# Patient Record
Sex: Female | Born: 1935
Health system: Southern US, Community
[De-identification: ages and names within clinical notes are randomized; demographics above are authoritative.]

## PROBLEM LIST (undated history)

## (undated) DIAGNOSIS — K219 Gastro-esophageal reflux disease without esophagitis: Secondary | ICD-10-CM

## (undated) DIAGNOSIS — R112 Nausea with vomiting, unspecified: Secondary | ICD-10-CM

## (undated) DIAGNOSIS — I4891 Unspecified atrial fibrillation: Secondary | ICD-10-CM

## (undated) DIAGNOSIS — N289 Disorder of kidney and ureter, unspecified: Secondary | ICD-10-CM

## (undated) DIAGNOSIS — C50919 Malignant neoplasm of unspecified site of unspecified female breast: Secondary | ICD-10-CM

## (undated) DIAGNOSIS — K625 Hemorrhage of anus and rectum: Secondary | ICD-10-CM

## (undated) DIAGNOSIS — I499 Cardiac arrhythmia, unspecified: Secondary | ICD-10-CM

## (undated) DIAGNOSIS — E785 Hyperlipidemia, unspecified: Secondary | ICD-10-CM

## (undated) DIAGNOSIS — Z7901 Long term (current) use of anticoagulants: Secondary | ICD-10-CM

## (undated) DIAGNOSIS — I509 Heart failure, unspecified: Secondary | ICD-10-CM

## (undated) DIAGNOSIS — G629 Polyneuropathy, unspecified: Secondary | ICD-10-CM

## (undated) DIAGNOSIS — A809 Acute poliomyelitis, unspecified: Secondary | ICD-10-CM

## (undated) DIAGNOSIS — F419 Anxiety disorder, unspecified: Secondary | ICD-10-CM

## (undated) DIAGNOSIS — I10 Essential (primary) hypertension: Secondary | ICD-10-CM

## (undated) DIAGNOSIS — IMO0001 Reserved for inherently not codable concepts without codable children: Secondary | ICD-10-CM

## (undated) DIAGNOSIS — Z9889 Other specified postprocedural states: Secondary | ICD-10-CM

## (undated) DIAGNOSIS — I2510 Atherosclerotic heart disease of native coronary artery without angina pectoris: Secondary | ICD-10-CM

## (undated) HISTORY — DX: Polyneuropathy, unspecified: G62.9

## (undated) HISTORY — DX: Unspecified atrial fibrillation: I48.91

## (undated) HISTORY — DX: Hemorrhage of anus and rectum: K62.5

## (undated) HISTORY — DX: Essential (primary) hypertension: I10

## (undated) HISTORY — PX: DILATION AND CURETTAGE OF UTERUS: SHX78

## (undated) HISTORY — PX: EYE SURGERY: SHX253

## (undated) HISTORY — PX: PORT-A-CATH REMOVAL: SHX5289

## (undated) HISTORY — DX: Gastro-esophageal reflux disease without esophagitis: K21.9

## (undated) HISTORY — DX: Atherosclerotic heart disease of native coronary artery without angina pectoris: I25.10

## (undated) HISTORY — DX: Anxiety disorder, unspecified: F41.9

## (undated) HISTORY — DX: Acute poliomyelitis, unspecified: A80.9

## (undated) HISTORY — DX: Malignant neoplasm of unspecified site of unspecified female breast: C50.919

## (undated) HISTORY — PX: BREAST SURGERY: SHX581

## (undated) HISTORY — DX: Hyperlipidemia, unspecified: E78.5

## (undated) HISTORY — DX: Long term (current) use of anticoagulants: Z79.01

---
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
## 1997-07-10 HISTORY — PX: CHOLECYSTECTOMY: SHX55

## 1997-10-27 ENCOUNTER — Other Ambulatory Visit: Admission: RE | Admit: 1997-10-27 | Discharge: 1997-10-27 | Payer: Self-pay | Admitting: Obstetrics and Gynecology

## 1997-12-24 ENCOUNTER — Observation Stay (HOSPITAL_COMMUNITY): Admission: RE | Admit: 1997-12-24 | Discharge: 1997-12-25 | Payer: Self-pay | Admitting: Surgery

## 1998-03-17 ENCOUNTER — Other Ambulatory Visit: Admission: RE | Admit: 1998-03-17 | Discharge: 1998-03-17 | Payer: Self-pay | Admitting: Urology

## 1998-03-30 ENCOUNTER — Ambulatory Visit (HOSPITAL_COMMUNITY): Admission: RE | Admit: 1998-03-30 | Discharge: 1998-03-30 | Payer: Self-pay | Admitting: Urology

## 1998-11-04 ENCOUNTER — Other Ambulatory Visit: Admission: RE | Admit: 1998-11-04 | Discharge: 1998-11-04 | Payer: Self-pay | Admitting: Obstetrics and Gynecology

## 1999-04-19 ENCOUNTER — Encounter: Admission: RE | Admit: 1999-04-19 | Discharge: 1999-04-19 | Payer: Self-pay | Admitting: Family Medicine

## 1999-07-06 ENCOUNTER — Encounter: Payer: Self-pay | Admitting: Internal Medicine

## 1999-07-06 ENCOUNTER — Ambulatory Visit (HOSPITAL_COMMUNITY): Admission: RE | Admit: 1999-07-06 | Discharge: 1999-07-06 | Payer: Self-pay | Admitting: Internal Medicine

## 1999-07-11 DIAGNOSIS — C50919 Malignant neoplasm of unspecified site of unspecified female breast: Secondary | ICD-10-CM

## 1999-07-11 HISTORY — PX: MASTECTOMY: SHX3

## 1999-07-11 HISTORY — DX: Malignant neoplasm of unspecified site of unspecified female breast: C50.919

## 1999-11-02 ENCOUNTER — Other Ambulatory Visit: Admission: RE | Admit: 1999-11-02 | Discharge: 1999-11-02 | Payer: Self-pay | Admitting: Obstetrics and Gynecology

## 2000-04-05 ENCOUNTER — Other Ambulatory Visit: Admission: RE | Admit: 2000-04-05 | Discharge: 2000-04-05 | Payer: Self-pay | Admitting: Surgery

## 2000-04-09 ENCOUNTER — Encounter: Payer: Self-pay | Admitting: Surgery

## 2000-04-09 ENCOUNTER — Encounter: Admission: RE | Admit: 2000-04-09 | Discharge: 2000-04-09 | Payer: Self-pay | Admitting: Surgery

## 2000-04-11 ENCOUNTER — Encounter: Admission: RE | Admit: 2000-04-11 | Discharge: 2000-04-11 | Payer: Self-pay | Admitting: Surgery

## 2000-04-11 ENCOUNTER — Encounter: Payer: Self-pay | Admitting: Surgery

## 2000-04-20 ENCOUNTER — Encounter: Payer: Self-pay | Admitting: Surgery

## 2000-04-20 ENCOUNTER — Ambulatory Visit (HOSPITAL_COMMUNITY): Admission: RE | Admit: 2000-04-20 | Discharge: 2000-04-20 | Payer: Self-pay | Admitting: Surgery

## 2000-04-26 ENCOUNTER — Ambulatory Visit (HOSPITAL_COMMUNITY): Admission: RE | Admit: 2000-04-26 | Discharge: 2000-04-26 | Payer: Self-pay | Admitting: Gastroenterology

## 2000-04-26 ENCOUNTER — Encounter (INDEPENDENT_AMBULATORY_CARE_PROVIDER_SITE_OTHER): Payer: Self-pay | Admitting: Specialist

## 2000-05-01 ENCOUNTER — Encounter: Admission: RE | Admit: 2000-05-01 | Discharge: 2000-05-01 | Payer: Self-pay | Admitting: Surgery

## 2000-05-01 ENCOUNTER — Encounter: Payer: Self-pay | Admitting: Surgery

## 2000-05-03 ENCOUNTER — Encounter: Admission: RE | Admit: 2000-05-03 | Discharge: 2000-05-03 | Payer: Self-pay | Admitting: Surgery

## 2000-05-03 ENCOUNTER — Encounter (INDEPENDENT_AMBULATORY_CARE_PROVIDER_SITE_OTHER): Payer: Self-pay | Admitting: *Deleted

## 2000-05-03 ENCOUNTER — Other Ambulatory Visit: Admission: RE | Admit: 2000-05-03 | Discharge: 2000-05-03 | Payer: Self-pay | Admitting: Surgery

## 2000-05-03 ENCOUNTER — Encounter: Payer: Self-pay | Admitting: Surgery

## 2000-05-18 ENCOUNTER — Other Ambulatory Visit: Admission: RE | Admit: 2000-05-18 | Discharge: 2000-05-18 | Payer: Self-pay | Admitting: Surgery

## 2000-05-18 ENCOUNTER — Encounter (INDEPENDENT_AMBULATORY_CARE_PROVIDER_SITE_OTHER): Payer: Self-pay | Admitting: Specialist

## 2000-05-18 ENCOUNTER — Encounter: Admission: RE | Admit: 2000-05-18 | Discharge: 2000-05-18 | Payer: Self-pay | Admitting: Surgery

## 2000-05-18 ENCOUNTER — Encounter: Payer: Self-pay | Admitting: Surgery

## 2000-05-23 ENCOUNTER — Encounter: Payer: Self-pay | Admitting: Surgery

## 2000-05-25 ENCOUNTER — Inpatient Hospital Stay (HOSPITAL_COMMUNITY): Admission: RE | Admit: 2000-05-25 | Discharge: 2000-05-27 | Payer: Self-pay | Admitting: Surgery

## 2000-05-25 ENCOUNTER — Encounter (INDEPENDENT_AMBULATORY_CARE_PROVIDER_SITE_OTHER): Payer: Self-pay | Admitting: Specialist

## 2000-06-12 ENCOUNTER — Ambulatory Visit (HOSPITAL_COMMUNITY): Admission: RE | Admit: 2000-06-12 | Discharge: 2000-06-12 | Payer: Self-pay | Admitting: *Deleted

## 2000-06-12 ENCOUNTER — Encounter: Payer: Self-pay | Admitting: *Deleted

## 2000-06-13 ENCOUNTER — Encounter: Admission: RE | Admit: 2000-06-13 | Discharge: 2000-06-13 | Payer: Self-pay | Admitting: *Deleted

## 2000-06-13 ENCOUNTER — Encounter: Payer: Self-pay | Admitting: *Deleted

## 2000-07-04 ENCOUNTER — Encounter: Payer: Self-pay | Admitting: Surgery

## 2000-07-04 ENCOUNTER — Ambulatory Visit (HOSPITAL_COMMUNITY): Admission: RE | Admit: 2000-07-04 | Discharge: 2000-07-04 | Payer: Self-pay | Admitting: Surgery

## 2000-08-28 ENCOUNTER — Encounter: Payer: Self-pay | Admitting: *Deleted

## 2000-08-28 ENCOUNTER — Encounter: Admission: RE | Admit: 2000-08-28 | Discharge: 2000-08-28 | Payer: Self-pay | Admitting: *Deleted

## 2000-09-04 ENCOUNTER — Encounter (HOSPITAL_COMMUNITY): Admission: RE | Admit: 2000-09-04 | Discharge: 2000-12-03 | Payer: Self-pay | Admitting: *Deleted

## 2000-10-25 ENCOUNTER — Encounter: Admission: RE | Admit: 2000-10-25 | Discharge: 2001-01-23 | Payer: Self-pay | Admitting: Radiation Oncology

## 2001-02-01 ENCOUNTER — Ambulatory Visit (HOSPITAL_BASED_OUTPATIENT_CLINIC_OR_DEPARTMENT_OTHER): Admission: RE | Admit: 2001-02-01 | Discharge: 2001-02-01 | Payer: Self-pay | Admitting: Surgery

## 2001-04-17 ENCOUNTER — Encounter: Payer: Self-pay | Admitting: *Deleted

## 2001-04-17 ENCOUNTER — Encounter: Admission: RE | Admit: 2001-04-17 | Discharge: 2001-04-17 | Payer: Self-pay | Admitting: *Deleted

## 2001-09-13 ENCOUNTER — Encounter: Payer: Self-pay | Admitting: *Deleted

## 2001-09-13 ENCOUNTER — Ambulatory Visit (HOSPITAL_COMMUNITY): Admission: RE | Admit: 2001-09-13 | Discharge: 2001-09-13 | Payer: Self-pay | Admitting: *Deleted

## 2001-12-04 ENCOUNTER — Other Ambulatory Visit: Admission: RE | Admit: 2001-12-04 | Discharge: 2001-12-04 | Payer: Self-pay | Admitting: Obstetrics and Gynecology

## 2002-02-27 ENCOUNTER — Encounter: Payer: Self-pay | Admitting: Radiation Oncology

## 2002-02-27 ENCOUNTER — Ambulatory Visit (HOSPITAL_COMMUNITY): Admission: RE | Admit: 2002-02-27 | Discharge: 2002-02-27 | Payer: Self-pay | Admitting: Radiation Oncology

## 2002-03-11 ENCOUNTER — Ambulatory Visit (HOSPITAL_COMMUNITY): Admission: RE | Admit: 2002-03-11 | Discharge: 2002-03-11 | Payer: Self-pay | Admitting: *Deleted

## 2002-03-11 ENCOUNTER — Encounter: Payer: Self-pay | Admitting: *Deleted

## 2002-03-21 ENCOUNTER — Encounter: Payer: Self-pay | Admitting: *Deleted

## 2002-03-21 ENCOUNTER — Ambulatory Visit (HOSPITAL_COMMUNITY): Admission: RE | Admit: 2002-03-21 | Discharge: 2002-03-21 | Payer: Self-pay | Admitting: *Deleted

## 2002-04-14 ENCOUNTER — Encounter: Admission: RE | Admit: 2002-04-14 | Discharge: 2002-04-14 | Payer: Self-pay | Admitting: *Deleted

## 2002-04-14 ENCOUNTER — Encounter: Payer: Self-pay | Admitting: *Deleted

## 2003-04-22 ENCOUNTER — Encounter: Payer: Self-pay | Admitting: Oncology

## 2003-04-22 ENCOUNTER — Encounter: Admission: RE | Admit: 2003-04-22 | Discharge: 2003-04-22 | Payer: Self-pay | Admitting: Oncology

## 2003-05-05 ENCOUNTER — Ambulatory Visit (HOSPITAL_COMMUNITY): Admission: RE | Admit: 2003-05-05 | Discharge: 2003-05-05 | Payer: Self-pay | Admitting: Oncology

## 2003-09-29 ENCOUNTER — Other Ambulatory Visit: Admission: RE | Admit: 2003-09-29 | Discharge: 2003-09-29 | Payer: Self-pay | Admitting: Obstetrics and Gynecology

## 2003-10-21 ENCOUNTER — Ambulatory Visit (HOSPITAL_COMMUNITY): Admission: RE | Admit: 2003-10-21 | Discharge: 2003-10-21 | Payer: Self-pay | Admitting: Gastroenterology

## 2003-10-21 ENCOUNTER — Encounter (INDEPENDENT_AMBULATORY_CARE_PROVIDER_SITE_OTHER): Payer: Self-pay | Admitting: *Deleted

## 2004-04-22 ENCOUNTER — Encounter: Admission: RE | Admit: 2004-04-22 | Discharge: 2004-04-22 | Payer: Self-pay | Admitting: Oncology

## 2004-07-07 ENCOUNTER — Ambulatory Visit: Payer: Self-pay | Admitting: Internal Medicine

## 2004-08-30 ENCOUNTER — Ambulatory Visit: Payer: Self-pay | Admitting: Internal Medicine

## 2004-09-07 ENCOUNTER — Ambulatory Visit: Payer: Self-pay | Admitting: Internal Medicine

## 2004-09-13 ENCOUNTER — Ambulatory Visit: Payer: Self-pay | Admitting: Internal Medicine

## 2004-10-24 ENCOUNTER — Ambulatory Visit: Payer: Self-pay | Admitting: Internal Medicine

## 2004-10-28 ENCOUNTER — Ambulatory Visit: Payer: Self-pay | Admitting: Oncology

## 2004-11-02 ENCOUNTER — Encounter: Admission: RE | Admit: 2004-11-02 | Discharge: 2004-11-02 | Payer: Self-pay | Admitting: Surgery

## 2004-11-04 ENCOUNTER — Ambulatory Visit: Payer: Self-pay | Admitting: Internal Medicine

## 2004-11-10 ENCOUNTER — Encounter: Admission: RE | Admit: 2004-11-10 | Discharge: 2004-11-10 | Payer: Self-pay | Admitting: Surgery

## 2004-12-19 ENCOUNTER — Ambulatory Visit: Payer: Self-pay | Admitting: Oncology

## 2004-12-26 ENCOUNTER — Other Ambulatory Visit: Admission: RE | Admit: 2004-12-26 | Discharge: 2004-12-26 | Payer: Self-pay | Admitting: Dermatology

## 2005-06-14 ENCOUNTER — Ambulatory Visit: Payer: Self-pay | Admitting: Oncology

## 2005-06-14 ENCOUNTER — Ambulatory Visit: Payer: Self-pay | Admitting: Internal Medicine

## 2005-07-04 ENCOUNTER — Ambulatory Visit: Payer: Self-pay | Admitting: Internal Medicine

## 2005-07-20 ENCOUNTER — Ambulatory Visit (HOSPITAL_BASED_OUTPATIENT_CLINIC_OR_DEPARTMENT_OTHER): Admission: RE | Admit: 2005-07-20 | Discharge: 2005-07-20 | Payer: Self-pay | Admitting: Surgery

## 2005-07-20 ENCOUNTER — Encounter (INDEPENDENT_AMBULATORY_CARE_PROVIDER_SITE_OTHER): Payer: Self-pay | Admitting: *Deleted

## 2005-08-15 ENCOUNTER — Ambulatory Visit: Payer: Self-pay | Admitting: Oncology

## 2005-08-29 ENCOUNTER — Ambulatory Visit: Payer: Self-pay | Admitting: Cardiology

## 2005-08-29 ENCOUNTER — Ambulatory Visit: Payer: Self-pay | Admitting: Internal Medicine

## 2005-08-29 ENCOUNTER — Inpatient Hospital Stay (HOSPITAL_COMMUNITY): Admission: EM | Admit: 2005-08-29 | Discharge: 2005-09-01 | Payer: Self-pay | Admitting: *Deleted

## 2005-09-13 ENCOUNTER — Ambulatory Visit: Payer: Self-pay | Admitting: Internal Medicine

## 2005-09-14 ENCOUNTER — Ambulatory Visit: Payer: Self-pay | Admitting: Internal Medicine

## 2005-10-20 ENCOUNTER — Other Ambulatory Visit: Admission: RE | Admit: 2005-10-20 | Discharge: 2005-10-20 | Payer: Self-pay | Admitting: Obstetrics and Gynecology

## 2005-11-06 ENCOUNTER — Encounter: Admission: RE | Admit: 2005-11-06 | Discharge: 2005-11-06 | Payer: Self-pay | Admitting: Oncology

## 2005-11-07 ENCOUNTER — Encounter (INDEPENDENT_AMBULATORY_CARE_PROVIDER_SITE_OTHER): Payer: Self-pay | Admitting: *Deleted

## 2005-11-07 LAB — CONVERTED CEMR LAB

## 2005-11-09 ENCOUNTER — Encounter: Admission: RE | Admit: 2005-11-09 | Discharge: 2005-11-09 | Payer: Self-pay | Admitting: Oncology

## 2006-02-06 ENCOUNTER — Ambulatory Visit: Payer: Self-pay | Admitting: Oncology

## 2006-02-07 LAB — COMPREHENSIVE METABOLIC PANEL
AST: 21 U/L (ref 0–37)
Alkaline Phosphatase: 99 U/L (ref 39–117)
BUN: 20 mg/dL (ref 6–23)
Calcium: 9.6 mg/dL (ref 8.4–10.5)
Chloride: 103 mEq/L (ref 96–112)
Creatinine, Ser: 0.85 mg/dL (ref 0.40–1.20)

## 2006-02-07 LAB — CBC WITH DIFFERENTIAL/PLATELET
Basophils Absolute: 0 10*3/uL (ref 0.0–0.1)
EOS%: 1.9 % (ref 0.0–7.0)
HCT: 40.1 % (ref 34.8–46.6)
HGB: 13.6 g/dL (ref 11.6–15.9)
MCH: 28.7 pg (ref 26.0–34.0)
MCV: 84.9 fL (ref 81.0–101.0)
MONO%: 8.7 % (ref 0.0–13.0)
NEUT%: 53.5 % (ref 39.6–76.8)

## 2006-03-21 ENCOUNTER — Ambulatory Visit: Payer: Self-pay | Admitting: Internal Medicine

## 2006-03-24 ENCOUNTER — Ambulatory Visit (HOSPITAL_COMMUNITY): Admission: RE | Admit: 2006-03-24 | Discharge: 2006-03-24 | Payer: Self-pay | Admitting: Internal Medicine

## 2006-09-24 ENCOUNTER — Ambulatory Visit: Payer: Self-pay | Admitting: Internal Medicine

## 2006-09-24 LAB — CONVERTED CEMR LAB
ALT: 16 units/L (ref 0–40)
AST: 25 units/L (ref 0–37)
BUN: 12 mg/dL (ref 6–23)
Basophils Absolute: 0.1 10*3/uL (ref 0.0–0.1)
Basophils Relative: 1.1 % — ABNORMAL HIGH (ref 0.0–1.0)
CO2: 30 meq/L (ref 19–32)
Calcium: 9.4 mg/dL (ref 8.4–10.5)
Chloride: 100 meq/L (ref 96–112)
Cholesterol: 248 mg/dL (ref 0–200)
Creatinine, Ser: 0.7 mg/dL (ref 0.4–1.2)
Direct LDL: 144.6 mg/dL
Eosinophils Absolute: 0.1 10*3/uL (ref 0.0–0.6)
Eosinophils Relative: 1.5 % (ref 0.0–5.0)
GFR calc Af Amer: 106 mL/min
GFR calc non Af Amer: 87 mL/min
Glucose, Bld: 95 mg/dL (ref 70–99)
HCT: 42.5 % (ref 36.0–46.0)
HDL: 66.7 mg/dL (ref >39.0)
Hemoglobin: 14.4 g/dL (ref 12.0–15.0)
Lymphocytes Relative: 34 % (ref 12.0–46.0)
MCHC: 33.9 g/dL (ref 30.0–36.0)
MCV: 83.5 fL (ref 78.0–100.0)
Monocytes Absolute: 0.6 10*3/uL (ref 0.2–0.7)
Monocytes Relative: 8.1 % (ref 3.0–11.0)
Neutro Abs: 4.1 10*3/uL (ref 1.4–7.7)
Neutrophils Relative %: 55.3 % (ref 43.0–77.0)
Platelets: 310 10*3/uL (ref 150–400)
Potassium: 3.8 meq/L (ref 3.5–5.1)
RBC: 5.1 M/uL (ref 3.87–5.11)
RDW: 13.3 % (ref 11.5–14.6)
Sodium: 138 meq/L (ref 135–145)
TSH: 2.36 microintl units/mL (ref 0.35–5.50)
Total CHOL/HDL Ratio: 3.7
Triglycerides: 242 mg/dL (ref 0–149)
VLDL: 48 mg/dL — ABNORMAL HIGH (ref 0–40)
WBC: 7.4 10*3/uL (ref 4.5–10.5)

## 2006-09-27 ENCOUNTER — Ambulatory Visit: Payer: Self-pay | Admitting: Oncology

## 2006-11-12 ENCOUNTER — Encounter: Admission: RE | Admit: 2006-11-12 | Discharge: 2006-11-12 | Payer: Self-pay | Admitting: Oncology

## 2007-07-24 ENCOUNTER — Encounter: Payer: Self-pay | Admitting: *Deleted

## 2007-07-24 DIAGNOSIS — Z9089 Acquired absence of other organs: Secondary | ICD-10-CM | POA: Insufficient documentation

## 2007-07-24 DIAGNOSIS — G609 Hereditary and idiopathic neuropathy, unspecified: Secondary | ICD-10-CM | POA: Insufficient documentation

## 2007-07-24 DIAGNOSIS — Z9889 Other specified postprocedural states: Secondary | ICD-10-CM | POA: Insufficient documentation

## 2007-07-24 DIAGNOSIS — Z8719 Personal history of other diseases of the digestive system: Secondary | ICD-10-CM | POA: Insufficient documentation

## 2007-07-24 DIAGNOSIS — F411 Generalized anxiety disorder: Secondary | ICD-10-CM | POA: Insufficient documentation

## 2007-08-19 ENCOUNTER — Ambulatory Visit: Payer: Self-pay | Admitting: Internal Medicine

## 2007-09-27 ENCOUNTER — Ambulatory Visit: Payer: Self-pay | Admitting: Oncology

## 2007-10-01 ENCOUNTER — Encounter: Payer: Self-pay | Admitting: Internal Medicine

## 2007-10-01 LAB — CBC WITH DIFFERENTIAL/PLATELET
BASO%: 0.6 % (ref 0.0–2.0)
EOS%: 2.4 % (ref 0.0–7.0)
HCT: 41 % (ref 34.8–46.6)
LYMPH%: 33.1 % (ref 14.0–48.0)
MCH: 29.7 pg (ref 26.0–34.0)
MCHC: 34.9 g/dL (ref 32.0–36.0)
NEUT%: 56.6 % (ref 39.6–76.8)
lymph#: 2.5 10*3/uL (ref 0.9–3.3)

## 2007-10-01 LAB — COMPREHENSIVE METABOLIC PANEL
ALT: 12 U/L (ref 0–35)
AST: 16 U/L (ref 0–37)
Creatinine, Ser: 0.8 mg/dL (ref 0.40–1.20)
Total Bilirubin: 0.5 mg/dL (ref 0.3–1.2)

## 2007-10-07 ENCOUNTER — Encounter: Payer: Self-pay | Admitting: Internal Medicine

## 2007-11-11 ENCOUNTER — Ambulatory Visit: Payer: Self-pay | Admitting: Internal Medicine

## 2007-11-11 DIAGNOSIS — R002 Palpitations: Secondary | ICD-10-CM | POA: Insufficient documentation

## 2007-11-11 LAB — CONVERTED CEMR LAB
Cholesterol: 233 mg/dL (ref 0–200)
Direct LDL: 152.2 mg/dL
HDL: 54.4 mg/dL (ref >39.0)
Total CHOL/HDL Ratio: 4.3
Triglycerides: 166 mg/dL — ABNORMAL HIGH (ref 0–149)
VLDL: 33 mg/dL (ref 0–40)

## 2007-11-12 ENCOUNTER — Encounter: Payer: Self-pay | Admitting: Internal Medicine

## 2007-11-13 ENCOUNTER — Ambulatory Visit: Payer: Self-pay

## 2007-11-14 ENCOUNTER — Encounter: Admission: RE | Admit: 2007-11-14 | Discharge: 2007-11-14 | Payer: Self-pay | Admitting: Oncology

## 2007-11-20 ENCOUNTER — Encounter: Payer: Self-pay | Admitting: Internal Medicine

## 2007-11-20 ENCOUNTER — Encounter: Admission: RE | Admit: 2007-11-20 | Discharge: 2007-11-20 | Payer: Self-pay | Admitting: Oncology

## 2007-11-26 ENCOUNTER — Ambulatory Visit: Payer: Self-pay | Admitting: Internal Medicine

## 2007-12-04 ENCOUNTER — Encounter: Payer: Self-pay | Admitting: Internal Medicine

## 2007-12-04 ENCOUNTER — Telehealth: Payer: Self-pay | Admitting: Internal Medicine

## 2007-12-06 ENCOUNTER — Encounter: Payer: Self-pay | Admitting: Internal Medicine

## 2007-12-06 ENCOUNTER — Telehealth: Payer: Self-pay | Admitting: Internal Medicine

## 2007-12-09 ENCOUNTER — Ambulatory Visit: Payer: Self-pay | Admitting: Internal Medicine

## 2007-12-11 ENCOUNTER — Encounter (INDEPENDENT_AMBULATORY_CARE_PROVIDER_SITE_OTHER): Payer: Self-pay | Admitting: *Deleted

## 2007-12-12 ENCOUNTER — Encounter: Payer: Self-pay | Admitting: Internal Medicine

## 2007-12-13 ENCOUNTER — Telehealth: Payer: Self-pay | Admitting: Internal Medicine

## 2007-12-13 ENCOUNTER — Encounter: Payer: Self-pay | Admitting: Internal Medicine

## 2008-01-08 ENCOUNTER — Ambulatory Visit: Payer: Self-pay | Admitting: Internal Medicine

## 2008-01-25 ENCOUNTER — Inpatient Hospital Stay (HOSPITAL_COMMUNITY): Admission: EM | Admit: 2008-01-25 | Discharge: 2008-01-26 | Payer: Self-pay | Admitting: Emergency Medicine

## 2008-01-25 ENCOUNTER — Ambulatory Visit: Payer: Self-pay | Admitting: Cardiology

## 2008-02-14 ENCOUNTER — Ambulatory Visit: Payer: Self-pay | Admitting: Internal Medicine

## 2008-04-15 ENCOUNTER — Telehealth: Payer: Self-pay | Admitting: Internal Medicine

## 2008-05-18 ENCOUNTER — Ambulatory Visit: Payer: Self-pay | Admitting: Internal Medicine

## 2008-05-18 ENCOUNTER — Ambulatory Visit: Payer: Self-pay | Admitting: Cardiology

## 2008-07-27 ENCOUNTER — Encounter: Payer: Self-pay | Admitting: Internal Medicine

## 2008-08-11 ENCOUNTER — Telehealth: Payer: Self-pay | Admitting: Internal Medicine

## 2008-08-12 ENCOUNTER — Encounter: Payer: Self-pay | Admitting: Internal Medicine

## 2008-08-27 ENCOUNTER — Encounter: Payer: Self-pay | Admitting: Internal Medicine

## 2008-11-12 ENCOUNTER — Ambulatory Visit: Payer: Self-pay | Admitting: Internal Medicine

## 2008-11-12 ENCOUNTER — Ambulatory Visit: Payer: Self-pay | Admitting: Oncology

## 2008-11-12 DIAGNOSIS — B029 Zoster without complications: Secondary | ICD-10-CM | POA: Insufficient documentation

## 2008-11-12 DIAGNOSIS — R300 Dysuria: Secondary | ICD-10-CM | POA: Insufficient documentation

## 2008-11-12 LAB — CONVERTED CEMR LAB
ALT: 16 units/L (ref 0–35)
AST: 22 units/L (ref 0–37)
Albumin: 4 g/dL (ref 3.5–5.2)
Alkaline Phosphatase: 98 units/L (ref 39–117)
BUN: 21 mg/dL (ref 6–23)
Basophils Absolute: 0.1 10*3/uL (ref 0.0–0.1)
Basophils Relative: 2.2 % (ref 0.0–3.0)
Bilirubin Urine: NEGATIVE
Bilirubin, Direct: 0.2 mg/dL (ref 0.0–0.3)
CO2: 31 meq/L (ref 19–32)
Calcium: 9.6 mg/dL (ref 8.4–10.5)
Chloride: 102 meq/L (ref 96–112)
Cholesterol: 282 mg/dL — ABNORMAL HIGH (ref 0–200)
Creatinine, Ser: 0.9 mg/dL (ref 0.4–1.2)
Direct LDL: 201.5 mg/dL
Eosinophils Absolute: 0.1 10*3/uL (ref 0.0–0.7)
Eosinophils Relative: 1.4 % (ref 0.0–5.0)
Folate: >20 ng/mL
GFR calc non Af Amer: 64.9 mL/min (ref >60)
Glucose, Bld: 103 mg/dL — ABNORMAL HIGH (ref 70–99)
HCT: 41.7 % (ref 36.0–46.0)
HDL: 64.6 mg/dL (ref >39.00)
Hemoglobin, Urine: NEGATIVE
Hemoglobin: 14.6 g/dL (ref 12.0–15.0)
Ketones, ur: NEGATIVE mg/dL
Lymphocytes Relative: 30.9 % (ref 12.0–46.0)
Lymphs Abs: 2.1 10*3/uL (ref 0.7–4.0)
MCHC: 35 g/dL (ref 30.0–36.0)
MCV: 86 fL (ref 78.0–100.0)
Monocytes Absolute: 0.7 10*3/uL (ref 0.1–1.0)
Monocytes Relative: 9.8 % (ref 3.0–12.0)
Neutro Abs: 3.7 10*3/uL (ref 1.4–7.7)
Neutrophils Relative %: 55.7 % (ref 43.0–77.0)
Nitrite: NEGATIVE
Platelets: 282 10*3/uL (ref 150.0–400.0)
Potassium: 4.2 meq/L (ref 3.5–5.1)
RBC: 4.86 M/uL (ref 3.87–5.11)
RDW: 13.1 % (ref 11.5–14.6)
Sodium: 141 meq/L (ref 135–145)
Specific Gravity, Urine: >=1.03 (ref 1.000–1.030)
TSH: 2.1 microintl units/mL (ref 0.35–5.50)
Total Bilirubin: 1.2 mg/dL (ref 0.3–1.2)
Total CHOL/HDL Ratio: 4
Total Protein, Urine: NEGATIVE mg/dL
Total Protein: 7.5 g/dL (ref 6.0–8.3)
Triglycerides: 176 mg/dL — ABNORMAL HIGH (ref 0.0–149.0)
Urine Glucose: NEGATIVE mg/dL
Urobilinogen, UA: 0.2 (ref 0.0–1.0)
VLDL: 35.2 mg/dL (ref 0.0–40.0)
Vitamin B-12: 509 pg/mL (ref 211–911)
WBC: 6.7 10*3/uL (ref 4.5–10.5)
pH: 5 (ref 5.0–8.0)

## 2008-11-13 ENCOUNTER — Telehealth: Payer: Self-pay | Admitting: Internal Medicine

## 2008-11-16 ENCOUNTER — Encounter: Admission: RE | Admit: 2008-11-16 | Discharge: 2008-11-16 | Payer: Self-pay | Admitting: Oncology

## 2008-11-16 ENCOUNTER — Encounter: Payer: Self-pay | Admitting: Internal Medicine

## 2008-11-16 LAB — COMPREHENSIVE METABOLIC PANEL
ALT: 12 U/L (ref 0–35)
Albumin: 4 g/dL (ref 3.5–5.2)
CO2: 25 mEq/L (ref 19–32)
Chloride: 104 mEq/L (ref 96–112)
Potassium: 4.3 mEq/L (ref 3.5–5.3)
Sodium: 139 mEq/L (ref 135–145)
Total Bilirubin: 0.4 mg/dL (ref 0.3–1.2)
Total Protein: 6.7 g/dL (ref 6.0–8.3)

## 2008-11-16 LAB — CBC WITH DIFFERENTIAL/PLATELET
BASO%: 0.6 % (ref 0.0–2.0)
LYMPH%: 33.5 % (ref 14.0–49.7)
MCHC: 34.1 g/dL (ref 31.5–36.0)
MONO#: 0.5 10*3/uL (ref 0.1–0.9)
RBC: 4.65 10*6/uL (ref 3.70–5.45)
RDW: 14 % (ref 11.2–14.5)
WBC: 6.3 10*3/uL (ref 3.9–10.3)
lymph#: 2.1 10*3/uL (ref 0.9–3.3)

## 2008-11-16 LAB — LACTATE DEHYDROGENASE: LDH: 156 U/L (ref 94–250)

## 2008-12-01 ENCOUNTER — Encounter: Payer: Self-pay | Admitting: Internal Medicine

## 2009-01-13 ENCOUNTER — Encounter: Payer: Self-pay | Admitting: Internal Medicine

## 2009-01-13 ENCOUNTER — Ambulatory Visit: Payer: Self-pay | Admitting: Internal Medicine

## 2009-11-03 ENCOUNTER — Ambulatory Visit: Payer: Self-pay | Admitting: Internal Medicine

## 2009-11-22 ENCOUNTER — Ambulatory Visit: Payer: Self-pay | Admitting: Oncology

## 2009-11-24 ENCOUNTER — Encounter: Admission: RE | Admit: 2009-11-24 | Discharge: 2009-11-24 | Payer: Self-pay | Admitting: Oncology

## 2009-11-24 ENCOUNTER — Ambulatory Visit: Payer: Self-pay | Admitting: Internal Medicine

## 2009-11-24 LAB — CBC WITH DIFFERENTIAL/PLATELET
BASO%: 0.7 % (ref 0.0–2.0)
HCT: 43.5 % (ref 34.8–46.6)
MCHC: 32.8 g/dL (ref 31.5–36.0)
MONO#: 0.5 10*3/uL (ref 0.1–0.9)
NEUT%: 55.3 % (ref 38.4–76.8)
WBC: 5.7 10*3/uL (ref 3.9–10.3)
lymph#: 2 10*3/uL (ref 0.9–3.3)

## 2009-11-24 LAB — COMPREHENSIVE METABOLIC PANEL
ALT: 13 U/L (ref 0–35)
Albumin: 4.2 g/dL (ref 3.5–5.2)
CO2: 28 mEq/L (ref 19–32)
Calcium: 9.5 mg/dL (ref 8.4–10.5)
Chloride: 102 mEq/L (ref 96–112)
Creatinine, Ser: 0.8 mg/dL (ref 0.40–1.20)
Sodium: 140 mEq/L (ref 135–145)
Total Protein: 6.8 g/dL (ref 6.0–8.3)

## 2009-11-24 LAB — LACTATE DEHYDROGENASE: LDH: 176 U/L (ref 94–250)

## 2009-11-27 ENCOUNTER — Encounter: Payer: Self-pay | Admitting: Internal Medicine

## 2009-11-30 ENCOUNTER — Encounter: Payer: Self-pay | Admitting: Internal Medicine

## 2010-03-28 ENCOUNTER — Ambulatory Visit: Payer: Self-pay | Admitting: Internal Medicine

## 2010-03-28 ENCOUNTER — Encounter: Payer: Self-pay | Admitting: Internal Medicine

## 2010-03-31 ENCOUNTER — Encounter: Payer: Self-pay | Admitting: Cardiology

## 2010-03-31 ENCOUNTER — Encounter (INDEPENDENT_AMBULATORY_CARE_PROVIDER_SITE_OTHER): Payer: Self-pay | Admitting: *Deleted

## 2010-03-31 ENCOUNTER — Ambulatory Visit: Payer: Self-pay

## 2010-04-04 ENCOUNTER — Telehealth (INDEPENDENT_AMBULATORY_CARE_PROVIDER_SITE_OTHER): Payer: Self-pay

## 2010-04-05 ENCOUNTER — Encounter: Payer: Self-pay | Admitting: Adult Health

## 2010-04-08 ENCOUNTER — Encounter: Payer: Self-pay | Admitting: Cardiology

## 2010-04-08 ENCOUNTER — Ambulatory Visit: Payer: Self-pay | Admitting: Cardiology

## 2010-04-08 ENCOUNTER — Encounter (HOSPITAL_COMMUNITY): Admission: RE | Admit: 2010-04-08 | Discharge: 2010-04-09 | Payer: Self-pay | Admitting: Cardiology

## 2010-04-11 ENCOUNTER — Encounter: Payer: Self-pay | Admitting: Internal Medicine

## 2010-04-11 LAB — CONVERTED CEMR LAB
Basophils Absolute: 0.1 10*3/uL (ref 0.0–0.1)
Basophils Relative: 1 % (ref 0–1)
Eosinophils Absolute: 0.2 10*3/uL (ref 0.0–0.7)
Eosinophils Relative: 2 % (ref 0–5)
HCT: 42.7 % (ref 36.0–46.0)
Hemoglobin: 13.7 g/dL (ref 12.0–15.0)
Lymphocytes Relative: 24 % (ref 12–46)
Lymphs Abs: 1.8 10*3/uL (ref 0.7–4.0)
MCHC: 32.1 g/dL (ref 30.0–36.0)
MCV: 88.4 fL (ref 78.0–100.0)
Monocytes Absolute: 0.7 10*3/uL (ref 0.1–1.0)
Monocytes Relative: 9 % (ref 3–12)
Neutro Abs: 4.8 10*3/uL (ref 1.7–7.7)
Neutrophils Relative %: 64 % (ref 43–77)
Platelets: 346 10*3/uL (ref 150–400)
RBC: 4.83 M/uL (ref 3.87–5.11)
RDW: 13.8 % (ref 11.5–15.5)
WBC: 7.5 10*3/uL (ref 4.0–10.5)

## 2010-04-13 ENCOUNTER — Ambulatory Visit: Payer: Self-pay | Admitting: Cardiology

## 2010-04-14 ENCOUNTER — Telehealth: Payer: Self-pay | Admitting: Internal Medicine

## 2010-04-25 ENCOUNTER — Ambulatory Visit: Payer: Self-pay | Admitting: Internal Medicine

## 2010-05-09 ENCOUNTER — Telehealth (INDEPENDENT_AMBULATORY_CARE_PROVIDER_SITE_OTHER): Payer: Self-pay | Admitting: *Deleted

## 2010-05-31 ENCOUNTER — Ambulatory Visit: Payer: Self-pay | Admitting: Cardiology

## 2010-06-16 ENCOUNTER — Ambulatory Visit: Payer: Self-pay | Admitting: Internal Medicine

## 2010-06-17 ENCOUNTER — Encounter (INDEPENDENT_AMBULATORY_CARE_PROVIDER_SITE_OTHER): Payer: Self-pay | Admitting: *Deleted

## 2010-07-31 ENCOUNTER — Encounter: Payer: Self-pay | Admitting: Oncology

## 2010-08-09 NOTE — Assessment & Plan Note (Signed)
Summary: past due for f/u per pt request/tg   Visit Type:  Pacemaker check Primary Provider:  Norins  CC:  pacer check.  History of Present Illness: [name] returns today for followup of c/p and SVT.  She has a h/o c/p thought secondary to coronary spasm as well as symptomatic SVT.  She states that her spells of SVT and c/p occur about once a month.  She has continued taking verapamil with fairly good control.  No constipation on verapamil. She does note an episode of SVT 3 weeks ago. No syncope.  Preventive Screening-Counseling & Management  Alcohol-Tobacco     Smoking Status: never  Current Medications (verified): 1)  Altace 5 Mg  Caps (Ramipril) .... Take One Capsule Daily 2)  Arimidex 1 Mg  Tabs (Anastrozole) .... Take One Tablet Once Daily 3)  Glucosamine Complex   Tabs (Nutritional Supplements) .... Take One Tablet Once Daily 4)  Welchol 625 Mg  Tabs (Colesevelam Hcl) .... Take 1 Tablet By Mouth Once A Day 5)  Nexium 40 Mg  Cpdr (Esomeprazole Magnesium) .... Take One Tablet Daily 6)  Bl Ibuprofen 200 Mg  Tabs (Ibuprofen) .... Take One Tablet Daily 7)  Alprazolam 0.25 Mg  Tabs (Alprazolam) .... Take One Tablet As Needed 8)  Cvs Aspirin Ec 325 Mg  Tbec (Aspirin) .Marland Kitchen.. 1 By Mouth Once Daily 9)  Verapamil Hcl Cr 180 Mg Xr24h-Cap (Verapamil Hcl) .... Take 1 Tablet By Mouth Once A Day 10)  Fish Oil 1000 Mg Caps (Omega-3 Fatty Acids) .... Take 1 Tablet By Mouth Once A Day 11)  Red Yeast Rice Extract 600 Mg Caps (Red Yeast Rice Extract) .... Take 1-2 Tablet By Mouth Once A Day 12)  Multivitamins  Tabs (Multiple Vitamin) .... Take 1 Tablet By Mouth Once A Day 13)  Vitamin C 500 Mg Tabs (Ascorbic Acid) .... Take 1 Tablet By Mouth Once A Day As Needed 14)  Vitamin D 1000 Unit Tabs (Cholecalciferol) .... Take 1 Tablet By Mouth Once A Day 15)  Nitrostat 0.4 Mg Subl (Nitroglycerin) .Marland Kitchen.. 1 Tablet Under Tongue At Onset of Chest Pain; You May Repeat Every 5 Minutes For Up To 3  Doses.  Allergies (verified): 1)  ! * Pain Med Pt Unsure Of. 2)  ! Hydrocodone  Comments:  Nurse/Medical Assistant: The patient's medications and allergies were reviewed with the patient and were updated in the Medication and Allergy Lists. List reviewed.  Past History:  Past Medical History: Last updated: 11/12/2008  Hx of HERPES ZOSTER (ICD-053.9) PALPITATIONS, RECURRENT (ICD-785.1) CORONARY ARTERY DISEASE (ICD-414.00) HYPERLIPIDEMIA (ICD-272.4) * Hx of POLIO AT AGE 58 PERIPHERAL NEUROPATHY (ICD-356.9) CARPAL TUNNEL RELEASE, HX OF (ICD-V45.89) HIATAL HERNIA, HX OF (ICD-V12.79) ANXIETY (ICD-300.00) HYPERTENSION (ICD-401.9) GERD (ICD-530.81) CARCINOMA, BREAST, HX OF (ICD-V10.3) Coronary artery disease - cath Feb '07- no obstructive dz - 20% proximal LAD only; EF 65% diastolic dysfunction Peripheral neuropathy  Past Surgical History: Last updated: 11/11/2007 MASTECTOMY, RADICAL, WITH AXILLARY LYMPH NODES, HX OF LEFT (ICD-V45.71) CHOLECYSTECTOMY, HX OF (ICD-V45.79) '99    Review of Systems  The patient denies chest pain, syncope, dyspnea on exertion, and peripheral edema.    Vital Signs:  Patient profile:   77 year old female Height:      61 inches Weight:      177 pounds BMI:     33.56 Pulse rate:   73 / minute BP sitting:   111 / 74  (right arm) Cuff size:   large  Vitals Entered By: Roxy Cedar  Nutrition Counseling: Patient's BMI is greater than 25 and therefore counseled on weight management options. CC: pacer check   Physical Exam  General:  Well developed, well nourished, in no acute distress. Head:  normocephalic and atraumatic Eyes:  PERRLA/EOM intact; conjunctiva and lids normal. Mouth:  Teeth, gums and palate normal. Oral mucosa normal. Neck:  Neck supple, no JVD. No masses, thyromegaly or abnormal cervical nodes. Chest Wall:  s/p left mastectomy Lungs:  Clear bilaterally to auscultation with no wheezes or rhonchi. Heart:  RRR with  normal S1 and S2. Abdomen:  Bowel sounds positive; abdomen soft and non-tender without masses, organomegaly, or hernias noted. No hepatosplenomegaly. Msk:  Back normal, normal gait. Muscle strength and tone normal. Pulses:  pulses normal in all 4 extremities Extremities:  No clubbing or cyanosis. Neurologic:  Alert and oriented x 3.   Impression & Recommendations:  Problem # 1:  PALPITATIONS, RECURRENT (ICD-785.1) Her symptoms of SVT are well controlled.  She will continue her current meds. Her updated medication list for this problem includes:    Altace 5 Mg Caps (Ramipril) .Marland Kitchen... Take one capsule daily    Cvs Aspirin Ec 325 Mg Tbec (Aspirin) .Marland Kitchen... 1 by mouth once daily    Verapamil Hcl Cr 180 Mg Xr24h-cap (Verapamil hcl) .Marland Kitchen... Take 1 tablet by mouth once a day    Nitrostat 0.4 Mg Subl (Nitroglycerin) .Marland Kitchen... 1 tablet under tongue at onset of chest pain; you may repeat every 5 minutes for up to 3 doses.  Her updated medication list for this problem includes:    Altace 5 Mg Caps (Ramipril) .Marland Kitchen... Take one capsule daily    Cvs Aspirin Ec 325 Mg Tbec (Aspirin) .Marland Kitchen... 1 by mouth once daily    Verapamil Hcl Cr 180 Mg Xr24h-cap (Verapamil hcl) .Marland Kitchen... Take 1 tablet by mouth once a day    Nitrostat 0.4 Mg Subl (Nitroglycerin) .Marland Kitchen... 1 tablet under tongue at onset of chest pain; you may repeat every 5 minutes for up to 3 doses.  Problem # 2:  HYPERTENSION (ICD-401.9) I have asked that she maintain a low sodium diet. She will continue her meds as noted below. Her updated medication list for this problem includes:    Altace 5 Mg Caps (Ramipril) .Marland Kitchen... Take one capsule daily    Cvs Aspirin Ec 325 Mg Tbec (Aspirin) .Marland Kitchen... 1 by mouth once daily    Verapamil Hcl Cr 180 Mg Xr24h-cap (Verapamil hcl) .Marland Kitchen... Take 1 tablet by mouth once a day  Patient Instructions: 1)  Your physician recommends that you schedule a follow-up appointment in: 6 months 2)  Your physician recommends that you continue on your current  medications as directed. Please refer to the Current Medication list given to you today. 3)  Your physician recommended you take 1 tablet (or 1 spray) under tongue at onset of chest pain; you may repeat every 5 minutes for up to 3 doses. If 3 or more doses are required, call 911 and proceed to the ER immediately. Prescriptions: NITROSTAT 0.4 MG SUBL (NITROGLYCERIN) 1 tablet under tongue at onset of chest pain; you may repeat every 5 minutes for up to 3 doses.  #25 x 3   Entered by:   Larita Fife Via LPN   Authorized by:   Laren Boom, MD, [facility]   Signed by:   Larita Fife Via LPN on 16/04/9603   Method used:   [facility]        [facility] [phone][facility] (retail)  [address]       [place], [place]  [address]       Ph: [phone] or [phone]       Fax: [phone]   RxID:   [id number] VERAPAMIL HCL CR 180 MG XR24H-CAP (VERAPAMIL HCL) Take 1 tablet by mouth once a day  #30 x 6   Entered by:   Larita Fife Via LPN   Authorized by:   Laren Boom, MD, [facility]   Signed by:   Larita Fife Via LPN on 43/15/4008   Method used:   [facility] to        [facility] [phone]* (retail)       [address]       [place], [place]  [address]       Ph: [phone] or [phone]       Fax: [phone]   RxID:   [id number]   Appended Document: past due for f/u per pt request/tg [facility] verapamil [facility] be for 90 days.

## 2010-08-09 NOTE — Letter (Signed)
[facility]   Imported By: Sherian Rein 12/21/2009 09:54:36  _____________________________________________________________________  External Attachment:    Type:   Image     Comment:   External Document

## 2010-08-09 NOTE — Progress Notes (Signed)
Summary: PT HAVING PROBLUMES WITH NEW MEDICATION CHANGES  Phone Note Call from Patient Call back at Home Phone [phone] Call back at [phone]   Caller: PT Reason for Call: Talk to Nurse Summary of Call: LAST TIME PT WAS IN THEY CHANGED HER MEDICATIONS AND IT HAS CAUSED HER TO FEEL "FULL" IN THE NECK AND HEAD. LAST NIGHT SHE GOT NUMB IN FOOT AND UP IN HER RIGHT ARM. SHE HAS ALSO BEEN SLIGHTLY SOB AND LIGHTHEADED. Initial call taken by: Faythe Ghee,  May 09, 2010 8:57 AM  Follow-up for Phone Call         pt called to report symptoms of head fullness, left sided weakness and shortness of breath, same symptoms that ocurred while taking metoprolol   these symptoms had resolved for 2-3 days once she d/c metoprolol and started carvedolol two times a day bp 103/71, hr 75  she is also taking dgoxin 0.125mg  daily   Follow-up by: Teressa Lower RN,  May 09, 2010 11:56 AM  Additional Follow-up for Phone Call Additional follow up Details #1::        PT CALLING AGAIN TODAY FOR SAME REASON. SHE HAS NOT HEARD ANYTHING BACK AND WOULD LIKE TO KNOW SOMETHING TODAY. SHE STATES THAT SHE MAY JUST COME IN AND GET IT RESOLVED. Additional Follow-up by: Faythe Ghee,  May 11, 2010 8:35 AM    Additional Follow-up for Phone Call Additional follow up Details #2::    OK to be seen as needed.  I cannot diagnose over the phone. Follow-up by: Laren Boom, MD, [facility],  May 17, 2010 10:23 PM   Appended Document: PT HAVING PROBLUMES WITH NEW MEDICATION CHANGES Spoke with Drs. Berlin Hun, both MDs agree to pt's request to change cardiologist to Dr. Dietrich Pates.  Pt scheduled to see KL

## 2010-08-09 NOTE — Assessment & Plan Note (Signed)
Summary: f/u tests to be done 04/08/10/tg   Visit Type:  Follow-up Primary Provider:  Norins  CC:  heart racing and fu test.  History of Present Illness: [name] returns today for followup.  She is a pleasant 77 yo woman with a h/o SVT, who has been well controlled on beta blockers and calcium channel blockers.  Since we saw her last, she continues to have heart racing and her blood pressure has been elevated.  She denies peripheral edema.  She has had a 2D echo and a stress test both of which were negative.   Current Medications (verified): 1)  Glucosamine Complex   Tabs (Nutritional Supplements) .... Take One Tablet Once Daily 2)  Welchol 625 Mg  Tabs (Colesevelam Hcl) .... Take 3 Tablets By Mouth Two Times A Day 3)  Nexium 40 Mg  Cpdr (Esomeprazole Magnesium) .... Take One Tablet Daily 4)  Alprazolam 0.25 Mg  Tabs (Alprazolam) .... Take 1/2t Tablet As Needed 5)  Cvs Aspirin Ec 325 Mg  Tbec (Aspirin) .Marland Kitchen.. 1 By Mouth Once Daily 6)  Verapamil Hcl Cr 180 Mg Cr-Tabs (Verapamil Hcl) .... Take 1 Tablet By Mouth Once Daily 7)  Fish Oil 1000 Mg Caps (Omega-3 Fatty Acids) .... Take 1 Tablet By Mouth Once A Day 8)  Red Yeast Rice Extract 600 Mg Caps (Red Yeast Rice Extract) .... Take 1-2 Tablet By Mouth Once A Day 9)  Multivitamins  Tabs (Multiple Vitamin) .... Take 1 Tablet By Mouth Once A Day 10)  Vitamin D 1000 Unit Tabs (Cholecalciferol) .... Take 1 Tablet By Mouth Once A Day 11)  Nitrostat 0.4 Mg Subl (Nitroglycerin) .Marland Kitchen.. 1 Tablet Under Tongue At Onset of Chest Pain; You May Repeat Every 5 Minutes For Up To 3 Doses. 12)  Pradaxa 150 Mg Caps (Dabigatran Etexilate Mesylate) .Marland Kitchen.. 1 By Mouth Two Times A Day For Stroke Protection in A. Fib 13)  Carvedilol 6.25 Mg Tabs (Carvedilol) .... Take One Tablet By Mouth Twice A Day 14)  Digoxin 0.125 Mg Tabs (Digoxin) .... Take 1 Tablet By Mouth Once A Day  Allergies (verified): 1)  ! * Pain Med Pt Unsure Of. 2)  !  Hydrocodone  Comments:  Nurse/Medical Assistant: patient reviewed previous med list and stated she was nott taking arimidex ibuprofen,  Past History:  Past Medical History: Last updated: 11/24/2009  Hx of HERPES ZOSTER (ICD-053.9) PALPITATIONS, RECURRENT (ICD-785.1) CORONARY ARTERY DISEASE (ICD-414.00) HYPERLIPIDEMIA (ICD-272.4) * Hx of POLIO AT AGE 37 PERIPHERAL NEUROPATHY (ICD-356.9) CARPAL TUNNEL RELEASE, HX OF (ICD-V45.89) HIATAL HERNIA, HX OF (ICD-V12.79) ANXIETY (ICD-300.00) HYPERTENSION (ICD-401.9) GERD (ICD-530.81) CARCINOMA, BREAST, HX OF (ICD-V10.3) Coronary artery disease - cath Feb '07- no obstructive dz - 20% proximal LAD only; EF 65% diastolic dysfunction Peripheral neuropathy   Physican Roster:                oncology ---Dr. Cyndie Chime                gyn -           Dr. Duane Lope  Past Surgical History: Last updated: 11/11/2007 MASTECTOMY, RADICAL, WITH AXILLARY LYMPH NODES, HX OF LEFT (ICD-V45.71) CHOLECYSTECTOMY, HX OF (ICD-V45.79) '99    Review of Systems  The patient denies chest pain, syncope, dyspnea on exertion, and peripheral edema.    Vital Signs:  Patient profile:   77 year old female Weight:      165 pounds BMI:     31.29 Pulse rate:   94 / minute BP  sitting:   125 / 90  (right arm)  Vitals Entered By: Dreama Saa, CNA (April 25, 2010 2:34 PM)  Physical Exam  General:  Well developed, well nourished, in no acute distress. Head:  normocephalic and atraumatic.   Eyes:  pupils equal, pupils round, corneas and lenses clear, no optic disk abnormalities, and no retinal abnormalitiies.   Mouth:  Oral mucosa and oropharynx without lesions or exudates.  Teeth in good repair. Neck:  supple.   Chest Wall:  no deformities.   Lungs:  Clear bilaterally to auscultation with no wheezes, rales, or rhonci. Heart:  Irregular without MRG Abdomen:  Bowel sounds positive; abdomen soft and non-tender without masses, organomegaly, or hernias  noted. No hepatosplenomegaly. Msk:  normal ROM, no joint tenderness, no joint swelling, and no joint warmth.   Pulses:  pulses normal in all 4 extremities Extremities:  trace left pedal edema and trace right pedal edema.   Neurologic:  Alert and oriented x 3.   Impression & Recommendations:  Problem # 1:  ATRIAL FIBRILLATION (ICD-427.31) Her symptoms suggest her rate is not well controlled and I have asked that she stop her metoprolol and start carvedilol and digoxin for better rate control. The following medications were removed from the medication list:    Metoprolol Tartrate 25 Mg Tabs (Metoprolol tartrate) .Marland Kitchen... 1 by mouth two times a day f0r heart rate Her updated medication list for this problem includes:    Cvs Aspirin Ec 325 Mg Tbec (Aspirin) .Marland Kitchen... 1 by mouth once daily    Carvedilol 6.25 Mg Tabs (Carvedilol) .Marland Kitchen... Take one tablet by mouth twice a day  Problem # 2:  HYPERTENSION (ICD-401.9)  Her blood pressure has not been well controlled.  I will change her meds. The following medications were removed from the medication list:    Metoprolol Tartrate 25 Mg Tabs (Metoprolol tartrate) .Marland Kitchen... 1 by mouth two times a day f0r heart rate Her updated medication list for this problem includes:    Cvs Aspirin Ec 325 Mg Tbec (Aspirin) .Marland Kitchen... 1 by mouth once daily    Verapamil Hcl Cr 180 Mg Cr-tabs (Verapamil hcl) .Marland Kitchen... Take 1 tablet by mouth once daily    Carvedilol 6.25 Mg Tabs (Carvedilol) .Marland Kitchen... Take one tablet by mouth twice a day  The following medications were removed from the medication list:    Metoprolol Tartrate 25 Mg Tabs (Metoprolol tartrate) .Marland Kitchen... 1 by mouth two times a day f0r heart rate Her updated medication list for this problem includes:    Cvs Aspirin Ec 325 Mg Tbec (Aspirin) .Marland Kitchen... 1 by mouth once daily    Verapamil Hcl Cr 180 Mg Cr-tabs (Verapamil hcl) .Marland Kitchen... Take 1 tablet by mouth once daily    Carvedilol 6.25 Mg Tabs (Carvedilol) .Marland Kitchen... Take one tablet by mouth twice a  day  Patient Instructions: 1)  Your physician recommends that you schedule a follow-up appointment in: 2 months 2)  Your physician has recommended you make the following change in your medication: stop metoprolol, start carvedilol 6.25mg  two times a day, start digoxin 0.125mg  daily Prescriptions: DIGOXIN 0.125 MG TABS (DIGOXIN) Take 1 tablet by mouth once a day  #90 x 3   Entered by:   Teressa Lower RN   Authorized by:   Laren Boom, MD, [facility]   Signed by:   Teressa Lower RN on 04/25/2010   Method used:   [facility]ron[facility] to        [facility] [phone]* (retail)  [address]       [place], [place]  [address]       Ph: [phone] or [phone]       Fax: [phone]   RxID:   [id number] CARVEDILOL 6.25 MG TABS (CARVEDILOL) Take one tablet by mouth twice a day  #180 x 1   Entered by:   Teressa Lower RN   Authorized by:   Laren Boom, MD, [facility]   Signed by:   Teressa Lower RN on 04/25/2010   Method used:   [facility]ron[facility] to        [facility] [phone]* (retail)       [address]       [place], [place]  [address]       Ph: [phone] or [phone]       Fax: [phone]   RxID:   [id number]

## 2010-08-09 NOTE — Letter (Signed)
Summary: [facility], [facility] at [employer]  [address] [place] [address]   Phone: [phone]  Fax: [phone]     June 17, 2010 MRN: [id number]   [name] [address] [place], [place]  [address]      YOUR LAB WORK IS DUE   July 29, 2010  Please go to [facility], located across the street from [facility] on the second floor.  Hours are Monday - Friday 7am until 7:30pm         Saturday 8am until 12noon      _X_ YOUR LABWORK IS NOT FASTING --YOU MAY EAT PRIOR TO LABWORK

## 2010-08-09 NOTE — Progress Notes (Signed)
**Note De-Identified Jesaiah Fabiano Obfuscation** Summary: Pt having problems since medication change  Phone Note Call from Patient   Caller: Patient Reason for Call: Talk to Nurse Summary of Call: S: Pt. states that since her meds were changed she has been weak, SOB, and has had some chest pressure / please return call/tg  Initial call taken by: Raechel Ache [facility],  April 04, 2010 9:16 AM  Follow-up for Phone Call        B: On 9-19 OV with Dr. Debby Bud, Dr. Debby Bud spoke with Dr. Excell Seltzer (DOD on 9-19) and then advised pt. to increase Verapamil to 240mg  once daily, start taking Metoprolol 25mg  by mouth two times a day, start taking Pradaxa 150mg  by mouth two times a day and to stop taking Asa.  A: Pt. c/o being light headed, sob, weak and fatigued. She states she did not take her meds this morning except for Pradaxa due to these symptoms. She is scheduled to have Echo, Stress test and obtain Holter monitor on 9-30 and will f/u with Dr. Ladona Ridgel on 10-17.  R: We will call her with Joni Reining, NP recommendations.       Follow-up by: Larita Fife Larosa Rhines LPN,  April 04, 2010 3:45 PM  Additional Follow-up for Phone Call Additional follow up Details #1::        Please tell her to stop the higher dose of the verapamil and to go back down to 180mg  daily. If possible, have her take her BP at home and call us for systolic below 110. Also, please get a CBC to to evaluate for anemia.    Additional Follow-up for Phone Call Additional follow up Details #2::    Per Joni Reining, NP pt. is advised to decrease Verapamil to 180mg  by mouth once daily, have CBC drawn and to check BP daily and to call this office for SBP< 110. Pt. states she understands instructions given.  New/Updated Medications: VERAPAMIL HCL CR 180 MG CR-TABS (VERAPAMIL HCL) take 1 tablet by mouth once daily

## 2010-08-09 NOTE — Assessment & Plan Note (Signed)
Summary: YEARLY F/U /MEDICARE/#/CD   Vital Signs:  Patient profile:   77 year old female Height:      61 inches Weight:      172 pounds BMI:     32.62 O2 Sat:      97 % on Room air Temp:     97.4 degrees F oral Pulse rate:   63 / minute BP sitting:   118 / 80  (left arm) Cuff size:   regular  Vitals Entered By: Bill Salinas CMA (Nov 24, 2009 10:08 AM)  O2 Flow:  Room air CC: pt here for yearly follow up, she is due for tetanus shot, she has never had shingles vaccine and is having her mammogram today. Her gyno. is Dr Tenny Craw and she is due for an eye exam which she states she will get sch/ ab   Primary Care Provider:  Friend Dorfman  CC:  pt here for yearly follow up, she is due for tetanus shot, and she has never had shingles vaccine and is having her mammogram today. Her gyno. is Dr Tenny Craw and she is due for an eye exam which she states she will get sch/ ab.  History of Present Illness: Patient presents for routine medical follow-up. She reports that she had a cough in February '11: she was treated with a z-pak and benzonatate perle (?).  She did have a chest x-ray which revealed Cardiomegaly. Reveiwed previous CXR reports and images to '07 - no real change in cardiac sillhoute over 5 years time. She has had area of bruising at the right tonsilar fossa.   Has a mammogram today.   She had pelvic exam last year and did have an endometrial biopsy for scant bleeding that was normal.  She follows with DR. Cyndie Chime re: breast cancer. She is asking me to draw tumor markers. Reviewed "UpToDate" on this topic: tumor markers are not recommended as part of a post-breast cancer surveillence protocol. She is provided this chpt for her information.   Current Medications (verified): 1)  Altace 5 Mg  Caps (Ramipril) .... Take One Capsule Daily 2)  Arimidex 1 Mg  Tabs (Anastrozole) .... Take One Tablet Once Daily 3)  Glucosamine Complex   Tabs (Nutritional Supplements) .... Take One Tablet Once Daily 4)   Welchol 625 Mg  Tabs (Colesevelam Hcl) .... Take 1 Tablet By Mouth Once A Day 5)  Nexium 40 Mg  Cpdr (Esomeprazole Magnesium) .... Take One Tablet Daily 6)  Bl Ibuprofen 200 Mg  Tabs (Ibuprofen) .... Take One Tablet Daily 7)  Alprazolam 0.25 Mg  Tabs (Alprazolam) .... Take One Tablet As Needed 8)  Cvs Aspirin Ec 325 Mg  Tbec (Aspirin) .Marland Kitchen.. 1 By Mouth Once Daily 9)  Verapamil Hcl Cr 180 Mg Xr24h-Cap (Verapamil Hcl) .... Take 1 Tablet By Mouth Once A Day 10)  Fish Oil 1000 Mg Caps (Omega-3 Fatty Acids) .... Take 1 Tablet By Mouth Once A Day 11)  Red Yeast Rice Extract 600 Mg Caps (Red Yeast Rice Extract) .... Take 1-2 Tablet By Mouth Once A Day 12)  Multivitamins  Tabs (Multiple Vitamin) .... Take 1 Tablet By Mouth Once A Day 13)  Vitamin C 500 Mg Tabs (Ascorbic Acid) .... Take 1 Tablet By Mouth Once A Day As Needed 14)  Vitamin D 1000 Unit Tabs (Cholecalciferol) .... Take 1 Tablet By Mouth Once A Day 15)  Nitrostat 0.4 Mg Subl (Nitroglycerin) .Marland Kitchen.. 1 Tablet Under Tongue At Onset of Chest Pain; You May Repeat Every  5 Minutes For Up To 3 Doses.  Allergies (verified): 1)  ! * Pain Med Pt Unsure Of. 2)  ! Hydrocodone  Past History:  Family History: Last updated: 08/19/2007 mother with heart disease father died with multiple myeloma 1 sister with lung cancer 1 sister with dialysis 1 brother with renal cell cancer 1 brother with lymphoma sister with DM  Social History: Last updated: 11/11/2007 Never Smoked Alcohol use-no Married '53 2 sons - '55, '58 retired homemaker  Risk Factors: Alcohol Use: 0 (11/12/2008) Caffeine Use: 0 (11/12/2008)  Risk Factors: Smoking Status: never (11/03/2009)  Past Medical History:  Hx of HERPES ZOSTER (ICD-053.9) PALPITATIONS, RECURRENT (ICD-785.1) CORONARY ARTERY DISEASE (ICD-414.00) HYPERLIPIDEMIA (ICD-272.4) * Hx of POLIO AT AGE 65 PERIPHERAL NEUROPATHY (ICD-356.9) CARPAL TUNNEL RELEASE, HX OF (ICD-V45.89) HIATAL HERNIA, HX OF  (ICD-V12.79) ANXIETY (ICD-300.00) HYPERTENSION (ICD-401.9) GERD (ICD-530.81) CARCINOMA, BREAST, HX OF (ICD-V10.3) Coronary artery disease - cath Feb '07- no obstructive dz - 20% proximal LAD only; EF 65% diastolic dysfunction Peripheral neuropathy   Physican Roster:                oncology ---Dr. Cyndie Chime                gyn -           Dr. Duane Lope  Past Surgical History: Reviewed history from 11/11/2007 and no changes required. MASTECTOMY, RADICAL, WITH AXILLARY LYMPH NODES, HX OF LEFT (ICD-V45.71) CHOLECYSTECTOMY, HX OF (ICD-V45.79) '99    Family History: Reviewed history from 08/19/2007 and no changes required. mother with heart disease father died with multiple myeloma 1 sister with lung cancer 1 sister with dialysis 1 brother with renal cell cancer 1 brother with lymphoma sister with DM  Social History: Reviewed history from 11/11/2007 and no changes required. 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  The patient denies anorexia, fever, weight gain, hoarseness, chest pain, syncope, dyspnea on exertion, prolonged cough, hemoptysis, melena, severe indigestion/heartburn, incontinence, muscle weakness, transient blindness, depression, abnormal bleeding, angioedema, and breast masses.         palpatations - had an episode of tachycardia about 6 weeks ago. Lasted about 30 minutes.  Physical Exam  General:  Overwight white female in no distress Head:  Normocephalic and atraumatic without obvious abnormalities. No apparent alopecia or balding. Eyes:  vision grossly intact, pupils equal, pupils round, corneas and lenses clear, no injection, and no retinal abnormalitiies.   Ears:  R ear normal and L ear normal.   Nose:  no external deformity and no external erythema.   Mouth:  Oral mucosa and oropharynx without lesions or exudates.  Teeth in good repair. Neck:  supple,  full ROM, no thyromegaly, and no carotid bruits.   Chest Wall:  no deformities.   Breasts:  deferred to Oncology Lungs:  Normal respiratory effort, chest expands symmetrically. Lungs are clear to auscultation, no crackles or wheezes. Heart:  Normal rate and regular rhythm. S1 and S2 normal without gallop, murmur, click, rub or other extra sounds. Abdomen:  soft, non-tender, normal bowel sounds, no guarding, and no hepatomegaly.   Genitalia:  deferred to gyn Msk:  normal ROM, no joint tenderness, no joint swelling, and no joint warmth.   Pulses:  2+ radial and DP pulses Extremities:  No clubbing, cyanosis, edema, or deformity noted with normal full range of motion of all joints.  Neurologic:  alert & oriented X3, cranial nerves II-XII intact, strength normal in all extremities, gait normal, and DTRs symmetrical and normal.   Skin:  turgor normal, color normal, no rashes, and no suspicious lesions.   Cervical Nodes:  no anterior cervical adenopathy and no posterior cervical adenopathy.   Psych:  Oriented X3, memory intact for recent and remote, normally interactive, good eye contact, and not anxious appearing.     Impression & Recommendations:  Problem # 1:  PALPITATIONS, RECURRENT (ICD-785.1) Reviewed Dr. Lubertha Basque recent note. She has PSVT. Provided a full explanation and cartoon.   Plan - she is instructed in valsalva manuever           continue verapamil.  Problem # 2:  CORONARY ARTERY DISEASE (ICD-414.00) Stable with no symptoms.  Plan - continue risk modification - requested lipid panel be drawn at cancer center  Her updated medication list for this problem includes:    Altace 5 Mg Caps (Ramipril) .Marland Kitchen... Take one capsule daily    Cvs Aspirin Ec 325 Mg Tbec (Aspirin) .Marland Kitchen... 1 by mouth once daily    Verapamil Hcl Cr 180 Mg Xr24h-cap (Verapamil hcl) .Marland Kitchen... Take 1 tablet by mouth once a day    Nitrostat 0.4 Mg Subl (Nitroglycerin) .Marland Kitchen... 1 tablet under tongue at onset of chest pain; you  may repeat every 5 minutes for up to 3 doses.  Problem # 3:  HYPERLIPIDEMIA (ICD-272.4) Patient is taking red yeast rice and has decreased her welchol.  Plan - lipid panel at cancer cneter. Recommendations to follow.  Her updated medication list for this problem includes:    Welchol 625 Mg Tabs (Colesevelam hcl) .Marland Kitchen... Take 3 tablets by mouth two times a day  Problem # 4:  HYPERTENSION (ICD-401.9)  Her updated medication list for this problem includes:    Altace 5 Mg Caps (Ramipril) .Marland Kitchen... Take one capsule daily    Verapamil Hcl Cr 180 Mg Xr24h-cap (Verapamil hcl) .Marland Kitchen... Take 1 tablet by mouth once a day  BP today: 118/80 Prior BP: 111/74 (11/03/2009)  Labs Reviewed: K+: 4.2 (11/12/2008)  Good control. Continue present meds  Problem # 5:  CARCINOMA, BREAST, HX OF (ICD-V10.3) Patient followed by Dr. Cyndie Chime. See discussion HPI  Problem # 6:  Preventive Health Care (ICD-V70.0) Normal exam. Labs pending. Getting mammogram. Last colonsocopy in '05. Had pneumovax in '05. Candidate for tetnus. She has had shingle, thus not candidate for vaccine. She has a lot of stress but is not depressed.  In summary = a delightful woman who is medically stable. She will return as needed or 1 year.   Complete Medication List: 1)  Altace 5 Mg Caps (Ramipril) .... Take one capsule daily 2)  Arimidex 1 Mg Tabs (Anastrozole) .... Take one tablet once daily 3)  Glucosamine Complex Tabs (Nutritional supplements) .... Take one tablet once daily 4)  Welchol 625 Mg Tabs (Colesevelam hcl) .... Take 3 tablets by mouth two times a day 5)  Nexium 40 Mg Cpdr (Esomeprazole magnesium) .... Take one tablet daily 6)  Bl Ibuprofen 200 Mg Tabs (Ibuprofen) .... Take one tablet daily 7)  Alprazolam 0.25 Mg Tabs (Alprazolam) .... Take one tablet as needed 8)  Cvs Aspirin Ec 325 Mg Tbec (Aspirin) .Marland Kitchen.. 1 by mouth once daily 9)  Verapamil Hcl Cr 180 Mg Xr24h-cap (Verapamil hcl) .... Take 1 tablet by mouth once a day 10)   Fish Oil 1000 Mg Caps (Omega-3 fatty acids) .... Take 1 tablet by mouth once a day  11)  Red Yeast Rice Extract 600 Mg Caps (Red yeast rice extract) .... Take 1-2 tablet by mouth once a day 12)  Multivitamins Tabs (Multiple vitamin) .... Take 1 tablet by mouth once a day 13)  Vitamin C 500 Mg Tabs (Ascorbic acid) .... Take 1 tablet by mouth once a day as needed 14)  Vitamin D 1000 Unit Tabs (Cholecalciferol) .... Take 1 tablet by mouth once a day 15)  Nitrostat 0.4 Mg Subl (Nitroglycerin) .Marland Kitchen.. 1 tablet under tongue at onset of chest pain; you may repeat every 5 minutes for up to 3 doses.  Other Orders: Subsequent annual wellness visit with prevention plan ([id number]) Prescriptions: ALPRAZOLAM 0.25 MG  TABS (ALPRAZOLAM) Take one tablet as needed  #90 x 3   Entered and Authorized by:   Jacques Navy MD   Signed by:   Jacques Navy MD on 11/24/2009   Method used:   Print then Give to Patient   RxID:   [id number] VERAPAMIL HCL CR 180 MG XR24H-CAP (VERAPAMIL HCL) Take 1 tablet by mouth once a day  #90 x 3   Entered and Authorized by:   Jacques Navy MD   Signed by:   Jacques Navy MD on 11/24/2009   Method used:   Electronically to        [facility] [phone]* (retail)       [address]       [place], [place]  [address]       Ph: [phone] or [phone]       Fax: [phone]   RxID:   [id number] NEXIUM 40 MG  CPDR (ESOMEPRAZOLE MAGNESIUM) Take one tablet daily  #90 x 3   Entered and Authorized by:   Jacques Navy MD   Signed by:   Jacques Navy MD on 11/24/2009   Method used:   Electronically to        [facility]. [phone]* (retail)       [address]       [place], [place]  [address]       Ph: [phone] or [phone]       Fax: [phone]   RxID:   [id number] WELCHOL 625 MG  TABS (COLESEVELAM HCL) Take 3 tablets by mouth two times a day  #540 x 3   Entered and Authorized by:   Jacques Navy MD   Signed by:    Jacques Navy MD on 11/24/2009   Method used:   Electronically to        [facility] [phone]* (retail)       [address]       [place], [place]  [address]       Ph: [phone] or [phone]       Fax: [phone]   RxID:   [id number] ALTACE 5 MG  CAPS (RAMIPRIL) Take one capsule daily  #90 x 3   Entered and Authorized by:   Jacques Navy MD   Signed by:   Jacques Navy MD on 11/24/2009   Method used:   Electronically to        [facility] [phone]* (retail)       [address]       [place], [place]  [address]  Ph: [phone] or [phone]       Fax: [phone]   RxID:   [id number]

## 2010-08-09 NOTE — Progress Notes (Signed)
  Phone Note Outgoing Call   Reason for Call: Discuss lab or test results Summary of Call: please call patient: blood counts are normal.  THANKs Initial call taken by: Jacques Navy MD,  April 14, 2010 5:41 AM  Follow-up for Phone Call        lmoam for pt to call back Follow-up by: Ami Bullins CMA,  April 14, 2010 4:19 PM  Additional Follow-up for Phone Call Additional follow up Details #1::        informed pt  Additional Follow-up by: Ami Bullins CMA,  April 14, 2010 4:50 PM

## 2010-08-09 NOTE — Letter (Signed)
Summary: [place] [facility] (Nuc Med Stress)  [facility]  [address] [place] [address]   Phone: [phone]  Fax: [phone]    Nuclear Medicine 1-Day Stress Test Information Sheet  Re:     [name]   DOB:     1932/03/27 MRN:     [id number] Weight:  Appointment Date: Register at: Appointment Time: Referring MD:  ___Exercise Stress  __Adenosine   __Dobutamine  _x_Lexiscan  __Persantine   __Thallium  Urgency: ____1 (next day)   ____2 (one week)    ____3 (PRN)  Patient will receive Follow Up call with results: Patient needs follow-up appointment:  Instructions regarding medication:  How to prepare for your stress test: 1. DO NOT eat or dring 6 hours prior to your arrival time. This includes no caffeine (coffee, tea, sodas, chocolate) if you were instructed to take your medications, drink water with it. 2. DO NOT use any tobacco products for at leaset 8 hours prior to arrival. 3. DO NOT wear dresses or any clothing that may have metal clasps or buttons. 4. Wear short sleeve shirts, loose clothing, and comfortalbe walking shoes. 5. DO NOT use lotions, oils or powder on your chest before the test. 6. The test will take approximately 3-4 hours from the time you arrive until completion. 7. To register the day of the test, go to the Short Stay entrance at [facility]. 8. If you must cancel your test, call [phone] as soon as you are aware. DO NOT TAKE YOUR METOPROLOL THE MORNING OF YOUR TEST After you arrive for test:   When you arrive at [facility], you will go to Short Stay to be registered. They will then send you to Radiology to check in. The Nuclear Medicine Tech will get you and start an IV in your arm or hand. A small amount of a radioactive tracer will then be injected into your IV. This tracer will then have to circulate for 30-45 minutes. During this time you will wait in the waiting room and you will be able to drink something  without caffeine. A series of pictures will be taken of your heart follwoing this waiting period. After the 1st set of pictures you will go to the stress lab to get ready for your stress test. During the stress test, another small amount of a radioactive tracer will be injected through your IV. When the stress test is complete, there is a short rest period while your heart rate and blood pressure will be monitored. When this monitoring period is complete you will have another set of pictrues taken. (The same as the 1st set of pictures). These pictures are taken between 15 minutes and 1 hour after the stress test. The time depends on the type of stress test you had. Your doctor will inform you of your test results within 7 days after test.    The possibilities of certain changes are possible during the test. They include abnormal blood pressure and disorders of the heart. Side effects of persantine or adenosine can include flushing, chest pain, shortness of breath, stomach tightness, headache and light-headedness. These side effects usually do not last long and are self-resolving. Every effort will be made to keep you comfortable and to minimize complications by obtaining a medical history and by close observation during the test. Emergency equipment, medications, and trained personnel are available to deal with any unusual situation which may arise.  Please notify office at least 48  hours in advance if you are unable to keep this appt.

## 2010-08-09 NOTE — Assessment & Plan Note (Signed)
Summary: HEART RATE:111---BP  111/103---STC   Vital Signs:  Patient profile:   77 year old female Height:      61 inches Weight:      165 pounds BMI:     31.29 O2 Sat:      94 % on Room air Temp:     98.6 degrees F oral Pulse rate:   116 / minute BP sitting:   120 / 90  (left arm) Cuff size:   regular  Vitals Entered By: Bill Salinas CMA (March 28, 2010 3:36 PM)  O2 Flow:  Room air CC: pulse rate up in the past two weeks with elevated BP/ ab   Primary Care Provider:  Norins  CC:  pulse rate up in the past two weeks with elevated BP/ ab.  History of Present Illness: Patient reports that for several weeks she has been tachycardic and her  DBP has been elevated. She has been light-headed. she has had some pressure feeling in her chest and pain in her back between the shoulder blades. She took nitro-stat x 2 and had relief. She called Dr. Ladona Ridgel but he wasn't in [place] so she waited. She now presents for continued sympotmatic rapid heart rate.   Current Medications (verified): 1)  Altace 5 Mg  Caps (Ramipril) .... Take One Capsule Daily 2)  Arimidex 1 Mg  Tabs (Anastrozole) .... Take One Tablet Once Daily 3)  Glucosamine Complex   Tabs (Nutritional Supplements) .... Take One Tablet Once Daily 4)  Welchol 625 Mg  Tabs (Colesevelam Hcl) .... Take 3 Tablets By Mouth Two Times A Day 5)  Nexium 40 Mg  Cpdr (Esomeprazole Magnesium) .... Take One Tablet Daily 6)  Bl Ibuprofen 200 Mg  Tabs (Ibuprofen) .... Take One Tablet Daily 7)  Alprazolam 0.25 Mg  Tabs (Alprazolam) .... Take One Tablet As Needed 8)  Cvs Aspirin Ec 325 Mg  Tbec (Aspirin) .Marland Kitchen.. 1 By Mouth Once Daily 9)  Verapamil Hcl Cr 180 Mg Xr24h-Cap (Verapamil Hcl) .... Take 1 Tablet By Mouth Once A Day 10)  Fish Oil 1000 Mg Caps (Omega-3 Fatty Acids) .... Take 1 Tablet By Mouth Once A Day 11)  Red Yeast Rice Extract 600 Mg Caps (Red Yeast Rice Extract) .... Take 1-2 Tablet By Mouth Once A Day 12)  Multivitamins  Tabs  (Multiple Vitamin) .... Take 1 Tablet By Mouth Once A Day 13)  Vitamin C 500 Mg Tabs (Ascorbic Acid) .... Take 1 Tablet By Mouth Once A Day As Needed 14)  Vitamin D 1000 Unit Tabs (Cholecalciferol) .... Take 1 Tablet By Mouth Once A Day 15)  Nitrostat 0.4 Mg Subl (Nitroglycerin) .Marland Kitchen.. 1 Tablet Under Tongue At Onset of Chest Pain; You May Repeat Every 5 Minutes For Up To 3 Doses.  Allergies (verified): 1)  ! * Pain Med Pt Unsure Of. 2)  ! Hydrocodone  Past History:  Past Medical History: Last updated: 11/24/2009  Hx of HERPES ZOSTER (ICD-053.9) PALPITATIONS, RECURRENT (ICD-785.1) CORONARY ARTERY DISEASE (ICD-414.00) HYPERLIPIDEMIA (ICD-272.4) * Hx of POLIO AT AGE 20 PERIPHERAL NEUROPATHY (ICD-356.9) CARPAL TUNNEL RELEASE, HX OF (ICD-V45.89) HIATAL HERNIA, HX OF (ICD-V12.79) ANXIETY (ICD-300.00) HYPERTENSION (ICD-401.9) GERD (ICD-530.81) CARCINOMA, BREAST, HX OF (ICD-V10.3) Coronary artery disease - cath Feb '07- no obstructive dz - 20% proximal LAD only; EF 65% diastolic dysfunction Peripheral neuropathy   Physican Roster:                oncology ---Dr. Cyndie Chime  gyn -           Dr. Duane Lope  Past Surgical History: Last updated: 11/11/2007 MASTECTOMY, RADICAL, WITH AXILLARY LYMPH NODES, HX OF LEFT (ICD-V45.71) CHOLECYSTECTOMY, HX OF (ICD-V45.79) '99    Family History: Last updated: 08/19/2007 mother with heart disease father died with multiple myeloma 1 sister with lung cancer 1 sister with dialysis 1 brother with renal cell cancer 1 brother with lymphoma sister with DM  Social History: Last updated: 11/11/2007 Never Smoked Alcohol use-no Married '53 2 sons - '55, '58 retired homemaker  Risk Factors: Alcohol Use: 0 (11/12/2008) Caffeine Use: 0 (11/12/2008)  Risk Factors: Smoking Status: never (11/03/2009)  Review of Systems       The patient complains of chest pain.  The patient denies anorexia, fever, weight loss, weight gain,  decreased hearing, syncope, dyspnea on exertion, peripheral edema, prolonged cough, abdominal pain, severe indigestion/heartburn, muscle weakness, difficulty walking, unusual weight change, abnormal bleeding, and enlarged lymph nodes.    Physical Exam  General:  overweight, white female in no acute distress Head:  normocephalic and atraumatic.   Eyes:  pupils equal, pupils round, corneas and lenses clear, no optic disk abnormalities, and no retinal abnormalitiies.   Neck:  supple.   Lungs:  normal respiratory effort, normal breath sounds, no crackles, and no wheezes.   Heart:  Irregular tachycardia without murmur. Abdomen:  soft, non-tender, and normal bowel sounds.   Neurologic:  alert & oriented X3, cranial nerves II-XII intact, and gait normal.     Impression & Recommendations:  Problem # 1:  ATRIAL FIBRILLATION (ICD-427.31)  Patient with new on-set atrial fibrillation by EKG without ischemia or damage. She has had mild symptoms including tachycardia induced chest pain . She has not had syncope, N/V, diaphoresis. She is being treated for PSVT with verapamil 180. She has a h/o CAD  Plan - discussed with Dr. Excell Seltzer, DOD for cardiology           increase verapamil to 240mg  once daily           add metoprolol 25mg  two times a day           stop asa and add pradaxa 150mg  two times a day for stroke prevention           appointment Thursday,m 9/21 with Ms. Lawrence, NP at [facility] office of [employer]           Pt carefully instructed to call for problems: after hours to call cardiology; during work hours to call PCP   Her updated medication list for this problem includes:    Cvs Aspirin Ec 325 Mg Tbec (Aspirin) .Marland Kitchen... 1 by mouth once daily    Verapamil Hcl Cr 240 Mg Cr-tabs (Verapamil hcl) .Marland Kitchen... 1 by mouth once daily    Metoprolol Tartrate 25 Mg Tabs (Metoprolol tartrate) .Marland Kitchen... 1 by mouth two times a day f0r heart rate  Orders: EKG w/ Interpretation (93000)  Complete  Medication List: 1)  Altace 5 Mg Caps (Ramipril) .... Take one capsule daily 2)  Arimidex 1 Mg Tabs (Anastrozole) .... Take one tablet once daily 3)  Glucosamine Complex Tabs (Nutritional supplements) .... Take one tablet once daily 4)  Welchol 625 Mg Tabs (Colesevelam hcl) .... Take 3 tablets by mouth two times a day 5)  Nexium 40 Mg Cpdr (Esomeprazole magnesium) .... Take one tablet daily 6)  Bl Ibuprofen 200 Mg Tabs (Ibuprofen) .... Take one tablet daily 7)  Alprazolam 0.25 Mg Tabs (Alprazolam) .Marland KitchenMarland KitchenMarland Kitchen  Take one tablet as needed 8)  Cvs Aspirin Ec 325 Mg Tbec (Aspirin) .Marland Kitchen.. 1 by mouth once daily 9)  Verapamil Hcl Cr 240 Mg Cr-tabs (Verapamil hcl) .Marland Kitchen.. 1 by mouth once daily 10)  Fish Oil 1000 Mg Caps (Omega-3 fatty acids) .... Take 1 tablet by mouth once a day 11)  Red Yeast Rice Extract 600 Mg Caps (Red yeast rice extract) .... Take 1-2 tablet by mouth once a day 12)  Multivitamins Tabs (Multiple vitamin) .... Take 1 tablet by mouth once a day 13)  Vitamin C 500 Mg Tabs (Ascorbic acid) .... Take 1 tablet by mouth once a day as needed 14)  Vitamin D 1000 Unit Tabs (Cholecalciferol) .... Take 1 tablet by mouth once a day 15)  Nitrostat 0.4 Mg Subl (Nitroglycerin) .Marland Kitchen.. 1 tablet under tongue at onset of chest pain; you may repeat every 5 minutes for up to 3 doses. 16)  Metoprolol Tartrate 25 Mg Tabs (Metoprolol tartrate) .Marland Kitchen.. 1 by mouth two times a day f0r heart rate 17)  Pradaxa 150 Mg Caps (Dabigatran etexilate mesylate) .Marland Kitchen.. 1 by mouth two times a day for stroke protection in a. fib  Patient Instructions: 1)  rapid heart rate - new on-set atrial fibrillation. No sign of heart damage. discussed with Dr. Excell Seltzer, cardiologist. Plan - increase verapamil to 240mg  once a day, add metoprolol 25mg  two times a day, start Pradaxa 150mg  two times a day as blood thinner. Continue all other medications except STOP aspirin.  2)  You have an appointment with Edwin Cap, NP at the [facility] heart office  Thursday at 1:40 PM.  3)  Call for any problems: low BP, increased chest pain or anything else.  Prescriptions: PRADAXA 150 MG CAPS (DABIGATRAN ETEXILATE MESYLATE) 1 by mouth two times a day for stroke protection in a. fib  #60 x 1   Entered and Authorized by:   Jacques Navy MD   Signed by:   Jacques Navy MD on 03/28/2010   Method used:   [facility] to        [facility] [phone]* (retail)       [address]       [place], [place]  [address]       Ph: [phone] or [phone]       Fax: [phone]   RxID:   [id number] METOPROLOL TARTRATE 25 MG TABS (METOPROLOL TARTRATE) 1 by mouth two times a day f0r heart rate  #60 x 1   Entered and Authorized by:   Jacques Navy MD   Signed by:   Jacques Navy MD on 03/28/2010   Method used:   [facility]ronically to        [facility] [phone]* (retail)       [address]       [place], [place]  [address]       Ph: [phone] or [phone]       Fax: [phone]   RxID:   [id number] VERAPAMIL HCL CR 240 MG CR-TABS (VERAPAMIL HCL) 1 by mouth once daily  #30 x 1   Entered and Authorized by:   Jacques Navy MD   Signed by:   Jacques Navy MD on 03/28/2010   Method used:   [facility]        [facility]  [address] [phone]* (retail)       [phone]  [address]       [place], [place]  [address]       Ph: [phone] or [phone]       Fax: [phone]   RxID:   [id number]

## 2010-08-09 NOTE — Assessment & Plan Note (Signed)
Summary: 2 mth f/u per checkout on 04/25/10/tg   Visit Type:  Follow-up Primary Provider:  Dr.Norins   History of Present Illness: [name] is a pleasant 77 y/o CF pt of Dr. Ladona Ridgel and Dr. Dietrich Pates that we are following for continued assessment and treatment of SVT, CAD, and hypertension.  She has been seen last by Dr. Ladona Ridgel and had continued complaints of HR elevations.  On the last visit Dr. Ladona Ridgel added digoxin, changed metoprolol to coreg 6.25mg  BID, and decreased verapamil to 180mg  daily.  She states that she has had no futher complaints of racing heart rate, but has had some fullness in her head when she bends over or stoops.  She has complaints of numbness and tingling on the right side of her body which she attributes to neuralgia.  She is trying to be as independent as possible and is reluctant to ask for help from son's when she does not feel strong enough to do yard work or lift heavy objects.  Current Medications (verified): 1)  Glucosamine Complex   Tabs (Nutritional Supplements) .... Take One Tablet Once Daily 2)  Welchol 625 Mg  Tabs (Colesevelam Hcl) .... Take 3 Tablets By Mouth Two Times A Day 3)  Nexium 40 Mg  Cpdr (Esomeprazole Magnesium) .... Take One Tablet Daily 4)  Alprazolam 0.25 Mg  Tabs (Alprazolam) .... Take 1/2t Tablet As Needed 5)  Cvs Aspirin Ec 325 Mg  Tbec (Aspirin) .Marland Kitchen.. 1 By Mouth Once Daily 6)  Verapamil Hcl Cr 180 Mg Cr-Tabs (Verapamil Hcl) .... Take 1 Tablet By Mouth Once Daily 7)  Fish Oil 1000 Mg Caps (Omega-3 Fatty Acids) .... Take 1 Tablet By Mouth Once A Day 8)  Vitamin D 1000 Unit Tabs (Cholecalciferol) .... Take 1 Tablet By Mouth Once A Day 9)  Nitrostat 0.4 Mg Subl (Nitroglycerin) .Marland Kitchen.. 1 Tablet Under Tongue At Onset of Chest Pain; You May Repeat Every 5 Minutes For Up To 3 Doses. 10)  Pradaxa 150 Mg Caps (Dabigatran Etexilate Mesylate) .Marland Kitchen.. 1 By Mouth Two Times A Day For Stroke Protection in A. Fib 11)  Carvedilol 6.25 Mg Tabs (Carvedilol) ....  Take One Tablet By Mouth Twice A Day 12)  Digoxin 0.125 Mg Tabs (Digoxin) .... Take 1 Tablet By Mouth Once A Day  Allergies: 1)  ! * Pain Med Pt Unsure Of. 2)  ! Hydrocodone  Comments:  Nurse/Medical Assistant: patient has stopped multi vit and red yeast rice per patient all the other meds are correct on the previous ov med list [facility] [place]  Review of Systems       Fullness in her head when stooping or bending over.  All other systems have been reviewed and are negative unless stated above.   Vital Signs:  Patient profile:   77 year old female Weight:      163 pounds O2 Sat:      95 % on Room air Pulse rate:   70 / minute BP sitting:   112 / 80  (right arm)  Vitals Entered By: Dreama Saa, CNA (May 31, 2010 1:35 PM)  O2 Flow:  Room air  Physical Exam  General:  Well developed, well nourished, in no acute distress. Lungs:  Clear bilaterally to auscultation and percussion. Heart:  Non-displaced PMI, chest non-tender;ir regular rate and rhythm,  without murmurs, rubs or gallops. Carotid upstroke normal, no bruit. Normal abdominal aortic size, no bruits. Femorals normal pulses, no bruits. Pedals normal pulses. No edema, no  varicosities. Abdomen:  Bowel sounds positive; abdomen soft and non-tender without masses, organomegaly, or hernias noted. No hepatosplenomegaly. Msk:  Back normal, normal gait. Muscle strength and tone normal. Pulses:  pulses normal in all 4 extremities Extremities:  No clubbing or cyanosis. Neurologic:  Alert and oriented x 3. Psych:  Normal affect.   Impression & Recommendations:  Problem # 1:  ATRIAL FIBRILLATION (ICD-427.31) HR is well controlled at present on current medications.  CHAD's score is 2 for age and hypertension.She is on ASA 325mg .   She remains off of coumadin. She is tolerating the new medications without complaint.  She is encouraged to ask for help with heavy exertion. Her updated medication list for  this problem includes:    Cvs Aspirin Ec 325 Mg Tbec (Aspirin) .Marland Kitchen... 1 by mouth once daily    Carvedilol 6.25 Mg Tabs (Carvedilol) .Marland Kitchen... Take one tablet by mouth twice a day    Digoxin 0.125 Mg Tabs (Digoxin) .Marland Kitchen... Take 1 tablet by mouth once a day  Problem # 2:  CORONARY ARTERY DISEASE (ICD-414.00) Denies chest pain at present.  She remains active and fiercely independent.  Recent stress myoview was negative for ischemia. Her updated medication list for this problem includes:    Cvs Aspirin Ec 325 Mg Tbec (Aspirin) .Marland Kitchen... 1 by mouth once daily    Verapamil Hcl Cr 180 Mg Cr-tabs (Verapamil hcl) .Marland Kitchen... Take 1 tablet by mouth once daily    Nitrostat 0.4 Mg Subl (Nitroglycerin) .Marland Kitchen... 1 tablet under tongue at onset of chest pain; you may repeat every 5 minutes for up to 3 doses.    Carvedilol 6.25 Mg Tabs (Carvedilol) .Marland Kitchen... Take one tablet by mouth twice a day  Patient Instructions: 1)  Your physician recommends that you schedule a follow-up appointment in: 6 months 2)  Your physician recommends that you continue on your current medications as directed. Please refer to the Current Medication list given to you today.

## 2010-08-09 NOTE — Assessment & Plan Note (Signed)
Summary: rov/new onset afib/jml   Visit Type:  Initial Consult Primary Provider:  Norins  CC:  new onset afib.  History of Present Illness: [name] is a 77 y/o CF with known history of palpatations and SVT who is followed by Dr. Lewayne Bunting.  She also has a history of hypertension, CAD non-obstructive per cath 2007, GERD who was seen by Dr.Norins on 03/28/2010 for complaints of tachycardia and low diastolic BP with chest pressure. Marland Kitchen  An EKG was obtained and demonstrated Afib with RVR with a rate of 116 bpm. This was a new diagnosis.  Dr. Arthur Holms spoke with Dr. Tonny Bollman who was DOD that day, as he was unable to speak with Dr. Ladona Ridgel.  Dr. Excell Seltzer advised Dr. Arthur Holms to increase verapamil to 240mg  once a day from  180mg  daily, to add metoprolol 25mg  two times a day, and start Pradaxa 150mg  two times a day. She is here for follow-up.   She is slightly confused about whether she is taking her medications regularly, as she is often distracted by her husbands health issues and caring for him.  She can become quite anxious at times and feels this is contributing to her heart rate.  She denies active chest pain or SOB, and can feel that her heart is out of rhythm.  Current Medications (verified): 1)  Arimidex 1 Mg  Tabs (Anastrozole) .... Take One Tablet Once Daily 2)  Glucosamine Complex   Tabs (Nutritional Supplements) .... Take One Tablet Once Daily 3)  Welchol 625 Mg  Tabs (Colesevelam Hcl) .... Take 3 Tablets By Mouth Two Times A Day 4)  Nexium 40 Mg  Cpdr (Esomeprazole Magnesium) .... Take One Tablet Daily 5)  Bl Ibuprofen 200 Mg  Tabs (Ibuprofen) .... Take One Tablet Daily 6)  Alprazolam 0.25 Mg  Tabs (Alprazolam) .... Take 1/2t Tablet As Needed 7)  Cvs Aspirin Ec 325 Mg  Tbec (Aspirin) .Marland Kitchen.. 1 By Mouth Once Daily 8)  Verapamil Hcl Cr 240 Mg Cr-Tabs (Verapamil Hcl) .Marland Kitchen.. 1 By Mouth Once Daily 9)  Fish Oil 1000 Mg Caps (Omega-3 Fatty Acids) .... Take 1 Tablet By Mouth Once A Day 10)  Red  Yeast Rice Extract 600 Mg Caps (Red Yeast Rice Extract) .... Take 1-2 Tablet By Mouth Once A Day 11)  Multivitamins  Tabs (Multiple Vitamin) .... Take 1 Tablet By Mouth Once A Day 12)  Vitamin C 500 Mg Tabs (Ascorbic Acid) .... Take 1 Tablet By Mouth Once A Day As Needed 13)  Vitamin D 1000 Unit Tabs (Cholecalciferol) .... Take 1 Tablet By Mouth Once A Day 14)  Nitrostat 0.4 Mg Subl (Nitroglycerin) .Marland Kitchen.. 1 Tablet Under Tongue At Onset of Chest Pain; You May Repeat Every 5 Minutes For Up To 3 Doses. 15)  Metoprolol Tartrate 25 Mg Tabs (Metoprolol Tartrate) .Marland Kitchen.. 1 By Mouth Two Times A Day F0r Heart Rate 16)  Pradaxa 150 Mg Caps (Dabigatran Etexilate Mesylate) .Marland Kitchen.. 1 By Mouth Two Times A Day For Stroke Protection in A. Fib  Allergies (verified): 1)  ! * Pain Med Pt Unsure Of. 2)  ! Hydrocodone  Past History:  Past medical, surgical, family and social histories (including risk factors) reviewed, and no changes noted (except as noted below).  Past Medical History: Reviewed history from 11/24/2009 and no changes required.  Hx of HERPES ZOSTER (ICD-053.9) PALPITATIONS, RECURRENT (ICD-785.1) CORONARY ARTERY DISEASE (ICD-414.00) HYPERLIPIDEMIA (ICD-272.4) * Hx of POLIO AT AGE 73 PERIPHERAL NEUROPATHY (ICD-356.9) CARPAL TUNNEL RELEASE, HX OF (ICD-V45.89) HIATAL  HERNIA, HX OF (ICD-V12.79) ANXIETY (ICD-300.00) HYPERTENSION (ICD-401.9) GERD (ICD-530.81) CARCINOMA, BREAST, HX OF (ICD-V10.3) Coronary artery disease - cath Feb '07- no obstructive dz - 20% proximal LAD only; EF 65% diastolic dysfunction Peripheral neuropathy   Physican Roster:                oncology ---Dr. Cyndie Chime                gyn -           Dr. Duane Lope  Past Surgical History: Reviewed history from 11/11/2007 and no changes required. MASTECTOMY, RADICAL, WITH AXILLARY LYMPH NODES, HX OF LEFT (ICD-V45.71) CHOLECYSTECTOMY, HX OF (ICD-V45.79) '99    Family History: Reviewed history from 08/19/2007 and no changes  required. mother with heart disease father died with multiple myeloma 1 sister with lung cancer 1 sister with dialysis 1 brother with renal cell cancer 1 brother with lymphoma sister with DM  Social History: Reviewed history from 11/11/2007 and no changes required. 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   Vital Signs:  Patient profile:   77 year old female Weight:      163 pounds BMI:     30.91 Pulse rate:   114 / minute BP sitting:   98 / 65  (right arm)  Vitals Entered By: Shelby Saa, CNA (March 31, 2010 1:56 PM)  Physical Exam  General:  Well developed, well nourished, in no acute distress. Lungs:  Clear bilaterally to auscultation and percussion. Heart:  Irregular without MRG Abdomen:  Bowel sounds positive; abdomen soft and non-tender without masses, organomegaly, or hernias noted. No hepatosplenomegaly. Pulses:  pulses normal in all 4 extremities Extremities:  trace left pedal edema and trace right pedal edema.   Neurologic:  Alert and oriented x 3. Psych:  anxious.     EKG  Procedure date:  03/31/2010  Findings:      Atrial fibrillation with a controlled ventricular response rate of: 103  Impression & Recommendations:  Problem # 1:  ATRIAL FIBRILLATION (ICD-427.31) New onset per Dr. Arthur Holms.  I do not have prior EKG to compare but review of Dr. Lubertha Basque note demonstrates that she has been having episodes of SVT only.  Her BP is low on this visit with the increase dose of Verapamil and addtion of metorprolol, but HR is still not completely controlled.  I am uncertain if this HR is related to anxiety or if this is her baseline on the medications. She is uncertain if she took her medications this morning.  I have advised her to stop taking the Altace and continue the other medications.  I do not  want to go up on the metoprolol at this time because of hypotension.  I am getting an echo, and review of records does not have a report, and she says that it has been many years since having one.  She will wear a 48 hr monitor to evaluate heart rate while not in this clinic.  She will follow-up with Dr. Ladona Ridgel in 1-2 weeks to discuss the results and for his recommendations an medications. Her updated medication list for this problem includes:    Cvs Aspirin Ec 325 Mg Tbec (Aspirin) .Marland Kitchen... 1 by mouth once daily    Metoprolol Tartrate 25 Mg Tabs (Metoprolol tartrate) .Marland Kitchen... 1 by mouth two  times a day f0r heart rate  Orders: 2-D Echocardiogram (2D Echo) Holter Monitor (Holter Monitor) Nuclear Stress Test (Nuc Stress Test)  Problem # 2:  CORONARY ARTERY DISEASE (ICD-414.00) Last catherization in 2007 demonstrated non-occlusive CAD,  She has a history of coronary spasm elicited chest pain which had been controlled with Verapamil.  I will repeat stress test to evaluate for evidence of ischemia. She will follow up with Dr.  Ladona Ridgel in 1-2 weeks. The following medications were removed from the medication list:    Altace 5 Mg Caps (Ramipril) .Marland Kitchen... Take one capsule daily Her updated medication list for this problem includes:    Cvs Aspirin Ec 325 Mg Tbec (Aspirin) .Marland Kitchen... 1 by mouth once daily    Verapamil Hcl Cr 240 Mg Cr-tabs (Verapamil hcl) .Marland Kitchen... 1 by mouth once daily    Nitrostat 0.4 Mg Subl (Nitroglycerin) .Marland Kitchen... 1 tablet under tongue at onset of chest pain; you may repeat every 5 minutes for up to 3 doses.    Metoprolol Tartrate 25 Mg Tabs (Metoprolol tartrate) .Marland Kitchen... 1 by mouth two times a day f0r heart rate  Patient Instructions: 1)  Your physician recommends that you schedule a follow-up appointment in: 2 weeks with Dr. Ladona Ridgel 2)  Your physician has recommended you make the following change in your medication:  stop altace 3)  Your physician has requested that you have an echocardiogram.   Echocardiography is a painless test that uses sound waves to create images of your heart. It provides your doctor with information about the size and shape of your heart and how well your heart's chambers and valves are working.  This procedure takes approximately one hour. There are no restrictions for this procedure. 4)  Your physician has recommended that you wear a holter monitor.  Holter monitors are medical devices that record the heart's electrical activity. Doctors most often use these monitors to diagnose arrhythmias. Arrhythmias are problems with the speed or rhythm of the heartbeat. The monitor is a small, portable device. You can wear one while you do your normal daily activities. This is usually used to diagnose what is causing palpitations/syncope (passing out). 5)  Your physician has requested that you have an adenosine myoview.  For further information please visit [email or web address]  Please follow instruction sheet, as given.  Appended Document: rov/new onset afib/jml Patient of Dr. Ladona Ridgel, seen independently by Ms. Lawrence today.

## 2010-08-09 NOTE — Letter (Signed)
Summary: [facility], [facility] at [employer]  [address], [place] [address]   Phone: [phone]  Fax: [phone]     April 05, 2010 MRN: [id number]   [name] [address] [place], [place]  [address]      YOUR LAB WORK IS DUE  when you recieve this letter _________________________________________  Please go to [facility], located across the street from [facility] on the second floor.  Hours are Monday - Friday 7am until 7:30pm         Saturday 8am until 12noon    __  DO NOT EAT OR DRINK AFTER MIDNIGHT EVENING PRIOR TO LABWORK  _X_ YOUR LABWORK IS NOT FASTING --YOU MAY EAT PRIOR TO LABWORK

## 2010-08-09 NOTE — Letter (Signed)
    [facility] [address] [place], [place]  [address] Phone: [phone]      Nov 27, 2009   [name] [address] [place], [place] [address]  RE:  LAB RESULTS  Dear  [name],  The following is an interpretation of your most recent lab tests.  Please take note of any instructions provided or changes to medications that have resulted from your lab work.   [facility]: normal blood chemistries and liver functions; Cholesterol 240, HDL (good cholesterol) 57; LDL (bad cholesterol) 144; A1C 5.9% (normal blood sugar test)  The cholesterol is higher than goal of LDL 130 or less. This should improve if you increase the Welchol as we discussed and continue on the red yeast rice.  No evidence of diabetes.   Sincerely Yours,    Jacques Navy MD

## 2010-08-11 NOTE — Assessment & Plan Note (Signed)
Summary: ?BRONCHITIS-LB   Vital Signs:  Patient profile:   77 year old female Height:      61 inches Weight:      165 pounds O2 Sat:      97 % on Room air Temp:     96.7 degrees F oral Pulse rate:   69 / minute BP sitting:   100 / 60  (right arm) Cuff size:   regular  Vitals Entered By: Bill Salinas CMA (June 16, 2010 2:50 PM)  O2 Flow:  Room air CC: pt c/o cough x 1 month/ ab   Primary Care [facility] A[facility]:  Dr.Norins  CC:  pt c/o cough x 1 month/ ab.  History of Present Illness: Patinet has a month history of cough. She was seen in Urgent care and was treated for URI with antibiotics and benzonatate. She has continued to have cough - tickle type cough. worse at night. No fever, no sputum, no SOB.   Current Medications (verified): 1)  Glucosamine Complex   Tabs (Nutritional Supplements) .... Take One Tablet Once Daily 2)  Welchol 625 Mg  Tabs (Colesevelam Hcl) .... Take 3 Tablets By Mouth Two Times A Day 3)  Nexium 40 Mg  Cpdr (Esomeprazole Magnesium) .... Take One Tablet Daily 4)  Alprazolam 0.25 Mg  Tabs (Alprazolam) .... Take 1/2t Tablet As Needed 5)  Cvs Aspirin Ec 325 Mg  Tbec (Aspirin) .Marland Kitchen.. 1 By Mouth Once Daily 6)  Verapamil Hcl Cr 180 Mg Cr-Tabs (Verapamil Hcl) .... Take 1 Tablet By Mouth Once Daily 7)  Fish Oil 1000 Mg Caps (Omega-3 Fatty Acids) .... Take 1 Tablet By Mouth Once A Day 8)  Vitamin D 1000 Unit Tabs (Cholecalciferol) .... Take 1 Tablet By Mouth Once A Day 9)  Nitrostat 0.4 Mg Subl (Nitroglycerin) .Marland Kitchen.. 1 Tablet Under Tongue At Onset of Chest Pain; You May Repeat Every 5 Minutes For Up To 3 Doses. 10)  Pradaxa 150 Mg Caps (Dabigatran Etexilate Mesylate) .Marland Kitchen.. 1 By Mouth Two Times A Day For Stroke Protection in A. Fib 11)  Carvedilol 6.25 Mg Tabs (Carvedilol) .... Take One Tablet By Mouth Twice A Day 12)  Digoxin 0.125 Mg Tabs (Digoxin) .... Take 1 Tablet By Mouth Once A Day  Allergies (verified): 1)  ! * Pain Med Pt Unsure Of. 2)  ! Hydrocodone PMH-FH-SH  reviewed-no changes except otherwise noted  Review of Systems       The patient complains of prolonged cough.  The patient denies anorexia, fever, hoarseness, chest pain, dyspnea on exertion, hemoptysis, abdominal pain, muscle weakness, and enlarged lymph nodes.    Physical Exam  General:  Well-developed,well-nourished,in no acute distress; alert,appropriate and cooperative throughout examination Head:  normocephalic and atraumatic.   Eyes:  C&S clear, PERRLA Mouth:  throat clear Lungs:  normal respiratory effort, normal breath sounds, no crackles, and no wheezes.   Heart:  normal rate and regular rhythm.     Impression & Recommendations:  Problem # 1:  COUGH (ICD-786.2) probable cyclical cough.   Plan - benxonatate three times a day; phenrgan codien; prednisone burst and taper.   Complete Medication List: 1)  Glucosamine Complex Tabs (Nutritional supplements) .... Take one tablet once daily 2)  Welchol 625 Mg Tabs (Colesevelam hcl) .... Take 3 tablets by mouth two times a day 3)  Nexium 40 Mg Cpdr (Esomeprazole magnesium) .... Take one tablet daily 4)  Alprazolam 0.25 Mg Tabs (Alprazolam) .... Take 1/2t tablet as needed 5)  Cvs Aspirin Ec 325 Mg  Tbec (Aspirin) .Marland Kitchen.. 1 by mouth once daily 6)  Verapamil Hcl Cr 180 Mg Cr-tabs (Verapamil hcl) .... Take 1 tablet by mouth once daily 7)  Fish Oil 1000 Mg Caps (Omega-3 fatty acids) .... Take 1 tablet by mouth once a day 8)  Vitamin D 1000 Unit Tabs (Cholecalciferol) .... Take 1 tablet by mouth once a day 9)  Nitrostat 0.4 Mg Subl (Nitroglycerin) .Marland Kitchen.. 1 tablet under tongue at onset of chest pain; you may repeat every 5 minutes for up to 3 doses. 10)  Pradaxa 150 Mg Caps (Dabigatran etexilate mesylate) .Marland Kitchen.. 1 by mouth two times a day for stroke protection in a. fib 11)  Carvedilol 6.25 Mg Tabs (Carvedilol) .... Take one tablet by mouth twice a day 12)  Digoxin 0.125 Mg Tabs (Digoxin) .... Take 1 tablet by mouth once a day 13)  Benzonatate  100 Mg Caps (Benzonatate) .Marland Kitchen.. 1 by mouth three times a day 14)  Prednisone 10 Mg Tabs (Prednisone) .... 3 tabs q d x 3, 2 tabs once daily x 3, 1 tab once daily x 6 15)  Promethazine-codeine 6.25-10 Mg/4ml Syrp (Promethazine-codeine) .Marland Kitchen.. 1 tsp q 6 as needed cough  Patient Instructions: 1)  cough - post-infectious cyclicial cough. Plan - benzonatate 100mg  three times a day x 10 days, promethazine with codeine cough syrup 1 tsp every 6 hours as needed;  prednisone burst and taper as instructed.  Prescriptions: PROMETHAZINE-CODEINE 6.25-10 MG/5ML SYRP (PROMETHAZINE-CODEINE) 1 tsp q 6 as needed cough  #8 oz x 1   Entered and Authorized by:   Jacques Navy MD   Signed by:   Jacques Navy MD on 06/16/2010   Method used:   Handwritten   RxID:   [id number] PREDNISONE 10 MG TABS (PREDNISONE) 3 tabs q d x 3, 2 tabs once daily x 3, 1 tab once daily x 6  #21 x 0   Entered and Authorized by:   Jacques Navy MD   Signed by:   Jacques Navy MD on 06/16/2010   Method used:   Electronically to        [facility] [phone]* (retail)       [address]       [place], [place]  [address]       Ph: [phone] or [phone]       Fax: [phone]   RxID:   [id number] BENZONATATE 100 MG CAPS (BENZONATATE) 1 by mouth three times a day  #30 x 1   Entered and Authorized by:   Jacques Navy MD   Signed by:   Jacques Navy MD on 06/16/2010   Method used:   Electronically to        [facility] [phone]* (retail)       [address]       [place], [place]  [address]       Ph: [phone] or [phone]       Fax: [phone]   RxID:   [id number]    Orders Added: 1)  Est. Patient Level III [[address]]

## 2010-09-06 ENCOUNTER — Other Ambulatory Visit: Payer: Self-pay | Admitting: Obstetrics and Gynecology

## 2010-10-26 ENCOUNTER — Other Ambulatory Visit: Payer: Self-pay | Admitting: *Deleted

## 2010-10-26 MED ORDER — CARVEDILOL 6.25 MG PO TABS: 6.2500 mg | ORAL_TABLET | Freq: Two times a day (BID) | ORAL | Status: DC

## 2010-11-22 NOTE — H&P (Signed)
NAMEKENSLY, BOWMER NO.:  [id number]   MEDICAL RECORD NO.:  [id number]          PATIENT TYPE:  EMS   LOCATION:  [facility]                         FACILITY:  [facility]   PHYSICIAN:  Doylene Canning. Ladona Ridgel, MD    DATE OF BIRTH:  02/02/1932   DATE OF ADMISSION:  01/25/2008  DATE OF DISCHARGE:                              HISTORY & PHYSICAL   ADMITTING CHIEF COMPLAINT:  Chest pain x2 weeks.   HISTORY OF PRESENTING ILLNESS:  The patient is a 77 year old white  female with history of nonobstructive coronary artery disease by last  cath in 2007 with a long-standing history of atypical chest discomfort  for years.  Today, the patient reports that she has had increasing  frequency of this over the past 2 weeks with a crescendo today of this  chest discomfort described as pressure like.  Usually, goes away with  her Xanax.  However, today it did not.  The patient reports she is going  on a trip, and was concerned that she wanted to get her heart checked  out before she went on this trip on Tuesday.  Denies any associated  symptoms of shortness of breath, radiation of pain, syncope, or  presyncope.  She reports that this tightness has been ongoing, constant  over the past 2 weeks, and is now 1/10 in severity.   PAST MEDICAL HISTORY:  1. Nonobstructive coronary artery disease by left heart      catheterization with chronic noncardiac chest pain.  2. Status post mastectomy.  3. Hypertension.   SOCIAL HISTORY:  Lives in [place].  No alcohol.  No drugs.  No tobacco abuse.   FAMILY HISTORY:  Reviewed.  Noncontributory medical condition.   ALLERGIES:  No known drug allergies.   CURRENT MEDICATIONS:  1. Altace 5 mg.  2. Nexium.  3. WelChol.  4. Multivitamin.   REVIEW OF SYSTEMS:  Negative for 10-point review of systems except for  those dictated in the above HPI.   PHYSICAL EXAMINATION:  VITAL SIGNS:  Blood pressure is 127/78, heart  rate of 75, respirations of 12.  GENERAL:  Well-developed and well-nourished white female in no acute  distress.  HEENT:  Moist mucous membranes.  No scleral or conjunctival pallor.  NECK:  Supple.  Full range of motion.  No jugular venous distention.  No  carotid bruits.  CARDIOVASCULAR:  Regular rate and rhythm.  No rubs, murmurs, or gallops.  CHEST:  Clear to auscultation bilaterally.  No wheezes, rales, or  rhonchi.  ABDOMEN:  Soft, nontender, and nondistended.  Normoactive bowel sounds.  EXTREMITIES:  No peripheral edema.  Pulses 2+ bilaterally.  NEUROLOGIC:  Alert and oriented x3.  Cranial nerves II through XII  grossly intact and otherwise nonfocal exam.   CHEST X-RAY:  No acute infiltrative process.  EKG, normal sinus rhythm  with occasional PVC and nonspecific ST-T wave abnormality.  Unchanged  from her prior ECG.   LABORATORY DATA:  Hemoglobin of 14.3, BUN and creatinine of 19 and 1.1.  Biomarkers are negative.  D-dimer is negative.   IMPRESSION:  1. Atypical  chest pain.  2. History of hypertension.  3. History of nonobstructive coronary artery disease by last cath in      2007.   PLAN:  Rule out for myocardial infarction with serial biomarkers.  Given  I think that she has more atypical chest pain, I think that less likely  coronary syndrome will not heparinize at this point.  Just continue  aspirin at full dose for now, Altace, WelChol, and rest of her  medications.  Admit her under observation.  Pending her biomarker trend,  I think she can probably safely discharge for an outpatient stress test  in the a.m.      Vernice Jefferson, MD   Electronically Signed     ______________________________  Doylene Canning. Ladona Ridgel, MD    JT/MEDQ  D:  01/25/2008  T:  01/26/2008  Job:  [id number]

## 2010-11-22 NOTE — Assessment & Plan Note (Signed)
[facility]                         ELECTROPHYSIOLOGY OFFICE NOTE   NAME:[name], [name]                       MRN:          [id number]  DATE:01/08/2008                            DOB:          1932-03-20    Mr. [name] returns today for followup.  She is referred today for  evaluation by Dr. Debby Bud.  She is a very pleasant 77 year old woman with  a history of palpitations who has documented SVT on cardiac monitoring.  The patient states that her episodes start suddenly when they terminate,  they typically terminate relatively gradually.  She has 1 perhaps 2  episodes month of this.  The longest one is lasted approximately 60-80  minutes.  She feels her heart racing up to 150 beats per minute.  She  has never had frank syncope with this.  She denies chest pain or  shortness of breath.   PAST MEDICAL HISTORY:  Notable for breast cancer status post mastectomy,  has a history of gallbladder disease and status post cholecystectomy.   FAMILY HISTORY:  Notable for both parents being deceased.  Her mother in  her 34s of heart failure and her father of myeloma in his 49s.   MEDICATIONS:  Include, Altace 5 mg daily, Nexium 40 mg daily, WelChol,  glucosamine, and multiple vitamins.   SOCIAL HISTORY:  The patient is married.  She does not work outside the  home.  She denies tobacco use.  She denies alcohol abuse.   REVIEW OF SYSTEMS:  Notable for some nonexertional chest discomfort and  sensation of palpitations.  She has a history of anxiety, which is  questionably related to her SVT.  She has a history of reflux symptoms.   PHYSICAL EXAMINATION:  GENERAL:  She is a pleasant well-appearing 77-  year-old woman in no acute distress.  VITAL SIGNS:  Blood pressure today was 112/70, the pulse 78 and regular,  the respirations were 18, the weight was 180 pounds.  HEENT:  Normocephalic and atraumatic.  Pupils equal and round.  The  oropharynx was moist.  The  sclerae anicteric.  NECK:  Revealed no jugular venous distention.  There are no thyromegaly.  Trachea is midline.  The carotids are 2+ and symmetric.Marland Kitchen  LUNGS:  Clear bilaterally auscultation.  No wheezes, rales or rhonchi  are present.  There is no increased work of breathing.  CARDIOVASCULAR:  Regular rate and rhythm.  Normal S1 and S2.  No  murmurs, rubs or gallops present.  PMI was not enlarged nor was it  laterally displaced.  ABDOMEN:  Soft, nontender, nondistended.  There is no organomegaly.  Bowel sounds are present.  There is no rebound or guarding.  EXTREMITIES:  No cyanosis, clubbing or edema.  The pulses are 2+ and  symmetric.  NEUROLOGIC:  Alert and oriented x3.  Cranial nerves intact.  Strength is  5/5 and symmetric.   The EKG demonstrates sinus rhythm, normal axis and intervals.  Review of  the patient's cardiac monitor demonstrates the sinus rhythm and sinus  tachycardia with occasional PVCs.  There is also a narrow short RP  tachycardia  at rates of up to 160 beats per minute, which I suspect is  SVT.  It is very regular, it could be flutter, but I do not think it is  atrial fibrillation.   IMPRESSION:  1. Palpitations.  2. Probable supraventricular tachycardia though it cannot rule out      atrial flutter.  3. Hypertension.   DISCUSSION:  I have discussed the treatment options with [name] in  detail.  I have recommended initiation of beta-blocker therapy to help  control her symptoms.  Obviously, she is intolerant to this, then  another drug could be tried or consideration for catheter ablation be  made.  I will see her back in several months.     Doylene Canning. Ladona Ridgel, MD  Electronically Signed    GWT/MedQ  DD: 01/08/2008  DT: 01/09/2008  Job #: [id number]   cc:   Rosalyn Gess. Norins, MD

## 2010-11-22 NOTE — Assessment & Plan Note (Signed)
[facility]                         ELECTROPHYSIOLOGY OFFICE NOTE   NAME:WARNERAnabel, [name]                       MRN:          [id number]  DATE:02/14/2008                            DOB:          1932-02-15    [name] returns today for followup.  She is a very pleasant woman  with a history of SVT, hypertension, and chest pain.  She has  catheterization demonstrating no coronary artery disease.  She probably  has GI reflux.  She returns today for followup.  I saw her back in early  July and at that time, we started her on beta-blockers, but she has  noted that she continues to have intermittent episodes of chest  discomfort, not related to exertion, not clearly related to food intake,  not related to her SVT and in fact, her SVT has markedly improved.  She  was observed now in the emergency room with negative enzymes and a  negative stress test.  She returns today for followup.   PHYSICAL EXAMINATION:  GENERAL:  She is a pleasant well-appearing woman,  in no acute distress.  VITAL SIGNS:  Blood pressure was 114/66, the pulse was 68 and regular,  the respirations were 18, the weight was 182 pounds.  NECK:  No jugular venous distention.  LUNGS:  Clear bilaterally to auscultation.  No wheezes, rales, or  rhonchi are present.  CARDIOVASCULAR:  Regular rate and rhythm.  Normal S1 and S2.  EXTREMITIES:  No edema.   IMPRESSION:  1. Supraventricular tachycardia, well controlled with medical therapy.  2. Recurrent chest pain, etiology unclear, perhaps secondary to      coronary vasospasm.   DISCUSSION:  I have discussed the treatment options with the patient.  I  have recommended I will switch her from beta-blocker to verapamil.  This  might help control her arrhythmias as well as her coronary spasm.  I  will plan to see the patient back in several months.  She is getting  nitroglycerin to take as needed for chest discomfort and she is on acid  suppression with Nexium.  Hopefully, all of the above will help control  her symptoms.  If she develops recurrent SVTs and catheter ablation, her  SVT would be considered as well, though it sounds  like she has not had  recurrent symptoms since being on beta-blockers.  We will have her stop  her beta-blockers in lieu of the calcium channel blocker.     Doylene Canning. Ladona Ridgel, MD  Electronically Signed    GWT/MedQ  DD: 02/14/2008  DT: 02/15/2008  Job #: [id number]

## 2010-11-22 NOTE — Discharge Summary (Signed)
NAMEMALONI, MUSLEH NO.:  [id number]   MEDICAL RECORD NO.:  [id number]          PATIENT TYPE:  INP   LOCATION:  2001                         FACILITY:  [facility]   PHYSICIAN:  Gerrit Friends. Dietrich Pates, MD, FACCDATE OF BIRTH:  1932-04-18   DATE OF ADMISSION:  01/25/2008  DATE OF DISCHARGE:  01/26/2008                               DISCHARGE SUMMARY   DISCHARGING DIAGNOSES:  1. Chest pain atypical, negative cardiac markers, EKG unremarkable  2..  Shore runs of supraventricular tachycardia noted on monitor.  1. Hypertension, stable.  2. History of nonobstructive coronary artery disease by cath.   The patient presented with complaints of chest discomfort ongoing for 2  weeks, admitted for observation.  Cardiac markers EKG as stated above.  Nexium increased to b.i.d.  Dr. Dietrich Pates rounded on the patient on day  of discharge noted show runs of SVT on monitor.  The patient being  discharged to home to follow up with Dr. Ladona Ridgel in 2-4 weeks for re-  evaluation.   Medications at time of discharge include Nexium which has been increased  to 40 mg b.i.d. nitroglycerin as needed, aspirin 325, ramipril 5 mg  daily, WelChol as previously prescribed.  The patient can resume her  previous vitamins and supplements as taken prior to this admission.   DURATION OF DISCHARGE ENCOUNTER:  Less than 30 minutes.      Dorian Pod, ACNP      Gerrit Friends. Dietrich Pates, MD, [facility]  Electronically Signed    MB/MEDQ  D:  01/26/2008  T:  01/27/2008  Job:  [id number]

## 2010-11-22 NOTE — Assessment & Plan Note (Signed)
[facility]                         ELECTROPHYSIOLOGY OFFICE NOTE   NAME:[name], [name]                       MRN:          [id number]  DATE:05/18/2008                            DOB:          1932/05/30    HISTORY OF PRESENT ILLNESS:  [name] returns today for followup.  She  is a very pleasant woman with a history of tachypalpitations and  documented SVT and hypertension.  She does not have a coronary artery  disease.  It was thought that her chest pain was related to GI reflux.  The patient was initially switched from beta blockers to calcium channel  blockers, as it was thought that her SVT might be controlled better and  her chest pain better controlled (we thought it was spasm) on verapamil.  She returns today for followup.  She has been off of metoprolol now  since August.  She has maintained sinus rhythm on verapamil ER 180 a day  and is improved.  She does still occasionally have dyspnea with  exertion, but otherwise had no specific complaints today.   MEDICATIONS:  Additional medications include  1. Altace 5 a day.  2. Arimidex 1 mg daily.  3. Glucosamine.  4. Nexium 40 a day.  5. Red yeast rice.  6. Multiple vitamins including vitamin B12, 1 g once a month.   PHYSICAL EXAMINATION:  GENERAL:  She is a pleasant woman, in no acute  distress.  VITAL SIGNS:  Blood pressure today was 130/82, pulse 80 and regular,  respirations were 18, the weight was 184 pounds.  NECK:  Revealed no jugular venous distention.  LUNGS:  Clear bilaterally to auscultation.  No wheezes, rales, or  rhonchi are present.  There is no increased work of breathing.  CARDIOVASCULAR:  Revealed a regular rate and rhythm.  Normal S1 and S2.  ABDOMEN:  Soft and nontender.  There is no organomegaly.  The bowel  sounds are present.  There is no rebound or guarding.  EXTREMITIES:  Demonstrate no cyanosis, clubbing, or edema.  Pulses are 2+ and  symmetric.   IMPRESSION:  1. Supraventricular tachycardia now well controlled with calcium      channel blockers.  2. Probable coronary spasm now controlled on verapamil.   DISCUSSION:  Overall, [name] is stable.  I will see her back in  several months.  She is instructed to call us if she has recurrent  symptoms.     Doylene Canning. Ladona Ridgel, MD  Electronically Signed   GWT/MedQ  DD: 05/18/2008  DT: 05/19/2008  Job #: [id number]

## 2010-11-22 NOTE — Assessment & Plan Note (Signed)
[facility]                                 ON-CALL NOTE   NAME:[name], [name]                       MRN:          [id number]  DATE:01/25/2008                            DOB:          12/05/1931    TELEPHONE CONVERSATION   PRIMARY CARDIOLOGIST:  Doylene Canning. Ladona Ridgel, MD   I received a page through the answering service from [name] at [phone] stating tightness in chest.  I promptly called [name] back.  She states she was at home and had recently seen Dr. Ladona Ridgel for  palpitations, could not remember what he said was wrong with her heart,  but has started her on Toprol.  Today, she has had intermittent  tightness substernally, not associated with any activity or position.  She denies being lightheaded, dizzy, diaphoretic, and nauseated.  She  states she is getting ready to go on vacation.  She wanted to speak with  someone before she left.  She was feeling like maybe the Toprol was  giving her the chest tightness.  She denies any wheezing or shortness of  breath with it.  I instructed [name] to come to the emergency room  to get evaluated, as I could not evaluate her by phone.  She states she  really do not want to do that.  I explained to her that there was no way  of knowing exactly what was going on if she was having tightness in her  chest that did not feel like GERD or was not associated with any  activity or position.  She needed to be evaluated for further diagnostic  purposes.  She agreed to come and get evaluated.      Dorian Pod, ACNP  Electronically Signed      Jonelle Sidle, MD  Electronically Signed   MB/MedQ  DD: 01/25/2008  DT: 01/25/2008  Job #: [id number]

## 2010-11-25 ENCOUNTER — Ambulatory Visit (INDEPENDENT_AMBULATORY_CARE_PROVIDER_SITE_OTHER): Payer: Medicare Other | Admitting: Cardiology

## 2010-11-25 ENCOUNTER — Encounter: Payer: Self-pay | Admitting: Cardiology

## 2010-11-25 DIAGNOSIS — R609 Edema, unspecified: Secondary | ICD-10-CM

## 2010-11-25 DIAGNOSIS — E785 Hyperlipidemia, unspecified: Secondary | ICD-10-CM

## 2010-11-25 DIAGNOSIS — I10 Essential (primary) hypertension: Secondary | ICD-10-CM

## 2010-11-25 NOTE — Op Note (Signed)
[place]. [facility]  Patient:    [name], [name]                       MRN: [id number] Proc. Date: 05/25/00 Adm. Date:  [id number] Disc. Date: [id number] Attending:  Rich Brave                           Operative Report  PREOPERATIVE DIAGNOSES: 1. Left breast carcinoma. 2. Acquired absence, left breast. 3. Obesity.  POSTOPERATIVE DIAGNOSES: 1. Left breast carcinoma. 2. Acquired absence, left breast. 3. Obesity.  OPERATION PERFORMED:  Vascular delay procedure for left transverse rectus abdominis myocutaneous flap.  SURGEON:  Alfredia Ferguson, M.D.  ANESTHESIA:  General endotracheal anesthesia.  INDICATION FOR SURGERY:  This is a 77 year old woman who is undergoing a left modified radical mastectomy today.  She was counseled regarding options for breast reconstruction.  She wishes to have autologous reconstruction.  Because the patient is approximately 40-50 pounds overweight, she was advised that she either needed to have a bipedicle TRAM flap or a vascular delay of a left-sided transverse rectus flap.  The patient opted to undergo a vascular delay.  The patient will undergo that procedure today and once she completes her chemotherapy, she will undergo ultimate rotation of the TRAM flap.  The patient understands the risks of infection, bleeding, unsightly scarring, seroma, and partial loss of the flap even though it has not been rotated.  In spite of these risks, the patient wishes to proceed with the operation.  DESCRIPTION OF PROCEDURE:  After the completion of the left-sided modified radical mastectomy, I was summoned to the operating room.  Skin marks had previously been placed, outlining the dimensions of the skin paddle.  The superior skin incision was made and deepened until it reached the anterior abdominal wall fascia.  The inferior incision of the skin paddle was made beginning at the midline and carrying it laterally toward the  left, meeting the lateral corner of my superior incision.  On the right side, I incised about 10 cm of the inferior skin incision.  I left the medial portion of the right side of the skin paddle non-incised.  The right corner of the skin paddle was undermined for a distance of approximately 3-4 cm.  The left corner was undermined until reaching the lateral border of the rectus muscle.  I u undermined over the right rectus anterior fascia beginning superiorly and lifted the skin paddle off of the anterior rectus fascia for a distance of approximately 6 cm, dividing at least one medial perforator on the right side. Inferiorly I left the bottom 7-8 cm still attached to the anterior rectus fascia.  The wound was now copiously irrigated with saline irrigation. Hemostasis was assured using electrocautery.  An incision was made inferiorly at the lateral rectus border beneath the skin paddle, through the anterior rectus fascia.  The lateral rectus muscle was visualized and retracted medially.  The fatty tissue was dissected until reaching the deep inferior epigastric vessels.  These vessels were ligated between hemoclips.  Both the artery and two veins were ligated and divided.  The anterior rectus fascia and the posterior rectus fascia at this level were closed using interrupted 0 Prolene figure-of-eight sutures.  The wound was again irrigated and inspected for hemostasis and once hemostasis had been assured, the wound was closed.  Using multiple interrupted 2-0 Vicryl sutures for the dermis, the skin  was united inferiorly and superiorly.  Skin edges were then united using a running 2-0 Vicryl subcuticular both inferiorly and superiorly.  The flap actually looked quite good at the conclusion of the procedure.  The abdomen was cleansed of Betadine and dried.  Steri-Strips were applied to the skin edges.  Light dressings were applied both on the mastectomy site and the TRAM flap site.  The patient  was then awakened, extubated, and transported to the recovery room in satisfactory condition. DD:  05/25/00 TD:  05/26/00 Job: [id number]

## 2010-11-25 NOTE — Progress Notes (Signed)
HPI This is a very pleasant 77 her old white female patient who has a history of SVT, CAD, and hypertension. She's been taking Coreg, verapamil, and digoxin for her SVT. She complains of feeling a little bit dizzy and fullness in her head after she takes these medicines. By the time it wears off it's time to take her second dose. She feels like she is being overmedicated and would like to stop one of them. She has no further complaints of palpitations. She has also developed some lower ankle edema over the past few days. She does eat a lot of salt. She is on her feet most of the day taking care of her sick husband.  She also is worried about the expense of WelChol and Pradaxa because her husband is dying of lung cancer and she will lose her prescription insurance.  Other than the above complaints she has done well without other cardiac complaints. She denies chest pain, palpitations, dyspnea, dyspnea on exertion.  Allergies  Allergen Reactions  . Hydrocodone     Current Outpatient Prescriptions on File Prior to Visit  Medication Sig Dispense Refill  . ALPRAZolam (XANAX) 0.25 MG tablet Take 0.25 mg by mouth at bedtime as needed.        . carvedilol (COREG) 6.25 MG tablet Take 1 tablet (6.25 mg total) by mouth 2 (two) times daily.  180 tablet  1  . Cholecalciferol (VITAMIN D) 1000 UNITS capsule Take 1,000 Units by mouth daily.        . colesevelam (WELCHOL) 625 MG tablet Take 1,875 mg by mouth 2 (two) times daily with a meal.        . dabigatran (PRADAXA) 150 MG CAPS Take 150 mg by mouth every 12 (twelve) hours.        . digoxin (LANOXIN) 0.125 MG tablet Take 125 mcg by mouth daily.        Marland Kitchen esomeprazole (NEXIUM) 40 MG capsule Take 40 mg by mouth daily before breakfast.        . Glucosamine-Chondroit-Vit C-Mn (GLUCOSAMINE 1500 COMPLEX) CAPS Take 1 capsule by mouth daily.        . nitroGLYCERIN (NITROSTAT) 0.4 MG SL tablet Place 0.4 mg under the tongue every 5 (five) minutes as needed.        .  verapamil (COVERA HS) 180 MG (CO) 24 hr tablet Take 180 mg by mouth at bedtime.        Marland Kitchen DISCONTD: aspirin 325 MG tablet Take 325 mg by mouth daily.        Marland Kitchen DISCONTD: benzonatate (TESSALON) 100 MG capsule Take 100 mg by mouth 3 (three) times daily as needed.        Marland Kitchen DISCONTD: Omega-3 Fatty Acids (FISH OIL) 1000 MG CAPS Take 1 capsule by mouth daily.        Marland Kitchen DISCONTD: predniSONE (DELTASONE) 10 MG tablet Take 10 mg by mouth daily.        Marland Kitchen DISCONTD: promethazine-codeine (PHENERGAN WITH CODEINE) 6.25-10 MG/5ML syrup Take 5 mLs by mouth every 4 (four) hours as needed.          Past Medical History  Diagnosis Date  . Herpes zoster   . Palpitations     RECURRENT (IC  . Coronary artery disease   . Hyperlipidemia   . Polio     * Hx of POLIO AT AGE 54  . Peripheral neuropathy   . S/P carpal tunnel release   . Hiatal hernia   . Anxiety   .  Hypertension   . GERD (gastroesophageal reflux disease)   . Carcinoma of breast   . Coronary artery disease      cath Feb '07- no obstructive dz - 20% proximal LAD only; EF 65%    Past Surgical History  Procedure Date  . Mastectomy      RADICAL, WITH AXILLARY LYMPH NODES, HX OF LEFT (ICD-V45.71)  . Cholecystectomy      HX OF    No family history on file.  History   Social History  . Marital Status: Married    Spouse Name: N/A    Number of Children: 2  . Years of Education: N/A   Occupational History  . homemaker    Social History Main Topics  . Smoking status: Never Smoker   . Smokeless tobacco: Never Used  . Alcohol Use: No  . Drug Use: No  . Sexually Active: Not on file   Other Topics Concern  . Not on file   Social History Narrative  . No narrative on file    ROS: See HPI Eyes: Negative Ears:Negative for hearing loss, tinnitus Cardiovascular: Negative for chest pain, palpitations,irregular heartbeat, dyspnea, dyspnea on exertion, near-syncope, orthopnea, paroxysmal nocturnal dyspnia and syncope,edema, claudication,  cyanosis,.  Respiratory:   Negative for cough, hemoptysis, shortness of breath, sleep disturbances due to breathing, sputum production and wheezing.   Endocrine: Negative for cold intolerance and heat intolerance.  Hematologic/Lymphatic: Negative for adenopathy and bleeding problem. Does not bruise/bleed easily.  Musculoskeletal: Negative.   Gastrointestinal: Negative for nausea, vomiting, reflux, abdominal pain, diarrhea, constipation.   Genitourinary: Negative for bladder incontinence, dysuria, flank pain, frequency, hematuria, hesitancy, nocturia and urgency.  Neurological: Negative.  Allergic/Immunologic: Negative for environmental allergies.   PHYSICAL EXAM Well-nournished, in no acute distress. Neck: No JVD, HJR, Bruit, or thyroid enlargement Lungs: No tachypnea, clear without wheezing, rales, or rhonchi Cardiovascular: irregular irregular, PMI not displaced, 2/6 systolic murmur at the left sternal border, no gallops, bruit, thrill, or heave. Abdomen: BS normal. Soft without organomegaly, masses, lesions or tenderness. Extremities:3 the bilateral lower extremities with a trace of edema bilaterally,without cyanosis, clubbing. Good distal pulses bilateral SKin: Warm, no lesions or rashes  Musculoskeletal: No deformities Neuro: no focal signs  BP 114/68  Pulse 77  Ht 5\' 1"  (1.549 m)  Wt 170 lb (77.111 kg)  BMI 32.12 kg/m2  SpO2 93%  ASSESSMENT AND PLAN:

## 2010-11-25 NOTE — Assessment & Plan Note (Signed)
Blood pressure is stable today. She is not orthostatic.

## 2010-11-25 NOTE — Consult Note (Signed)
NAMEJOVEE, DETTINGER NO.:  [id number]   MEDICAL RECORD NO.:  [id number]          PATIENT TYPE:  INP   LOCATION:  3703                         FACILITY:  [facility]   PHYSICIAN:  [place] Bing, M.D. [facility] OF BIRTH:  [birth year]   DATE OF CONSULTATION:  08/29/2005  DATE OF DISCHARGE:                                   CONSULTATION   REFERRING PHYSICIAN:  Dr. Debby Bud.   HISTORY OF PRESENT ILLNESS:  A 77 year old woman, with a history of  hyperlipidemia and a positive family history for cardiac disease, presents  with chest discomfort. Ms. Altic was previously evaluated by our cardiology  division in approximately the year 2000. She reportedly underwent a stress  test and echocardiogram with no significant pathologic findings. She did  well until the past few weeks when she noted intermittent substernal chest  pressure that was relatively mild. There was no relationship to exertion.  There was no chest wall tenderness. There were no associated symptoms. She  occasionally had associated jaw discomfort bilaterally. She tried to  increase her dose of Nexium without benefit. She has taken benzodiazepines  and aspirin for recent episodes with relief. She developed increased  discomfort at one this morning, which has not abated. She was seen in Dr.  Debby Bud' office and referred to [facility] for cardiology consultation and  admission. She continues to experience some discomfort, but does not  consider this severe enough to require specific treatment.   Past medical history is otherwise notable for cholecystectomy and excision  of her breast cancer with total dissection, radiation therapy and  chemotherapy in 2001. She had polio at age 66, but has no sequelae. There is  a history of GERD and hypertension.   The patient reports no allergies to medication. Her recent medical regime  has included ramipril 5 milligrams daily, Nexium 40 milligrams daily,  metoprolol 25  milligrams b.i.d., ramipril 5 milligrams daily, Arimidex 1  milligram daily, Welchol 3 tablets b.i.d.   SOCIAL HISTORY:  Married and lives locally; no tobacco products or alcohol.   FAMILY HISTORY:  Mother died in her 91s with congestive heart failure. No  prominent family history for coronary disease.   REVIEW OF SYSTEMS:  Notable for intermittent heartburn which is different  than her current symptoms. She is relatively active. All other systems  reviewed and are negative.   EXAM:  GENERAL: Pleasant, well-appearing woman.  VITAL SIGNS: Temperature is 97.1, blood pressure 130/70, heart rate 76 and  regular, respirations 20, O2 saturation 97% on room air.  HEENT: Anicteric sclerae; normal lids and conjunctivae.  SKIN: No significant lesions.  HEMATOPOIETIC: No adenopathy.  ENDOCRINE: No thyromegaly.  NECK: No jugular venous distension; normal carotid upstrokes without bruits.  LUNGS: Clear.  CARDIAC: Increased intensity of the first heart sounds; normal second heart  sounds; fourth heart sound and modest systolic murmur present.  ABDOMEN: Soft and nontender; no masses; no organomegaly; aortic pulsation  not palpable; no bruits.  EXTREMITIES: Distal pulses intact; no edema.  NEUROMUSCULAR: Symmetric strength and tone; normal cranial nerves.  MUSCULOSKELETAL: No joint deformities.  PSYCHIATRIC:  Alert and oriented; normal affect.   EKG: Normal sinus rhythm; rightward axis; minor nonspecific T-wave  abnormality; delayed R-wave progression.   Other laboratory pending.   IMPRESSION:  Ms. Mooty presents with somewhat atypical chest discomfort and  there is no relationship to exertion. Her cardiovascular risk is not  excessive; nonetheless, the history of associated jaw discomfort is of  concern. Heparin has been started. Nitroglycerin will be administered. Her  other usual cardiac medications will be continued. Serial cardiac markers  and EKGs have been requested. Since she has  responded to Xanax, this  medication will be continued at low-dose. If symptoms resolve, we will plan  to proceed with outpatient stress testing. If not, coronary angiography will  be warranted for a definitive diagnosis.   We greatly appreciate Dr. Debby Bud' request for cardiology consultation and  will be happy to follow this nice woman with the medical service.      [place] Bing, M.D. [facility]  Electronically Signed     RR/MEDQ  D:  08/29/2005  T:  08/29/2005  Job:  [id number]

## 2010-11-25 NOTE — Discharge Summary (Signed)
NAMEMARLENNE, RIDGE NO.:  [id number]   MEDICAL RECORD NO.:  [id number]          PATIENT TYPE:  INP   LOCATION:  3703                         FACILITY:  [facility]   PHYSICIAN:  Arvilla Meres, M.D. LHCDATE OF BIRTH:  31-Mar-1932   DATE OF ADMISSION:  08/29/2005  DATE OF DISCHARGE:  09/01/2005                                 DISCHARGE SUMMARY   PROCEDURES PERFORMED:  Cardiac catheterization performed on August 31, 2005, with left heart catheterization and left ventriculography.   PRINCIPAL DIAGNOSIS:  1.  Non-obstructive coronary artery disease.  2.  Normal left ventricular function with mildly increased left ventricular      end diastolic pressure consistent with diastolic dysfunction.   SECONDARY DIAGNOSIS:  1.  Peripheral neuropathy, patient being treated with chemotherapy.  2.  Carpal tunnel syndrome.  3.  Status post lumpectomy with nodal dissection.  4.  History of gastroesophageal reflux disease.   HISTORY OF PRESENT ILLNESS:  The patient is a 77 year old Caucasian female  who was admitted on August 29, 2005, with hyperlipidemia and a positive  family history and chest discomfort.  We were originally consulted for  evaluation of chest pain on Ms. Sheliah Hatch on August 29, 2005, by internal  medicine team, but subsequently took over care of the patient on August 31, 2005.   HOSPITAL COURSE:  The patient was admitted August 29, 2005, with some  atypical pain, however, it responded to nitroglycerin, plans for cardiac  catheterization to evaluate for chest pain with cardiac enzymes negative x  3, however, with risk factors.  The patient also has a history of GERD.  Internal medicine saw the patient during hospitalization and recommended the  patient have an outpatient GI workup related to questionable spasms.  August 31, 2005, Dr. Gala Romney performed a cardiac catheterization which  showed minimal nonobstructive coronary artery disease with normal  LV  function and mildly elevated LVEDP consistent with diastolic dysfunction,  recommended medical treatment.  The patient was somewhat hypotensive  February 22, was given IV bolus and responded well.  On September 01, 2005,  patient anxious to go home without any complaints, no chest pain, no  shortness of breath.  I discussed with Dr. Gala Romney, the patient will  follow up with [facility] on September 13, 2005, at 1:15 for post cath  visit.  She is also to follow up with Dr. Debby Bud and also to have a GI  workup as an outpatient.   DISCHARGE MEDICATIONS:  Lopressor 25 mg b.i.d., aspirin 325 mg daily,  Arimidex 1 mg daily, Welchol 1875 mg b.i.d., Protonix 80 mg daily, Xanax  0.25 mg t.i.d.     ______________________________  April Humphrey, NP      Arvilla Meres, M.D. [facility]  Electronically Signed    AH/MEDQ  D:  09/01/2005  T:  09/02/2005  Job:  [id number]   cc:   Rosalyn Gess. Norins, M.D. [facility]  [address]  [place]  [place] [address]

## 2010-11-25 NOTE — Assessment & Plan Note (Signed)
Patient has atrial fibrillation but is having symptoms on her rate controlling medications. She becomes dizzy and has a fullness in her head after taking them. We will stop her Coreg and Lanoxin and bring her back next week for rhythm strip. She is to call us if she has further palpitations.

## 2010-11-25 NOTE — Cardiovascular Report (Signed)
NAMEELAINE, ROANHORSE NO.:  [id number]   MEDICAL RECORD NO.:  [id number]          PATIENT TYPE:  INP   LOCATION:  3703                         FACILITY:  [facility]   PHYSICIAN:  Arvilla Meres, M.D. LHCDATE OF BIRTH:  [birth year]   DATE OF PROCEDURE:  08/31/2005  DATE OF DISCHARGE:                              CARDIAC CATHETERIZATION   PRIMARY CARE PHYSICIAN:  Dr. Illene Regulus.   CARDIOLOGIST:  Dr. Dietrich Pates.   PATIENT IDENTIFICATION:  Ms. Mukherjee is a 77 year old woman who was admitted  with chest pain concerning for unstable angina. She ruled out for myocardial  infarction with serial cardiac markers. However, she continued to have chest  pain with mild exertion and thus was referred for diagnostic angiography.   PROCEDURES PERFORMED:  1.  Selective coronary angiography.  2.  Left heart cath.  3.  Left ventriculogram.   DESCRIPTION OF PROCEDURE:  The risks and benefits of the procedure were  explained. Consent was signed and placed on the chart. A 6-French arterial  sheath was placed in the right femoral artery using a modified Seldinger  technique. Standard catheters including JR-4, JL-4 and angled pigtail were  used for the procedure. All catheter exchanges were made over wire. There  were no apparent complications. Central aortic pressure was 134/76, mean of  101. LV pressure was 122/8 with an LVEDP of 20. There was no aortic  stenosis.   Left main was normal.   LAD was a long vessel wrapping the apex. It gave off two small diagonals.  There was a 20% stenosis in the proximal LAD.   Left circumflex was a moderate-sized vessel made up primarily of a tortuous  and branching OM-1.   Right coronary artery was essentially a nondominant artery which gave off a  large acute marginal and a long RV branch. There was no angiographic CAD.   Left ventriculogram done in the RAO position showed an EF of 65% with no  wall motion abnormalities or mitral  regurgitation.   ASSESSMENT:  1.  Minimal nonobstructive coronary disease.  2.  Normal left ventricular function with mildly increased left ventricular      end-diastolic pressure consistent with diastolic dysfunction.   PLAN/DISCUSSION:  Will be for continued medical therapy and risk factor  management. She will be likely be able to go home in the morning if her  groin remains stable.      Arvilla Meres, M.D. [facility]  Electronically Signed     DB/MEDQ  D:  08/31/2005  T:  09/01/2005  Job:  [id number]

## 2010-11-25 NOTE — Patient Instructions (Signed)
Your physician has recommended you make the following change in your medication: stop taking Digoxin and Coreg  Your physician has requested that you start a 2 gram sodium diet, please see handout given to you at today's visit  Your physician recommends that you elevate legs as often as possible  Your physician recommends that you schedule a follow-up appointment in: next week for a Rhythm strip and in 4 months

## 2010-11-25 NOTE — Op Note (Signed)
NAME:  [name], [name]                          ACCOUNT NO.:  [id number]   MEDICAL RECORD NO.:  [id number]                   PATIENT TYPE:  AMB   LOCATION:  ENDO                                 [facility]ACILITY:  [facility]   PHYSICIAN:  Bernette Redbird, M.D.                DATE OF BIRTH:  1932/04/14   DATE OF PROCEDURE:  10/21/2003  DATE OF DISCHARGE:                                 OPERATIVE REPORT   PROCEDURE:  Colonoscopy.   INDICATIONS FOR PROCEDURE:  Heme positive stool in a 77 year old female.   FINDINGS:  Normal exam to the terminal ileum.   DESCRIPTION OF PROCEDURE:  The nature, purpose and risk of the procedure  have been previously discussed with the patient and she provided written  consent. Sedation for this procedure and the upper endoscopy which preceded  it totaled fentanyl 87.5 mcg and Versed 7 mg IV without arrhythmias or  desaturation.  The Olympus adjustable tension colonoscope was advanced to  the terminal ileum which had a normal appearance and pullback was then  performed.  The quality of the prep was excellent and it is felt that all  areas were well seen.   There was some mild muscular thickening and probable minimal diverticular  change in the sigmoid region.  No polyps, cancer, colitis or vascular  malformations were observed.  Retroflexion in the rectum showed moderate  internal hemorrhoids.   No biopsies were obtained.  The patient tolerated the procedure well and  there were no apparent complications.   IMPRESSION:  Heme positive stool, without evident source on today's  examination (792.1).   PLAN:  Consider sigmoidoscopic evaluation in five years for ongoing colon  cancer screening.                                               Bernette Redbird, M.D.    RB/MEDQ  D:  10/21/2003  T:  10/21/2003  Job:  [id number]   cc:   Miguel Aschoff, M.D.  [address], [address]  [place]  [place] [address]  Fax: [phone]   Rosalyn Gess. Norins, M.D. [facility]

## 2010-11-25 NOTE — Assessment & Plan Note (Signed)
Patient is on WelChol. There will be a concern of affordability once her insurance is stopped. We will reassess at that time.

## 2010-11-25 NOTE — Assessment & Plan Note (Signed)
[facility]                           PRIMARY CARE OFFICE NOTE   NAME:[name], [name]                       MRN:          [id number]  DATE:09/24/2006                            DOB:          05-30-1932    [name] is a very pleasant 77 year old woman who presents for  followup evaluation and exam.  She was last seen in the office March 21, 2006 for numbness in her right side and pressure in her occiput and  diagnosed with paraesthesia, it was nonfocal.  She did undergo an MRI  scan of the brain at that time which showed no acute infarct or evidence  of intracranial metastatic disease.  She had mild atrophy and minimal  small vessel disease.   Patient also was hospitalized June 30, 2006 for significant chest  discomfort.  She did undergo cardiac catheterization at that time which  showed no obstructive coronary disease.  Her last cardiology followup  was September 13, 2005 at which time she was stable and doing well.   Patient's interval history is unremarkable from a medical perspective.  She has been very stressed from a social perspective, with her husband  having had abdominal surgery with prolonged ICU stay afterwards, and  ongoing medical problems.  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  She admits to being fatigued and a bit worn out as a caregiver.  She does have occasional signs of anxiety and occasional heaviness in  her chest, which, I believe, is associated with social stresses.   Patient does report that she did have, in May of 2007, some vaginal  spotting.  She was seen by her gynecologist, Dr. Tenny Craw, and underwent  endometrial biopsy, which was evidently unremarkable.   The patient is followed on a regular basis with Dr. Cyndie Chime in  regards to her breast cancer, and I believe her last visit was August of  2007, and she is scheduled to see him in the near future.   PAST MEDICAL HISTORY:  Well documented in the  chart and is significant  for cholecystectomy in 1999, lumpectomy and nodal dissection in 2001.  She has a history of GERD, hypertension, as well as her breast cancer.   FAMILY HISTORY:  Well documented in previous chart notes.   SOCIAL HISTORY:  Her husband is very ill, as noted.  She is his primary  caretaker.  They do live alone and independently.  She has 2 sons who  look in on her on a regular basis and are very supportive, as is her  community.   CURRENT MEDICATIONS:  1. Altace 5 mg daily.  2. Arimidex 1 mg daily.  3. Glucosamine daily.  4. Multivitamin daily.  5. Welchol 625 three tablets b.i.d.  6. Nexium 40 mg daily.  7. Red yeast rice.  8. Omega-3 fatty acids.  9. Ibuprofen 200 mg daily p.r.n.  10.Alprazolam 0.25 mg 1/2 of 1 tablet q.6 p.r.n.   CHART REVIEW:  Last colonoscopy was October 21, 2003.  She would be due  for a followup in 2010 by Dr.  Buccini.  Patient reports that her last  pelvic and Pap smear and endometrial biopsy, as noted, was in May of  2007.  Patient has had MRI of the breast in April of 2006.  I believe  she has had interval studies since that time, and this is directed by  Dr. Cyndie Chime.   PHYSICIAN ROSTER:  1. Cardiology, Dr. Eden Emms.  2. Gynecology, Dr. Tenny Craw.  3. GI, Dr. Matthias Hughs.  4. General surgery, Dr. Jamey Ripa.  5. Oncology, Dr. Katrinka Blazing.  6. Sports medicine, Dr. Earlene Plater.   REVIEW OF SYSTEMS:  Patient has had some fatigue, but generally no  constitutional symptoms.  Last ophthal exam was February of 2008.  No  ENT, cardiovascular, respiratory, GI complaints.  Patient does report  nocturia x2-3.  No musculoskeletal or dermatologic complaints.   PHYSICAL EXAMINATION:  Temperature was 97.1, blood pressure 147/91,  pulse was 77, weight 177.  GENERAL APPEARANCE:  This is a well-nourished, well-developed, mildly  overweight Caucasian woman, looks her stated age, in no acute distress.  HEENT EXAM:  Normocephalic, atraumatic.  EACs and TMs were  unremarkable.  Oropharynx with native dentition in good repair.  No buccal or palate  lesions were noted.  Posterior pharynx was clear.  Conjunctivae and  sclerae were clear.  Pupils are equal, round, and reactive to light and  accommodation.  Funduscopic exam deferred to ophthalmology.  NECK:  Supple without thyromegaly.  NODES:  No adenopathy was noted in the cervical or supraclavicular  regions.  CHEST:  No CVA tenderness.  LUNGS:  Clear to auscultation and percussion.  CARDIOVASCULAR:  2+ radial pulses, no JVD or carotid bruits.  She had a  quiet precordium with a regular rate and rhythm without murmurs, rubs,  or gallops.  BREAST EXAM:  Deferred to Dr. Jamey Ripa and Dr. Cyndie Chime.  ABDOMEN:  Soft, no guarding, no rebound.  No organosplenomegaly was  noted.  PELVIC EXAM AND RECTAL EXAMS:  Deferred to Dr. Tenny Craw.  EXTREMITIES:  Without cyanosis, clubbing, edema or deformity.  NEUROLOGIC EXAM:  Nonfocal.   Patient is sent for routine laboratories today, including AST, ALT,  potassium, creatinine, serum glucose, lipid panel, CBC with diff, TSH.   ASSESSMENT AND PLAN:  1. Gastroesophageal reflux disease.  Patient is stable and well      controlled on Nexium.  2. Lipid.  Patient is on both Welchol and red yeast rice.  A lipid      panel is pending, and she will be advised of the results and any      recommendations for changing her regimen.  3. Hypertension.  Patient's blood pressure is borderline elevated in      the office today on low dose Altace.  Her previous blood pressure,      March 21, 2006 was 104/69.  I have asked the patient to have      her blood pressure checked and monitored if she continues to have      systolics greater than 130, diastolics greater than 90, I would      increase her Altace to 10 mg daily.  4. Musculoskeletal.  Patient is doing well with glucosamine with no      limitations in her activities. 5. Oncology.  Patient is following with Dr. Cyndie Chime  and seems to      be stable and doing well on Arimidex.   SUMMARY:  The patient is a pleasant woman who is carrying quite a burden  in regards to her caretaking responsibilities, but  is doing relatively  well.  She will be notified by phone of her lab results with any  recommendations for change in her regimen.     Rosalyn Gess Norins, MD  Electronically Signed    MEN/MedQ  DD: 09/25/2006  DT: 09/25/2006  Job #: [id number]   cc:   Mrs. Wandy Bossler. Cyndie Chime, M.D.  Miguel Aschoff, M.D.

## 2010-11-25 NOTE — Op Note (Signed)
[place]  Patient:    [name], [name]                       MRN: [id number] Proc. Date: 05/25/00 Adm. Date:  [id number] Disc. Date: [id number] Attending:  Rich Brave CC:         Aliene Altes, M.D.  Alfredia Ferguson, M.D.   Operative Report  CCS [id number].  PREOPERATIVE DIAGNOSIS:  Breast cancer, metastatic to left axilla, primary unlocated.  POSTOPERATIVE DIAGNOSIS:  Breast cancer, metastatic to left axilla, primary unlocated.  PROCEDURE:  Left modified mastectomy.  SURGEON:  Currie Paris, M.D.  ASSISTANT:  Sandria Bales. Ezzard Standing, M.D.  ANESTHESIA:  General endotracheal.  CLINICAL HISTORY:  This patient is a 77 year old lady who has presented with a left axillary mass, which FNA biopsy showed to be metastatic carcinoma. Complete workup has failed to identify a primary, although a PET scan suggested there might be something in the breast.  Core biopsy was then done, which showed the tumor to be ER-positive and morphologically this was thought most likely to represent metastatic breast cancer.  She was therefore counseled and agreed to proceed to left modified mastectomy.  DESCRIPTION OF PROCEDURE:  Patient brought to the operating room and after satisfactory general endotracheal anesthesia obtained, the chest and abdomen was prepped as a single sterile field.  She is willing to undergo a partial TRAM flap today as well for reconstructive purposes.  An elliptical incision was made around the breast and a skin flap raised medially to the sternum, superiorly to the clavicle, laterally into the axilla, and inferiorly to the inframammary fold and laterally then to the latissimus.  This was done with the cautery, and the flaps were kept reasonably thin to stay out of the breast to be sure we had a complete mastectomy.  The breast was then removed from the underlying pectoralis, taking the fascia, and starting medially and working  laterally.  I had the edge of the pectoralis major exposed.  I opened into that, exposing the axillary fat.  I traced up along the edge of the pectoralis until I identified the nerve and vessels coming into the pectoralis and traced those over to the axillary vein.  The axillary contents were removed by sweeping from superior to inferior and medial to lateral, going as far as the vessels to the latissimus.  The long thoracic and thoracodorsal nerve were both identified and preserved.  There were some fairly laterally-placed nodes that were clearly involved with tumor, and these were excised along with the main specimen.  I then stripped the tissues down inferiorly, taking off the anterior edge of the latissimus until we completed the mastectomy.  The wound was irrigated, checked for hemostasis, and everything appeared to be dry.  I used the Bovie and clips primarily for hemostasis.  I checked the nerve function, and both the long thoracic, thoracodorsal, and nerve to the pectoralis all functioned with gentle stimulation.  I put about 5 cc of 0.25% Marcaine around each to help with postoperative analgesia.  The second intercostal nerve was divided, and I also put some local around that.  One more irrigation was done, and one more check finally for hemostasis.  Two 19 Blake drains were placed, one into the axilla and one along the flaps, and skin closed with staples.  The drains were secured with 3-0 nylon.  At this point, Dr. Benna Dunks came in to complete the case and  do the TRAM flap. DD:  05/25/00 TD:  05/26/00 Job: [id number]

## 2010-11-25 NOTE — Op Note (Signed)
[name], [name]                ACCOUNT NO.:  [id number]   MEDICAL RECORD NO.:  [id number]          PATIENT TYPE:  AMB   LOCATION:  DSC                          FACILITY:  [facility]   PHYSICIAN:  Currie Paris, M.D.DATE OF BIRTH:  February 23, 1932   DATE OF PROCEDURE:  07/20/2005  DATE OF DISCHARGE:                                 OPERATIVE REPORT   PREOP DIAGNOSIS:  Subcutaneous mass, left anterior chest wall mastectomy  site.   POSTOPERATIVE DIAGNOSIS:  Subcutaneous mass, left anterior chest wall  mastectomy site. Excision, subcutaneous mass.   SURGEON:  Dr. Jamey Ripa   ANESTHESIA:  Local.   CLINICAL HISTORY:  This patient is about six years out from mastectomy and  lymph node dissection for left breast cancer. Recently two small nodules  were found, the larger of which was about 5 to 7 mm subcutaneous and at the  level of the inframammary fold from her prior mastectomy. We elected to  excise this as a biopsy.   DESCRIPTION OF PROCEDURE:  The patient was seen in the minor procedure room  and mass identified by the patient and myself and marked. The area was then  anesthetized with 1% Xylocaine. It was prepped and draped. A short incision  was made.  A  whitish nodule was excised.  It measured 7 mm.  The incision  was dry and closed in layers with 4-0 Monocryl subcu and subcuticular with  some Dermabond on the skin.   The patient and procedure well. There no complications.      Currie Paris, M.D.  Electronically Signed     CJS/MEDQ  D:  07/20/2005  T:  07/20/2005  Job:  [id number]   cc:   Genene Churn. Cyndie Chime, M.D.  Fax: [phone]

## 2010-11-25 NOTE — Op Note (Signed)
[place]. [facility]  Patient:    [name], [name]                       MRN: [id number] Proc. Date: 07/04/00 Adm. Date:  [id number] Attending:  Charlton Haws                           Operative Report  CCS# [id number]  PREOPERATIVE DIAGNOSIS: 1. Carcinoma left breast. 2. Inadequate venous access.  POSTOPERATIVE DIAGNOSIS: 1. Carcinoma left breast. 2. Inadequate venous access.  OPERATION PERFORMED:  Placement of Port-A-Cath via right subclavian approach.  SURGEON:  Currie Paris, M.D.  ANESTHESIA:  MAC.  INDICATIONS FOR PROCEDURE:  The patient is a 77 year old preparing to begin chemotherapy for carcinoma of the left breast.  Longterm IV access was required for chemo.  DESCRIPTION OF PROCEDURE:  The patient was brought to the operating room and given some IV sedation.  She was placed in Trendelenburg position.  The upper chest and lower neck areas bilaterally were prepped and draped as a sterile field.  1% Xylocaine was used to infiltrate the right infraclavicular fossa and the subclavian vein entered on the initial attempt.  The guide wire was threaded easily and fluoroscopy confirmed it to be in the superior vena cava right atrial area.  Additional local was infiltrated on the right anterior chest wall and a transverse incision made and using cautery, a subcutaneous pocket fashioned for the reservoir.  The tubing for the Port-A-Cath was pulled from the reservoir site into the guide wire site.  The guide wire tract was dilated once with the #10 dilator and then the 10 dilator and peel-away sheath were placed over the guide wire and the dilator and guide wire were removed.  The catheter which had been flushed with dilated heparin was introduced into the superior vena cava and positioned fluoroscopically so that it was near the junction of the superior vena cava right atrium.  It aspirated and irrigated easily.  It was attached to the  reservoir which had been previously flushed.  The reservoir was sutured down with 2-0 Prolene sutures and it aspirated and irrigated easily.  I made sure there were no kinks or sharp curves in the tubing both by visual inspection and then using fluoroscopy.  Everything appeared to be okay.  The wound was checked for hemostasis and appeared to be dry.  The catheter was aspirated and irrigated a final time prior to closing and it worked fine.  The incision was closed with 4-0 Vicryl followed by 4-0 Monocryl subcuticular.  A right angle Huber point needle with IV tubing for postoperative chemo was attached and the catheter flushed a final time with dilute heparin followed by 5 cc of concentrated aqueous heparin and locked.  The dressings were applied and the patient taken to the recovery room.  All counts were correct.  The patient tolerated the procedure well.  There were no operative complications. DD:  07/04/00 TD:  07/04/00 Job: [id number]

## 2010-11-25 NOTE — Op Note (Signed)
NAME:  [name], [name]                          ACCOUNT NO.:  [id number]   MEDICAL RECORD NO.:  [id number]                   PATIENT TYPE:  AMB   LOCATION:  ENDO                                 [facility]ACILITY:  [facility]   PHYSICIAN:  Bernette Redbird, M.D.                DATE OF BIRTH:  12-29-1931   DATE OF PROCEDURE:  10/21/2003  DATE OF DISCHARGE:                                 OPERATIVE REPORT   PROCEDURE:  Upper endoscopy.   INDICATIONS FOR PROCEDURE:  Heme positive stool in a 77 year old female.   FINDINGS:  A 7 cm hiatal hernia.  Small gastric polyp.   DESCRIPTION OF PROCEDURE:  The nature, purpose and risk of the procedure  were familiar to the patient who provided written consent.  Sedation was  fentanyl 62.5 mcg and Versed 5 mg IV without arrhythmias or desaturation.  The Olympus video endoscope was passed under direct vision.  The vocal cords  were not well seen.  The esophageal mucosa was normal despite a history of  reflux.  Specifically, there was no evidence of reflux esophagitis,  Barrett's esophagus, varices, infection or neoplasia. There was a widely  patent esophageal ring at the squamocolumnar junction, located at about 33  cm from the mouth.  Below this was a 7 cm rather capacious hiatal hernia  with a diaphragmatic hiatus at 40 cm.   In the stomach was a 4-5 mm gastric polyp along the greater curve biopsied a  couple of times.  No large polyps were observed in the stomach nor did I see  any masses, gastritis, erosions, ulcers.  The pylorus duodenal bulb and the  second duodenum looked normal.  Retroflexed viewing showed a very patulous  diaphragmatic hiatus measuring about 7 cm across.   The scope was removed from the patient who tolerated the procedure well and  without apparent complications.   IMPRESSION:  1. Gastric polyp (211.1).  2. Large hiatal hernia with a patulous diaphragmatic hiatus.  3. No source of heme positive stool identified.   PLAN:  Proceed with  colonoscopic evaluation.                                               Bernette Redbird, M.D.    RB/MEDQ  D:  10/21/2003  T:  10/21/2003  Job:  [id number]   cc:   Miguel Aschoff, M.D.  [address], [address]  [place]  [place] [address]  Fax: [phone]   Rosalyn Gess. Norins, M.D. [facility]

## 2010-11-25 NOTE — Procedures (Signed)
[facility]  Patient:    [name], [name]                         MRN: [id number] Proc. Date: 04/26/00 Attending:  Florencia Reasons, M.D. CC:         Rosalyn Gess. Norins, M.D. [facility]             Currie Paris, M.D.             Dr. Aliene Altes                           Procedure Report  PROCEDURE:  Upper endoscopy with biopsies.  SURGEON:  Florencia Reasons, M.D.  INDICATIONS:  A 77 year old female who recently has been found to have adenocarcinoma in an axillary lymph node.  FINDINGS:  Gastric polyp, biopsied.  Large hiatal hernia.  DESCRIPTION OF PROCEDURE:  The nature, purpose, and risks of the procedure were familiar to the patient from prior examination.  She provided written consent.  Sedation was fentanyl 75 mcg and Versed 8 mg IV without arrhythmias or desaturation.  The Olympus video endoscope was passed under direct vision. Careful examination of the vocal cords and larynx did not show any obvious evidence of tumor.  The esophagus was easily entered and was normal in its entirety without evidence of reflux esophagitis, Barretts esophagus, varices, infection, or neoplasia.  No ring or stricture was present, but there was a 4 cm hiatal hernia.  There was a ring-like narrowing of the esophagus right at the squamocolumnar junction without a discrete ring being present.  The stomach was entered.  It contained no blood or coffee-ground material. The main finding on this exam was a 1 cm erythematous polyp with some overlying exudate looking like an inflammatory polyp, semi-pedunculated, on the greater curve in the mid body of the stomach.  Multiple biopsies were obtained from it, even though it did not look like a frank malignancy.  The remainder of the stomach was normal in appearance without evidence of gastritis, erosions, ulcers, or any other polyps or masses other than the one just mentioned.  The pylorus, duodenal bulb, and second duodenum  similarly looked normal.  The patient tolerated the procedure well and there were no apparent complications.  IMPRESSION:  Gastric polyp of uncertain clinical significance, pathology pending.  Medium large hiatal hernia.  PLAN:  Await pathology on polyp.  Proceed to colonoscopic evaluation. DD:  04/26/00 TD:  04/26/00 Job: [id number]

## 2010-11-25 NOTE — Procedures (Signed)
[facility]  Patient:    [name], [name]                         MRN: [id number] Proc. Date: 04/26/00 Attending:  Florencia Reasons, M.D. CC:         Rosalyn Gess. Norins, M.D. [facility]             Currie Paris, M.D.             Dr. Aliene Altes                           Procedure Report  PROCEDURE:  Colonoscopy.  SURGEON:  Florencia Reasons, M.D.  INDICATIONS:  A 77 year old female with adenocarcinoma turning up in an axillary lymph node.  The primary is unknown since breasts evaluation has been negative to date.  FINDINGS:  Normal exam to the terminal ileum.  DESCRIPTION OF PROCEDURE:  The nature, purpose, and risks ofthe procedure were familiar to the patient from prior examination, and she provided written consent.  Sedation for this procedure and the upper endoscopy which preceded it totalled fentanyl 100 mcg and Versed 11 mg IV without arrhythmias or desaturation.  The Olympus adult video colonoscopewas advanced without difficulty to the terminal ileum which was entered for a moderate distance, after which pull back was initiated.  The quality of the prep was excellent, and it was felt that all areas were well-seen.  This was a normal examination.  No polyps, cancer, colitis, vascular malformations, or diverticular disease were observed, and retroflexionof the rectum was normal.  There were some internal hemorrhoids.  No biopsies were obtained.  The patient tolerated the procedure well and there were no apparent complications.  IMPRESSION:  Normal colonoscopy in a patient with a metastatic adenocarcinoma of unknown primary.  PLAN:  We are still awaiting the results of the pathology on the gastric polyp which was biopsied today, note that the patient had a negative endoscopy by me 2-1/2 years ago, so the observed polyp is a new finding. DD:  04/26/00 TD:  04/26/00 Job: [id number]

## 2010-11-27 ENCOUNTER — Encounter: Payer: Self-pay | Admitting: *Deleted

## 2010-11-30 ENCOUNTER — Ambulatory Visit (INDEPENDENT_AMBULATORY_CARE_PROVIDER_SITE_OTHER): Payer: Medicare Other

## 2010-11-30 DIAGNOSIS — I498 Other specified cardiac arrhythmias: Secondary | ICD-10-CM

## 2010-11-30 DIAGNOSIS — I471 Supraventricular tachycardia: Secondary | ICD-10-CM

## 2010-11-30 NOTE — Progress Notes (Signed)
S: Nurse visit for rhythm strip B: office visit on 11/25/2010, stopped coreg and digoxin due to pt's c/o dizziness and fullness in her head A: much less feelings of this now, rhythm strip performed and in your box for review, no other c/o R:

## 2010-12-06 NOTE — Progress Notes (Signed)
12/06/2010  Noted.  Wynter Grave, M.D. 

## 2010-12-19 ENCOUNTER — Other Ambulatory Visit: Payer: Self-pay | Admitting: Obstetrics and Gynecology

## 2010-12-19 DIAGNOSIS — Z1231 Encounter for screening mammogram for malignant neoplasm of breast: Secondary | ICD-10-CM

## 2010-12-19 DIAGNOSIS — Z9012 Acquired absence of left breast and nipple: Secondary | ICD-10-CM

## 2010-12-28 ENCOUNTER — Ambulatory Visit
Admission: RE | Admit: 2010-12-28 | Discharge: 2010-12-28 | Disposition: A | Discharge status: Home / Self Care | Payer: Medicare Other | Source: Ambulatory Visit | Attending: Obstetrics and Gynecology | Admitting: Obstetrics and Gynecology

## 2010-12-28 DIAGNOSIS — Z1231 Encounter for screening mammogram for malignant neoplasm of breast: Secondary | ICD-10-CM

## 2010-12-28 DIAGNOSIS — Z9012 Acquired absence of left breast and nipple: Secondary | ICD-10-CM

## 2011-01-17 ENCOUNTER — Ambulatory Visit (INDEPENDENT_AMBULATORY_CARE_PROVIDER_SITE_OTHER): Payer: Medicare Other | Admitting: Internal Medicine

## 2011-01-17 ENCOUNTER — Telehealth: Payer: Self-pay | Admitting: Cardiology

## 2011-01-17 DIAGNOSIS — K625 Hemorrhage of anus and rectum: Secondary | ICD-10-CM

## 2011-01-17 DIAGNOSIS — Z7901 Long term (current) use of anticoagulants: Secondary | ICD-10-CM

## 2011-01-17 NOTE — Telephone Encounter (Signed)
Pt is a mutual pt of Dr. Dietrich Pates & Dr. Marcy Salvo. Dr Marcy Salvo office is wanting to know if is okay to stop Pradaxa to have a Colonoscopy to r/o Rectal bleeding.Ann-RN would like a return back call from nurse.

## 2011-01-18 NOTE — Consult Note (Unsigned)
NAME:  [name], [name]                     ACCOUNT NO.:  MEDICAL RECORD NO.:  [id number]  LOCATION:                                 FACILITY:  PHYSICIAN:  Lionel December, M.D.    DATE OF BIRTH:  10/06/1931  DATE OF CONSULTATION:  01/17/2011 DATE OF DISCHARGE:                                CONSULTATION   REASON FOR CONSULTATION:  Rectal bleeding.  HISTORY OF PRESENT ILLNESS:  [name] is a 77 year old female presenting today with complaints that she has had some rectal bleeding. This has been occurring for over a year.  She notices the blood when she wipes.  She also complains of irritation to her right buttock.  She said the irritation is worse when she is bending over out in the yard.  She also tells me she has stool leakage at times after she has a BM.  She will wipe after her BM, and then she will have stool leakage into her panties.  She has been using Desitin ointment on her irritation to her rectum.  She denies any abdominal pain.  Her bowel movements are brown in color.  She has not seen any blood in her stools.  There had been no melena.  She usually has a bowel movement once a day and sometimes 2-3. She has had no unintentional weight loss.  She does have occasional acid reflux.  [place] does have a history of left breast carcinoma and underwent a modified radical mastectomy in 2001.  She did have several positive lymph nodes.  Her last colonoscopy appears was in 2005 by Dr. Matthias Hughs.  She underwent a colonoscopy for heme-positive stools.  The colonoscopy revealed mild muscular thickening and probable minimal diverticular changes in the sigmoid colon.  No polyps, cancer, colitis, or vascular malformations were observed.  Retroflexion in the rectum showed moderate internal hemorrhoids.  In 2005, she also underwent an EGD for heme-positive stools, which revealed a gastric polyp, large hiatal hernia with a patulous diaphragmatic hiatus.  No source of heme- positive stools were  identified.  HOME MEDICATIONS: 1. Xanax 0.25 mg at bedtime as needed. 2. Vitamin D 1000 units daily. 3. WelChol 1875 mg 2 times daily with a meal. 4. Pradaxa 150 mg 1 q.12 hours. 5. Nexium 40 mg daily. 6. Glucosamine 1500 complex 1 a day. 7. Nitroglycerin 0.4 mg sublingually as needed. 8. Verapamil 180 mg at bedtime.  ALLERGIES:  There are no known allergies.  SURGERIES:  She had a left modified radical mastectomy in 2001 for carcinoma.  She has had a cholecystectomy and she has had several breast lumpectomies for benign masses.  MEDICAL HISTORY:  Includes hypertension and atrial fibrillation.  FAMILY HISTORY:  Her mother deceased from CAD.  Father deceased from multiple myeloma.  Two sisters are both deceased, one from kidney failure, one had lung cancer and heart attack.  SOCIAL HISTORY:  She is widowed recently.  She is retired.  She does not smoke, drink, or do drugs, and she has 2 children in good health.  OBJECTIVE:  VITAL SIGNS:  Her height is 5 feet 1-1/2 inches, weight 174, blood pressure 94/66, pulse is 96  and irregular. HEENT:  She has natural teeth.  Her oral mucosa is moist.  Her conjunctivae are pink.  Her sclerae are anicteric. NECK:  Her thyroid is normal.  There is no cervical lymphadenopathy. LUNGS:  Clear. HEART:  Regular rate and rhythm. ABDOMEN:  Slightly obese.  Bowel sounds are positive.  No masses.  No tenderness noted.  There is no edema to his extremities.  Her stool was brown and guaiac negative.  She does have a reddened area to her rectum. There is no bleeding from that area.  ASSESSMENT:  Corene is a 77 year old female presenting today with complaints of rectal bleeding.  She was seen negative today in the office, however, she has noticed blood on the toilet tissue in the past. Rectal carcinoma, polyp, AVM need to be ruled out.  Hemorrhoids were also in the differential.  It is worse when she has had a history of breast carcinoma.  I did  discuss this case with Dr. Karilyn Cota.  We will schedule a colonoscopy in the near future.  The risk and benefits were reviewed with her and she is agreeable.  I also prescribed ProctoCream, she may apply twice a day to that irritated area to her rectum.  She may call with a progress report in 2 weeks.    ______________________________ Dorene Ar, NP   ______________________________ Lionel December, M.D.    TS/MEDQ  D:  01/17/2011  T:  01/18/2011  Job:  [id number]

## 2011-01-18 NOTE — Telephone Encounter (Signed)
Last office visit on 11/25/10- stopped coreg and digoxin Dizziness abated and pt needs to stop pradaxa for colonoscopy   Being done to r/o rectal bleeding?

## 2011-01-23 NOTE — Telephone Encounter (Signed)
Pradaxa can be stopped 48 hours prior to colonoscopy and resumed immediately after procedure if no polypectomy performed or 48 hours after a polypectomy.

## 2011-01-25 ENCOUNTER — Telehealth: Payer: Self-pay | Admitting: Cardiology

## 2011-01-25 NOTE — Telephone Encounter (Signed)
S: pt is to have a colonoscopy B: last office visit on 11/25/10, stopped digoxin and coreg A:  Needs to stop pradaxa for  Procedure, scheduled for 02/22/11 R:

## 2011-01-27 NOTE — Telephone Encounter (Signed)
Please discuss anticoagulation issues with Weston Brass.  If [facility] has a protocol, Misty Stanley should be able to administer it.

## 2011-01-29 NOTE — Telephone Encounter (Signed)
Left email for Kennon Rounds

## 2011-02-06 ENCOUNTER — Other Ambulatory Visit: Payer: Self-pay | Admitting: Internal Medicine

## 2011-02-08 ENCOUNTER — Encounter (INDEPENDENT_AMBULATORY_CARE_PROVIDER_SITE_OTHER): Payer: Self-pay

## 2011-02-13 ENCOUNTER — Encounter: Payer: Self-pay | Admitting: *Deleted

## 2011-02-13 ENCOUNTER — Encounter (INDEPENDENT_AMBULATORY_CARE_PROVIDER_SITE_OTHER): Payer: Self-pay | Admitting: *Deleted

## 2011-02-14 NOTE — Telephone Encounter (Signed)
This encounter was created in error - please disregard.

## 2011-02-21 MED ORDER — SODIUM CHLORIDE 0.45 % IV SOLN
Freq: Once | INTRAVENOUS | Status: AC
  Administered 2011-02-22: 1000 mL via INTRAVENOUS

## 2011-02-22 ENCOUNTER — Ambulatory Visit (HOSPITAL_COMMUNITY)
Admission: RE | Admit: 2011-02-22 | Discharge: 2011-02-22 | Disposition: A | Discharge status: Home / Self Care | Payer: Medicare Other | Source: Ambulatory Visit | Attending: Internal Medicine | Admitting: Internal Medicine

## 2011-02-22 ENCOUNTER — Encounter (HOSPITAL_COMMUNITY): Payer: Self-pay | Admitting: *Deleted

## 2011-02-22 ENCOUNTER — Encounter (HOSPITAL_COMMUNITY)
Admission: RE | Disposition: A | Discharge status: Home / Self Care | Payer: Self-pay | Source: Ambulatory Visit | Attending: Internal Medicine

## 2011-02-22 ENCOUNTER — Encounter (INDEPENDENT_AMBULATORY_CARE_PROVIDER_SITE_OTHER): Payer: Medicare Other | Admitting: Internal Medicine

## 2011-02-22 DIAGNOSIS — I10 Essential (primary) hypertension: Secondary | ICD-10-CM | POA: Insufficient documentation

## 2011-02-22 DIAGNOSIS — R1319 Other dysphagia: Secondary | ICD-10-CM | POA: Insufficient documentation

## 2011-02-22 DIAGNOSIS — K5730 Diverticulosis of large intestine without perforation or abscess without bleeding: Secondary | ICD-10-CM | POA: Insufficient documentation

## 2011-02-22 DIAGNOSIS — K208 Other esophagitis without bleeding: Secondary | ICD-10-CM

## 2011-02-22 DIAGNOSIS — K219 Gastro-esophageal reflux disease without esophagitis: Secondary | ICD-10-CM

## 2011-02-22 DIAGNOSIS — K921 Melena: Secondary | ICD-10-CM | POA: Insufficient documentation

## 2011-02-22 DIAGNOSIS — R1310 Dysphagia, unspecified: Secondary | ICD-10-CM

## 2011-02-22 DIAGNOSIS — Z79899 Other long term (current) drug therapy: Secondary | ICD-10-CM | POA: Insufficient documentation

## 2011-02-22 DIAGNOSIS — K644 Residual hemorrhoidal skin tags: Secondary | ICD-10-CM

## 2011-02-22 DIAGNOSIS — E785 Hyperlipidemia, unspecified: Secondary | ICD-10-CM | POA: Insufficient documentation

## 2011-02-22 HISTORY — PX: ESOPHAGOGASTRODUODENOSCOPY: SHX5428

## 2011-02-22 HISTORY — PX: COLONOSCOPY: SHX5424

## 2011-02-22 SURGERY — COLONOSCOPY
Anesthesia: Moderate Sedation

## 2011-02-22 MED ORDER — BUTAMBEN-TETRACAINE-BENZOCAINE 2-2-14 % EX AERO
INHALATION_SPRAY | CUTANEOUS | Status: DC | PRN
  Administered 2011-02-22: 1 via TOPICAL

## 2011-02-22 MED ORDER — MIDAZOLAM HCL 5 MG/5ML IJ SOLN
INTRAMUSCULAR | Status: DC | PRN
  Administered 2011-02-22 (×2): 2 mg via INTRAVENOUS

## 2011-02-22 MED ORDER — MEPERIDINE HCL 25 MG/ML IJ SOLN
INTRAMUSCULAR | Status: DC | PRN
  Administered 2011-02-22: 25 mg via INTRAVENOUS

## 2011-02-22 MED ORDER — MEPERIDINE HCL 50 MG/ML IJ SOLN
INTRAMUSCULAR | Status: AC
  Filled 2011-02-22: qty 1

## 2011-02-22 MED ORDER — MIDAZOLAM HCL 5 MG/5ML IJ SOLN
INTRAMUSCULAR | Status: AC
  Filled 2011-02-22: qty 5

## 2011-02-22 NOTE — H&P (Signed)
[name] is an 77 y.o. female.   Chief Complaint: For EGD,ED and colonoscopy HPI: Patient is 77 year old female presents with intermittent solid food dysphagia mannerly experienced a chicken. Denies frequent heartburn nausea vomiting abdominal pain or melena. Also give history of intermittent hematochezia resume to be secondary to hemorrhoids. He is undergoing colonoscopy to be sure that she does not other lesions. Loss colonoscopy was 7 years ago. Family history is negative for colorectal carcinoma.  Past Medical History  Diagnosis Date  . Herpes zoster   . Palpitations     RECURRENT (IC  . Coronary artery disease   . Hyperlipidemia   . Polio     * Hx of POLIO AT AGE 76  . Peripheral neuropathy   . S/P carpal tunnel release   . Hiatal hernia   . Anxiety   . Hypertension   . GERD (gastroesophageal reflux disease)   . Carcinoma of breast   . Coronary artery disease      cath Feb '07- no obstructive dz - 20% proximal LAD only; EF 65%  . Rectal bleed     Past Surgical History  Procedure Date  . Mastectomy      RADICAL, WITH AXILLARY LYMPH NODES, HX OF LEFT (ICD-V45.71)  . Cholecystectomy      HX OF    History reviewed. No pertinent family history. Social History:  reports that she has never smoked. She has never used smokeless tobacco. She reports that she does not drink alcohol or use illicit drugs.  Allergies:  Allergies  Allergen Reactions  . Hydrocodone     Medications Prior to Admission  Medication Dose Route Frequency Provider Last Rate Last Dose  . 0.45 % sodium chloride infusion   Intravenous Once Malissa Hippo, MD 20 mL/hr at 02/22/11 1419 1,000 mL at 02/22/11 1419  . meperidine (DEMEROL) 50 MG/ML injection           . midazolam (VERSED) 5 MG/5ML injection            Medications Prior to Admission  Medication Sig Dispense Refill  . ALPRAZolam (XANAX) 0.25 MG tablet Take 0.25 mg by mouth at bedtime as needed.       . Cholecalciferol (VITAMIN D) 1000  UNITS capsule Take 1,000 Units by mouth daily.        . dabigatran (PRADAXA) 150 MG CAPS Take 150 mg by mouth every 12 (twelve) hours.        . nitroGLYCERIN (NITROSTAT) 0.4 MG SL tablet Place 0.4 mg under the tongue every 5 (five) minutes as needed.        . verapamil (COVERA HS) 180 MG (CO) 24 hr tablet Take 180 mg by mouth at bedtime.        Marland Kitchen esomeprazole (NEXIUM) 40 MG capsule Take 40 mg by mouth daily before breakfast.        . Glucosamine-Chondroit-Vit C-Mn (GLUCOSAMINE 1500 COMPLEX) CAPS Take 1 capsule by mouth daily.          No results found for this or any previous visit (from the past 48 hour(s)). No results found.  Review of Systems  Constitutional: Negative for weight loss.  Gastrointestinal: Positive for blood in stool. Negative for heartburn, nausea, vomiting, abdominal pain, diarrhea, constipation and melena.    Blood pressure 135/87, pulse 121, temperature 97.6 F (36.4 C), temperature source Oral, resp. rate 18, height 5\' 1"  (1.549 m), weight 165 lb (74.844 kg), SpO2 93.00%. Physical Exam  Constitutional: She is oriented to person,  place, and time. She appears well-developed and well-nourished.  HENT:  Mouth/Throat: Oropharynx is clear and moist.  Eyes: Conjunctivae are normal. No scleral icterus.  Neck: No thyromegaly present.  Cardiovascular: Normal heart sounds.   No murmur heard.      irregular rhythm  Respiratory: Breath sounds normal.  GI: Soft. She exhibits no distension and no mass. There is no tenderness.  Musculoskeletal: She exhibits no edema.  Lymphadenopathy:    She has no cervical adenopathy.  Neurological: She is oriented to person, place, and time.  Skin: Skin is warm and dry.     Assessment/Plan Dysphagia. Hematochezia. EGD poss. ED  And Colonoscopy.  REHMAN,NAJEEB U 02/22/2011, 3:13 PM

## 2011-02-22 NOTE — Op Note (Addendum)
PROCEDURE REPORT EGD and COLONOSCOPY  PATIENT:  [name]  MR#:  [id number] Birthdate:  07-03-1932, 77 y.o., female Endoscopist:  Dr. Malissa Hippo, MD Referred By:  Dr. Jacques Navy MD Procedure Date: 02/22/2011  Procedure:   EGD & Colonoscopy  Indications: Intermittent solid food dysphagia in a patient with chronic GERD and well controlled heartburn with therapy. Recurrent hematochezia.            Informed Consent: Both the procedures reviewed with the patient along with the risks and informed consent was obtained. Medications:  Demerol 25 mg IV Versed 4 mg IV Cetacaine spray topically for oropharyngeal anesthesia  EGD  Description of procedure:  The endoscope was introduced through the mouth and advanced to the second portion of the duodenum without difficulty or limitations. The mucosal surfaces were surveyed very carefully during advancement of the scope and upon withdrawal.  Findings:  Esophagus: Mucosa of the esophagus was normal somewhat tortuous body. Focal erythema at GEJ but no ring or stricture noted. This segment was dilated with a balloon to 18 mm but no mucosal disruption induced. Moderate ascites hiatal hernia with focal erythema at the level of hiatus. GEJ:  31 cm Hiatus:  38 cm Stomach:  Normal examination of body, antrum, pyloric channel as well as fundus and cardia Duodenum:  Normal bulbar and postbulbar mucosa.  Therapeutic/Diagnostic Maneuvers Performed:  GE junction dilated with a balloon from 15-18 mm.  COLONOSCOPY Description of procedure:  After a digital rectal exam was performed, that colonoscope was advanced from the anus through the rectum and colon to the area of the cecum, ileocecal valve and appendiceal orifice. The cecum was deeply intubated. These structures were well-seen and photographed for the record. From the level of the cecum and ileocecal valve, the scope was slowly and cautiously withdrawn. The mucosal surfaces were carefully  surveyed utilizing scope tip to flexion to facilitate fold flattening as needed. The scope was pulled down into the rectum where a thorough exam including retroflexion was performed.  Findings:   Prep excellent. Normal terminal ileum Few small diverticula at sigmoid colon. Moderate size external hemorrhoids  Therapeutic/Diagnostic Maneuvers Performed:  None  Complications:  None  Cecal Withdrawal Time:  9 minutes  Impression:  1.Mild changes of esophagitis at GE junction without obvious    stricture or ring. This segment dilated with a balloon to 18    mm but no mucosal disruption noted. Suspect she may    have esophageal motility disorder.    Normal examination of the stomach, first and the second    part of the duodenum. 2.Normal terminal ileum.    Sigmoid diverticulosis.     External hemorrhoids felt to be source of her     hematochezia.   Recommendations:  Continue anti-reflex measures and Nexium as before. Resume Pradaxa  and other medications as before. High fiber diet. Mycolog-II cream to anal canal twice daily for 2 weeks and thereafter on an as-needed basis. Call office visit progress report next week regarding dysphagia.   Kallie Depolo U  02/22/2011 3:52 PM  CC: Dr.   Rica Mote Call office with over a support next week regarding dysphagia There is typing error in the last line under recommendations.

## 2011-03-01 ENCOUNTER — Encounter (HOSPITAL_COMMUNITY): Payer: Self-pay | Admitting: Internal Medicine

## 2011-03-01 ENCOUNTER — Ambulatory Visit (INDEPENDENT_AMBULATORY_CARE_PROVIDER_SITE_OTHER): Payer: Medicare Other | Admitting: Internal Medicine

## 2011-03-01 VITALS — BP 118/70 | HR 74 | Temp 98.4°F | Ht 61.0 in | Wt 174.0 lb

## 2011-03-01 DIAGNOSIS — K921 Melena: Secondary | ICD-10-CM

## 2011-03-01 DIAGNOSIS — K219 Gastro-esophageal reflux disease without esophagitis: Secondary | ICD-10-CM

## 2011-03-01 MED ORDER — OMEPRAZOLE 40 MG PO CPDR: 40.0000 mg | DELAYED_RELEASE_CAPSULE | Freq: Every day | ORAL | Status: DC

## 2011-03-01 NOTE — Progress Notes (Signed)
Presenting complaint; followup for GERD dysphagia and hematochezia. Subjective; patient had EGD EGD and colonoscopy last week. She was supposed to call us with a progress report but instead decided to come for a visit. Her dysphagia has resolved since her esophagus was dilated. Nexium is working her co-pay is $30 a month and she would like to try a cheaper alternative. She has tried pantoprazole but it did not work. She believes she is also taken Prevacid in the past and may have developed diarrhea. She is still having scant amount blood with a bowel movement. He also has a fecal seepage causing her to go back to the bathroom the second after initial BM. Current Outpatient Prescriptions on File Prior to Visit  Medication Sig Dispense Refill  . ALPRAZolam (XANAX) 0.25 MG tablet Take 0.25 mg by mouth. Takes 1/2 tablet as needed      . Cholecalciferol (VITAMIN D) 1000 UNITS capsule Take 1,000 Units by mouth as needed.       . dabigatran (PRADAXA) 150 MG CAPS Take 150 mg by mouth every 12 (twelve) hours.        . Glucosamine-Chondroit-Vit C-Mn (GLUCOSAMINE 1500 COMPLEX) CAPS Take 1 capsule by mouth daily.        . nitroGLYCERIN (NITROSTAT) 0.4 MG SL tablet Place 0.4 mg under the tongue every 5 (five) minutes as needed.        . verapamil (COVERA HS) 180 MG (CO) 24 hr tablet Take 180 mg by mouth every morning.       . WELCHOL 625 MG tablet TAKE 3 TABLETS BY MOUTH TWO TIMES A DAY  540 tablet  3   objective. BP 118/70  Pulse 74  Temp(Src) 98.4 F (36.9 C) (Oral)  Ht 5\' 1"  (1.549 m)  Wt 174 lb (78.926 kg)  BMI 32.88 kg/m2  Assessment; 1. Chronic GERD. She has moderate size hiatal hernia noted on recent EGD. She needs to be on chronic PPI therapy; her symptoms relapse in less than 24 hours off therapy. Will try on omeprazole before considering other generic PPIs. 2. Dysphagia. Has completely resolved post dilation. I am concerned that she may have a motility disorder. As long as she is asymptomatic  would not pursue any further. 3. Hematochezia secondary to hemorrhoids and local irritation possibly due to fecal seepage. Adding fiber supplement might help. Patient reassured she does not have proctitis or neoplasm. Recommendations; Discontinue Nexium. Start omeprazole 40 mg by mouth every morning 6 day supply given along with prescription for one month with 11 refills. If it doesn't work she will give [place] a call. Fiber supplement at 4 g daily at bedtime. With continued topical therapy with Mycolog-II cream as recommended. She will call for hematochezia persists otherwise return for office visit in one year

## 2011-03-01 NOTE — Patient Instructions (Signed)
Stop Nexium. Start Omeprazole 40 mg by mouth 30 minutes daily before breakfast. Fiber supplement 4 grams by mouth daily at bedtime.

## 2011-03-07 ENCOUNTER — Ambulatory Visit (INDEPENDENT_AMBULATORY_CARE_PROVIDER_SITE_OTHER): Payer: Medicare Other | Admitting: Internal Medicine

## 2011-03-20 ENCOUNTER — Encounter: Payer: Self-pay | Admitting: Cardiology

## 2011-03-22 ENCOUNTER — Ambulatory Visit (HOSPITAL_COMMUNITY)
Admission: RE | Admit: 2011-03-22 | Discharge: 2011-03-22 | Disposition: A | Discharge status: Home / Self Care | Payer: Medicare Other | Source: Ambulatory Visit | Attending: Cardiology | Admitting: Cardiology

## 2011-03-22 ENCOUNTER — Encounter: Payer: Self-pay | Admitting: Cardiology

## 2011-03-22 ENCOUNTER — Ambulatory Visit (INDEPENDENT_AMBULATORY_CARE_PROVIDER_SITE_OTHER): Payer: Medicare Other | Admitting: Cardiology

## 2011-03-22 ENCOUNTER — Ambulatory Visit (HOSPITAL_COMMUNITY): Payer: Medicare Other

## 2011-03-22 DIAGNOSIS — I10 Essential (primary) hypertension: Secondary | ICD-10-CM | POA: Insufficient documentation

## 2011-03-22 DIAGNOSIS — K219 Gastro-esophageal reflux disease without esophagitis: Secondary | ICD-10-CM | POA: Insufficient documentation

## 2011-03-22 DIAGNOSIS — R05 Cough: Secondary | ICD-10-CM | POA: Insufficient documentation

## 2011-03-22 DIAGNOSIS — E785 Hyperlipidemia, unspecified: Secondary | ICD-10-CM

## 2011-03-22 DIAGNOSIS — I4891 Unspecified atrial fibrillation: Secondary | ICD-10-CM | POA: Insufficient documentation

## 2011-03-22 DIAGNOSIS — Z01818 Encounter for other preprocedural examination: Secondary | ICD-10-CM | POA: Insufficient documentation

## 2011-03-22 DIAGNOSIS — R0602 Shortness of breath: Secondary | ICD-10-CM | POA: Insufficient documentation

## 2011-03-22 DIAGNOSIS — A809 Acute poliomyelitis, unspecified: Secondary | ICD-10-CM

## 2011-03-22 DIAGNOSIS — R059 Cough, unspecified: Secondary | ICD-10-CM | POA: Insufficient documentation

## 2011-03-22 DIAGNOSIS — Z853 Personal history of malignant neoplasm of breast: Secondary | ICD-10-CM | POA: Insufficient documentation

## 2011-03-22 DIAGNOSIS — R079 Chest pain, unspecified: Secondary | ICD-10-CM | POA: Insufficient documentation

## 2011-03-22 DIAGNOSIS — I471 Supraventricular tachycardia: Secondary | ICD-10-CM

## 2011-03-22 DIAGNOSIS — C50919 Malignant neoplasm of unspecified site of unspecified female breast: Secondary | ICD-10-CM

## 2011-03-22 DIAGNOSIS — I2510 Atherosclerotic heart disease of native coronary artery without angina pectoris: Secondary | ICD-10-CM

## 2011-03-22 DIAGNOSIS — F411 Generalized anxiety disorder: Secondary | ICD-10-CM

## 2011-03-22 MED ORDER — FLECAINIDE ACETATE 100 MG PO TABS: 100.0000 mg | ORAL_TABLET | Freq: Two times a day (BID) | ORAL | Status: DC

## 2011-03-22 NOTE — Progress Notes (Signed)
HPI : [name] returns for continued assessment and treatment of atrial arrhythmias and cardiovascular risk factors with a history of minimal coronary disease at catheterization 5 years ago.  In recent months, she is noted increased dyspnea with exertion, decreased exercise tolerance and a fullness in her neck with a sense that blood is rushing towards her head.  Current Outpatient Prescriptions on File Prior to Visit  Medication Sig Dispense Refill  . ALPRAZolam (XANAX) 0.25 MG tablet Take 0.25 mg by mouth. Takes 1/2 tablet as needed      . Cholecalciferol (VITAMIN D) 1000 UNITS capsule Take 1,000 Units by mouth as needed.       . dabigatran (PRADAXA) 150 MG CAPS Take 150 mg by mouth every 12 (twelve) hours.        . Glucosamine-Chondroit-Vit C-Mn (GLUCOSAMINE 1500 COMPLEX) CAPS Take 1 capsule by mouth daily.        . Multiple Vitamins-Calcium (ONE-A-DAY WOMENS PO) Take by mouth daily.        . nitroGLYCERIN (NITROSTAT) 0.4 MG SL tablet Place 0.4 mg under the tongue every 5 (five) minutes as needed.        Marland Kitchen omeprazole (PRILOSEC) 40 MG capsule Take 1 capsule (40 mg total) by mouth daily.  30 capsule  11  . PROCTOSOL HC 2.5 % rectal cream Place rectally daily.       . verapamil (COVERA HS) 180 MG (CO) 24 hr tablet Take 180 mg by mouth every morning.       . WELCHOL 625 MG tablet TAKE 3 TABLETS BY MOUTH TWO TIMES A DAY  540 tablet  3     Allergies  Allergen Reactions  . Hydrocodone Other (See Comments)    GI distress.      Past medical history, social history, and family history reviewed and updated.  ROS: Denies orthopnea, PND, pedal edema, lightheadedness or syncope.  PHYSICAL EXAM: BP 131/85  Pulse 82  Resp 18  Ht 5\' 3"  (1.6 m)  Wt 177 lb (80.287 kg)  BMI 31.35 kg/m2  SpO2 100%  General-Well developed; no acute distress Body habitus-overweight Neck-No JVD; no carotid bruits Lungs-clear lung fields; resonant to percussion Cardiovascular-normal PMI; normal S1 and S2; modest  systolic murmur at the left sternal border Abdomen-normal bowel sounds; soft and non-tender without masses or organomegaly Musculoskeletal-No deformities, no cyanosis or clubbing Neurologic-Normal cranial nerves; symmetric strength and tone Skin-Warm, no significant lesions Extremities-distal pulses intact; no edema  EKG:  Atrial fibrillation with a controlled ventricular response; ventricular rate is 90 bpm; right superior axis; low voltage; possible prior anteroseptal MI.  Comparison with prior tracing of 01/13/09, atrial fibrillation has replaced sinus rhythm; axis has shifted into the right superior quadrant; poor R wave progression now more apparent.  ASSESSMENT AND PLAN:

## 2011-03-22 NOTE — Assessment & Plan Note (Addendum)
Kristeen has previously been intolerant of atorvastatin as a result of muscle discomfort in her legs.  She may be able to tolerate a different statin, which would be a desirable component of treatment of hyperlipidemia.  We will repeat a lipid profile and then make a determination regarding modification of her lipid lowering regime.

## 2011-03-22 NOTE — Assessment & Plan Note (Signed)
Presence of atrial fibrillation probably indicates that previous supraventricular tachycardia was atrial flutter with 2-1 AV block.  She appears to be symptomatic with atrial fibrillation, but some of the symptoms predated her previous visit at which time she apparently did not have AF.  Nonetheless, we will treat her arrhythmia before approaching other issues.  Basic laboratory studies including a TSH will be obtained.  She had minimal coronary disease and no LVH on previous cardiac testing.  Flecainide100 mg twice a day will be started in hopes of converting her to sinus rhythm.  If not, DC cardioversion can be undertaken.  I will reassess this nice woman in one week.

## 2011-03-22 NOTE — Assessment & Plan Note (Signed)
Blood pressure control is good.  Verapamil is also contributing to control of ventricular rate in atrial fibrillation.  Current medications will be continued.

## 2011-03-22 NOTE — Progress Notes (Deleted)
HPI: This is a 77 year old white female patient who is here today for cardiac clearance before undergoing lumbar disc surgery at [facility] in November. She has a long history of coronary artery disease status post PCI of the LAD in 1999. In 2009 she had an MI and was treated with a bare-metal stent to the RCA. No other obstructive disease was noted. LV function was normal.  The patient denies any problems with chest pain, palpitations, dyspnea, dyspnea on exertion, dizziness, or presyncope. She is suffering from an upper respiratory infection today but from a cardiac standpoint has been stable.  Allergies  Allergen Reactions  . Hydrocodone Other (See Comments)    GI distress.    Current Outpatient Prescriptions on File Prior to Visit  Medication Sig Dispense Refill  . ALPRAZolam (XANAX) 0.25 MG tablet Take 0.25 mg by mouth. Takes 1/2 tablet as needed      . Cholecalciferol (VITAMIN D) 1000 UNITS capsule Take 1,000 Units by mouth as needed.       . dabigatran (PRADAXA) 150 MG CAPS Take 150 mg by mouth every 12 (twelve) hours.        . Glucosamine-Chondroit-Vit C-Mn (GLUCOSAMINE 1500 COMPLEX) CAPS Take 1 capsule by mouth daily.        . Multiple Vitamins-Calcium (ONE-A-DAY WOMENS PO) Take by mouth daily.        . nitroGLYCERIN (NITROSTAT) 0.4 MG SL tablet Place 0.4 mg under the tongue every 5 (five) minutes as needed.        Marland Kitchen omeprazole (PRILOSEC) 40 MG capsule Take 1 capsule (40 mg total) by mouth daily.  30 capsule  11  . PROCTOSOL HC 2.5 % rectal cream Place rectally daily.       . verapamil (COVERA HS) 180 MG (CO) 24 hr tablet Take 180 mg by mouth every morning.       . WELCHOL 625 MG tablet TAKE 3 TABLETS BY MOUTH TWO TIMES A DAY  540 tablet  3    Past Medical History  Diagnosis Date  . Herpes zoster   . PSVT (paroxysmal supraventricular tachycardia)   . Arteriosclerotic cardiovascular disease (ASCVD)      cath Feb '07- no obstructive dz - 20% proximal LAD only; EF 65%;  possible coronary artery spasm  . Hyperlipidemia     Lipid profile in 11/2008:282, 176, 64, 201  . Polio 1946    at age 67  . Peripheral neuropathy   . Anxiety   . Hypertension     diastolic dysfunction; normal CMet and TSH in 11/2009; normal CBC in 04/2010  . GERD (gastroesophageal reflux disease)     hiatal hernia  . Carcinoma of breast 2001    Left mastectomy with positive nodes  . Rectal bleed   . Chest pain     Long-standing and atypical    Past Surgical History  Procedure Date  . Mastectomy     Left;modified radical with lymph node dissection  . Cholecystectomy 1999  . Colonoscopy 02/22/2011    Procedure: COLONOSCOPY;  Surgeon: Malissa Hippo, MD;  Location: [facility];  Service: Endoscopy;  Laterality: N/A;  . Esophagogastroduodenoscopy 02/22/2011    Procedure: ESOPHAGOGASTRODUODENOSCOPY (EGD);  Surgeon: Malissa Hippo, MD;  Location: [facility];  Service: Endoscopy;  Laterality: N/A;    No family history on file.  History   Social History  . Marital Status: Widowed    Spouse Name: N/A    Number of Children: 2  . Years of Education:  N/A   Occupational History  . homemaker    Social History Main Topics  . Smoking status: Never Smoker   . Smokeless tobacco: Never Used  . Alcohol Use: No  . Drug Use: No  . Sexually Active: Not on file   Other Topics Concern  . Not on file   Social History Narrative  . No narrative on file    ROS: See HPI Eyes: Negative Ears:Negative for hearing loss, tinnitus Cardiovascular: Negative for chest pain, palpitations,irregular heartbeat, dyspnea, dyspnea on exertion, near-syncope, orthopnea, paroxysmal nocturnal dyspnia and syncope,edema, claudication, cyanosis,.  Respiratory:  Cough and congestion, Negative for hemoptysis, shortness of breath, sleep disturbances due to breathing, and wheezing.   Endocrine: Negative for cold intolerance and heat intolerance.  Hematologic/Lymphatic: Negative for adenopathy and bleeding  problem. Does not bruise/bleed easily.  Musculoskeletal: significant back pain   Gastrointestinal: Negative for nausea, vomiting, reflux, abdominal pain, diarrhea, constipation.   Neurological: Negative.  Allergic/Immunologic: Negative for environmental allergies.   PHYSICAL EXAM: Well-nournished, in no acute distress. Neck: No JVD, HJR, Bruit, or thyroid enlargement Lungs: decreased breath sounds but clear,No tachypnea, clear without wheezing, rales, or rhonchi Cardiovascular: RRR, PMI not displaced, heart sounds normal, no murmurs, gallops, bruit, thrill, or heave. Abdomen: BS normal. Soft without organomegaly, masses, lesions or tenderness. Extremities: without cyanosis, clubbing or edema. Good distal pulses bilateral SKin: Warm, no lesions or rashes  Musculoskeletal: No deformities Neuro: no focal signs  BP 131/85  Pulse 82  Resp 18  Ht 5\' 3"  (1.6 m)  Wt 177 lb (80.287 kg)  BMI 31.35 kg/m2  SpO2 100%  ION:GEXBMW sinus rhythm

## 2011-03-22 NOTE — Assessment & Plan Note (Addendum)
Patient has no symptoms indicating progression of coronary disease or symptomatic myocardial ischemia.  With only a minimal coronary lesion in the past, it should be safe to treat atrial arrhythmias with flecainide.

## 2011-03-22 NOTE — Patient Instructions (Signed)
   Follow up with Dr. Dietrich Pates in 1 week.  Start Flecainide 100 mg two times a day. Your physician recommends that you return for lab work fasting. Do not eat or drink after midnight.  A chest x-ray takes a picture of the organs and structures inside the chest, including the heart, lungs, and blood vessels. This test can show several things, including, whether the heart is enlarges; whether fluid is building up in the lungs; and whether pacemaker / defibrillator leads are still in place.

## 2011-03-22 NOTE — Assessment & Plan Note (Deleted)
Patient is here for pre-op cardiac clearance before undergoing disc surgery at [facility] in November. She has a long history of coronary artery disease and has not been evaluated a few years. We will order a Lexiscan prior to clearing her for surgery. 

## 2011-03-23 ENCOUNTER — Encounter: Payer: Self-pay | Admitting: Cardiology

## 2011-03-24 LAB — COMPREHENSIVE METABOLIC PANEL
BUN: 11 mg/dL (ref 6–23)
CO2: 24 mEq/L (ref 19–32)
Calcium: 9.3 mg/dL (ref 8.4–10.5)
Creat: 0.75 mg/dL (ref 0.50–1.10)
Glucose, Bld: 104 mg/dL — ABNORMAL HIGH (ref 70–99)
Total Bilirubin: 0.8 mg/dL (ref 0.3–1.2)

## 2011-03-24 LAB — LIPID PANEL
Cholesterol: 153 mg/dL (ref 0–200)
Triglycerides: 102 mg/dL (ref <150)
VLDL: 20 mg/dL (ref 0–40)

## 2011-03-24 LAB — CBC
Hemoglobin: 12.9 g/dL (ref 12.0–15.0)
MCH: 26.4 pg (ref 26.0–34.0)
MCHC: 30.6 g/dL (ref 30.0–36.0)
MCV: 86.3 fL (ref 78.0–100.0)

## 2011-03-24 LAB — BRAIN NATRIURETIC PEPTIDE: Brain Natriuretic Peptide: 312.4 pg/mL — ABNORMAL HIGH (ref 0.0–100.0)

## 2011-03-24 LAB — MAGNESIUM: Magnesium: 1.8 mg/dL (ref 1.5–2.5)

## 2011-03-24 LAB — TSH: TSH: 4.225 u[IU]/mL (ref 0.350–4.500)

## 2011-03-25 ENCOUNTER — Other Ambulatory Visit: Payer: Self-pay | Admitting: Internal Medicine

## 2011-03-27 ENCOUNTER — Telehealth: Payer: Self-pay | Admitting: *Deleted

## 2011-03-27 ENCOUNTER — Encounter: Payer: Self-pay | Admitting: Cardiology

## 2011-03-31 ENCOUNTER — Ambulatory Visit (INDEPENDENT_AMBULATORY_CARE_PROVIDER_SITE_OTHER): Payer: Medicare Other | Admitting: Cardiology

## 2011-03-31 ENCOUNTER — Encounter: Payer: Self-pay | Admitting: Cardiology

## 2011-03-31 DIAGNOSIS — I4891 Unspecified atrial fibrillation: Secondary | ICD-10-CM

## 2011-03-31 DIAGNOSIS — R079 Chest pain, unspecified: Secondary | ICD-10-CM

## 2011-03-31 DIAGNOSIS — B029 Zoster without complications: Secondary | ICD-10-CM

## 2011-03-31 DIAGNOSIS — Z7901 Long term (current) use of anticoagulants: Secondary | ICD-10-CM

## 2011-03-31 NOTE — Assessment & Plan Note (Signed)
Patient denies recent chest discomfort.

## 2011-03-31 NOTE — Assessment & Plan Note (Addendum)
Despite good control of heart rate at rest, [name] remains symptomatic.  While atrial fibrillation is not clearly the cause of her exertional dyspnea, she has no other known cardiopulmonary disease that would cause these symptoms.  I have recommended cardioversion, but she is reluctant. I explained that the appropriate course to determine whether atrial fibrillation is responsible for her symptoms involves cardioversion followed by reassessment of symptoms.  I quoted a 0.5% chance of CVA and a 0.1% chance of a more serious complication.  She understands and agrees to proceed.  A return office visit will be scheduled 3 weeks following electrical cardioversion.

## 2011-03-31 NOTE — Progress Notes (Signed)
HPI : [name] returns to the office as scheduled for continued assessment and treatment of atrial fibrillation.  Since her last visit, she is unchanged.  She continues to experience dyspnea with mild exertion and a fullness on both sides of her neck when she exerts herself, even modestly.  There has been no orthopnea, PND, lightheadedness or syncope.  She has noted only modest peripheral edema.  Current Outpatient Prescriptions on File Prior to Visit  Medication Sig Dispense Refill  . ALPRAZolam (XANAX) 0.25 MG tablet Take 0.25 mg by mouth. Takes 1/2 tablet as needed      . Cholecalciferol (VITAMIN D) 1000 UNITS capsule Take 1,000 Units by mouth as needed.       . dabigatran (PRADAXA) 150 MG CAPS Take 150 mg by mouth every 12 (twelve) hours.        . flecainide (TAMBOCOR) 100 MG tablet Take 1 tablet (100 mg total) by mouth 2 (two) times daily.  60 tablet  3  . Glucosamine-Chondroit-Vit C-Mn (GLUCOSAMINE 1500 COMPLEX) CAPS Take 1 capsule by mouth daily.        . Multiple Vitamins-Calcium (ONE-A-DAY WOMENS PO) Take by mouth daily.        . nitroGLYCERIN (NITROSTAT) 0.4 MG SL tablet Place 0.4 mg under the tongue every 5 (five) minutes as needed.        Marland Kitchen omeprazole (PRILOSEC) 40 MG capsule Take 1 capsule (40 mg total) by mouth daily.  30 capsule  11  . PROCTOSOL HC 2.5 % rectal cream Place rectally daily.       . verapamil (VERELAN PM) 180 MG 24 hr capsule TAKE ONE CAPSULE BY MOUTH ONCE A DAY  90 capsule  2  . WELCHOL 625 MG tablet TAKE 3 TABLETS BY MOUTH TWO TIMES A DAY  540 tablet  3     Allergies  Allergen Reactions  . Hydrocodone Other (See Comments)    GI distress.      Past medical history, social history, and family history reviewed and updated.  ROS: See history of present illness  PHYSICAL EXAM: BP 122/66  Pulse 86  Resp 18  Ht 5\' 1"  (1.549 m)  Wt 80.74 kg (178 lb)  BMI 33.63 kg/m2  SpO2 95%  General-Well developed; no acute distress Body habitus-overweight Neck-No JVD;  no carotid bruits Lungs-clear lung fields; resonant to percussion Cardiovascular-normal PMI; normal S1 and S2; grade 1/6 systolic ejection murmur at the cardiac base Abdomen-normal bowel sounds; soft and non-tender without masses or organomegaly Musculoskeletal-No deformities, no cyanosis or clubbing Neurologic-Normal cranial nerves; symmetric strength and tone Skin-Warm, no significant lesions Extremities-distal pulses intact; no edema  Rhythm Strip:  Atrial fibrillation with a controlled ventricular response; heart rate is 82 bpm. Lab:  BNP level-317, normal CBC, normal complete metabolic profile, magnesium of 1.8, Chest x-ray: prior left modified radical mastectomy, cardiomegaly, vascular redistribution and bronchitic changes.  ASSESSMENT AND PLAN:

## 2011-03-31 NOTE — Patient Instructions (Signed)
Your physician recommends that you return for lab work in: today  Your physician recommends that you have a cardioversion  Your physician recommends that you schedule a follow-up appointment in: 3 weeks

## 2011-04-06 LAB — BASIC METABOLIC PANEL
BUN: 14 mg/dL (ref 6–23)
Calcium: 9.1 mg/dL (ref 8.4–10.5)
Creat: 0.8 mg/dL (ref 0.50–1.10)
Glucose, Bld: 82 mg/dL (ref 70–99)

## 2011-04-06 LAB — CBC WITH DIFFERENTIAL/PLATELET
Basophils Absolute: 0.1 10*3/uL (ref 0.0–0.1)
Eosinophils Relative: 2 % (ref 0–5)
Lymphocytes Relative: 25 % (ref 12–46)
MCV: 85.6 fL (ref 78.0–100.0)
Neutro Abs: 4.1 10*3/uL (ref 1.7–7.7)
Platelets: 281 10*3/uL (ref 150–400)
RDW: 15.5 % (ref 11.5–15.5)
WBC: 6.7 10*3/uL (ref 4.0–10.5)

## 2011-04-06 LAB — APTT: aPTT: 63 seconds — ABNORMAL HIGH (ref 24–37)

## 2011-04-07 LAB — DIFFERENTIAL
Basophils Absolute: 0
Basophils Absolute: 0.1
Basophils Relative: 1
Eosinophils Absolute: 0.1
Eosinophils Relative: 1
Eosinophils Relative: 2
Lymphocytes Relative: 28
Lymphocytes Relative: 29
Lymphs Abs: 1.7
Neutrophils Relative %: 59

## 2011-04-07 LAB — HEPATIC FUNCTION PANEL
ALT: 16
Albumin: 3.3 — ABNORMAL LOW
Alkaline Phosphatase: 79
Total Protein: 6

## 2011-04-07 LAB — BASIC METABOLIC PANEL
Chloride: 102
GFR calc Af Amer: >60
GFR calc non Af Amer: >60
Potassium: 3.9
Sodium: 138

## 2011-04-07 LAB — CBC
HCT: 39.7
HCT: 41
Platelets: 268
Platelets: 289
RDW: 14
RDW: 14.6
WBC: 6

## 2011-04-07 LAB — CARDIAC PANEL(CRET KIN+CKTOT+MB+TROPI)
CK, MB: 1
CK, MB: 1
Relative Index: INVALID
Total CK: 79
Troponin I: 0.01

## 2011-04-07 LAB — POCT CARDIAC MARKERS
Operator id: 133351
Troponin i, poc: <0.05

## 2011-04-07 LAB — POCT I-STAT, CHEM 8
Calcium, Ion: 1.16
Chloride: 103
Glucose, Bld: 101 — ABNORMAL HIGH
HCT: 42
Hemoglobin: 14.3
TCO2: 29

## 2011-04-07 LAB — D-DIMER, QUANTITATIVE: D-Dimer, Quant: 0.3

## 2011-04-07 LAB — MAGNESIUM: Magnesium: 2.2

## 2011-04-10 ENCOUNTER — Encounter: Payer: Self-pay | Admitting: Cardiology

## 2011-04-10 ENCOUNTER — Telehealth: Payer: Self-pay | Admitting: Cardiology

## 2011-04-10 NOTE — Telephone Encounter (Signed)
PT CALLING TO SEE ABOUT TAKING FLECAINIDE AND PRADAXA BEFORE PROCEDURE SCHEDULED FOR 04/12/11. SHE ALSO HAS A COUGH AND WANTS TO KNOW IF THAT WILL MAKE A DIFFERENCE.

## 2011-04-12 ENCOUNTER — Encounter: Payer: Medicare Other | Admitting: *Deleted

## 2011-04-12 ENCOUNTER — Ambulatory Visit: Payer: Medicare Other | Admitting: Cardiology

## 2011-04-12 ENCOUNTER — Institutional Professional Consult (permissible substitution): Payer: Medicare Other | Admitting: Cardiology

## 2011-04-12 ENCOUNTER — Ambulatory Visit (HOSPITAL_COMMUNITY)
Admission: RE | Admit: 2011-04-12 | Discharge: 2011-04-12 | Disposition: A | Discharge status: Home / Self Care | Payer: Medicare Other | Source: Ambulatory Visit | Attending: Cardiology | Admitting: Cardiology

## 2011-04-12 DIAGNOSIS — I4891 Unspecified atrial fibrillation: Secondary | ICD-10-CM

## 2011-04-12 DIAGNOSIS — Z7901 Long term (current) use of anticoagulants: Secondary | ICD-10-CM | POA: Insufficient documentation

## 2011-04-12 HISTORY — DX: Long term (current) use of anticoagulants: Z79.01

## 2011-04-12 MED ORDER — MIDAZOLAM HCL 2 MG/2ML IJ SOLN
4.0000 mg | Freq: Once | INTRAMUSCULAR | Status: AC
  Administered 2011-04-12: 4 mg via INTRAVENOUS
  Filled 2011-04-12 (×2): qty 6

## 2011-04-12 MED ORDER — SODIUM CHLORIDE 0.9 % IJ SOLN
INTRAMUSCULAR | Status: AC
  Filled 2011-04-12: qty 10

## 2011-04-17 ENCOUNTER — Encounter (HOSPITAL_COMMUNITY): Payer: Self-pay

## 2011-04-17 ENCOUNTER — Emergency Department (HOSPITAL_COMMUNITY)
Admission: EM | Admit: 2011-04-17 | Discharge: 2011-04-17 | Disposition: A | Discharge status: Home / Self Care | Payer: Medicare Other | Attending: Emergency Medicine | Admitting: Emergency Medicine

## 2011-04-17 DIAGNOSIS — I4891 Unspecified atrial fibrillation: Secondary | ICD-10-CM | POA: Insufficient documentation

## 2011-04-17 DIAGNOSIS — R259 Unspecified abnormal involuntary movements: Secondary | ICD-10-CM | POA: Insufficient documentation

## 2011-04-17 DIAGNOSIS — R0602 Shortness of breath: Secondary | ICD-10-CM | POA: Insufficient documentation

## 2011-04-17 DIAGNOSIS — Z79899 Other long term (current) drug therapy: Secondary | ICD-10-CM | POA: Insufficient documentation

## 2011-04-17 NOTE — ED Notes (Signed)
Woke at 12:30 tonight, took xanax, and checked bp was high, went back to bed and woke around 2am with same, feels like blood is rushing to head

## 2011-04-17 NOTE — ED Provider Notes (Signed)
History     CSN: [id number] Arrival date & time: 04/17/2011  3:01 AM  Chief Complaint  Patient presents with  . Shortness of Breath  . Shaking    (Consider location/radiation/quality/duration/timing/severity/associated sxs/prior treatment) HPI Comments: Seen 32. Patient with h/o atrial fibrillation, hypertension, ASCVD, with sensation of fullness in her neck and ears, elevated blood pressure. She has had similar episodes in the past usually associated with minimal exertion. Seen by her cardiologist, Dr. Dietrich Pates on 04/01/11. At that time he obtained labs which were normal. She was in atrial fibrillation in his office and he performed a cardioversion.She is on persistent anticoagulation with pradaxa and ASA.  Review of previous records show cardiac cath in 2007 with EF 65%, non obstructive CAD. She denies fever, chills, vision changes, difficulty speaking or swallowing, nausea, vomiting, chest pain.  Patient is a 77 y.o. female presenting with shortness of breath. The history is provided by the patient (Patient states she woke at 12:30 AM and felt her heart beating in her ears and felt a fullness in her ears. Took her blood presssure which was  elevated at 150/126. Took a xanax. Awakened again at 2:30 with similar  sensation and elevated blood pressure).  Shortness of Breath  The current episode started today. The problem has been rapidly improving. The problem is moderate. The symptoms are relieved by nothing. The symptoms are aggravated by nothing. Associated symptoms include shortness of breath. Associated symptoms comments: Fullness and pressure in her neck and ears. Context: awakened from sleep. Urine output has been normal. The last void occurred less than 6 hours ago. There were no sick contacts.    Past Medical History  Diagnosis Date  . Herpes zoster   . Arteriosclerotic cardiovascular disease (ASCVD)      cath Feb '07- no obstructive dz - 20% proximal LAD only; EF 65%; possible  coronary artery spasm  . Hyperlipidemia     Lipid profile in 11/2008:282, 176, 64, 201  . Polio 1946    at age 80  . Peripheral neuropathy   . Anxiety   . Hypertension     diastolic dysfunction; normal CMet and TSH in 11/2009; normal CBC in 04/2010  . GERD (gastroesophageal reflux disease)     hiatal hernia  . Carcinoma of breast 2001    Left mastectomy with positive nodes  . Rectal bleed   . Chest pain     Long-standing and atypical  . Atrial fibrillation     Paroxysmal on event recorder in 2009; regular supraventricular tachycardia at a rate of 150 also identified on that study representing either atrial flutter or PSVT; onset of persistent AF in 03/2011    Past Surgical History  Procedure Date  . Mastectomy     Left;modified radical with lymph node dissection  . Cholecystectomy 1999  . Colonoscopy 02/22/2011    Procedure: COLONOSCOPY;  Surgeon: Malissa Hippo, MD;  Location: [facility];  Service: Endoscopy;  Laterality: N/A;  . Esophagogastroduodenoscopy 02/22/2011    Procedure: ESOPHAGOGASTRODUODENOSCOPY (EGD);  Surgeon: Malissa Hippo, MD;  Location: [facility];  Service: Endoscopy;  Laterality: N/A;  . Heart conversion     No family history on file.  History  Substance Use Topics  . Smoking status: Never Smoker   . Smokeless tobacco: Never Used  . Alcohol Use: No    OB History    Grav Para Term Preterm Abortions TAB SAB Ect Mult Living  Review of Systems  Respiratory: Positive for shortness of breath.   All other systems reviewed and are negative.    Allergies  Hydrocodone  Home Medications   Current Outpatient Rx  Name Route Sig Dispense Refill  . ALPRAZOLAM 0.25 MG PO TABS Oral Take 0.25 mg by mouth. Takes 1/2 tablet as needed    . VITAMIN D 1000 UNITS PO CAPS Oral Take 1,000 Units by mouth as needed.     Marland Kitchen DABIGATRAN ETEXILATE MESYLATE 150 MG PO CAPS Oral Take 150 mg by mouth every 12 (twelve) hours.      Marland Kitchen FLECAINIDE ACETATE  100 MG PO TABS Oral Take 1 tablet (100 mg total) by mouth 2 (two) times daily. 60 tablet 3  . GLUCOSAMINE 1500 COMPLEX PO CAPS Oral Take 1 capsule by mouth daily.      Marygrace Drought WOMENS PO Oral Take by mouth daily.      Marland Kitchen NITROGLYCERIN 0.4 MG SL SUBL Sublingual Place 0.4 mg under the tongue every 5 (five) minutes as needed.      Marland Kitchen OMEPRAZOLE 40 MG PO CPDR Oral Take 1 capsule (40 mg total) by mouth daily. 30 capsule 11  . PROCTOSOL HC 2.5 % RE CREA Rectal Place rectally daily.     Marland Kitchen VERAPAMIL HCL CR 180 MG PO CP24  TAKE ONE CAPSULE BY MOUTH ONCE A DAY 90 capsule 2  . WELCHOL 625 MG PO TABS  TAKE 3 TABLETS BY MOUTH TWO TIMES A DAY 540 tablet 3    BP 154/105  Pulse 93  Temp(Src) 97.9 F (36.6 C) (Oral)  Resp 18  Ht 5' 1.5" (1.562 m)  Wt 175 lb (79.379 kg)  BMI 32.53 kg/m2  SpO2 97%  Physical Exam  Constitutional: She is oriented to person, place, and time. She appears well-developed and well-nourished. No distress.  HENT:  Head: Normocephalic and atraumatic.  Eyes: EOM are normal.  Neck: Normal range of motion. Neck supple.  Cardiovascular: Normal heart sounds and intact distal pulses.        Irregular rhythm  Pulmonary/Chest: Effort normal and breath sounds normal.       S/p right mastectomy  Abdominal: Soft. Bowel sounds are normal.  Musculoskeletal: Normal range of motion. She exhibits no edema.  Neurological: She is alert and oriented to person, place, and time. She has normal reflexes.  Skin: Skin is warm and dry.    ED Course  Procedures (including critical care time)  Labs Reviewed - No data to display No results found.   No diagnosis found.  Date: 04/17/2011  0250  Rate: 94  Rhythm: atrial fibrillation  QRS Axis: left  Intervals: atrial fibrillation  ST/T Wave abnormalities: nonspecific ST changes  Conduction Disutrbances:RBBB  Narrative Interpretation:   Old EKG Reviewed: changes noted compared with 01/26/2008, now with atrial fibrillation  Patient with  sensation of fullness and heart beat in her ears and elevated blood pressure.. H/o atrial fibrillation with recent cardioversion. Patient was ambulated in the hallway. Repeat EKG unremarkable. Sensation was not duplicated. Blood pressure was initially elevated and normalized without intervention. Review of previous records and recent labs with no indication for further work up at this time.Pt stable in ED with no significant deterioration in condition.   Date: 04/17/2011 0401  Rate:82 Rhythm: normal sinus rhythm  QRS Axis: right  Intervals: normal  ST/T Wave abnormalities: normal  Conduction Disutrbances:incomplete right bundle branch block  Narrative Interpretation:   Old EKG Reviewed: change from previous EKG done in the  ER,now NSR  MDM Reviewed: previous chart, nursing note and vitals Reviewed previous: labs and ECG Interpretation: ECG Total time providing critical care: 40.            Nicoletta Dress. Colon Branch, MD 04/17/11 [phone]

## 2011-04-18 ENCOUNTER — Institutional Professional Consult (permissible substitution): Payer: Medicare Other | Admitting: Cardiology

## 2011-04-28 ENCOUNTER — Encounter: Payer: Self-pay | Admitting: Cardiology

## 2011-05-08 ENCOUNTER — Encounter: Payer: Self-pay | Admitting: *Deleted

## 2011-05-08 ENCOUNTER — Ambulatory Visit (INDEPENDENT_AMBULATORY_CARE_PROVIDER_SITE_OTHER): Payer: Medicare Other | Admitting: Cardiology

## 2011-05-08 ENCOUNTER — Other Ambulatory Visit: Payer: Self-pay | Admitting: Cardiology

## 2011-05-08 ENCOUNTER — Encounter: Payer: Self-pay | Admitting: Cardiology

## 2011-05-08 VITALS — BP 120/78 | HR 76 | Ht 61.0 in | Wt 176.0 lb

## 2011-05-08 DIAGNOSIS — E7800 Pure hypercholesterolemia, unspecified: Secondary | ICD-10-CM

## 2011-05-08 DIAGNOSIS — I4891 Unspecified atrial fibrillation: Secondary | ICD-10-CM

## 2011-05-08 DIAGNOSIS — Z7901 Long term (current) use of anticoagulants: Secondary | ICD-10-CM

## 2011-05-08 DIAGNOSIS — D649 Anemia, unspecified: Secondary | ICD-10-CM

## 2011-05-08 DIAGNOSIS — I2510 Atherosclerotic heart disease of native coronary artery without angina pectoris: Secondary | ICD-10-CM

## 2011-05-08 DIAGNOSIS — E785 Hyperlipidemia, unspecified: Secondary | ICD-10-CM

## 2011-05-08 DIAGNOSIS — K219 Gastro-esophageal reflux disease without esophagitis: Secondary | ICD-10-CM

## 2011-05-08 DIAGNOSIS — G609 Hereditary and idiopathic neuropathy, unspecified: Secondary | ICD-10-CM

## 2011-05-08 MED ORDER — AMLODIPINE BESYLATE 5 MG PO TABS: 5.0000 mg | ORAL_TABLET | Freq: Every day | ORAL | Status: DC

## 2011-05-08 MED ORDER — WARFARIN SODIUM 5 MG PO TABS: 5.0000 mg | ORAL_TABLET | Freq: Every day | ORAL | Status: DC

## 2011-05-08 NOTE — Patient Instructions (Addendum)
Your physician recommends that you return for lab work in:   1 - Today (flecanide level) 2 - Fasting Lipids in 1 month  Your physician has recommended you make the following change in your medication:   1 - STOP Pradaxa on November 1st 2 - Start Coumadin 5mg  daily, starting today and check blood level in 1 week 3 - STOP Welchol  Stool cards x 3 and return to office as soon as possible.

## 2011-05-08 NOTE — Assessment & Plan Note (Signed)
Patient has not had much in the way of chest discomfort in recent months, but has been taking nitroglycerin for palpitations.  I assured her that this is not necessary and that she does not need to renew her prescription in light of the absence of significant cardiovascular disease.

## 2011-05-08 NOTE — Assessment & Plan Note (Addendum)
CBC will be followed and stool obtained for Hemoccult testing to exclude occult GI blood loss.  Patient requests substitution of warfarin for dabigatran in order to reduce her drug costs.  I explained the additional expense with warfarin monitoring, but she wishes to proceed.

## 2011-05-08 NOTE — Assessment & Plan Note (Addendum)
Recent history does not indicate another episode of atrial fibrillation or supraventricular tachycardia.  Although recurrent AF is likely to occur in the future, the time course is uncertain.  We will maintain anticoagulation for the present.    It is unclear whether cardioversion resulted in significant symptomatic improvement.  In any case, the patient feels fine today while in sinus bradycardia.  Current medications will be continued except as noted above.  A flecainide level will be obtained.

## 2011-05-08 NOTE — Progress Notes (Signed)
HPI : [name] returns to the office following a recent DC cardioversion.  She reports that she was seen in the emergency department one week after cardioversion for an unusual sensation in the left side of her head.  She thought that she might be suffering a CVA, but was told by the emergency department physician that no abnormalities were found.  I cannot locate a record of that interaction.  Since the day after being sent home from the emergency department, she has felt wonderful.  She experiences no lightheadedness, no chest discomfort and no dyspnea.  She has had no palpitations and no syncope.    Current Outpatient Prescriptions on File Prior to Visit  Medication Sig Dispense Refill  . ALPRAZolam (XANAX) 0.25 MG tablet Take 0.25 mg by mouth. Takes 1/2 tablet as needed      . Cholecalciferol (VITAMIN D) 1000 UNITS capsule Take 1,000 Units by mouth as needed.       . dabigatran (PRADAXA) 150 MG CAPS Take 150 mg by mouth every 12 (twelve) hours.        . flecainide (TAMBOCOR) 100 MG tablet Take 1 tablet (100 mg total) by mouth 2 (two) times daily.  60 tablet  3  . Glucosamine-Chondroit-Vit C-Mn (GLUCOSAMINE 1500 COMPLEX) CAPS Take 1 capsule by mouth daily.        . Multiple Vitamins-Calcium (ONE-A-DAY WOMENS PO) Take by mouth daily.        . nitroGLYCERIN (NITROSTAT) 0.4 MG SL tablet Place 0.4 mg under the tongue every 5 (five) minutes as needed.        Marland Kitchen omeprazole (PRILOSEC) 40 MG capsule Take 1 capsule (40 mg total) by mouth daily.  30 capsule  11  . PROCTOSOL HC 2.5 % rectal cream Place rectally daily.       . verapamil (VERELAN PM) 180 MG 24 hr capsule TAKE ONE CAPSULE BY MOUTH ONCE A DAY  90 capsule  2  . WELCHOL 625 MG tablet TAKE 3 TABLETS BY MOUTH TWO TIMES A DAY  540 tablet  3     Allergies  Allergen Reactions  . Hydrocodone Other (See Comments)    GI distress.      Past medical history, social history, and family history reviewed and updated.  ROS: See history of present  illness.  PHYSICAL EXAM: BP 120/78  Pulse 76  Ht 5\' 1"  (1.549 m)  Wt 176 lb (79.833 kg)  BMI 33.25 kg/m2  SpO2 95%  General-Well developed; no acute distress Body habitus-moderately overweight Neck-No JVD; no carotid bruits Lungs-clear lung fields; resonant to percussion Cardiovascular-normal PMI; normal S1 prominent splitting of S2; fourth heart sound present Abdomen-normal bowel sounds; soft and non-tender without masses or organomegaly Musculoskeletal-No deformities, no cyanosis or clubbing Neurologic-Normal cranial nerves; symmetric strength and tone Skin-Warm, no significant lesions Extremities-distal pulses intact; trace edema  Rhythm Strip: Normal sinus rhythm at a rate of 75 bpm; borderline first degree AV block  ASSESSMENT AND PLAN:

## 2011-05-08 NOTE — Assessment & Plan Note (Signed)
Total and LDL cholesterol were quite high when assessed in 2010.  Recent measurement with patient treated only with WelChol is dramatically improved, more than I would expect.  She requests discontinuation of WelChol due to expense.  We will do so, check a repeat lipid profile off drugs and then determine whether pharmacologic therapy will be necessary and feasible.

## 2011-05-08 NOTE — Assessment & Plan Note (Signed)
Patient had minimal coronary disease at catheterization in 2007.  She merits reasonable, but not aggressive control of cardiovascular risk factors.

## 2011-05-09 ENCOUNTER — Encounter: Payer: Self-pay | Admitting: Cardiology

## 2011-05-09 ENCOUNTER — Telehealth: Payer: Self-pay | Admitting: Cardiology

## 2011-05-09 ENCOUNTER — Other Ambulatory Visit: Payer: Self-pay | Admitting: *Deleted

## 2011-05-09 LAB — CBC
HCT: 45.7 % (ref 36.0–46.0)
Hemoglobin: 14.2 g/dL (ref 12.0–15.0)
MCH: 26.1 pg (ref 26.0–34.0)
MCHC: 31.1 g/dL (ref 30.0–36.0)
MCV: 84 fL (ref 78.0–100.0)
Platelets: 299 K/uL (ref 150–400)
RBC: 5.44 MIL/uL — ABNORMAL HIGH (ref 3.87–5.11)
RDW: 15.5 % (ref 11.5–15.5)
WBC: 6.8 K/uL (ref 4.0–10.5)

## 2011-05-09 LAB — IRON AND TIBC
%SAT: 46 % (ref 20–55)
Iron: 190 ug/dL — ABNORMAL HIGH (ref 42–145)
TIBC: 409 ug/dL (ref 250–470)

## 2011-05-09 LAB — LIPID PANEL: Cholesterol: 230 mg/dL — ABNORMAL HIGH (ref 0–200)

## 2011-05-09 NOTE — Telephone Encounter (Signed)
Patient has questions regarding new medications from visit yesterday. / tg

## 2011-05-09 NOTE — Telephone Encounter (Signed)
Spoke to patient regarding medication questions.  Verbalizes understanding.

## 2011-05-10 ENCOUNTER — Encounter: Payer: Self-pay | Admitting: *Deleted

## 2011-05-10 ENCOUNTER — Other Ambulatory Visit: Payer: Self-pay | Admitting: *Deleted

## 2011-05-10 DIAGNOSIS — E7800 Pure hypercholesterolemia, unspecified: Secondary | ICD-10-CM

## 2011-05-15 ENCOUNTER — Ambulatory Visit (INDEPENDENT_AMBULATORY_CARE_PROVIDER_SITE_OTHER): Payer: Medicare Other | Admitting: *Deleted

## 2011-05-15 DIAGNOSIS — Z7901 Long term (current) use of anticoagulants: Secondary | ICD-10-CM

## 2011-05-15 DIAGNOSIS — I4891 Unspecified atrial fibrillation: Secondary | ICD-10-CM

## 2011-05-15 LAB — POCT INR: INR: 2.6

## 2011-05-15 NOTE — Progress Notes (Signed)
Not due until November 29th

## 2011-05-18 ENCOUNTER — Encounter (INDEPENDENT_AMBULATORY_CARE_PROVIDER_SITE_OTHER): Payer: Medicare Other

## 2011-05-18 ENCOUNTER — Other Ambulatory Visit: Payer: Self-pay | Admitting: *Deleted

## 2011-05-18 DIAGNOSIS — Z7901 Long term (current) use of anticoagulants: Secondary | ICD-10-CM

## 2011-05-18 LAB — FLECAINIDE LEVEL

## 2011-05-18 MED ORDER — FLECAINIDE ACETATE 100 MG PO TABS: 100.0000 mg | ORAL_TABLET | Freq: Two times a day (BID) | ORAL | Status: DC

## 2011-05-22 ENCOUNTER — Ambulatory Visit (INDEPENDENT_AMBULATORY_CARE_PROVIDER_SITE_OTHER): Payer: Medicare Other | Admitting: *Deleted

## 2011-05-22 ENCOUNTER — Other Ambulatory Visit: Payer: Self-pay | Admitting: *Deleted

## 2011-05-22 DIAGNOSIS — I4891 Unspecified atrial fibrillation: Secondary | ICD-10-CM

## 2011-05-22 DIAGNOSIS — Z7901 Long term (current) use of anticoagulants: Secondary | ICD-10-CM

## 2011-05-22 LAB — POCT INR: INR: 4

## 2011-05-22 MED ORDER — WARFARIN SODIUM 5 MG PO TABS: 5.0000 mg | ORAL_TABLET | Freq: Every day | ORAL | Status: DC

## 2011-05-22 MED ORDER — FLECAINIDE ACETATE 100 MG PO TABS: 100.0000 mg | ORAL_TABLET | Freq: Two times a day (BID) | ORAL | Status: DC

## 2011-05-23 ENCOUNTER — Other Ambulatory Visit: Payer: Self-pay

## 2011-05-23 DIAGNOSIS — Z7901 Long term (current) use of anticoagulants: Secondary | ICD-10-CM

## 2011-05-24 ENCOUNTER — Other Ambulatory Visit: Payer: Self-pay

## 2011-05-24 DIAGNOSIS — Z7901 Long term (current) use of anticoagulants: Secondary | ICD-10-CM

## 2011-05-25 ENCOUNTER — Telehealth: Payer: Self-pay | Admitting: *Deleted

## 2011-05-25 NOTE — Telephone Encounter (Signed)
Results called to patient.

## 2011-05-31 ENCOUNTER — Ambulatory Visit (INDEPENDENT_AMBULATORY_CARE_PROVIDER_SITE_OTHER): Payer: Medicare Other | Admitting: *Deleted

## 2011-05-31 DIAGNOSIS — Z7901 Long term (current) use of anticoagulants: Secondary | ICD-10-CM

## 2011-05-31 DIAGNOSIS — I4891 Unspecified atrial fibrillation: Secondary | ICD-10-CM

## 2011-05-31 LAB — POCT INR: INR: 2.3

## 2011-06-07 ENCOUNTER — Encounter: Payer: Medicare Other | Admitting: *Deleted

## 2011-06-08 ENCOUNTER — Other Ambulatory Visit: Payer: Self-pay | Admitting: Cardiology

## 2011-06-09 LAB — LIPID PANEL
Cholesterol: 253 mg/dL — ABNORMAL HIGH (ref 0–200)
Total CHOL/HDL Ratio: 3.5 Ratio

## 2011-06-12 ENCOUNTER — Other Ambulatory Visit: Payer: Self-pay | Admitting: *Deleted

## 2011-06-12 ENCOUNTER — Telehealth: Payer: Self-pay | Admitting: *Deleted

## 2011-06-12 ENCOUNTER — Ambulatory Visit (INDEPENDENT_AMBULATORY_CARE_PROVIDER_SITE_OTHER): Payer: Medicare Other | Admitting: *Deleted

## 2011-06-12 ENCOUNTER — Encounter: Payer: Self-pay | Admitting: *Deleted

## 2011-06-12 DIAGNOSIS — Z7901 Long term (current) use of anticoagulants: Secondary | ICD-10-CM

## 2011-06-12 DIAGNOSIS — I4891 Unspecified atrial fibrillation: Secondary | ICD-10-CM

## 2011-06-12 DIAGNOSIS — E782 Mixed hyperlipidemia: Secondary | ICD-10-CM

## 2011-06-12 LAB — POCT INR: INR: 2.5

## 2011-06-12 NOTE — Telephone Encounter (Signed)
Patient is not willing to take a statin at this time.  States she wants to try red yeast rice and see what this does.  Check lipids in 1 month and revisit other options if this has not resolved her abnormal results.

## 2011-06-14 ENCOUNTER — Encounter: Payer: Self-pay | Admitting: *Deleted

## 2011-06-28 ENCOUNTER — Ambulatory Visit (INDEPENDENT_AMBULATORY_CARE_PROVIDER_SITE_OTHER): Payer: Medicare Other | Admitting: *Deleted

## 2011-06-28 ENCOUNTER — Encounter: Payer: Medicare Other | Admitting: *Deleted

## 2011-06-28 DIAGNOSIS — Z7901 Long term (current) use of anticoagulants: Secondary | ICD-10-CM

## 2011-06-28 DIAGNOSIS — I4891 Unspecified atrial fibrillation: Secondary | ICD-10-CM

## 2011-06-28 LAB — POCT INR: INR: 2.7

## 2011-07-17 ENCOUNTER — Ambulatory Visit (INDEPENDENT_AMBULATORY_CARE_PROVIDER_SITE_OTHER): Payer: BC Managed Care – PPO | Admitting: *Deleted

## 2011-07-17 ENCOUNTER — Other Ambulatory Visit: Payer: Self-pay | Admitting: Cardiology

## 2011-07-17 DIAGNOSIS — Z7901 Long term (current) use of anticoagulants: Secondary | ICD-10-CM

## 2011-07-17 DIAGNOSIS — I4891 Unspecified atrial fibrillation: Secondary | ICD-10-CM

## 2011-07-18 ENCOUNTER — Encounter: Payer: Self-pay | Admitting: *Deleted

## 2011-07-18 LAB — LIPID PANEL
Cholesterol: 251 mg/dL — ABNORMAL HIGH (ref 0–200)
HDL: 66 mg/dL (ref >39)
LDL Cholesterol: 149 mg/dL — ABNORMAL HIGH (ref 0–99)
Triglycerides: 181 mg/dL — ABNORMAL HIGH (ref <150)

## 2011-07-19 ENCOUNTER — Encounter: Payer: Medicare Other | Admitting: *Deleted

## 2011-08-14 ENCOUNTER — Encounter: Payer: BC Managed Care – PPO | Admitting: *Deleted

## 2011-08-17 ENCOUNTER — Ambulatory Visit (INDEPENDENT_AMBULATORY_CARE_PROVIDER_SITE_OTHER): Payer: Medicare Other | Admitting: *Deleted

## 2011-08-17 DIAGNOSIS — I4891 Unspecified atrial fibrillation: Secondary | ICD-10-CM

## 2011-08-17 DIAGNOSIS — Z7901 Long term (current) use of anticoagulants: Secondary | ICD-10-CM

## 2011-09-07 ENCOUNTER — Encounter: Payer: Self-pay | Admitting: *Deleted

## 2011-09-07 ENCOUNTER — Ambulatory Visit (INDEPENDENT_AMBULATORY_CARE_PROVIDER_SITE_OTHER): Payer: Medicare Other | Admitting: Cardiology

## 2011-09-07 ENCOUNTER — Encounter: Payer: Self-pay | Admitting: Cardiology

## 2011-09-07 ENCOUNTER — Ambulatory Visit (INDEPENDENT_AMBULATORY_CARE_PROVIDER_SITE_OTHER): Payer: Medicare Other | Admitting: *Deleted

## 2011-09-07 ENCOUNTER — Ambulatory Visit: Payer: Medicare Other | Admitting: Cardiology

## 2011-09-07 VITALS — BP 125/73 | HR 75 | Ht 61.5 in | Wt 181.0 lb

## 2011-09-07 DIAGNOSIS — Z7901 Long term (current) use of anticoagulants: Secondary | ICD-10-CM

## 2011-09-07 DIAGNOSIS — R079 Chest pain, unspecified: Secondary | ICD-10-CM

## 2011-09-07 DIAGNOSIS — F411 Generalized anxiety disorder: Secondary | ICD-10-CM

## 2011-09-07 DIAGNOSIS — I4891 Unspecified atrial fibrillation: Secondary | ICD-10-CM

## 2011-09-07 DIAGNOSIS — E785 Hyperlipidemia, unspecified: Secondary | ICD-10-CM

## 2011-09-07 DIAGNOSIS — I10 Essential (primary) hypertension: Secondary | ICD-10-CM

## 2011-09-07 DIAGNOSIS — I2510 Atherosclerotic heart disease of native coronary artery without angina pectoris: Secondary | ICD-10-CM

## 2011-09-07 MED ORDER — ALPRAZOLAM 0.25 MG PO TABS: 0.2500 mg | ORAL_TABLET | Freq: Three times a day (TID) | ORAL | Status: DC | PRN

## 2011-09-07 MED ORDER — PRAVASTATIN SODIUM 20 MG PO TABS: 20.0000 mg | ORAL_TABLET | Freq: Every day | ORAL | Status: DC

## 2011-09-07 NOTE — Patient Instructions (Addendum)
Your physician recommends that you schedule a follow-up appointment in: 12 months - earlier if you have palpitations or a fast heart rate  Stool cards x 3 and return to the office as soon as possible   Your physician has recommended you make the following change in your medication:  1 - START -  Pravachol 20 mg 1/2 tablet daily if tolerated well, increase to 1 tablet a day  Your physician recommends that you return for lab work in: 2 months

## 2011-09-07 NOTE — Assessment & Plan Note (Addendum)
Patient had minimal coronary disease when assessed 6 years ago.  Based upon symptoms there is no reason to suspect  that she has had progression.

## 2011-09-07 NOTE — Assessment & Plan Note (Signed)
Patient switched from dabigatran to warfarin due to cost.  She is doing well on the latter agent.  We will continue to monitor stool Hemoccults and CBCs to exclude occult GI blood loss.  Due to a family history of premature colon cancer, she undergoes colonoscopy every 5 years, most recently 6 months ago at which time no biopsies or polypectomies were required.

## 2011-09-07 NOTE — Assessment & Plan Note (Signed)
Blood pressure control is excellent, both when checked in office and by the patient during her usual daily activities.  Current medication will be continued.

## 2011-09-07 NOTE — Assessment & Plan Note (Signed)
Xanax renewed at patient's request.  100 tablets of 0.25 mg provided.  She typically utilizes a minimal 0.125 mg dose, , but notes that this significantly improves her sense of shakiness and dyspnea.

## 2011-09-07 NOTE — Progress Notes (Signed)
Patient ID: [name], female   DOB: [birth year], 78 y.o.   MRN: [id number] HPI: Scheduled return visit for this very nice woman with paroxysmal atrial fibrillation.  Since beginning treatment with flecainide, she has experienced no palpitations or other symptoms to suggest recurrent arrhythmia.  She occasionally experiences brief episodes of dyspnea at rest associated with anxiety and relieved with Xanax, which she takes very infrequently.  Prior to Admission medications   Medication Sig Start Date End Date Taking? Authorizing Provider  ALPRAZolam (XANAX) 0.25 MG tablet Take 1 tablet (0.25 mg total) by mouth 3 (three) times daily as needed for sleep. Takes 1/2 tablet as needed 09/07/11  Yes Gerrit Friends. Brinden Kincheloe, MD  dexamethasone (DECADRON) 0.1 % ophthalmic suspension Place 1 drop into both eyes 2 (two) times daily.   Yes Historical Provider, MD  fish oil-omega-3 fatty acids 1000 MG capsule Take 1,000 mg by mouth daily.   Yes Historical Provider, MD  flecainide (TAMBOCOR) 100 MG tablet Take 1 tablet (100 mg total) by mouth 2 (two) times daily. 05/22/11 05/21/12 Yes Gerrit Friends. Takera Rayl, MD  Glucosamine-Chondroit-Vit C-Mn (GLUCOSAMINE 1500 COMPLEX) CAPS Take 1 capsule by mouth daily.     Yes Historical Provider, MD  Multiple Vitamins-Calcium (ONE-A-DAY WOMENS PO) Take by mouth daily.     Yes Historical Provider, MD  nitroGLYCERIN (NITROSTAT) 0.4 MG SL tablet Place 0.4 mg under the tongue every 5 (five) minutes as needed.     Yes Historical Provider, MD  omeprazole (PRILOSEC) 40 MG capsule Take 1 capsule (40 mg total) by mouth daily. 03/01/11 02/29/12 Yes Malissa Hippo, MD  verapamil (VERELAN PM) 180 MG 24 hr capsule TAKE ONE CAPSULE BY MOUTH ONCE A DAY 03/25/11  Yes Gerrit Friends. Louvina Cleary, MD  warfarin (COUMADIN) 5 MG tablet Take 1 tablet (5 mg total) by mouth daily. 05/22/11 05/21/12 Yes Gerrit Friends. Chibuike Fleek, MD  pravastatin (PRAVACHOL) 20 MG tablet Take 1 tablet (20 mg total) by mouth daily. 09/07/11 09/06/12   Gerrit Friends. Dietrich Pates, MD    Allergies  Allergen Reactions  . Atorvastatin Other (See Comments)    Myalgias  . Hydrocodone Other (See Comments)    GI distress.    Past medical history, social history, and family history reviewed and updated.  ROS: Denies orthopnea, PND, chest discomfort or pedal edema.   PHYSICAL EXAM: BP 125/73  Pulse 75  Ht 5' 1.5" (1.562 m)  Wt 82.101 kg (181 lb)  BMI 33.65 kg/m2  SpO2 94%  General-Well developed; no acute distress Body habitus-Overweight Neck-No JVD; no carotid bruits Lungs-clear lung fields; resonant to percussion: Mild kyphosis Cardiovascular-normal PMI; normal S1 and S2; regular rhythm; S4 present; prominent splitting of S2 Abdomen-normal bowel sounds; soft and non-tender without masses or organomegaly Musculoskeletal-No deformities, no cyanosis or clubbing Neurologic-Normal cranial nerves; symmetric strength and tone Skin-Warm, no significant lesions Extremities-distal pulses intact; no edema  Rhythm Strip: Normal sinus rhythm; borderline first-degree AV block; IVCD old  ASSESSMENT AND PLAN:  [place] Bing, MD 09/07/2011 3:51 PM

## 2011-09-07 NOTE — Assessment & Plan Note (Signed)
No recent chest discomfort noted.

## 2011-09-07 NOTE — Assessment & Plan Note (Addendum)
Patient developed GI distress with red yeast rice and has discontinued that supplement.  Pharmacologic therapy of moderate hyperlipidemia is appropriate if we consider her mild coronary disease to indicate the presence of atherosclerosis.  I am inclined to do so.  She suffered myalgias with moderate dose atorvastatin.  We will start very low-dose pravastatin to determine if she can tolerate any statin.

## 2011-09-28 ENCOUNTER — Encounter (INDEPENDENT_AMBULATORY_CARE_PROVIDER_SITE_OTHER): Payer: Medicare Other

## 2011-09-28 DIAGNOSIS — Z7901 Long term (current) use of anticoagulants: Secondary | ICD-10-CM

## 2011-09-29 ENCOUNTER — Other Ambulatory Visit: Payer: Self-pay

## 2011-09-29 ENCOUNTER — Encounter: Payer: Self-pay | Admitting: *Deleted

## 2011-09-29 DIAGNOSIS — Z7901 Long term (current) use of anticoagulants: Secondary | ICD-10-CM

## 2011-10-05 ENCOUNTER — Ambulatory Visit (INDEPENDENT_AMBULATORY_CARE_PROVIDER_SITE_OTHER): Payer: Medicare Other | Admitting: *Deleted

## 2011-10-05 DIAGNOSIS — Z7901 Long term (current) use of anticoagulants: Secondary | ICD-10-CM

## 2011-10-05 DIAGNOSIS — I4891 Unspecified atrial fibrillation: Secondary | ICD-10-CM

## 2011-10-11 ENCOUNTER — Ambulatory Visit: Payer: Medicare Other | Admitting: Gastroenterology

## 2011-10-12 ENCOUNTER — Ambulatory Visit (INDEPENDENT_AMBULATORY_CARE_PROVIDER_SITE_OTHER): Payer: Medicare Other | Admitting: Internal Medicine

## 2011-10-19 ENCOUNTER — Encounter (INDEPENDENT_AMBULATORY_CARE_PROVIDER_SITE_OTHER): Payer: Self-pay | Admitting: Internal Medicine

## 2011-10-19 ENCOUNTER — Ambulatory Visit (INDEPENDENT_AMBULATORY_CARE_PROVIDER_SITE_OTHER): Payer: Medicare Other | Admitting: Internal Medicine

## 2011-10-19 VITALS — BP 110/70 | HR 64 | Temp 98.0°F | Ht 61.5 in | Wt 176.2 lb

## 2011-10-19 DIAGNOSIS — R141 Gas pain: Secondary | ICD-10-CM

## 2011-10-19 DIAGNOSIS — R143 Flatulence: Secondary | ICD-10-CM

## 2011-10-19 NOTE — Progress Notes (Signed)
Subjective:     Patient ID: [name], female   DOB: Jul 30, 1931, 78 y.o.   MRN: [id number]  HPI [name] is a 78 yr old female presenting today with c/o that everything she eats is giving her gas. She says it is very embarrassing and feels she  does not have any control.  Symptoms for several months.  Mostly anything she eats, particularly broccoli, beans, potatoes will give her gas.  Appetite is good.  Weight loss (just a few pounds) intentional. No abdominal pain. She usually has a BM about once or twice a day. No melena or bright red rectal bleeding.  Sometimes if she has stands for a long period time she will have perineal irritation.  Hemocult negative.  She tells me that she tried Gas X and it did not work.  EGD/ED and Colonoscopy 02/22/2012: Impression:  1.Mild changes of esophagitis at GE junction without obvious stricture or ring. This segment dilated with a balloon to 18 mm but no mucosal disruption noted. Suspect she may have esophageal motility disorder.  Normal examination of the stomach, first and the second part of the duodenum.  2.Normal terminal ileum.  Sigmoid diverticulosis.  External hemorrhoids felt to be source of her hematochezia.   Review of Systems see hpi Current Outpatient Prescriptions  Medication Sig Dispense Refill  . ALPRAZolam (XANAX) 0.25 MG tablet Take 1 tablet (0.25 mg total) by mouth 3 (three) times daily as needed for sleep. Takes 1/2 tablet as needed  100 tablet  0  . fish oil-omega-3 fatty acids 1000 MG capsule Take 1,000 mg by mouth daily.      . flecainide (TAMBOCOR) 100 MG tablet Take 1 tablet (100 mg total) by mouth 2 (two) times daily.  180 tablet  3  . Glucosamine-Chondroit-Vit C-Mn (GLUCOSAMINE 1500 COMPLEX) CAPS Take 1 capsule by mouth daily.        . Multiple Vitamins-Calcium (ONE-A-DAY WOMENS PO) Take by mouth daily.        Marland Kitchen omeprazole (PRILOSEC) 40 MG capsule Take 1 capsule (40 mg total) by mouth daily.  30 capsule  11  . pravastatin  (PRAVACHOL) 20 MG tablet Take 1 tablet (20 mg total) by mouth daily.  30 tablet  12  . verapamil (VERELAN PM) 180 MG 24 hr capsule TAKE ONE CAPSULE BY MOUTH ONCE A DAY  90 capsule  2  . warfarin (COUMADIN) 5 MG tablet Take 1 tablet (5 mg total) by mouth daily.  90 tablet  3   Past Medical History  Diagnosis Date  . Herpes zoster   . Arteriosclerotic cardiovascular disease (ASCVD)      cath Feb '07- no obstructive dz - 20% proximal LAD only; EF 65%; possible coronary artery spasm  . Hyperlipidemia     Lipid profile in 11/2008:282, 176, 64, 201  . Polio 1946    at age 35  . Peripheral neuropathy   . Anxiety   . Hypertension     diastolic dysfunction; normal CMet and TSH in 11/2009; normal CBC in 04/2010  . GERD (gastroesophageal reflux disease)     hiatal hernia  . Carcinoma of breast 2001    Left mastectomy with positive nodes  . Rectal bleed   . Chest pain     Long-standing and atypical  . Atrial fibrillation     Paroxysmal on event recorder in 2009; regular supraventricular tachycardia at a rate of 150 also identified on that study representing either atrial flutter or PSVT; onset of persistent AF in  03/2011  . Chronic anticoagulation 04/12/2011   Past Surgical History  Procedure Date  . Mastectomy     Left;modified radical with lymph node dissection  . Cholecystectomy 1999  . Colonoscopy 02/22/2011    Procedure: COLONOSCOPY;  Surgeon: Malissa Hippo, MD;  [facility];  Service: Endoscopy;  Laterality: N/A;; performed for scant hematochezia  . Esophagogastroduodenoscopy 02/22/2011    Procedure: ESOPHAGOGASTRODUODENOSCOPY (EGD);  Surgeon: Malissa Hippo, MD;  Location: [facility];  Service: Endoscopy;  Laterality: N/A;   Family Status  Relation Status Death Age  . Mother Deceased     heart disease  . Father Deceased deceased    multiple myloma  . Sister Deceased     lung cancer  . Sister Deceased     diaylsis  . Brother Deceased     renal cell carcinoma  .  Brother Deceased     lymphoma  . Sister Alive     diabectic   History   Social History  . Marital Status: Widowed    Spouse Name: N/A    Number of Children: 2  . Years of Education: N/A   Occupational History  . homemaker    Social History Main Topics  . Smoking status: Never Smoker   . Smokeless tobacco: Never Used  . Alcohol Use: No  . Drug Use: No  . Sexually Active: Not Currently   Other Topics Concern  . Not on file   Social History Narrative  . No narrative on file   Allergies  Allergen Reactions  . Atorvastatin Other (See Comments)    Myalgias  . Hydrocodone Other (See Comments)    GI distress.       Objective:   Physical ExamThere were no vitals filed for this visit. Alert and oriented. Skin warm and dry. Oral mucosa is moist.   . Sclera anicteric, conjunctivae is pink. Thyroid not enlarged. No cervical lymphadenopathy. Lungs clear. Heart regular rate and rhythm.  Abdomen is soft. Bowel sounds are positive. No hepatomegaly. No abdominal masses felt. No tenderness.  No edema to lower extremities. Patient is alert and oriented.      Assessment:    Flatus. No alarm symptoms. Colonoscopy last year normal.    Plan:    Flatus hand out given to patient and reviewed. Align samples given to patient to try.  Advised to try Gaviscon. PR in 2 weeks.

## 2011-10-19 NOTE — Patient Instructions (Signed)
Flatus hand out given to patient.  Algin samples. Advised to try Gaviscon

## 2011-10-26 ENCOUNTER — Ambulatory Visit (INDEPENDENT_AMBULATORY_CARE_PROVIDER_SITE_OTHER): Payer: Medicare Other | Admitting: *Deleted

## 2011-10-26 DIAGNOSIS — Z7901 Long term (current) use of anticoagulants: Secondary | ICD-10-CM

## 2011-10-26 DIAGNOSIS — I4891 Unspecified atrial fibrillation: Secondary | ICD-10-CM

## 2011-11-02 ENCOUNTER — Other Ambulatory Visit: Payer: Self-pay | Admitting: *Deleted

## 2011-11-02 DIAGNOSIS — I4891 Unspecified atrial fibrillation: Secondary | ICD-10-CM

## 2011-11-06 ENCOUNTER — Encounter: Payer: Self-pay | Admitting: *Deleted

## 2011-11-09 ENCOUNTER — Telehealth (INDEPENDENT_AMBULATORY_CARE_PROVIDER_SITE_OTHER): Payer: Self-pay | Admitting: *Deleted

## 2011-11-09 NOTE — Telephone Encounter (Signed)
Samples of Align, 5 boxes given to patient

## 2011-11-09 NOTE — Telephone Encounter (Signed)
Patient wants you to call her today in regards to medicine you gave her -- [phone]

## 2011-11-16 ENCOUNTER — Telehealth: Payer: Self-pay | Admitting: *Deleted

## 2011-11-16 NOTE — Telephone Encounter (Signed)
Requested lab work has not been obtained, despite letter sent.  Attempted to contact by phone without success.

## 2011-11-20 ENCOUNTER — Other Ambulatory Visit: Payer: Self-pay | Admitting: *Deleted

## 2011-11-20 ENCOUNTER — Other Ambulatory Visit: Payer: Self-pay | Admitting: Cardiology

## 2011-11-20 DIAGNOSIS — Z7901 Long term (current) use of anticoagulants: Secondary | ICD-10-CM

## 2011-11-20 DIAGNOSIS — I4891 Unspecified atrial fibrillation: Secondary | ICD-10-CM

## 2011-11-20 LAB — PROTIME-INR: INR: 1.96 — ABNORMAL HIGH (ref <1.50)

## 2011-11-21 ENCOUNTER — Telehealth: Payer: Self-pay | Admitting: *Deleted

## 2011-11-21 ENCOUNTER — Encounter: Payer: Self-pay | Admitting: *Deleted

## 2011-11-21 LAB — PROTIME-INR: INR: 2 — AB (ref 0.9–1.1)

## 2011-11-21 LAB — LIPID PANEL: LDL Cholesterol: 105 mg/dL — ABNORMAL HIGH (ref 0–99)

## 2011-11-21 NOTE — Telephone Encounter (Signed)
Patient states that she had PT/INR drawn at the lab yesterday.  Wants to know if she can cancel appointment for Thursday. / tg

## 2011-11-21 NOTE — Telephone Encounter (Signed)
OK for to cancel INR appt for Thursday if [facility] did INR on 5/13.  Order was sent but results not in EPIC yet.  Told pt I would track down result and call her with instructions in the morning.

## 2011-11-22 ENCOUNTER — Ambulatory Visit: Payer: Self-pay | Admitting: *Deleted

## 2011-11-22 DIAGNOSIS — I4891 Unspecified atrial fibrillation: Secondary | ICD-10-CM

## 2011-11-22 DIAGNOSIS — Z7901 Long term (current) use of anticoagulants: Secondary | ICD-10-CM

## 2011-12-08 ENCOUNTER — Other Ambulatory Visit: Payer: Self-pay | Admitting: Cardiology

## 2011-12-26 ENCOUNTER — Other Ambulatory Visit: Payer: Self-pay | Admitting: Oncology

## 2011-12-26 DIAGNOSIS — Z1231 Encounter for screening mammogram for malignant neoplasm of breast: Secondary | ICD-10-CM

## 2011-12-27 ENCOUNTER — Ambulatory Visit (INDEPENDENT_AMBULATORY_CARE_PROVIDER_SITE_OTHER): Payer: Medicare Other | Admitting: *Deleted

## 2011-12-27 DIAGNOSIS — Z7901 Long term (current) use of anticoagulants: Secondary | ICD-10-CM

## 2011-12-27 DIAGNOSIS — I4891 Unspecified atrial fibrillation: Secondary | ICD-10-CM

## 2011-12-27 LAB — POCT INR: INR: 1.8

## 2011-12-30 ENCOUNTER — Other Ambulatory Visit: Payer: Self-pay | Admitting: Cardiology

## 2012-01-16 ENCOUNTER — Ambulatory Visit
Admission: RE | Admit: 2012-01-16 | Discharge: 2012-01-16 | Disposition: A | Discharge status: Home / Self Care | Payer: Medicare Other | Source: Ambulatory Visit | Attending: Oncology | Admitting: Oncology

## 2012-01-16 DIAGNOSIS — Z1231 Encounter for screening mammogram for malignant neoplasm of breast: Secondary | ICD-10-CM

## 2012-01-18 ENCOUNTER — Ambulatory Visit (INDEPENDENT_AMBULATORY_CARE_PROVIDER_SITE_OTHER): Payer: Medicare Other | Admitting: *Deleted

## 2012-01-18 DIAGNOSIS — Z7901 Long term (current) use of anticoagulants: Secondary | ICD-10-CM

## 2012-01-18 DIAGNOSIS — I4891 Unspecified atrial fibrillation: Secondary | ICD-10-CM

## 2012-01-18 LAB — POCT INR: INR: 2.5

## 2012-02-15 ENCOUNTER — Ambulatory Visit (INDEPENDENT_AMBULATORY_CARE_PROVIDER_SITE_OTHER): Payer: Medicare Other | Admitting: *Deleted

## 2012-02-15 DIAGNOSIS — Z7901 Long term (current) use of anticoagulants: Secondary | ICD-10-CM

## 2012-02-15 DIAGNOSIS — I4891 Unspecified atrial fibrillation: Secondary | ICD-10-CM

## 2012-03-15 ENCOUNTER — Ambulatory Visit (INDEPENDENT_AMBULATORY_CARE_PROVIDER_SITE_OTHER): Payer: Medicare Other | Admitting: Urology

## 2012-03-15 DIAGNOSIS — N3010 Interstitial cystitis (chronic) without hematuria: Secondary | ICD-10-CM

## 2012-03-15 DIAGNOSIS — R351 Nocturia: Secondary | ICD-10-CM

## 2012-03-28 ENCOUNTER — Ambulatory Visit (INDEPENDENT_AMBULATORY_CARE_PROVIDER_SITE_OTHER): Payer: Medicare Other | Admitting: *Deleted

## 2012-03-28 DIAGNOSIS — Z7901 Long term (current) use of anticoagulants: Secondary | ICD-10-CM

## 2012-03-28 DIAGNOSIS — I4891 Unspecified atrial fibrillation: Secondary | ICD-10-CM

## 2012-05-09 ENCOUNTER — Ambulatory Visit (INDEPENDENT_AMBULATORY_CARE_PROVIDER_SITE_OTHER): Payer: Medicare Other | Admitting: *Deleted

## 2012-05-09 DIAGNOSIS — Z7901 Long term (current) use of anticoagulants: Secondary | ICD-10-CM

## 2012-05-09 DIAGNOSIS — I4891 Unspecified atrial fibrillation: Secondary | ICD-10-CM

## 2012-05-09 LAB — POCT INR: INR: 3.1

## 2012-05-18 ENCOUNTER — Other Ambulatory Visit: Payer: Self-pay | Admitting: Cardiology

## 2012-06-20 ENCOUNTER — Ambulatory Visit (INDEPENDENT_AMBULATORY_CARE_PROVIDER_SITE_OTHER): Payer: Medicare Other | Admitting: *Deleted

## 2012-06-20 DIAGNOSIS — I4891 Unspecified atrial fibrillation: Secondary | ICD-10-CM

## 2012-06-20 DIAGNOSIS — Z7901 Long term (current) use of anticoagulants: Secondary | ICD-10-CM

## 2012-07-19 ENCOUNTER — Ambulatory Visit (INDEPENDENT_AMBULATORY_CARE_PROVIDER_SITE_OTHER): Payer: Medicare Other | Admitting: Urology

## 2012-07-19 DIAGNOSIS — R351 Nocturia: Secondary | ICD-10-CM

## 2012-07-19 DIAGNOSIS — R350 Frequency of micturition: Secondary | ICD-10-CM

## 2012-08-01 ENCOUNTER — Ambulatory Visit (INDEPENDENT_AMBULATORY_CARE_PROVIDER_SITE_OTHER): Payer: Medicare Other | Admitting: *Deleted

## 2012-08-01 DIAGNOSIS — Z7901 Long term (current) use of anticoagulants: Secondary | ICD-10-CM

## 2012-08-01 DIAGNOSIS — I4891 Unspecified atrial fibrillation: Secondary | ICD-10-CM

## 2012-09-12 ENCOUNTER — Ambulatory Visit (INDEPENDENT_AMBULATORY_CARE_PROVIDER_SITE_OTHER): Payer: Medicare Other | Admitting: *Deleted

## 2012-09-12 DIAGNOSIS — I4891 Unspecified atrial fibrillation: Secondary | ICD-10-CM

## 2012-09-12 DIAGNOSIS — Z7901 Long term (current) use of anticoagulants: Secondary | ICD-10-CM

## 2012-09-23 ENCOUNTER — Other Ambulatory Visit: Payer: Self-pay | Admitting: Cardiology

## 2012-09-27 ENCOUNTER — Ambulatory Visit (INDEPENDENT_AMBULATORY_CARE_PROVIDER_SITE_OTHER): Payer: Medicare Other | Admitting: Urology

## 2012-09-27 DIAGNOSIS — R351 Nocturia: Secondary | ICD-10-CM

## 2012-09-27 DIAGNOSIS — R3989 Other symptoms and signs involving the genitourinary system: Secondary | ICD-10-CM

## 2012-09-27 DIAGNOSIS — N3010 Interstitial cystitis (chronic) without hematuria: Secondary | ICD-10-CM

## 2012-10-01 ENCOUNTER — Ambulatory Visit (INDEPENDENT_AMBULATORY_CARE_PROVIDER_SITE_OTHER): Payer: Medicare Other | Admitting: Urology

## 2012-10-01 DIAGNOSIS — N3010 Interstitial cystitis (chronic) without hematuria: Secondary | ICD-10-CM

## 2012-10-15 ENCOUNTER — Ambulatory Visit (INDEPENDENT_AMBULATORY_CARE_PROVIDER_SITE_OTHER): Payer: Medicare Other | Admitting: Urology

## 2012-10-15 DIAGNOSIS — N3010 Interstitial cystitis (chronic) without hematuria: Secondary | ICD-10-CM

## 2012-10-22 ENCOUNTER — Ambulatory Visit (INDEPENDENT_AMBULATORY_CARE_PROVIDER_SITE_OTHER): Payer: Medicare Other | Admitting: Urology

## 2012-10-22 DIAGNOSIS — N3010 Interstitial cystitis (chronic) without hematuria: Secondary | ICD-10-CM

## 2012-10-23 ENCOUNTER — Encounter: Payer: Self-pay | Admitting: Adult Health

## 2012-10-23 NOTE — Progress Notes (Signed)
HPI: [name] is a 79 year old patient of Dr. Dietrich Pates we follow for ongoing management of paroxysmal atrial fibrillation. The patient was last seen in the office in March of 2013. The patient is on flecainide, and on last visit was asymptomatic, and remained in normal sinus rhythm. The patient was continued on Coumadin. She is followed in our [place] office.   Has seen Dr. Ouida Sills 2 months ago with BP elevation, of 176/ 105 which was persistent for 2 days. She was placed on Altace 2.5 mg daily. She has not been in the hospital or in the ER since being seen last. She is currently being treated for interstitial cystitis. She states her blood pressures been much better controlled with the addition of Altace, she has had some complaints of myalgias although this has been intermittent.  Allergies  Allergen Reactions  . Atorvastatin Other (See Comments)    Myalgias  . Hydrocodone Other (See Comments)    GI distress.    Current Outpatient Prescriptions  Medication Sig Dispense Refill  . ALPRAZolam (XANAX) 0.25 MG tablet Take 1 tablet (0.25 mg total) by mouth 3 (three) times daily as needed for sleep. Takes 1/2 tablet as needed  100 tablet  0  . fish oil-omega-3 fatty acids 1000 MG capsule Take 1,000 mg by mouth daily.      . flecainide (TAMBOCOR) 100 MG tablet TAKE 1 TABLET TWICE A DAY  180 tablet  3  . Glucosamine-Chondroit-Vit C-Mn (GLUCOSAMINE 1500 COMPLEX) CAPS Take 1 capsule by mouth daily.        . Multiple Vitamins-Calcium (ONE-A-DAY WOMENS PO) Take by mouth daily.        Marland Kitchen omeprazole (PRILOSEC) 40 MG capsule Take 1 capsule (40 mg total) by mouth daily.  30 capsule  11  . pravastatin (PRAVACHOL) 20 MG tablet TAKE 1 TABLET EVERY DAY  30 tablet  10  . ramipril (ALTACE) 2.5 MG tablet Take 2.5 mg by mouth daily.      . verapamil (VERELAN PM) 180 MG 24 hr capsule TAKE ONE CAPSULE BY MOUTH ONCE A DAY  90 capsule  2  . warfarin (COUMADIN) 5 MG tablet TAKE 1 TABLET (5 MG TOTAL) BY MOUTH DAILY.   45 tablet  3   No current facility-administered medications for this visit.    Past Medical History  Diagnosis Date  . Herpes zoster   . Arteriosclerotic cardiovascular disease (ASCVD)      cath Feb '07- no obstructive dz - 20% proximal LAD only; EF 65%; possible coronary artery spasm  . Hyperlipidemia     Lipid profile in 11/2008:282, 176, 64, 201  . Polio 1946    at age 36  . Peripheral neuropathy   . Anxiety   . Hypertension     diastolic dysfunction; normal CMet and TSH in 11/2009; normal CBC in 04/2010  . GERD (gastroesophageal reflux disease)     hiatal hernia  . Carcinoma of breast 2001    Left mastectomy with positive nodes  . Rectal bleed   . Chest pain     Long-standing and atypical  . Atrial fibrillation     Paroxysmal on event recorder in 2009; regular supraventricular tachycardia at a rate of 150 also identified on that study representing either atrial flutter or PSVT; onset of persistent AF in 03/2011  . Chronic anticoagulation 04/12/2011    Past Surgical History  Procedure Laterality Date  . Mastectomy      Left;modified radical with lymph node dissection  . Cholecystectomy  1999  . Colonoscopy  02/22/2011    Procedure: COLONOSCOPY;  Surgeon: Malissa Hippo, MD;  Location: [facility];  Service: Endoscopy;  Laterality: N/A;; performed for scant hematochezia  . Esophagogastroduodenoscopy  02/22/2011    Procedure: ESOPHAGOGASTRODUODENOSCOPY (EGD);  Surgeon: Malissa Hippo, MD;  Location: [facility];  Service: Endoscopy;  Laterality: N/A;    ZOX:WRUEAV of systems complete and found to be negative unless listed above  PHYSICAL EXAM BP 118/82  Pulse 64  Ht 5\' 1"  (1.549 m)  Wt 185 lb (83.915 kg)  BMI 34.97 kg/m2 General: Well developed, well nourished, in no acute distress Head: Eyes PERRLA, No xanthomas.   Normal cephalic and atramatic  Lungs: Clear bilaterally to auscultation and percussion. Heart: HRRR S1 S2,with split first heart sound,without MRG.   Pulses are 2+ & equal.            No carotid bruit. No JVD.  No abdominal bruits. No femoral bruits. Abdomen: Bowel sounds are positive, abdomen soft and non-tender without masses or                  Hernia's noted. Msk:  Back normal, normal gait. Normal strength and tone for age. Extremities: No clubbing, cyanosis or edema. Multiple varicosities  DP +1 Neuro: Alert and oriented X 3. Psych:  Good affect, responds appropriately  EKG: Normal sinus rhythm, first-degree AV block, PR interval 0.244. Heart rate 67 beats per minute.  ASSESSMENT AND PLAN

## 2012-10-24 ENCOUNTER — Ambulatory Visit (INDEPENDENT_AMBULATORY_CARE_PROVIDER_SITE_OTHER): Payer: Medicare Other | Admitting: Adult Health

## 2012-10-24 ENCOUNTER — Ambulatory Visit (INDEPENDENT_AMBULATORY_CARE_PROVIDER_SITE_OTHER): Payer: Medicare Other | Admitting: *Deleted

## 2012-10-24 ENCOUNTER — Encounter: Payer: Self-pay | Admitting: Adult Health

## 2012-10-24 VITALS — BP 118/82 | HR 64 | Ht 61.0 in | Wt 185.0 lb

## 2012-10-24 DIAGNOSIS — I10 Essential (primary) hypertension: Secondary | ICD-10-CM

## 2012-10-24 DIAGNOSIS — Z7901 Long term (current) use of anticoagulants: Secondary | ICD-10-CM

## 2012-10-24 DIAGNOSIS — I4891 Unspecified atrial fibrillation: Secondary | ICD-10-CM

## 2012-10-24 DIAGNOSIS — E785 Hyperlipidemia, unspecified: Secondary | ICD-10-CM

## 2012-10-24 MED ORDER — WARFARIN SODIUM 5 MG PO TABS: 5.0000 mg | ORAL_TABLET | ORAL | Status: DC

## 2012-10-24 NOTE — Patient Instructions (Addendum)
Your physician recommends that you schedule a follow-up appointment in: 12 months with KL

## 2012-10-24 NOTE — Assessment & Plan Note (Signed)
She remains in normal sinus rhythm with a first-degree AV block. She has not had any instances of her heart rate racing palpating or feeling abnormal. The patient continues on Coumadin. She is being seen in our Coumadin clinic today for INR check. Coumadin has been refills. She will continue on flecainide 100 mg twice a day. We will see her in one year unless she is symptomatic.

## 2012-10-24 NOTE — Assessment & Plan Note (Signed)
Blood pressure is well-controlled on this visit. She did state that over the last 3 months she has had elevation in her blood pressure which became very concerning to her. She was seen by Dr. Ouida Sills her primary care physician and begun on all takes 2.5 mg daily in addition to verapamil 180 mg daily. Blood pressure has been well-controlled since that time. She is due for followup labs in 3 days with Dr. Alonza Smoker office.

## 2012-10-24 NOTE — Progress Notes (Deleted)
Name: [name]    DOB: [birth year]  Age: 79 y.o.  MR#: [id number]       PCP:  Illene Regulus, MD      Insurance: Payor: [employer] [place] [employer]  Plan: BLUE MEDICARE  Product Type: *No Product type*    CC:    Chief Complaint  Patient presents with  . Atrial Fibrillation    VS Filed Vitals:   10/24/12 1121  BP: 118/82  Pulse: 64  Height: 5\' 1"  (1.549 m)  Weight: 185 lb (83.915 kg)    Weights Current Weight  10/24/12 185 lb (83.915 kg)  10/19/11 176 lb 3.2 oz (79.924 kg)  09/07/11 181 lb (82.101 kg)    Blood Pressure  BP Readings from Last 3 Encounters:  10/24/12 118/82  10/19/11 110/70  09/07/11 125/73     Admit date:  (Not on file) Last encounter with RMR:  Visit date not found   Allergy Atorvastatin and Hydrocodone  Current Outpatient Prescriptions  Medication Sig Dispense Refill  . ALPRAZolam (XANAX) 0.25 MG tablet Take 1 tablet (0.25 mg total) by mouth 3 (three) times daily as needed for sleep. Takes 1/2 tablet as needed  100 tablet  0  . fish oil-omega-3 fatty acids 1000 MG capsule Take 1,000 mg by mouth daily.      . flecainide (TAMBOCOR) 100 MG tablet TAKE 1 TABLET TWICE A DAY  180 tablet  3  . Glucosamine-Chondroit-Vit C-Mn (GLUCOSAMINE 1500 COMPLEX) CAPS Take 1 capsule by mouth daily.        . Multiple Vitamins-Calcium (ONE-A-DAY WOMENS PO) Take by mouth daily.        Marland Kitchen omeprazole (PRILOSEC) 40 MG capsule Take 1 capsule (40 mg total) by mouth daily.  30 capsule  11  . pravastatin (PRAVACHOL) 20 MG tablet TAKE 1 TABLET EVERY DAY  30 tablet  10  . ramipril (ALTACE) 2.5 MG tablet Take 2.5 mg by mouth daily.      . verapamil (VERELAN PM) 180 MG 24 hr capsule TAKE ONE CAPSULE BY MOUTH ONCE A DAY  90 capsule  2  . warfarin (COUMADIN) 5 MG tablet TAKE 1 TABLET (5 MG TOTAL) BY MOUTH DAILY.  45 tablet  3   No current facility-administered medications for this visit.    Discontinued Meds:    Medications Discontinued During This Encounter   Medication Reason  . warfarin (COUMADIN) 5 MG tablet Error    Patient Active Problem List  Diagnosis  . HERPES ZOSTER  . ANXIETY  . PERIPHERAL NEUROPATHY  . Arteriosclerotic cardiovascular disease (ASCVD)  . Hyperlipidemia  . Polio  . Hypertension  . GERD (gastroesophageal reflux disease)  . Carcinoma of breast  . Chest pain  . Atrial fibrillation  . Chronic anticoagulation    LABS    Component Value Date/Time   NA 138 04/05/2011 1115   NA 142 03/22/2011 1429   NA 140 11/24/2009 1128   K 4.4 04/05/2011 1115   K 4.3 03/22/2011 1429   K 4.1 11/24/2009 1128   CL 104 04/05/2011 1115   CL 103 03/22/2011 1429   CL 102 11/24/2009 1128   CO2 22 04/05/2011 1115   CO2 24 03/22/2011 1429   CO2 28 11/24/2009 1128   GLUCOSE 82 04/05/2011 1115   GLUCOSE 104* 03/22/2011 1429   GLUCOSE 98 11/24/2009 1128   BUN 14 04/05/2011 1115   BUN 11 03/22/2011 1429   BUN 17 11/24/2009 1128   CREATININE 0.80 04/05/2011 1115  CREATININE 0.75 03/22/2011 1429   CREATININE 0.80 11/24/2009 1128   CREATININE 0.79 11/16/2008 1343   CREATININE 0.9 11/12/2008 1508   CALCIUM 9.1 04/05/2011 1115   CALCIUM 9.3 03/22/2011 1429   CALCIUM 9.5 11/24/2009 1128   GFRNONAA 64.90 11/12/2008 1508   GFRNONAA >60 01/26/2008 0820   GFRNONAA 87 09/24/2006 1439   GFRAA  Value: >60        The eGFR has been calculated using the MDRD equation. This calculation has not been validated in all clinical 01/26/2008 0820   GFRAA 106 09/24/2006 1439   CMP     Component Value Date/Time   NA 138 04/05/2011 1115   K 4.4 04/05/2011 1115   CL 104 04/05/2011 1115   CO2 22 04/05/2011 1115   GLUCOSE 82 04/05/2011 1115   BUN 14 04/05/2011 1115   CREATININE 0.80 04/05/2011 1115   CREATININE 0.80 11/24/2009 1128   CALCIUM 9.1 04/05/2011 1115   PROT 6.6 03/22/2011 1429   ALBUMIN 3.9 03/22/2011 1429   AST 17 03/22/2011 1429   ALT 8 03/22/2011 1429   ALKPHOS 90 03/22/2011 1429   BILITOT 0.8 03/22/2011 1429   GFRNONAA 64.90 11/12/2008 1508   GFRAA  Value: >60         The eGFR has been calculated using the MDRD equation. This calculation has not been validated in all clinical 01/26/2008 0820       Component Value Date/Time   WBC 6.8 05/08/2011 1450   WBC 6.7 04/05/2011 1115   WBC 6.7 03/22/2011 1429   WBC 5.7 11/24/2009 1128   WBC 6.3 11/16/2008 1343   WBC 7.5 10/01/2007 1517   HGB 14.2 05/08/2011 1450   HGB 12.2 04/05/2011 1115   HGB 12.9 03/22/2011 1429   HGB 14.3 11/24/2009 1128   HGB 13.6 11/16/2008 1343   HGB 14.3 10/01/2007 1517   HCT 45.7 05/08/2011 1450   HCT 40.4 04/05/2011 1115   HCT 42.2 03/22/2011 1429   HCT 43.5 11/24/2009 1128   HCT 39.8 11/16/2008 1343   HCT 41.0 10/01/2007 1517   MCV 84.0 05/08/2011 1450   MCV 85.6 04/05/2011 1115   MCV 86.3 03/22/2011 1429   MCV 87.3 11/24/2009 1128   MCV 85.7 11/16/2008 1343   MCV 85.3 10/01/2007 1517    Lipid Panel     Component Value Date/Time   CHOL 189 11/20/2011 0820   TRIG 113 11/20/2011 0820   HDL 61 11/20/2011 0820   CHOLHDL 3.1 11/20/2011 0820   VLDL 23 11/20/2011 0820   LDLCALC 105* 11/20/2011 0820    ABG    Component Value Date/Time   TCO2 29 01/25/2008 1854     Lab Results  Component Value Date   TSH 4.225 03/22/2011   BNP (last 3 results) No results found for this basename: PROBNP,  in the last 8760 hours Cardiac Panel (last 3 results) No results found for this basename: CKTOTAL, CKMB, TROPONINI, RELINDX,  in the last 72 hours  Iron/TIBC/Ferritin    Component Value Date/Time   IRON 190* 05/08/2011 1450   TIBC 409 05/08/2011 1450     EKG Orders placed in visit on 10/24/12  . EKG 12-LEAD     Prior Assessment and Plan Problem List as of 10/24/2012     ICD-9-CM   HERPES ZOSTER   ANXIETY   Last Assessment & Plan   09/07/2011 Office Visit Written 09/07/2011  4:02 PM by Kathlen Brunswick, MD     Xanax renewed at patient's request.  100 tablets of 0.25 mg provided.  She typically utilizes a minimal 0.125 mg dose, , but notes that this significantly improves her sense of shakiness  and dyspnea.    PERIPHERAL NEUROPATHY   Arteriosclerotic cardiovascular disease (ASCVD)   Last Assessment & Plan   09/07/2011 Office Visit Edited 09/10/2011  9:22 AM by Kathlen Brunswick, MD     Patient had minimal coronary disease when assessed 6 years ago.  Based upon symptoms there is no reason to suspect  that she has had progression.    Hyperlipidemia   Last Assessment & Plan   09/07/2011 Office Visit Edited 09/10/2011  9:24 AM by Kathlen Brunswick, MD     Patient developed GI distress with red yeast rice and has discontinued that supplement.  Pharmacologic therapy of moderate hyperlipidemia is appropriate if we consider her mild coronary disease to indicate the presence of atherosclerosis.  I am inclined to do so.  She suffered myalgias with moderate dose atorvastatin.  We will start very low-dose pravastatin to determine if she can tolerate any statin.    Polio   Hypertension   Last Assessment & Plan   09/07/2011 Office Visit Written 09/07/2011  4:06 PM by Kathlen Brunswick, MD     Blood pressure control is excellent, both when checked in office and by the patient during her usual daily activities.  Current medication will be continued.    GERD (gastroesophageal reflux disease)   Carcinoma of breast   Chest pain   Last Assessment & Plan   09/07/2011 Office Visit Written 09/07/2011  4:03 PM by Kathlen Brunswick, MD     No recent chest discomfort noted.    Atrial fibrillation   Last Assessment & Plan   05/08/2011 Office Visit Edited 05/10/2011  4:03 PM by Kathlen Brunswick, MD     Recent history does not indicate another episode of atrial fibrillation or supraventricular tachycardia.  Although recurrent AF is likely to occur in the future, the time course is uncertain.  We will maintain anticoagulation for the present.    It is unclear whether cardioversion resulted in significant symptomatic improvement.  In any case, the patient feels fine today while in sinus bradycardia.  Current  medications will be continued except as noted above.  A flecainide level will be obtained.    Chronic anticoagulation   Last Assessment & Plan   09/07/2011 Office Visit Written 09/07/2011  4:04 PM by Kathlen Brunswick, MD     Patient switched from dabigatran to warfarin due to cost.  She is doing well on the latter agent.  We will continue to monitor stool Hemoccults and CBCs to exclude occult GI blood loss.  Due to a family history of premature colon cancer, she undergoes colonoscopy every 5 years, most recently 6 months ago at which time no biopsies or polypectomies were required.        Imaging: No results found.

## 2012-10-24 NOTE — Assessment & Plan Note (Signed)
She is complaining of mild muscle aches and pains. She believes is related to pravastatin. I have asked her to stop pravastatin for one week to see if her symptoms had subsided. If they did not think she is assured that her-medications is not contributing to these symptoms, and she is to restart the medication. If she does have relief of symptoms she is to call us so that we may adjust her medications.

## 2012-10-29 ENCOUNTER — Encounter: Payer: Self-pay | Admitting: Orthopedic Surgery

## 2012-10-29 ENCOUNTER — Ambulatory Visit (INDEPENDENT_AMBULATORY_CARE_PROVIDER_SITE_OTHER): Payer: Medicare Other | Admitting: Orthopedic Surgery

## 2012-10-29 ENCOUNTER — Ambulatory Visit (HOSPITAL_COMMUNITY)
Admission: RE | Admit: 2012-10-29 | Discharge: 2012-10-29 | Disposition: A | Discharge status: Home / Self Care | Payer: Medicare Other | Source: Ambulatory Visit | Attending: Internal Medicine | Admitting: Internal Medicine

## 2012-10-29 ENCOUNTER — Other Ambulatory Visit (HOSPITAL_COMMUNITY): Payer: Self-pay | Admitting: Internal Medicine

## 2012-10-29 VITALS — BP 124/70 | Ht 61.5 in | Wt 188.0 lb

## 2012-10-29 DIAGNOSIS — S6990XA Unspecified injury of unspecified wrist, hand and finger(s), initial encounter: Secondary | ICD-10-CM | POA: Insufficient documentation

## 2012-10-29 DIAGNOSIS — W19XXXA Unspecified fall, initial encounter: Secondary | ICD-10-CM | POA: Insufficient documentation

## 2012-10-29 DIAGNOSIS — S63509A Unspecified sprain of unspecified wrist, initial encounter: Secondary | ICD-10-CM

## 2012-10-29 DIAGNOSIS — S62113A Displaced fracture of triquetrum [cuneiform] bone, unspecified wrist, initial encounter for closed fracture: Secondary | ICD-10-CM

## 2012-10-29 DIAGNOSIS — M25539 Pain in unspecified wrist: Secondary | ICD-10-CM | POA: Insufficient documentation

## 2012-10-29 DIAGNOSIS — S59909A Unspecified injury of unspecified elbow, initial encounter: Secondary | ICD-10-CM | POA: Insufficient documentation

## 2012-10-29 DIAGNOSIS — M25531 Pain in right wrist: Secondary | ICD-10-CM

## 2012-10-29 DIAGNOSIS — S63501A Unspecified sprain of right wrist, initial encounter: Secondary | ICD-10-CM | POA: Insufficient documentation

## 2012-10-29 DIAGNOSIS — S62111A Displaced fracture of triquetrum [cuneiform] bone, right wrist, initial encounter for closed fracture: Secondary | ICD-10-CM

## 2012-10-29 DIAGNOSIS — R937 Abnormal findings on diagnostic imaging of other parts of musculoskeletal system: Secondary | ICD-10-CM | POA: Insufficient documentation

## 2012-10-29 MED ORDER — ACETAMINOPHEN-CODEINE #3 300-30 MG PO TABS: 1.0000 | ORAL_TABLET | ORAL | Status: DC | PRN

## 2012-10-29 NOTE — Progress Notes (Signed)
Patient ID: [name], female   DOB: August 28, 1931, 79 y.o.   MRN: [id number] Chief Complaint  Patient presents with  . Wrist Pain    Right wrist pain d/t fracture DOI 10/28/12 Referred by Dr. Ouida Daniel    History the patient fell on April 21 landed on her right wrist she went to her primary care physician he obtained an x-ray and the x-ray showed a triquetral fracture with soft tissue swelling around the right wrist joint she placed herself in a splint  She complains of dull 7/10 constant pain with swelling and decreased range of motion. She denies numbness or tingling excessive urination is noted and she has frequency and painful urination with heartburn heart palpitations and watering of the eyes otherwise her review of systems is negative  She has reaction to hydrocodone with extreme nausea  She has a history of cystitis history of heart palpitations  She's had cancer left breast with surgery. [facility]  Carylon Perches M.D.  Family history: Heart disease cancer asthma and diabetes  Social history widowed, retired, no smoking no drinking no street drugs no caffeine use educational level completed high school diploma  BP 124/70  Ht 5' 1.5" (1.562 m)  Wt 188 lb (85.276 kg)  BMI 34.95 kg/m2 The patient is normally developed normal nutrition medium to large body habitus C. BMI no deformities well-groomed  Peripheral vascular system pulses and temperature are normal without edema or tenderness  Lymphadenopathy negative  Ambulation normal  Skin shows subcutaneous hemorrhage probably from Coumadin therapy  No evidence of rashes lesions major caf au lait spots or ulcerations in her extremities  Motor coordination is normal while she is ambulatory. No pathologic reflexes. Sensation is normal. She is oriented x3 her mood and affect are normal  Her lower extremities are without contracture subluxation atrophy tremor or swelling  The left upper extremity shows no misalignment,  contracture, instability dislocation subluxation, loss of muscle tone.  The right upper extremity is tender over the wrist joint and the ulnar as well as the hand near the triquetral bone. There is swelling decreased range of motion at the joint remain stable muscle tone remains normal skin remains intact  X-rays show triquetral fracture with soft tissue swelling  Impression Triquetral chip fracture, right, closed, initial encounter - Plan: acetaminophen-codeine (TYLENOL #3) 300-30 MG per tablet  Triquetral fracture, right, closed, initial encounter  Sprain of right wrist, initial encounter   Recommend splinting for 6 weeks Codeine for pain Call if any problems X-ray in 6 weeks

## 2012-10-29 NOTE — Patient Instructions (Signed)
Brace except bathing   Pain medication as needed

## 2012-11-05 ENCOUNTER — Encounter: Payer: Self-pay | Admitting: Adult Health

## 2012-11-05 ENCOUNTER — Ambulatory Visit (INDEPENDENT_AMBULATORY_CARE_PROVIDER_SITE_OTHER): Payer: Medicare Other | Admitting: Urology

## 2012-11-05 DIAGNOSIS — N3010 Interstitial cystitis (chronic) without hematuria: Secondary | ICD-10-CM

## 2012-11-21 ENCOUNTER — Ambulatory Visit (INDEPENDENT_AMBULATORY_CARE_PROVIDER_SITE_OTHER): Payer: Medicare Other | Admitting: *Deleted

## 2012-11-21 DIAGNOSIS — I4891 Unspecified atrial fibrillation: Secondary | ICD-10-CM

## 2012-11-21 DIAGNOSIS — Z7901 Long term (current) use of anticoagulants: Secondary | ICD-10-CM

## 2012-12-06 ENCOUNTER — Ambulatory Visit (INDEPENDENT_AMBULATORY_CARE_PROVIDER_SITE_OTHER): Payer: Medicare Other | Admitting: Urology

## 2012-12-06 DIAGNOSIS — N3010 Interstitial cystitis (chronic) without hematuria: Secondary | ICD-10-CM

## 2012-12-06 DIAGNOSIS — R351 Nocturia: Secondary | ICD-10-CM

## 2012-12-10 ENCOUNTER — Ambulatory Visit (INDEPENDENT_AMBULATORY_CARE_PROVIDER_SITE_OTHER): Payer: Medicare Other | Admitting: Orthopedic Surgery

## 2012-12-10 ENCOUNTER — Encounter: Payer: Self-pay | Admitting: Orthopedic Surgery

## 2012-12-10 ENCOUNTER — Ambulatory Visit (INDEPENDENT_AMBULATORY_CARE_PROVIDER_SITE_OTHER): Payer: Medicare Other

## 2012-12-10 VITALS — BP 102/76 | Ht 61.5 in | Wt 188.0 lb

## 2012-12-10 DIAGNOSIS — S62109A Fracture of unspecified carpal bone, unspecified wrist, initial encounter for closed fracture: Secondary | ICD-10-CM | POA: Insufficient documentation

## 2012-12-10 DIAGNOSIS — IMO0001 Reserved for inherently not codable concepts without codable children: Secondary | ICD-10-CM

## 2012-12-10 DIAGNOSIS — S62101D Fracture of unspecified carpal bone, right wrist, subsequent encounter for fracture with routine healing: Secondary | ICD-10-CM

## 2012-12-10 NOTE — Progress Notes (Signed)
Patient ID: [name], female   DOB: 1931/07/31, 79 y.o.   MRN: [id number] Chief Complaint  Patient presents with  . Follow-up    Right wrist fracture DOI 10/28/12    TRIQUETRAL fracture f/u xrays   No complaints   xrays normal   Exam normal   Discharged

## 2012-12-10 NOTE — Patient Instructions (Signed)
activities as tolerated 

## 2012-12-15 NOTE — Progress Notes (Signed)
HPI: [name] is a 79 year old patient of Dr. Dietrich Pates we are following for ongoing management of paroxysmal atrial fibrillation. Patient was last seen in the office in April 2014. At that visit she remained in normal sinus rhythm and continued on Coumadin therapy. Also flecainide twice a day. Blood pressure was well-controlled. She does complain of some myalgias which she felt was related to the pravastatin. I have asked her to stop it for a week to see if this was causing her symptoms.   Today the patient is here without complaints of muscle aches and pains. She is noticing that her heart rate is occasionally irregular. EKG here in the office does show atrial fibrillation with rate of 69 beats per minute. With a right bundle branch block. She states that she sometimes feel a tightening in her neck when her heart rate is irregular but this usually goes away later in the day. She denies her heart racing or causing her shortness of breath or chest pain. She states she is currently under a lot of pressure with the sale of her house and trying to clean it out and closed it within the next 2 weeks. She is however excited about going on a cruise in July. She is medically compliant and denies any bleeding issues.        Allergies  Allergen Reactions  . Atorvastatin Other (See Comments)    Myalgias  . Hydrocodone Other (See Comments)    GI distress.    Current Outpatient Prescriptions  Medication Sig Dispense Refill  . ALPRAZolam (XANAX) 0.25 MG tablet Take 1 tablet (0.25 mg total) by mouth 3 (three) times daily as needed for sleep. Takes 1/2 tablet as needed  100 tablet  0  . flecainide (TAMBOCOR) 100 MG tablet TAKE 1 TABLET TWICE A DAY  180 tablet  3  . Glucosamine-Chondroit-Vit C-Mn (GLUCOSAMINE 1500 COMPLEX) CAPS Take 1 capsule by mouth daily.        . Multiple Vitamins-Calcium (ONE-A-DAY WOMENS PO) Take by mouth daily.        . pravastatin (PRAVACHOL) 20 MG tablet TAKE 1 TABLET EVERY DAY  30  tablet  10  . ramipril (ALTACE) 2.5 MG tablet Take 2.5 mg by mouth daily.      . verapamil (VERELAN PM) 180 MG 24 hr capsule TAKE ONE CAPSULE BY MOUTH ONCE A DAY  90 capsule  2  . warfarin (COUMADIN) 5 MG tablet Take 1 tablet (5 mg total) by mouth as directed.  45 tablet  3  . omeprazole (PRILOSEC) 40 MG capsule Take 1 capsule (40 mg total) by mouth daily.  30 capsule  11   No current facility-administered medications for this visit.    Past Medical History  Diagnosis Date  . Herpes zoster   . Arteriosclerotic cardiovascular disease (ASCVD)      cath Feb '07- no obstructive dz - 20% proximal LAD only; EF 65%; possible coronary artery spasm  . Hyperlipidemia     Lipid profile in 11/2008:282, 176, 64, 201  . Polio 1946    at age 63  . Peripheral neuropathy   . Anxiety   . Hypertension     diastolic dysfunction; normal CMet and TSH in 11/2009; normal CBC in 04/2010  . GERD (gastroesophageal reflux disease)     hiatal hernia  . Carcinoma of breast 2001    Left mastectomy with positive nodes  . Rectal bleed   . Chest pain     Long-standing and atypical  .  Atrial fibrillation     Paroxysmal on event recorder in 2009; regular supraventricular tachycardia at a rate of 150 also identified on that study representing either atrial flutter or PSVT; onset of persistent AF in 03/2011  . Chronic anticoagulation 04/12/2011    Past Surgical History  Procedure Laterality Date  . Mastectomy      Left;modified radical with lymph node dissection  . Cholecystectomy  1999  . Colonoscopy  02/22/2011    Procedure: COLONOSCOPY;  Surgeon: Malissa Hippo, MD;  Location: [facility];  Service: Endoscopy;  Laterality: N/A;; performed for scant hematochezia  . Esophagogastroduodenoscopy  02/22/2011    Procedure: ESOPHAGOGASTRODUODENOSCOPY (EGD);  Surgeon: Malissa Hippo, MD;  Location: [facility];  Service: Endoscopy;  Laterality: N/A;    WUJ:WJXBJY of systems complete and found to be negative unless  listed above   PHYSICAL EXAM BP 110/78  Pulse 69  Resp 18  Ht 5\' 1"  (1.549 m)  Wt 185 lb 4 oz (84.029 kg)  BMI 35.02 kg/m2  General: Well developed, well nourished, in no acute distress Head: Eyes PERRLA, No xanthomas.   Normal cephalic and atramatic  Lungs: Clear bilaterally to auscultation and percussion. Heart: HRIR S1 S2, without MRG.  Pulses are 2+ & equal.            No carotid bruit. No JVD.  No abdominal bruits. No femoral bruits. Abdomen: Bowel sounds are positive, abdomen soft and non-tender without masses or                  Hernia's noted. Msk:  Back normal, normal gait. Normal strength and tone for age. Extremities: No clubbing, cyanosis or edema.  DP +1 Neuro: Alert and oriented X 3. Psych:  Good affect, responds appropriately  EKG: Atrial fibrillation, rate of 69 bpm. RBBB  ASSESSMENT AND PLAN

## 2012-12-16 ENCOUNTER — Encounter: Payer: Self-pay | Admitting: Adult Health

## 2012-12-16 ENCOUNTER — Ambulatory Visit (INDEPENDENT_AMBULATORY_CARE_PROVIDER_SITE_OTHER): Payer: Medicare Other | Admitting: Adult Health

## 2012-12-16 VITALS — BP 110/78 | HR 69 | Resp 18 | Ht 61.0 in | Wt 185.2 lb

## 2012-12-16 DIAGNOSIS — Z7901 Long term (current) use of anticoagulants: Secondary | ICD-10-CM

## 2012-12-16 DIAGNOSIS — I10 Essential (primary) hypertension: Secondary | ICD-10-CM

## 2012-12-16 DIAGNOSIS — I2510 Atherosclerotic heart disease of native coronary artery without angina pectoris: Secondary | ICD-10-CM

## 2012-12-16 DIAGNOSIS — I7090 Unspecified atherosclerosis: Secondary | ICD-10-CM

## 2012-12-16 DIAGNOSIS — I4891 Unspecified atrial fibrillation: Secondary | ICD-10-CM

## 2012-12-16 NOTE — Assessment & Plan Note (Signed)
Excellent control of blood pressure her current medication regimen at 110/78. Would continue her on Altase and verapamil. No changes to her medication.

## 2012-12-16 NOTE — Patient Instructions (Addendum)
Your physician recommends that you schedule a follow-up appointment in: 3 months with Joni Reining, NP.

## 2012-12-16 NOTE — Assessment & Plan Note (Signed)
If the patient has persistent pressure in her chest or neck pain may need to consider repeating stress Myoview. She has had a catheterization in 2007 with nonobstructive disease, however was found to have a 20% proximal LAD. She had questionable coronary artery spasm. I have advised patient if she has recurrent pressure in her neck or pressure in her chest she needs to call us. He may need to consider repeating stress Myoview or even cardiac catheterization.

## 2012-12-16 NOTE — Assessment & Plan Note (Signed)
She is currently in atrial fibrillation rate control at 69 beats per minute. We will not make any changes on her medication regimen she will continue the flecainide 100 mg twice a day along with a rapid female 180 mg daily. QT interval 0.458 ms. She states that this feeling usually settles out by the afternoon, but she is also inconsistent taking her flecainide. Sometimes she takes it early in the morning sometimes she takes it late morning. I have advised her to take this as directed at the same time every day. See her again in 3 months unless she becomes symptomatic.

## 2012-12-16 NOTE — Assessment & Plan Note (Signed)
She continues our Coumadin clinic as medically compliant

## 2012-12-26 ENCOUNTER — Ambulatory Visit (INDEPENDENT_AMBULATORY_CARE_PROVIDER_SITE_OTHER): Payer: Medicare Other | Admitting: *Deleted

## 2012-12-26 DIAGNOSIS — I4891 Unspecified atrial fibrillation: Secondary | ICD-10-CM

## 2012-12-26 DIAGNOSIS — Z7901 Long term (current) use of anticoagulants: Secondary | ICD-10-CM

## 2012-12-26 LAB — POCT INR: INR: 2.9

## 2013-02-06 ENCOUNTER — Encounter: Payer: Self-pay | Admitting: *Deleted

## 2013-02-06 ENCOUNTER — Encounter: Payer: Self-pay | Admitting: Adult Health

## 2013-02-06 ENCOUNTER — Ambulatory Visit (INDEPENDENT_AMBULATORY_CARE_PROVIDER_SITE_OTHER): Payer: Medicare Other | Admitting: *Deleted

## 2013-02-06 ENCOUNTER — Ambulatory Visit (INDEPENDENT_AMBULATORY_CARE_PROVIDER_SITE_OTHER): Payer: Medicare Other | Admitting: Adult Health

## 2013-02-06 VITALS — BP 120/76 | HR 65 | Ht 61.5 in | Wt 193.4 lb

## 2013-02-06 DIAGNOSIS — I4891 Unspecified atrial fibrillation: Secondary | ICD-10-CM

## 2013-02-06 DIAGNOSIS — G609 Hereditary and idiopathic neuropathy, unspecified: Secondary | ICD-10-CM

## 2013-02-06 DIAGNOSIS — I2510 Atherosclerotic heart disease of native coronary artery without angina pectoris: Secondary | ICD-10-CM

## 2013-02-06 DIAGNOSIS — Z7901 Long term (current) use of anticoagulants: Secondary | ICD-10-CM

## 2013-02-06 DIAGNOSIS — I7090 Unspecified atherosclerosis: Secondary | ICD-10-CM

## 2013-02-06 DIAGNOSIS — I10 Essential (primary) hypertension: Secondary | ICD-10-CM

## 2013-02-06 DIAGNOSIS — F411 Generalized anxiety disorder: Secondary | ICD-10-CM

## 2013-02-06 LAB — CBC
MCV: 80.3 fL (ref 78.0–100.0)
Platelets: 321 10*3/uL (ref 150–400)
RDW: 15.6 % — ABNORMAL HIGH (ref 11.5–15.5)
WBC: 7 10*3/uL (ref 4.0–10.5)

## 2013-02-06 LAB — POCT INR: INR: 2.9

## 2013-02-06 NOTE — Assessment & Plan Note (Signed)
Heart rate is well controlled currently and she has no complaints of rapid HR or palpitations. Only increased fatigue. Echo is being ordered.She will continue on coumadin with dosing per Coumadin clinic

## 2013-02-06 NOTE — Assessment & Plan Note (Signed)
She is having worsening symptoms of DOE and throat tightness. She had a cardiac cath in 2007 with 20% LAD disease. She remains on flecainide. Uncertain if these symptoms are related to progressive CAD. She is reluctant to proceed with cardiac cath, but is willing to have Lexiscan stress myoview. She may need need to be taken off of flecainide if she is having progressive CAD. Echo will be ordered along with BMET, Pro-BNP and CBC. Will see her in one week.

## 2013-02-06 NOTE — Assessment & Plan Note (Signed)
Uncertain if this is playing a role in her symptoms. She is quiet anxious about her state of health. Will do further testing for re-evaluation of her current status.

## 2013-02-06 NOTE — Progress Notes (Signed)
Name: [name]    DOB: 06-Mar-1932  Age: 79 y.o.  MR#: [id number]       PCP:  Carylon Perches, MD      Insurance: Payor: [employer] / Plan: BLUE MEDICARE / Product Type: *No Product type* /   CC:    Chief Complaint  Patient presents with  . Atrial Fibrillation  . Coronary Artery Disease    VS Filed Vitals:   02/06/13 1122  BP: 120/76  Pulse: 65  Height: 5' 1.5" (1.562 m)  Weight: 193 lb 6.4 oz (87.726 kg)    Weights Current Weight  02/06/13 193 lb 6.4 oz (87.726 kg)  12/16/12 185 lb 4 oz (84.029 kg)  12/10/12 188 lb (85.276 kg)    Blood Pressure  BP Readings from Last 3 Encounters:  02/06/13 120/76  12/16/12 110/78  12/10/12 102/76     Admit date:  (Not on file) Last encounter with RMR:  12/16/2012   Allergy Atorvastatin and Hydrocodone  Current Outpatient Prescriptions  Medication Sig Dispense Refill  . ALPRAZolam (XANAX) 0.25 MG tablet Take 1 tablet (0.25 mg total) by mouth 3 (three) times daily as needed for sleep. Takes 1/2 tablet as needed  100 tablet  0  . flecainide (TAMBOCOR) 100 MG tablet       . pravastatin (PRAVACHOL) 20 MG tablet TAKE 1 TABLET EVERY DAY  30 tablet  10  . ramipril (ALTACE) 2.5 MG tablet Take 2.5 mg by mouth daily.      . verapamil (VERELAN PM) 180 MG 24 hr capsule Take 180 mg by mouth 2 (two) times daily.       Marland Kitchen warfarin (COUMADIN) 5 MG tablet Take 1 tablet (5 mg total) by mouth as directed.  45 tablet  3  . omeprazole (PRILOSEC) 40 MG capsule Take 1 capsule (40 mg total) by mouth daily.  30 capsule  11   No current facility-administered medications for this visit.    Discontinued Meds:    Medications Discontinued During This Encounter  Medication Reason  . Multiple Vitamins-Calcium (ONE-A-DAY WOMENS PO) Error  . Glucosamine-Chondroit-Vit C-Mn (GLUCOSAMINE 1500 COMPLEX) CAPS Error  . flecainide (TAMBOCOR) 100 MG tablet     Patient Active Problem List   Diagnosis Date Noted  . Wrist fracture, closed  12/10/2012  . Triquetral fracture 10/29/2012  . Sprain of right wrist 10/29/2012  . Chronic anticoagulation 04/12/2011  . Atrial fibrillation 03/22/2011  . Arteriosclerotic cardiovascular disease (ASCVD)   . Hyperlipidemia   . Polio   . Hypertension   . GERD (gastroesophageal reflux disease)   . Carcinoma of breast   . HERPES ZOSTER 11/12/2008  . ANXIETY 07/24/2007  . PERIPHERAL NEUROPATHY 07/24/2007    LABS    Component Value Date/Time   NA 138 04/05/2011 1115   NA 142 03/22/2011 1429   NA 140 11/24/2009 1128   K 4.4 04/05/2011 1115   K 4.3 03/22/2011 1429   K 4.1 11/24/2009 1128   CL 104 04/05/2011 1115   CL 103 03/22/2011 1429   CL 102 11/24/2009 1128   CO2 22 04/05/2011 1115   CO2 24 03/22/2011 1429   CO2 28 11/24/2009 1128   GLUCOSE 82 04/05/2011 1115   GLUCOSE 104* 03/22/2011 1429   GLUCOSE 98 11/24/2009 1128   BUN 14 04/05/2011 1115   BUN 11 03/22/2011 1429   BUN 17 11/24/2009 1128   CREATININE 0.80 04/05/2011 1115   CREATININE 0.75 03/22/2011 1429   CREATININE 0.80 11/24/2009  1128   CREATININE 0.79 11/16/2008 1343   CREATININE 0.9 11/12/2008 1508   CALCIUM 9.1 04/05/2011 1115   CALCIUM 9.3 03/22/2011 1429   CALCIUM 9.5 11/24/2009 1128   GFRNONAA 64.90 11/12/2008 1508   GFRNONAA >60 01/26/2008 0820   GFRNONAA 87 09/24/2006 1439   GFRAA  Value: >60        The eGFR has been calculated using the MDRD equation. This calculation has not been validated in all clinical 01/26/2008 0820   GFRAA 106 09/24/2006 1439   CMP     Component Value Date/Time   NA 138 04/05/2011 1115   K 4.4 04/05/2011 1115   CL 104 04/05/2011 1115   CO2 22 04/05/2011 1115   GLUCOSE 82 04/05/2011 1115   BUN 14 04/05/2011 1115   CREATININE 0.80 04/05/2011 1115   CREATININE 0.80 11/24/2009 1128   CALCIUM 9.1 04/05/2011 1115   PROT 6.6 03/22/2011 1429   ALBUMIN 3.9 03/22/2011 1429   AST 17 03/22/2011 1429   ALT 8 03/22/2011 1429   ALKPHOS 90 03/22/2011 1429   BILITOT 0.8 03/22/2011 1429   GFRNONAA 64.90 11/12/2008 1508   GFRAA   Value: >60        The eGFR has been calculated using the MDRD equation. This calculation has not been validated in all clinical 01/26/2008 0820       Component Value Date/Time   WBC 6.8 05/08/2011 1450   WBC 6.7 04/05/2011 1115   WBC 6.7 03/22/2011 1429   WBC 5.7 11/24/2009 1128   WBC 6.3 11/16/2008 1343   WBC 7.5 10/01/2007 1517   HGB 14.2 05/08/2011 1450   HGB 12.2 04/05/2011 1115   HGB 12.9 03/22/2011 1429   HGB 14.3 11/24/2009 1128   HGB 13.6 11/16/2008 1343   HGB 14.3 10/01/2007 1517   HCT 45.7 05/08/2011 1450   HCT 40.4 04/05/2011 1115   HCT 42.2 03/22/2011 1429   HCT 43.5 11/24/2009 1128   HCT 39.8 11/16/2008 1343   HCT 41.0 10/01/2007 1517   MCV 84.0 05/08/2011 1450   MCV 85.6 04/05/2011 1115   MCV 86.3 03/22/2011 1429   MCV 87.3 11/24/2009 1128   MCV 85.7 11/16/2008 1343   MCV 85.3 10/01/2007 1517    Lipid Panel     Component Value Date/Time   CHOL 189 11/20/2011 0820   TRIG 113 11/20/2011 0820   HDL 61 11/20/2011 0820   CHOLHDL 3.1 11/20/2011 0820   VLDL 23 11/20/2011 0820   LDLCALC 105* 11/20/2011 0820    ABG    Component Value Date/Time   TCO2 29 01/25/2008 1854     Lab Results  Component Value Date   TSH 4.225 03/22/2011   BNP (last 3 results) No results found for this basename: PROBNP,  in the last 8760 hours Cardiac Panel (last 3 results) No results found for this basename: CKTOTAL, CKMB, TROPONINI, RELINDX,  in the last 72 hours  Iron/TIBC/Ferritin    Component Value Date/Time   IRON 190* 05/08/2011 1450   TIBC 409 05/08/2011 1450     EKG Orders placed in visit on 12/16/12  . EKG 12-LEAD     Prior Assessment and Plan Problem List as of 02/06/2013   HERPES ZOSTER   ANXIETY   Last Assessment & Plan   09/07/2011 Office Visit Written 09/07/2011  4:02 PM by Kathlen Brunswick, MD     Xanax renewed at patient's request.  100 tablets of 0.25 mg provided.  She typically utilizes a minimal 0.125 mg  dose, , but notes that this significantly improves her sense of  shakiness and dyspnea.    PERIPHERAL NEUROPATHY   Arteriosclerotic cardiovascular disease (ASCVD)   Last Assessment & Plan   12/16/2012 Office Visit Written 12/16/2012 12:01 PM by Jodelle Gross, NP     If the patient has persistent pressure in her chest or neck pain may need to consider repeating stress Myoview. She has had a catheterization in 2007 with nonobstructive disease, however was found to have a 20% proximal LAD. She had questionable coronary artery spasm. I have advised patient if she has recurrent pressure in her neck or pressure in her chest she needs to call us. He may need to consider repeating stress Myoview or even cardiac catheterization.    Hyperlipidemia   Last Assessment & Plan   10/24/2012 Office Visit Written 10/24/2012 11:50 AM by Jodelle Gross, NP     She is complaining of mild muscle aches and pains. She believes is related to pravastatin. I have asked her to stop pravastatin for one week to see if her symptoms had subsided. If they did not think she is assured that her-medications is not contributing to these symptoms, and she is to restart the medication. If she does have relief of symptoms she is to call us so that we may adjust her medications.    Polio   Hypertension   Last Assessment & Plan   12/16/2012 Office Visit Written 12/16/2012 12:02 PM by Jodelle Gross, NP     Excellent control of blood pressure her current medication regimen at 110/78. Would continue her on Altase and verapamil. No changes to her medication.    GERD (gastroesophageal reflux disease)   Carcinoma of breast   Atrial fibrillation   Last Assessment & Plan   12/16/2012 Office Visit Written 12/16/2012 12:00 PM by Jodelle Gross, NP     She is currently in atrial fibrillation rate control at 69 beats per minute. We will not make any changes on her medication regimen she will continue the flecainide 100 mg twice a day along with a rapid female 180 mg daily. QT interval 0.458 ms. She states  that this feeling usually settles out by the afternoon, but she is also inconsistent taking her flecainide. Sometimes she takes it early in the morning sometimes she takes it late morning. I have advised her to take this as directed at the same time every day. See her again in 3 months unless she becomes symptomatic.    Chronic anticoagulation   Last Assessment & Plan   12/16/2012 Office Visit Written 12/16/2012 12:02 PM by Jodelle Gross, NP     She continues our Coumadin clinic as medically compliant    Triquetral fracture   Sprain of right wrist   Wrist fracture, closed       Imaging: No results found.

## 2013-02-06 NOTE — Progress Notes (Signed)
HPI: [name] is a 79 year old patient of Dr. Dietrich Pates we are following for ongoing assessment and management of paroxysmal atrial fibrillation the patient has been placed on flecainide twice a day. She is continued on Coumadin therapy. Last visit on 12/16/2012 the patient remained in atrial fibrillation. Medication changes were made concerning her flecainide dosing. Her QT interval was 0.458 ms. She was inconsistently taking her flecainide and she was advised to take it at the same time every day in the morning and in the evening.  She had some inconsistent chest discomfort with last cardiac catheterization in 2007 with nonobstructive disease. He is found to have 20% proximal LAD. She had a questionable coronary artery spasm.   She comes today feeling very tired with DOE. She states that walking around her house causes her to feel weak and when outside in her yard feels as if she couldn't make it back to the house. She has been resting in her bed most of the last few days. Saw Dr. Rayburn Ma who increased her verapamil to 180 mg BID. She feels fullness in her throat when she is exerting herself.   Allergies  Allergen Reactions  . Atorvastatin Other (See Comments)    Myalgias  . Hydrocodone Other (See Comments)    GI distress.    Current Outpatient Prescriptions  Medication Sig Dispense Refill  . ALPRAZolam (XANAX) 0.25 MG tablet Take 1 tablet (0.25 mg total) by mouth 3 (three) times daily as needed for sleep. Takes 1/2 tablet as needed  100 tablet  0  . flecainide (TAMBOCOR) 100 MG tablet       . pravastatin (PRAVACHOL) 20 MG tablet TAKE 1 TABLET EVERY DAY  30 tablet  10  . ramipril (ALTACE) 2.5 MG tablet Take 2.5 mg by mouth daily.      . verapamil (VERELAN PM) 180 MG 24 hr capsule Take 180 mg by mouth 2 (two) times daily.       Marland Kitchen warfarin (COUMADIN) 5 MG tablet Take 1 tablet (5 mg total) by mouth as directed.  45 tablet  3  . omeprazole (PRILOSEC) 40 MG capsule Take 1 capsule (40 mg total) by  mouth daily.  30 capsule  11   No current facility-administered medications for this visit.    Past Medical History  Diagnosis Date  . Herpes zoster   . Arteriosclerotic cardiovascular disease (ASCVD)      cath Feb '07- no obstructive dz - 20% proximal LAD only; EF 65%; possible coronary artery spasm  . Hyperlipidemia     Lipid profile in 11/2008:282, 176, 64, 201  . Polio 1946    at age 44  . Peripheral neuropathy   . Anxiety   . Hypertension     diastolic dysfunction; normal CMet and TSH in 11/2009; normal CBC in 04/2010  . GERD (gastroesophageal reflux disease)     hiatal hernia  . Carcinoma of breast 2001    Left mastectomy with positive nodes  . Rectal bleed   . Chest pain     Long-standing and atypical  . Atrial fibrillation     Paroxysmal on event recorder in 2009; regular supraventricular tachycardia at a rate of 150 also identified on that study representing either atrial flutter or PSVT; onset of persistent AF in 03/2011  . Chronic anticoagulation 04/12/2011    Past Surgical History  Procedure Laterality Date  . Mastectomy      Left;modified radical with lymph node dissection  . Cholecystectomy  1999  . Colonoscopy  02/22/2011    Procedure: COLONOSCOPY;  Surgeon: Malissa Hippo, MD;  Location: [facility];  Service: Endoscopy;  Laterality: N/A;; performed for scant hematochezia  . Esophagogastroduodenoscopy  02/22/2011    Procedure: ESOPHAGOGASTRODUODENOSCOPY (EGD);  Surgeon: Malissa Hippo, MD;  Location: [facility];  Service: Endoscopy;  Laterality: N/A;    AOZ:HYQMVH of systems complete and found to be negative unless listed above  PHYSICAL EXAM BP 120/76  Pulse 65  Ht 5' 1.5" (1.562 m)  Wt 193 lb 6.4 oz (87.726 kg)  BMI 35.96 kg/m2  General: Well developed, well nourished, in no acute distress Head: Eyes PERRLA, No xanthomas.   Normal cephalic and atramatic  Lungs: Clear bilaterally to auscultation and percussion. Heart: HRRR S1 S2, without MRG.   Pulses are 2+ & equal.            No carotid bruit. No JVD.  No abdominal bruits. Abdomen: Bowel sounds are positive, abdomen soft and non-tender without masses or                  Hernia's noted. Msk:  Back normal, normal gait. Normal strength and tone for age. Extremities: No clubbing, cyanosis, non -pitting edema.  DP +1 Neuro: Alert and oriented X 3. Psych:  Anxious affect, responds appropriately  QIO:NGEXBM fibrillation RBBB,QTC 463 ms, OT 446 ms.  ASSESSMENT AND PLAN

## 2013-02-06 NOTE — Patient Instructions (Signed)
Your physician recommends that you schedule a follow-up appointment in: 1 WEEK  Your physician recommends that you return for lab work today. BMET, PRO-BNP,CBC,TSH  Your physician has requested that you have a lexiscan myoview. For further information please visit [email or web address] Please follow instruction sheet, as given.  Your physician has requested that you have an echocardiogram. Echocardiography is a painless test that uses sound waves to create images of your heart. It provides your doctor with information about the size and shape of your heart and how well your heart's chambers and valves are working. This procedure takes approximately one hour. There are no restrictions for this procedure.

## 2013-02-07 ENCOUNTER — Ambulatory Visit (HOSPITAL_COMMUNITY)
Admission: RE | Admit: 2013-02-07 | Discharge: 2013-02-07 | Disposition: A | Discharge status: Home / Self Care | Payer: Medicare Other | Source: Ambulatory Visit | Attending: Adult Health | Admitting: Adult Health

## 2013-02-07 DIAGNOSIS — I2510 Atherosclerotic heart disease of native coronary artery without angina pectoris: Secondary | ICD-10-CM

## 2013-02-07 DIAGNOSIS — I10 Essential (primary) hypertension: Secondary | ICD-10-CM

## 2013-02-07 DIAGNOSIS — R0602 Shortness of breath: Secondary | ICD-10-CM | POA: Insufficient documentation

## 2013-02-07 DIAGNOSIS — F411 Generalized anxiety disorder: Secondary | ICD-10-CM

## 2013-02-07 DIAGNOSIS — I4891 Unspecified atrial fibrillation: Secondary | ICD-10-CM

## 2013-02-07 DIAGNOSIS — G609 Hereditary and idiopathic neuropathy, unspecified: Secondary | ICD-10-CM

## 2013-02-07 DIAGNOSIS — R0989 Other specified symptoms and signs involving the circulatory and respiratory systems: Secondary | ICD-10-CM | POA: Insufficient documentation

## 2013-02-07 LAB — BASIC METABOLIC PANEL
BUN: 16 mg/dL (ref 6–23)
CO2: 29 mEq/L (ref 19–32)
Chloride: 102 mEq/L (ref 96–112)
Creat: 0.93 mg/dL (ref 0.50–1.10)
Potassium: 4.7 mEq/L (ref 3.5–5.3)

## 2013-02-07 MED ORDER — FUROSEMIDE 40 MG PO TABS: 40.0000 mg | ORAL_TABLET | Freq: Every day | ORAL | Status: DC

## 2013-02-07 MED ORDER — POTASSIUM CHLORIDE CRYS ER 20 MEQ PO TBCR: 20.0000 meq | EXTENDED_RELEASE_TABLET | Freq: Every day | ORAL | Status: DC

## 2013-02-07 NOTE — Addendum Note (Signed)
Addended by: Thompson Grayer on: 02/07/2013 10:57 AM   Modules accepted: Orders

## 2013-02-10 ENCOUNTER — Encounter (HOSPITAL_COMMUNITY)
Admission: RE | Admit: 2013-02-10 | Discharge: 2013-02-10 | Disposition: A | Discharge status: Home / Self Care | Payer: Medicare Other | Source: Ambulatory Visit | Attending: Adult Health | Admitting: Adult Health

## 2013-02-10 ENCOUNTER — Encounter (HOSPITAL_COMMUNITY): Payer: Self-pay

## 2013-02-10 ENCOUNTER — Ambulatory Visit (HOSPITAL_COMMUNITY)
Admission: RE | Admit: 2013-02-10 | Discharge: 2013-02-10 | Disposition: A | Discharge status: Home / Self Care | Payer: Medicare Other | Source: Ambulatory Visit | Attending: Adult Health | Admitting: Adult Health

## 2013-02-10 DIAGNOSIS — I2510 Atherosclerotic heart disease of native coronary artery without angina pectoris: Secondary | ICD-10-CM

## 2013-02-10 DIAGNOSIS — R0609 Other forms of dyspnea: Secondary | ICD-10-CM | POA: Insufficient documentation

## 2013-02-10 DIAGNOSIS — G609 Hereditary and idiopathic neuropathy, unspecified: Secondary | ICD-10-CM

## 2013-02-10 DIAGNOSIS — I4891 Unspecified atrial fibrillation: Secondary | ICD-10-CM

## 2013-02-10 DIAGNOSIS — I10 Essential (primary) hypertension: Secondary | ICD-10-CM

## 2013-02-10 DIAGNOSIS — I059 Rheumatic mitral valve disease, unspecified: Secondary | ICD-10-CM

## 2013-02-10 DIAGNOSIS — R0989 Other specified symptoms and signs involving the circulatory and respiratory systems: Secondary | ICD-10-CM | POA: Insufficient documentation

## 2013-02-10 HISTORY — DX: Disorder of kidney and ureter, unspecified: N28.9

## 2013-02-10 MED ORDER — TECHNETIUM TC 99M SESTAMIBI - CARDIOLITE
10.0000 | Freq: Once | INTRAVENOUS | Status: AC | PRN
  Administered 2013-02-10: 10 via INTRAVENOUS

## 2013-02-10 MED ORDER — SODIUM CHLORIDE 0.9 % IJ SOLN
INTRAMUSCULAR | Status: AC
  Administered 2013-02-10: 10 mL via INTRAVENOUS
  Filled 2013-02-10: qty 10

## 2013-02-10 MED ORDER — TECHNETIUM TC 99M SESTAMIBI - CARDIOLITE
30.0000 | Freq: Once | INTRAVENOUS | Status: AC | PRN
  Administered 2013-02-10: 11:00:00 30 via INTRAVENOUS

## 2013-02-10 MED ORDER — REGADENOSON 0.4 MG/5ML IV SOLN
INTRAVENOUS | Status: AC
  Administered 2013-02-10: 0.4 mg via INTRAVENOUS
  Filled 2013-02-10: qty 5

## 2013-02-10 NOTE — Progress Notes (Signed)
Stress Lab Nurses Notes - [name]  [name] 02/10/2013 Reason for doing test: CAD, AFib & DOE Type of test: [name] Nurse performing test: Parke Poisson, RN Nuclear Medicine Tech: Lyndel Pleasure Echo Tech: Not Applicable MD performing test: Lovina Reach Family MD: Ouida Sills Test explained and consent signed: yes IV started: 22g jelco, Saline lock flushed, No redness or edema and Saline lock started in radiology Symptoms: Lightheaded & feeling dizziness Treatment/Intervention: None Reason test stopped: protocol completed After recovery IV was: Discontinued via X-ray tech and No redness or edema Patient to return to Nuc. Med at : 11:15 Patient discharged: Home Patient's Condition upon discharge was: stable Comments: During test BP 109/67 & HR 86.  Recovery BP 110/66 & HR 74.  Symptoms resolved in recovery. [name] T

## 2013-02-10 NOTE — Progress Notes (Signed)
*  PRELIMINARY RESULTS* Echocardiogram 2D Echocardiogram has been performed.  Conrad Whatcom 02/10/2013, 11:17 AM

## 2013-02-11 LAB — BASIC METABOLIC PANEL
BUN: 16 mg/dL (ref 6–23)
Creat: 0.86 mg/dL (ref 0.50–1.10)
Potassium: 4 mEq/L (ref 3.5–5.3)

## 2013-02-14 ENCOUNTER — Encounter: Payer: Self-pay | Admitting: Adult Health

## 2013-02-14 ENCOUNTER — Ambulatory Visit (INDEPENDENT_AMBULATORY_CARE_PROVIDER_SITE_OTHER): Payer: Medicare Other | Admitting: Adult Health

## 2013-02-14 VITALS — BP 105/69 | HR 71 | Ht 61.0 in | Wt 190.0 lb

## 2013-02-14 DIAGNOSIS — R42 Dizziness and giddiness: Secondary | ICD-10-CM

## 2013-02-14 DIAGNOSIS — I10 Essential (primary) hypertension: Secondary | ICD-10-CM

## 2013-02-14 DIAGNOSIS — F411 Generalized anxiety disorder: Secondary | ICD-10-CM

## 2013-02-14 DIAGNOSIS — I2510 Atherosclerotic heart disease of native coronary artery without angina pectoris: Secondary | ICD-10-CM

## 2013-02-14 DIAGNOSIS — I4891 Unspecified atrial fibrillation: Secondary | ICD-10-CM

## 2013-02-14 NOTE — Progress Notes (Deleted)
Name: [name]    DOB: 05/08/1932  Age: 79 y.o.  MR#: [id number]       PCP:  Carylon Perches, MD      Insurance: Payor: [employer] [place] [employer] / Plan: BLUE MEDICARE / Product Type: *No Product type* /   CC:    Chief Complaint  Patient presents with  . Atrial Fibrillation    VS Filed Vitals:   02/14/13 1052  BP: 105/69  Pulse: 71  Height: 5\' 1"  (1.549 m)  Weight: 190 lb (86.183 kg)    Weights Current Weight  02/14/13 190 lb (86.183 kg)  02/06/13 193 lb 6.4 oz (87.726 kg)  12/16/12 185 lb 4 oz (84.029 kg)    Blood Pressure  BP Readings from Last 3 Encounters:  02/14/13 105/69  02/06/13 120/76  12/16/12 110/78     Admit date:  (Not on file) Last encounter with RMR:  02/06/2013   Allergy Atorvastatin and Hydrocodone  Current Outpatient Prescriptions  Medication Sig Dispense Refill  . ALPRAZolam (XANAX) 0.25 MG tablet Take 1 tablet (0.25 mg total) by mouth 3 (three) times daily as needed for sleep. Takes 1/2 tablet as needed  100 tablet  0  . flecainide (TAMBOCOR) 100 MG tablet       . furosemide (LASIX) 40 MG tablet Take 1 tablet (40 mg total) by mouth daily.  30 tablet  3  . potassium chloride SA (K-DUR,KLOR-CON) 20 MEQ tablet Take 1 tablet (20 mEq total) by mouth daily.  30 tablet  3  . pravastatin (PRAVACHOL) 20 MG tablet TAKE 1 TABLET EVERY DAY  30 tablet  10  . ramipril (ALTACE) 2.5 MG tablet Take 2.5 mg by mouth daily.      . verapamil (VERELAN PM) 180 MG 24 hr capsule Take 180 mg by mouth 2 (two) times daily.       Marland Kitchen warfarin (COUMADIN) 5 MG tablet Take 1 tablet (5 mg total) by mouth as directed.  45 tablet  3  . omeprazole (PRILOSEC) 40 MG capsule Take 1 capsule (40 mg total) by mouth daily.  30 capsule  11   No current facility-administered medications for this visit.    Discontinued Meds:   There are no discontinued medications.  Patient Active Problem List   Diagnosis Date Noted  . Wrist fracture, closed 12/10/2012  . Triquetral fracture  10/29/2012  . Sprain of right wrist 10/29/2012  . Chronic anticoagulation 04/12/2011  . Atrial fibrillation 03/22/2011  . Arteriosclerotic cardiovascular disease (ASCVD)   . Hyperlipidemia   . Polio   . Hypertension   . GERD (gastroesophageal reflux disease)   . Carcinoma of breast   . HERPES ZOSTER 11/12/2008  . ANXIETY 07/24/2007  . PERIPHERAL NEUROPATHY 07/24/2007    LABS    Component Value Date/Time   NA 136 02/10/2013 0000   NA 137 02/06/2013 1245   NA 138 04/05/2011 1115   K 4.0 02/10/2013 0000   K 4.7 02/06/2013 1245   K 4.4 04/05/2011 1115   CL 100 02/10/2013 0000   CL 102 02/06/2013 1245   CL 104 04/05/2011 1115   CO2 27 02/10/2013 0000   CO2 29 02/06/2013 1245   CO2 22 04/05/2011 1115   GLUCOSE 118* 02/10/2013 0000   GLUCOSE 96 02/06/2013 1245   GLUCOSE 82 04/05/2011 1115   BUN 16 02/10/2013 0000   BUN 16 02/06/2013 1245   BUN 14 04/05/2011 1115   CREATININE 0.86 02/10/2013 0000   CREATININE 0.93 02/06/2013  1245   CREATININE 0.80 04/05/2011 1115   CREATININE 0.80 11/24/2009 1128   CREATININE 0.79 11/16/2008 1343   CREATININE 0.9 11/12/2008 1508   CALCIUM 8.9 02/10/2013 0000   CALCIUM 9.2 02/06/2013 1245   CALCIUM 9.1 04/05/2011 1115   GFRNONAA 64.90 11/12/2008 1508   GFRNONAA >60 01/26/2008 0820   GFRNONAA 87 09/24/2006 1439   GFRAA  Value: >60        The eGFR has been calculated using the MDRD equation. This calculation has not been validated in all clinical 01/26/2008 0820   GFRAA 106 09/24/2006 1439   CMP     Component Value Date/Time   NA 136 02/10/2013 0000   K 4.0 02/10/2013 0000   CL 100 02/10/2013 0000   CO2 27 02/10/2013 0000   GLUCOSE 118* 02/10/2013 0000   BUN 16 02/10/2013 0000   CREATININE 0.86 02/10/2013 0000   CREATININE 0.80 11/24/2009 1128   CALCIUM 8.9 02/10/2013 0000   PROT 6.6 03/22/2011 1429   ALBUMIN 3.9 03/22/2011 1429   AST 17 03/22/2011 1429   ALT 8 03/22/2011 1429   ALKPHOS 90 03/22/2011 1429   BILITOT 0.8 03/22/2011 1429   GFRNONAA 64.90 11/12/2008 1508   GFRAA  Value: >60         The eGFR has been calculated using the MDRD equation. This calculation has not been validated in all clinical 01/26/2008 0820       Component Value Date/Time   WBC 7.0 02/06/2013 1245   WBC 6.8 05/08/2011 1450   WBC 6.7 04/05/2011 1115   WBC 5.7 11/24/2009 1128   WBC 6.3 11/16/2008 1343   WBC 7.5 10/01/2007 1517   HGB 11.9* 02/06/2013 1245   HGB 14.2 05/08/2011 1450   HGB 12.2 04/05/2011 1115   HGB 14.3 11/24/2009 1128   HGB 13.6 11/16/2008 1343   HGB 14.3 10/01/2007 1517   HCT 37.1 02/06/2013 1245   HCT 45.7 05/08/2011 1450   HCT 40.4 04/05/2011 1115   HCT 43.5 11/24/2009 1128   HCT 39.8 11/16/2008 1343   HCT 41.0 10/01/2007 1517   MCV 80.3 02/06/2013 1245   MCV 84.0 05/08/2011 1450   MCV 85.6 04/05/2011 1115   MCV 87.3 11/24/2009 1128   MCV 85.7 11/16/2008 1343   MCV 85.3 10/01/2007 1517    Lipid Panel     Component Value Date/Time   CHOL 189 11/20/2011 0820   TRIG 113 11/20/2011 0820   HDL 61 11/20/2011 0820   CHOLHDL 3.1 11/20/2011 0820   VLDL 23 11/20/2011 0820   LDLCALC 105* 11/20/2011 0820    ABG    Component Value Date/Time   TCO2 29 01/25/2008 1854     Lab Results  Component Value Date   TSH 3.973 02/06/2013   BNP (last 3 results)  Recent Labs  02/06/13 1245  PROBNP 1953.00*   Cardiac Panel (last 3 results) No results found for this basename: CKTOTAL, CKMB, TROPONINI, RELINDX,  in the last 72 hours  Iron/TIBC/Ferritin    Component Value Date/Time   IRON 190* 05/08/2011 1450   TIBC 409 05/08/2011 1450     EKG Orders placed in visit on 02/06/13  . EKG 12-LEAD     Prior Assessment and Plan Problem List as of 02/14/2013     Cardiovascular and Mediastinum   Arteriosclerotic cardiovascular disease (ASCVD)   Last Assessment & Plan   02/06/2013 Office Visit Written 02/06/2013 12:02 PM by Jodelle Gross, NP     She is having worsening symptoms  of DOE and throat tightness. She had a cardiac cath in 2007 with 20% LAD disease. She remains on flecainide. Uncertain if  these symptoms are related to progressive CAD. She is reluctant to proceed with cardiac cath, but is willing to have Lexiscan stress myoview. She may need need to be taken off of flecainide if she is having progressive CAD. Echo will be ordered along with BMET, Pro-BNP and CBC. Will see her in one week.    Hypertension   Last Assessment & Plan   12/16/2012 Office Visit Written 12/16/2012 12:02 PM by Jodelle Gross, NP     Excellent control of blood pressure her current medication regimen at 110/78. Would continue her on Altase and verapamil. No changes to her medication.    Atrial fibrillation   Last Assessment & Plan   02/06/2013 Office Visit Written 02/06/2013 12:03 PM by Jodelle Gross, NP     Heart rate is well controlled currently and she has no complaints of rapid HR or palpitations. Only increased fatigue. Echo is being ordered.She will continue on coumadin with dosing per Coumadin clinic      Digestive   GERD (gastroesophageal reflux disease)     Nervous and Auditory   PERIPHERAL NEUROPATHY   Polio     Musculoskeletal and Integument   Triquetral fracture   Sprain of right wrist   Wrist fracture, closed     Other   HERPES ZOSTER   ANXIETY   Last Assessment & Plan   02/06/2013 Office Visit Written 02/06/2013 12:03 PM by Jodelle Gross, NP     Uncertain if this is playing a role in her symptoms. She is quiet anxious about her state of health. Will do further testing for re-evaluation of her current status.    Hyperlipidemia   Last Assessment & Plan   10/24/2012 Office Visit Written 10/24/2012 11:50 AM by Jodelle Gross, NP     She is complaining of mild muscle aches and pains. She believes is related to pravastatin. I have asked her to stop pravastatin for one week to see if her symptoms had subsided. If they did not think she is assured that her-medications is not contributing to these symptoms, and she is to restart the medication. If she does have relief of symptoms  she is to call us so that we may adjust her medications.    Carcinoma of breast   Chronic anticoagulation   Last Assessment & Plan   12/16/2012 Office Visit Written 12/16/2012 12:02 PM by Jodelle Gross, NP     She continues our Coumadin clinic as medically compliant        Imaging: Dg Chest 2 View  02/07/2013   *RADIOLOGY REPORT*  Clinical Data: Shortness of breath.  CHEST - 2 VIEW  Comparison: March 22, 2011.  Findings: Stable cardiomegaly is noted.  Large hiatal hernia is again noted and unchanged. No pneumothorax or significant pleural effusion is noted.  Surgical clips are noted in left axillary region.  Mild central pulmonary vascular congestion is noted which is stable compared to prior exam.  No acute pulmonary disease is noted.  IMPRESSION: Stable cardiomegaly and mild central pulmonary vascular congestion. Large hiatal hernia remains.  No acute abnormality seen.   Original Report Authenticated By: Lupita Raider.,  M.D.   Nm Myocar Single W/spect W/wall Motion And Ef  02/10/2013   Sent is a 79 year old with history of coronary artery disease and atrial fibrillation.  Complained of shortness of breath.  Test to evaluate, rule out ischemia  Stress data:  The patient underwent Lexiscan stress testing per protocol baseline EKG showed atrial fibrillation.  Ventricular rate 76 beats per minute.  Baseline blood pressure 103/79.  Septal infarct.  Incomplete right bundle branch block.  The patient developed no chest pain during the infusion.  EKG showed no changes from baseline.  Nuclear data:  The patient was studied in 1-day rest stress protocol.  She was injected with 10 mCi technetium 99 labeled sestamibi at rest, 30 mCi technetium 99 labeled sestamibi at stress.  Images were reconstructed the short vertical and horizontal axes.  In the initial stress images there is minimal thinning noted in the distal anteroseptal as well as apical walls.  Otherwise normal perfusion.  In the recovery images  there is no significant change in this.  On review of the raw data there is soft tissue (diaphragm, breast) surrounding the heart.  On gating LVEF was calculated at 57% with overall normal wall motion.  Impression:  Lexiscan Sestamibi:   electrically negative for ischemia.  Sestamibi scan with the probable soft tissue attenuation and normal perfusion, no evidence of significant ischemia or scar. LVEF calculated at 57%.   Original Report Authenticated By: Dietrich Pates

## 2013-02-14 NOTE — Assessment & Plan Note (Addendum)
Pressure is low normal today. She remains on Lasix 40 mg daily potassium supplement ALT is 2.5 mg daily and verapamil 180 mg twice a day. She is playing some lightheadedness and dizziness with some fullness in her neck. I a.m. having orthostatic blood pressures completed here in the office. She was not found to be orthostatic. She is requesting carotid artery study to evaluate for carotid artery stenosis with her symptoms. She states she had one in the remote past, review of records does not demonstrate any results of this. Will order this to evaluate her symptoms. She remains very anxious about her overall physical status. And is requiring a definitive testing to satisfy her complaints and give her reassurance. She will followup with either Dr. Beulah Gandy or Dr. Wyline Mood on next visit.

## 2013-02-14 NOTE — Patient Instructions (Addendum)
Your physician recommends that you schedule a follow-up appointment in: 1 month  Your physician has requested that you have a carotid duplex. This test is an ultrasound of the carotid arteries in your neck. It looks at blood flow through these arteries that supply the brain with blood. Allow one hour for this exam. There are no restrictions or special instructions.

## 2013-02-14 NOTE — Progress Notes (Signed)
HPI [name] is a 79 year old patient of Dr. Dietrich Pates we are following for ongoing assessment and management of paroxysmal atrial fibrillation. The patient is on flecainide. She is also on Coumadin therapy. Patient was last seen in the office on 02/06/2013, feeling very tired with dyspnea on exertion. A verapamil had been increased to 180 mg twice a day by PCP prior to that visit. Last visit no changes were made in her medication regimen, her atrial fibrillation rate was controlled she was advised to take her medications at the same time every day. Concerning symptoms of chest and throat tightness along with dyspnea on exertion with known history of minimal coronary artery disease per cardiac cath in 2007 revealing a 20% LAD disease; and on flecainide, Lexiscan Myoview was ordered.   Scan completed on 02/10/2013 was negative for ischemia. Echocardiogram was normal with LVEF of 55%. She has a history of significant anxiety, which may be precipitating some of her symptoms.     He comes today with new symptoms of fullness in her neck along with dizziness and lightheadedness. She would like to have her carotid arteries evaluated. Allergies  Allergen Reactions  . Atorvastatin Other (See Comments)    Myalgias  . Hydrocodone Other (See Comments)    GI distress.    Current Outpatient Prescriptions  Medication Sig Dispense Refill  . ALPRAZolam (XANAX) 0.25 MG tablet Take 1 tablet (0.25 mg total) by mouth 3 (three) times daily as needed for sleep. Takes 1/2 tablet as needed  100 tablet  0  . flecainide (TAMBOCOR) 100 MG tablet       . furosemide (LASIX) 40 MG tablet Take 1 tablet (40 mg total) by mouth daily.  30 tablet  3  . potassium chloride SA (K-DUR,KLOR-CON) 20 MEQ tablet Take 1 tablet (20 mEq total) by mouth daily.  30 tablet  3  . pravastatin (PRAVACHOL) 20 MG tablet TAKE 1 TABLET EVERY DAY  30 tablet  10  . ramipril (ALTACE) 2.5 MG tablet Take 2.5 mg by mouth daily.      . verapamil (VERELAN  PM) 180 MG 24 hr capsule Take 180 mg by mouth 2 (two) times daily.       Marland Kitchen warfarin (COUMADIN) 5 MG tablet Take 1 tablet (5 mg total) by mouth as directed.  45 tablet  3  . omeprazole (PRILOSEC) 40 MG capsule Take 1 capsule (40 mg total) by mouth daily.  30 capsule  11   No current facility-administered medications for this visit.    Past Medical History  Diagnosis Date  . Herpes zoster   . Arteriosclerotic cardiovascular disease (ASCVD)      cath Feb '07- no obstructive dz - 20% proximal LAD only; EF 65%; possible coronary artery spasm  . Hyperlipidemia     Lipid profile in 11/2008:282, 176, 64, 201  . Polio 1946    at age 47  . Peripheral neuropathy   . Anxiety   . Hypertension     diastolic dysfunction; normal CMet and TSH in 11/2009; normal CBC in 04/2010  . GERD (gastroesophageal reflux disease)     hiatal hernia  . Carcinoma of breast 2001    Left mastectomy with positive nodes  . Rectal bleed   . Chest pain     Long-standing and atypical  . Atrial fibrillation     Paroxysmal on event recorder in 2009; regular supraventricular tachycardia at a rate of 150 also identified on that study representing either atrial flutter or PSVT; onset  of persistent AF in 03/2011  . Chronic anticoagulation 04/12/2011  . Renal insufficiency     Past Surgical History  Procedure Laterality Date  . Mastectomy      Left;modified radical with lymph node dissection  . Cholecystectomy  1999  . Colonoscopy  02/22/2011    Procedure: COLONOSCOPY;  Surgeon: Malissa Hippo, MD;  Location: [facility];  Service: Endoscopy;  Laterality: N/A;; performed for scant hematochezia  . Esophagogastroduodenoscopy  02/22/2011    Procedure: ESOPHAGOGASTRODUODENOSCOPY (EGD);  Surgeon: Malissa Hippo, MD;  Location: [facility];  Service: Endoscopy;  Laterality: N/A;    UEA:VWUJWJ of systems complete and found to be negative unless listed above  PHYSICAL EXAM BP 105/69  Pulse 71  Ht 5\' 1"  (1.549 m)  Wt 190  lb (86.183 kg)  BMI 35.92 kg/m2  General: Well developed, well nourished, in no acute distress, slightly anxious. Head: Eyes PERRLA, No xanthomas.   Normal cephalic and atramatic  Lungs: Clear bilaterally to auscultation and percussion. Heart: HRRR S1 S2, without MRG.  Pulses are 2+ & equal.            No carotid bruit. No JVD.   Abdomen: Bowel sounds are positive, abdomen soft and non-tender without masses or                  Hernia's noted. Msk:  Back normal, normal gait. Normal strength and tone for age. Extremities: No clubbing, cyanosis or edema.  DP +1 Neuro: Alert and oriented X 3. Psych:  Good affect, responds appropriately    ASSESSMENT AND PLAN

## 2013-02-14 NOTE — Assessment & Plan Note (Signed)
Heart rate remains normal sinus rhythm and at controlled rate. To continue on Flecainide and Coumadin therapy as directed.

## 2013-02-14 NOTE — Assessment & Plan Note (Signed)
The patient continues with anxiety, and is very focused on all over symptoms. Reassurance is given.

## 2013-02-20 ENCOUNTER — Ambulatory Visit (HOSPITAL_COMMUNITY)
Admission: RE | Admit: 2013-02-20 | Discharge: 2013-02-20 | Disposition: A | Discharge status: Home / Self Care | Payer: Medicare Other | Source: Ambulatory Visit | Attending: Adult Health | Admitting: Adult Health

## 2013-02-20 DIAGNOSIS — I4891 Unspecified atrial fibrillation: Secondary | ICD-10-CM | POA: Insufficient documentation

## 2013-02-20 DIAGNOSIS — I10 Essential (primary) hypertension: Secondary | ICD-10-CM | POA: Insufficient documentation

## 2013-02-20 DIAGNOSIS — I2510 Atherosclerotic heart disease of native coronary artery without angina pectoris: Secondary | ICD-10-CM

## 2013-02-20 DIAGNOSIS — R42 Dizziness and giddiness: Secondary | ICD-10-CM | POA: Insufficient documentation

## 2013-02-21 ENCOUNTER — Other Ambulatory Visit (HOSPITAL_COMMUNITY): Payer: Medicare Other

## 2013-03-05 ENCOUNTER — Other Ambulatory Visit: Payer: Self-pay

## 2013-03-05 DIAGNOSIS — Z1231 Encounter for screening mammogram for malignant neoplasm of breast: Secondary | ICD-10-CM

## 2013-03-05 DIAGNOSIS — Z9012 Acquired absence of left breast and nipple: Secondary | ICD-10-CM

## 2013-03-19 ENCOUNTER — Ambulatory Visit (INDEPENDENT_AMBULATORY_CARE_PROVIDER_SITE_OTHER): Payer: Medicare Other | Admitting: Cardiology

## 2013-03-19 ENCOUNTER — Encounter: Payer: Self-pay | Admitting: Cardiology

## 2013-03-19 ENCOUNTER — Ambulatory Visit (INDEPENDENT_AMBULATORY_CARE_PROVIDER_SITE_OTHER): Payer: Medicare Other | Admitting: *Deleted

## 2013-03-19 VITALS — BP 126/80 | HR 66 | Ht 61.0 in | Wt 187.0 lb

## 2013-03-19 DIAGNOSIS — I4891 Unspecified atrial fibrillation: Secondary | ICD-10-CM

## 2013-03-19 DIAGNOSIS — R0600 Dyspnea, unspecified: Secondary | ICD-10-CM

## 2013-03-19 DIAGNOSIS — R0989 Other specified symptoms and signs involving the circulatory and respiratory systems: Secondary | ICD-10-CM

## 2013-03-19 DIAGNOSIS — Z7901 Long term (current) use of anticoagulants: Secondary | ICD-10-CM

## 2013-03-19 DIAGNOSIS — I10 Essential (primary) hypertension: Secondary | ICD-10-CM

## 2013-03-19 DIAGNOSIS — R0609 Other forms of dyspnea: Secondary | ICD-10-CM

## 2013-03-19 LAB — POCT INR: INR: 2.8

## 2013-03-19 NOTE — Progress Notes (Signed)
Clinical Summary Ms. Tep is a 79 y.o.female 1. Non-obstructive CAD -cath 2007: nonobstrutive, 20% LAD lesion.  -last visit described some SOB and neck fullness/discomfort w/ exertion, stress test was ordered.  Lexiscan 02/06/13: negative    2. Afib - on verapamil and flecanide, coumadin for anticoag.  - denies any significant palpitations. Describes a fullness in neck w/ walking w/ SOB, w/ some occasional lightheadness. No chest pain, usually occurs w/ exertion. Reports similar symptoms 2 years, was cardioverted w/ temporary resolution of symtpoms, then returned. Feels daily w/ exertion.  3. HTN - checks bp at home occasionally, usually low 100s/70s - compliant w/ meds  4. HL:  -listed statin allergy, reports myaligias in the past with atorvastatin. Denies any leg pain on pravastatin.   Past Medical History  Diagnosis Date  . Herpes zoster   . Arteriosclerotic cardiovascular disease (ASCVD)      cath Feb '07- no obstructive dz - 20% proximal LAD only; EF 65%; possible coronary artery spasm  . Hyperlipidemia     Lipid profile in 11/2008:282, 176, 64, 201  . Polio 1946    at age 24  . Peripheral neuropathy   . Anxiety   . Hypertension     diastolic dysfunction; normal CMet and TSH in 11/2009; normal CBC in 04/2010  . GERD (gastroesophageal reflux disease)     hiatal hernia  . Carcinoma of breast 2001    Left mastectomy with positive nodes  . Rectal bleed   . Chest pain     Long-standing and atypical  . Atrial fibrillation     Paroxysmal on event recorder in 2009; regular supraventricular tachycardia at a rate of 150 also identified on that study representing either atrial flutter or PSVT; onset of persistent AF in 03/2011  . Chronic anticoagulation 04/12/2011  . Renal insufficiency      Allergies  Allergen Reactions  . Atorvastatin Other (See Comments)    Myalgias  . Hydrocodone Other (See Comments)    GI distress.     Current Outpatient Prescriptions    Medication Sig Dispense Refill  . ALPRAZolam (XANAX) 0.25 MG tablet Take 1 tablet (0.25 mg total) by mouth 3 (three) times daily as needed for sleep. Takes 1/2 tablet as needed  100 tablet  0  . flecainide (TAMBOCOR) 100 MG tablet       . furosemide (LASIX) 40 MG tablet Take 1 tablet (40 mg total) by mouth daily.  30 tablet  3  . omeprazole (PRILOSEC) 40 MG capsule Take 1 capsule (40 mg total) by mouth daily.  30 capsule  11  . potassium chloride SA (K-DUR,KLOR-CON) 20 MEQ tablet Take 1 tablet (20 mEq total) by mouth daily.  30 tablet  3  . pravastatin (PRAVACHOL) 20 MG tablet TAKE 1 TABLET EVERY DAY  30 tablet  10  . ramipril (ALTACE) 2.5 MG tablet Take 2.5 mg by mouth daily.      . verapamil (VERELAN PM) 180 MG 24 hr capsule Take 180 mg by mouth 2 (two) times daily.       Marland Kitchen warfarin (COUMADIN) 5 MG tablet Take 1 tablet (5 mg total) by mouth as directed.  45 tablet  3   No current facility-administered medications for this visit.     Past Surgical History  Procedure Laterality Date  . Mastectomy      Left;modified radical with lymph node dissection  . Cholecystectomy  1999  . Colonoscopy  02/22/2011    Procedure: COLONOSCOPY;  Surgeon: Joline Maxcy  Karilyn Cota, MD;  Location: [facility];  Service: Endoscopy;  Laterality: N/A;; performed for scant hematochezia  . Esophagogastroduodenoscopy  02/22/2011    Procedure: ESOPHAGOGASTRODUODENOSCOPY (EGD);  Surgeon: Malissa Hippo, MD;  Location: [facility];  Service: Endoscopy;  Laterality: N/A;     Allergies  Allergen Reactions  . Atorvastatin Other (See Comments)    Myalgias  . Hydrocodone Other (See Comments)    GI distress.      Denies significant FH for heart disease.    Social History Ms. Taussig reports that she has never smoked. She has never used smokeless tobacco. Ms. Geck reports that she does not drink alcohol.   Review of Systems 12 point ROS negative other than reported in HPI.   Physical Examination p 66 bp  126/80 Gen: NAD HEENT: sclera clear, pupils equal round and reactive CV: irreg, 2/6 systolic murmur at apex, no carotid bruits, no JVD Pulm: CTAB Abd: soft, NT, ND Ext: 1+ bilateral LE edema, warm Neuro: A&Ox3, no focal deficits   Diagnostic Studies 02/10/13 Echo: LVEF 55-60%, mild LVH, mild to mod MR, mod LAE, mild RV dilatation, mod RAE, mild-mod TR,  02/2013 Carotid US: no significant stenosis.Mild intimal thickening w/o focal plaque 02/06/13 MPI: no evidence of ischemia  Pertinent Labs: 02/10/13 K 4.0, Cr 0.86  01/2013 BNP 1953 TSH 3.97 10/2012: neg urine protein TC 184 TG 137 HDL 55 LDL 102   Assessment and Plan  1. CAD: non-obstructive disease by cath 2007, recent MPI for non-specific exertional symptoms showed no perfusion defects. Continue risk factor modification.  2. Afib: her exertional symptoms may be related to her afib. At rest she is rate controlled, but since her symptoms occur only w/ exertion I wonder if her rates w/ exertion are significantly elevated. She reports she felt similar symptoms prior to her cardioversion in the past, and her her symptoms improved - 24 hr holter monitor to evaluate heart rhythm and rates during symptoms - continue current meds for now, including coumadin: CHADS2 score is 2.   3. HTN: at goal, continue current meds  4. HL: at goal, continue current statin. Reports previous myalgias on atrova, is tolerating the prava well.  F/u 3 weeks   Antoine Poche, M.D., F.A.C.C.

## 2013-03-19 NOTE — Patient Instructions (Addendum)
Your physician recommends that you schedule a follow-up appointment in: 3 weeks   Your physician has recommended that you wear a holter monitor. Holter monitors are medical devices that record the heart's electrical activity. Doctors most often use these monitors to diagnose arrhythmias. Arrhythmias are problems with the speed or rhythm of the heartbeat. The monitor is a small, portable device. You can wear one while you do your normal daily activities. This is usually used to diagnose what is causing palpitations/syncope (passing out) FOR 24 HOURS, WE WILL CALL YOU WITH THE RESULTS

## 2013-03-25 ENCOUNTER — Ambulatory Visit
Admission: RE | Admit: 2013-03-25 | Discharge: 2013-03-25 | Disposition: A | Discharge status: Home / Self Care | Payer: Medicare Other | Source: Ambulatory Visit

## 2013-03-25 DIAGNOSIS — Z1231 Encounter for screening mammogram for malignant neoplasm of breast: Secondary | ICD-10-CM

## 2013-03-25 DIAGNOSIS — Z9012 Acquired absence of left breast and nipple: Secondary | ICD-10-CM

## 2013-03-26 ENCOUNTER — Ambulatory Visit (HOSPITAL_COMMUNITY)
Admission: RE | Admit: 2013-03-26 | Discharge: 2013-03-26 | Disposition: A | Discharge status: Home / Self Care | Payer: Medicare Other | Source: Ambulatory Visit | Attending: Cardiology | Admitting: Cardiology

## 2013-03-26 DIAGNOSIS — R0602 Shortness of breath: Secondary | ICD-10-CM

## 2013-03-26 DIAGNOSIS — I4891 Unspecified atrial fibrillation: Secondary | ICD-10-CM | POA: Insufficient documentation

## 2013-03-27 ENCOUNTER — Other Ambulatory Visit: Payer: Self-pay | Admitting: Internal Medicine

## 2013-03-27 DIAGNOSIS — R928 Other abnormal and inconclusive findings on diagnostic imaging of breast: Secondary | ICD-10-CM

## 2013-03-27 NOTE — Progress Notes (Signed)
24 hour Holter Monitor in progress. 

## 2013-04-11 ENCOUNTER — Ambulatory Visit
Admission: RE | Admit: 2013-04-11 | Discharge: 2013-04-11 | Disposition: A | Discharge status: Home / Self Care | Payer: Medicare Other | Source: Ambulatory Visit | Attending: Internal Medicine | Admitting: Internal Medicine

## 2013-04-11 ENCOUNTER — Other Ambulatory Visit: Payer: Self-pay | Admitting: Internal Medicine

## 2013-04-11 DIAGNOSIS — R921 Mammographic calcification found on diagnostic imaging of breast: Secondary | ICD-10-CM

## 2013-04-11 DIAGNOSIS — R928 Other abnormal and inconclusive findings on diagnostic imaging of breast: Secondary | ICD-10-CM

## 2013-04-15 ENCOUNTER — Ambulatory Visit: Payer: Medicare Other | Admitting: Cardiology

## 2013-04-22 ENCOUNTER — Ambulatory Visit
Admission: RE | Admit: 2013-04-22 | Discharge: 2013-04-22 | Disposition: A | Discharge status: Home / Self Care | Payer: PRIVATE HEALTH INSURANCE | Source: Ambulatory Visit | Attending: Internal Medicine | Admitting: Internal Medicine

## 2013-04-22 DIAGNOSIS — R921 Mammographic calcification found on diagnostic imaging of breast: Secondary | ICD-10-CM

## 2013-04-23 ENCOUNTER — Other Ambulatory Visit: Payer: Self-pay | Admitting: Internal Medicine

## 2013-04-23 DIAGNOSIS — D0511 Intraductal carcinoma in situ of right breast: Secondary | ICD-10-CM

## 2013-04-24 ENCOUNTER — Encounter (INDEPENDENT_AMBULATORY_CARE_PROVIDER_SITE_OTHER): Payer: Medicare Other

## 2013-04-24 ENCOUNTER — Encounter: Payer: Self-pay | Admitting: Cardiology

## 2013-04-24 ENCOUNTER — Ambulatory Visit (INDEPENDENT_AMBULATORY_CARE_PROVIDER_SITE_OTHER): Payer: Medicare Other | Admitting: Cardiology

## 2013-04-24 VITALS — BP 112/74 | HR 58 | Ht 61.5 in | Wt 187.8 lb

## 2013-04-24 DIAGNOSIS — Z7901 Long term (current) use of anticoagulants: Secondary | ICD-10-CM

## 2013-04-24 DIAGNOSIS — I4891 Unspecified atrial fibrillation: Secondary | ICD-10-CM

## 2013-04-24 MED ORDER — FLECAINIDE ACETATE 150 MG PO TABS: 150.0000 mg | ORAL_TABLET | Freq: Two times a day (BID) | ORAL | Status: DC

## 2013-04-24 NOTE — Progress Notes (Signed)
Clinical Summary [name] is a 79 y.o.female seen today for follow up for the following problems   1. Non-obstructive CAD  -cath 2007: nonobstrutive, 20% LAD lesion.  -last visit described some SOB and neck fullness/discomfort w/ exertion, stress test was ordered.  Lexiscan 02/06/13: negative   2. Afib  - on verapamil and flecanide, coumadin for anticoag.  - denies any significant palpitations. Describes a fullness in neck w/ walking w/ SOB, w/ some occasional lightheadness. No chest pain, usually occurs w/ exertion. Reports similar symptoms 2 years, was cardioverted w/ temporary resolution of symtpoms, then returned. Feels daily w/ exertion. - 24 hr holter shows afib with good rate control, symptoms of palpitations associated with afib with normal rates 60-100.    3. HTN  - checks bp at home occasionally, usually low 100s/70s  - compliant w/ meds   4. HL:  -listed statin allergy, reports myaligias in the past with atorvastatin. Denies any leg pain on pravastatin.   Past Medical History  Diagnosis Date  . Herpes zoster   . Arteriosclerotic cardiovascular disease (ASCVD)      cath Feb '07- no obstructive dz - 20% proximal LAD only; EF 65%; possible coronary artery spasm  . Hyperlipidemia     Lipid profile in 11/2008:282, 176, 64, 201  . Polio 1946    at age 3  . Peripheral neuropathy   . Anxiety   . Hypertension     diastolic dysfunction; normal CMet and TSH in 11/2009; normal CBC in 04/2010  . GERD (gastroesophageal reflux disease)     hiatal hernia  . Carcinoma of breast 2001    Left mastectomy with positive nodes  . Rectal bleed   . Chest pain     Long-standing and atypical  . Atrial fibrillation     Paroxysmal on event recorder in 2009; regular supraventricular tachycardia at a rate of 150 also identified on that study representing either atrial flutter or PSVT; onset of persistent AF in 03/2011  . Chronic anticoagulation 04/12/2011  . Renal insufficiency       Allergies  Allergen Reactions  . Atorvastatin Other (See Comments)    Myalgias  . Hydrocodone Other (See Comments)    GI distress.     Current Outpatient Prescriptions  Medication Sig Dispense Refill  . ALPRAZolam (XANAX) 0.25 MG tablet Take 1 tablet (0.25 mg total) by mouth 3 (three) times daily as needed for sleep. Takes 1/2 tablet as needed  100 tablet  0  . flecainide (TAMBOCOR) 100 MG tablet Take 100 mg by mouth 2 (two) times daily.       . furosemide (LASIX) 40 MG tablet Take 1 tablet (40 mg total) by mouth daily.  30 tablet  3  . omeprazole (PRILOSEC) 40 MG capsule Take 1 capsule (40 mg total) by mouth daily.  30 capsule  11  . potassium chloride SA (K-DUR,KLOR-CON) 20 MEQ tablet Take 1 tablet (20 mEq total) by mouth daily.  30 tablet  3  . pravastatin (PRAVACHOL) 20 MG tablet TAKE 1 TABLET EVERY DAY  30 tablet  10  . ramipril (ALTACE) 2.5 MG tablet Take 2.5 mg by mouth daily.      . ranitidine (ZANTAC) 150 MG tablet Take 150 mg by mouth at bedtime.      . verapamil (VERELAN PM) 180 MG 24 hr capsule Take 180 mg by mouth 2 (two) times daily.       Marland Kitchen warfarin (COUMADIN) 5 MG tablet Take 1 tablet (  5 mg total) by mouth as directed.  45 tablet  3   No current facility-administered medications for this visit.     Past Surgical History  Procedure Laterality Date  . Mastectomy      Left;modified radical with lymph node dissection  . Cholecystectomy  1999  . Colonoscopy  02/22/2011    Procedure: COLONOSCOPY;  Surgeon: Shelby Hippo, MD;  Location: [facility];  Service: Endoscopy;  Laterality: N/A;; performed for scant hematochezia  . Esophagogastroduodenoscopy  02/22/2011    Procedure: ESOPHAGOGASTRODUODENOSCOPY (EGD);  Surgeon: Shelby Hippo, MD;  Location: [facility];  Service: Endoscopy;  Laterality: N/A;     Allergies  Allergen Reactions  . Atorvastatin Other (See Comments)    Myalgias  . Hydrocodone Other (See Comments)    GI distress.      No family  history on file.   Social History [name] reports that she has never smoked. She has never used smokeless tobacco. [name] reports that she does not drink alcohol.   Review of Systems CONSTITUTIONAL: No weight loss, fever, chills, weakness or fatigue.  HEENT: Eyes: No visual loss, blurred vision, double vision or yellow sclerae.No hearing loss, sneezing, congestion, runny nose or sore throat.  SKIN: No rash or itching.  CARDIOVASCULAR: per HPI RESPIRATORY: No shortness of breath, cough or sputum.  GASTROINTESTINAL: No anorexia, nausea, vomiting or diarrhea. No abdominal pain or blood.  GENITOURINARY: No burning on urination, no polyuria NEUROLOGICAL: No headache, dizziness, syncope, paralysis, ataxia, numbness or tingling in the extremities. No change in bowel or bladder control.  MUSCULOSKELETAL: No muscle, back pain, joint pain or stiffness.  LYMPHATICS: No enlarged nodes. No history of splenectomy.  PSYCHIATRIC: No history of depression or anxiety.  ENDOCRINOLOGIC: No reports of sweating, cold or heat intolerance. No polyuria or polydipsia.  Marland Kitchen   Physical Examination p 58 bp 112/74 Wt 187 lbs BMI 35 Gen: resting comfortably, no acute distress HEENT: no scleral icterus, pupils equal round and reactive, no palptable cervical adenopathy,  CV: irreg, no m/r/g, no JVD, no carotid bruits Resp: Clear to auscultation bilaterally GI: abdomen is soft, non-tender, non-distended, normal bowel sounds, no hepatosplenomegaly MSK: extremities are warm, no edema.  Skin: warm, no rash Neuro:  no focal deficits Psych: appropriate affect   Diagnostic Studies 03/31/13 24 hr holter: afib with normal rate control, symptoms of palpitations associated with afib rates 60-100. Min HR 54 max hr 115 avg 82  02/10/13 Echo: LVEF 55-60%, mild LVH, mild to mod MR, mod LAE, mild RV dilatation, mod RAE, mild-mod TR,   02/2013 Carotid US: no significant stenosis.Mild intimal thickening w/o focal plaque    02/06/13 MPI: no evidence of ischemia   Pertinent Labs: 02/10/13 K 4.0, Cr 0.86  01/2013 BNP 1953 TSH 3.97  10/2012: neg urine protein TC 184 TG 137 HDL 55 LDL 102  04/24/13 Clinic EKG: SR, RBBB, LAFB, rate 73   Assessment and Plan  1. CAD: non-obstructive disease by cath 2007, recent MPI for non-specific exertional symptoms showed no perfusion defects. Continue risk factor modification.   2. Afib: - her exertional symptoms may be related to her afib, though holter shows overall very well controlled rates - she is on however her last several EKGs have shown afib, she has good rate control with verapamil.  - flecanide no long effective, concern about increasing dose in setting of bifascicular blocker (her last sinus rhythm EKG also showed 1st degree AV block)  - will refer to EP  for recommendations on possible further rhythm control agents. Stop flecanide and continue verapamil for rate control  - continue current meds for now, including coumadin: CHADS2 score is 2.   3. HTN: at goal, continue current meds    4. HL: at goal, continue current statin. Reports previous myalgias on atrova, is tolerating the prava well.          Antoine Poche, M.D., F.A.C.C.

## 2013-04-24 NOTE — Patient Instructions (Addendum)
Your physician recommends that you schedule a follow-up appointment in: 05-12-13 FOR A NURSE VISIT TO HAVE AN EKG PERFORMED   Your physician has recommended you make the following change in your medication:   1) INCREASE FLECAINIDE TO 150MG  TWICE DAILY 05-05-13

## 2013-05-02 ENCOUNTER — Other Ambulatory Visit: Payer: Medicare Other

## 2013-05-05 ENCOUNTER — Ambulatory Visit (INDEPENDENT_AMBULATORY_CARE_PROVIDER_SITE_OTHER): Payer: Medicare Other | Admitting: *Deleted

## 2013-05-05 DIAGNOSIS — I4891 Unspecified atrial fibrillation: Secondary | ICD-10-CM

## 2013-05-05 DIAGNOSIS — Z7901 Long term (current) use of anticoagulants: Secondary | ICD-10-CM

## 2013-05-05 LAB — POCT INR: INR: 3.4

## 2013-05-06 NOTE — Addendum Note (Signed)
Addended by: Derry Lory A on: 05/06/2013 06:21 PM   Modules accepted: Orders

## 2013-05-07 ENCOUNTER — Ambulatory Visit
Admission: RE | Admit: 2013-05-07 | Discharge: 2013-05-07 | Disposition: A | Discharge status: Home / Self Care | Payer: PRIVATE HEALTH INSURANCE | Source: Ambulatory Visit | Attending: Internal Medicine | Admitting: Internal Medicine

## 2013-05-07 DIAGNOSIS — D0511 Intraductal carcinoma in situ of right breast: Secondary | ICD-10-CM

## 2013-05-07 MED ORDER — GADOBENATE DIMEGLUMINE 529 MG/ML IV SOLN
17.0000 mL | Freq: Once | INTRAVENOUS | Status: AC | PRN
  Administered 2013-05-07: 17 mL via INTRAVENOUS

## 2013-05-09 ENCOUNTER — Ambulatory Visit (INDEPENDENT_AMBULATORY_CARE_PROVIDER_SITE_OTHER): Payer: Medicare Other | Admitting: Surgery

## 2013-05-09 ENCOUNTER — Encounter (INDEPENDENT_AMBULATORY_CARE_PROVIDER_SITE_OTHER): Payer: Self-pay | Admitting: Surgery

## 2013-05-09 VITALS — BP 120/78 | HR 64 | Temp 98.2°F | Resp 14 | Ht 60.5 in | Wt 182.8 lb

## 2013-05-09 DIAGNOSIS — C50911 Malignant neoplasm of unspecified site of right female breast: Secondary | ICD-10-CM | POA: Insufficient documentation

## 2013-05-09 DIAGNOSIS — C50912 Malignant neoplasm of unspecified site of left female breast: Secondary | ICD-10-CM

## 2013-05-09 DIAGNOSIS — C50919 Malignant neoplasm of unspecified site of unspecified female breast: Secondary | ICD-10-CM

## 2013-05-09 NOTE — Progress Notes (Signed)
Chief Complaint:  Right breast cancer  History of Present Illness:  [name] is an 79 y.o. female who had a previous left mastectomy by Dr. Streck in early 2000. I subsequently took care of her husband who had complicated hernia repair requiring prolonged hospitalization with a long and he subsequently died from metastatic lung cancer. She had a recent mammogram which showed a new lesion in her right breast which on biopsy is invasive carcinoma with surrounding DCIS. She has a pretty marked sized hematoma in this biopsy site. I reviewed her MR scan. I asked her what she was leaning toward and she would like to have a lumpectomy. I think what we will do is look toward doing needle localization of the mass with sentinel node biopsy.    Past Medical History  Diagnosis Date  . Herpes zoster   . Arteriosclerotic cardiovascular disease (ASCVD)      cath Feb '07- no obstructive dz - 20% proximal LAD only; EF 65%; possible coronary artery spasm  . Hyperlipidemia     Lipid profile in 11/2008:282, 176, 64, 201  . Polio 1946    at age 23  . Peripheral neuropathy   . Anxiety   . Hypertension     diastolic dysfunction; normal CMet and TSH in 11/2009; normal CBC in 04/2010  . GERD (gastroesophageal reflux disease)     hiatal hernia  . Carcinoma of breast 2001    Left mastectomy with positive nodes  . Rectal bleed   . Chest pain     Long-standing and atypical  . Atrial fibrillation     Paroxysmal on event recorder in 2009; regular supraventricular tachycardia at a rate of 150 also identified on that study representing either atrial flutter or PSVT; onset of persistent AF in 03/2011  . Chronic anticoagulation 04/12/2011  . Renal insufficiency     Past Surgical History  Procedure Laterality Date  . Mastectomy      Left;modified radical with lymph node dissection  . Cholecystectomy  1999  . Colonoscopy  02/22/2011    Procedure: COLONOSCOPY;  Surgeon: Najeeb U Rehman, MD;  Location: [facility];   Service: Endoscopy;  Laterality: N/A;; performed for scant hematochezia  . Esophagogastroduodenoscopy  02/22/2011    Procedure: ESOPHAGOGASTRODUODENOSCOPY (EGD);  Surgeon: Najeeb U Rehman, MD;  Location: [facility];  Service: Endoscopy;  Laterality: N/A;    Current Outpatient Prescriptions  Medication Sig Dispense Refill  . furosemide (LASIX) 40 MG tablet Take 1 tablet (40 mg total) by mouth daily.  30 tablet  3  . meclizine (ANTIVERT) 25 MG tablet Take 25 mg by mouth 3 (three) times daily as needed.      . potassium chloride SA (K-DUR,KLOR-CON) 20 MEQ tablet Take 1 tablet (20 mEq total) by mouth daily.  30 tablet  3  . pravastatin (PRAVACHOL) 20 MG tablet TAKE 1 TABLET EVERY DAY  30 tablet  10  . ramipril (ALTACE) 2.5 MG tablet Take 2.5 mg by mouth daily.      . ranitidine (ZANTAC) 150 MG tablet Take 150 mg by mouth at bedtime.      . verapamil (VERELAN PM) 180 MG 24 hr capsule Take 180 mg by mouth 2 (two) times daily.       . warfarin (COUMADIN) 5 MG tablet Take 1 tablet (5 mg total) by mouth as directed.  45 tablet  3   No current facility-administered medications for this visit.   Atorvastatin and Hydrocodone Family History  Problem Relation   Age of Onset  . Cancer Sister     lung  . Cancer Brother     lipoma  . Cancer Brother     kidney   Social History:   reports that she has never smoked. She has never used smokeless tobacco. She reports that she does not drink alcohol or use illicit drugs.   REVIEW OF SYSTEMS - PERTINENT POSITIVES ONLY: She had chemotherapy previously. Her old chart is not available to me. She is followed by Dr. Roy Fagan.  Physical Exam:   Blood pressure 120/78, pulse 64, temperature 98.2 F (36.8 C), temperature source Temporal, resp. rate 14, height 5' 0.5" (1.537 m), weight 182 lb 12.8 oz (82.918 kg). Body mass index is 35.1 kg/(m^2).  Gen:  WDWN white female NAD  Neurological: Alert and oriented to person, place, and time. Motor and sensory  function is grossly intact  Head: Normocephalic and atraumatic.  Eyes: Conjunctivae are normal. Pupils are equal, round, and reactive to light. No scleral icterus.  Neck: Normal range of motion. Neck supple. No tracheal deviation or thyromegaly present.  Cardiovascular:  SR without murmurs or gallops.  No carotid bruits Respiratory: Effort normal.  No respiratory distress. No chest wall tenderness. Breath sounds normal.  No wheezes, rales or rhonchi.  Abdomen:  nontender GU: Musculoskeletal: Normal range of motion. Extremities are nontender. No cyanosis, edema or clubbing noted Lymphadenopathy: No cervical, preauricular, postauricular or axillary adenopathy is present Skin: Skin is warm and dry. No rash noted. No diaphoresis. No erythema. No pallor. Pscyh: Normal mood and affect. Behavior is normal. Judgment and thought content normal.   LABORATORY RESULTS: No results found for this or any previous visit (from the past 48 hour(s)).  RADIOLOGY RESULTS: No results found.  Problem List: Patient Active Problem List   Diagnosis Date Noted  . Wrist fracture, closed 12/10/2012  . Triquetral fracture 10/29/2012  . Sprain of right wrist 10/29/2012  . Chronic anticoagulation 04/12/2011  . Atrial fibrillation 03/22/2011  . Arteriosclerotic cardiovascular disease (ASCVD)   . Hyperlipidemia   . Polio   . Hypertension   . GERD (gastroesophageal reflux disease)   . Carcinoma of breast   . HERPES ZOSTER 11/12/2008  . ANXIETY 07/24/2007  . PERIPHERAL NEUROPATHY 07/24/2007    Assessment & Plan: New right breast cancer the patient has previously had a left mastectomy. We'll move toward needle lobe right lumpectomy and sentinel lymph node biopsy.    Matt B. Terrye Dombrosky, MD, FACS  [facility] [phone] beeper [phone]  05/09/2013 12:33 PM     

## 2013-05-09 NOTE — Patient Instructions (Signed)
Breast Cancer Survivor Follow-Up Breast cancer begins when cells in the breast divide too rapidly. The extra cells form a lump (tumor). When the cancer is treated, the goal is to get rid of all cancer cells. However, sometimes a few cells survive. These cancer cells can then grow. They become recurrent cancer. This means the cancer comes back after treatment.  Most cases of recurrent breast cancer develop 3 to 5 years after treatment. However, sometimes it comes back just a few months after treatment. Other times, it does not come back until years later. If the cancer comes back in the same area as the first breast cancer, it is called a local recurrence. If the cancer comes back somewhere else in the body, it is called regional recurrence if the site is fairly near the breast or distant recurrence if it is far from the breast. Your caregiver may also use the term metastasize to indicate a cancer that has gone to another part of your body. Treatment is still possible after either kind of recurrence. The cancer can still be controlled.  CAUSES OF RECURRENT CANCER No one knows exactly why breast cancer starts in the first place. Why the cancer comes back after treatment is also not clear. It is known that certain conditions, called risk factors, can make this more likely. They include:  Developing breast cancer for the first time before age 60.  Having breast cancer that involves the lymph nodes. These are small, round pieces of tissue found all over the body. Their job is to help fight infections.  Having a large tumor. Cancer is more apt to come back if the first tumor was bigger than 2 inches (5 cm).  Having certain types of breast cancer, such as:  Inflammatory breast cancer. This rare type grows rapidly and causes the breast to become red and swollen.  A high-grade tumor. The grade of a tumor indicates how fast it will grow and spread. High-grade tumors grow more quickly than other types.  HER2  cancer. This refers to the tumor's genetic makeup. Tumors that have this type of gene are more likely to come back after treatment.  Having close tumor margins. This refers to the space between the tumor and normal, noncancerous cells. If the space is small, the tumor has a greater chance of coming back.  Having treatment involving a surgery to remove the tumor but not the entire breast (lumpectomy) and no radiation therapy. CARE AFTER BREAST CANCER Home Monitoring Women who have had breast cancer should continue to examine their breasts every month. The goal is to catch the cancer quickly if it comes back. Many women find it helpful to do so on the same day each month and to mark the calendar as a reminder. Let your caregiver know immediately if you have any signs of recurrent breast cancer. Symptoms will vary, depending on where the cancer recurs. The original type of treatment can also make a difference. Symptoms of local recurrence after a lumpectomy or a recurrence in the opposite breast may include:  A new lump or thickening in the breast.  A change in the way the skin looks on the breast (such as a rash, dimpling, or wrinkling).  Redness or swelling of the breast.  Changes in the nipple (such as being red, puckered, swollen, or leaking fluid). Symptoms of a recurrence after a breast removal surgery (mastectomy) may include:  A lump or thickening under the skin.  A thickening around the mastectomy scar. Symptoms   of regional recurrence in the lymph nodes near the breast may include:  A lump under the arm or above the collarbone.  Swelling of the arm.  Pain in the arm, shoulder, or chest.  Numbness in the hand or arm. Symptoms of distant recurrence may include:  A cough that does not go away.  Trouble breathing or shortness of breath.  Pain in the bones or the chest. This is pain that lasts or does not respond to rest and medicine.  Headaches.  Sudden vision  problems.  Dizziness.  Nausea or vomiting.  Losing weight without trying to.  Persistent abdominal pain.  Changes in bowel movements or blood in the stool.  Yellowing of the skin or eyes (jaundice).  Blood in the urine or bloody vaginal discharge. Clinical Monitoring  It is helpful to keep a schedule of appointments for needed tests and exams. This includes physical exams, breast exams, exams of the lymph nodes, and general exams.  For the first 3 years after being treated for breast cancer, see your caregiver every 3 to 6 months.  For years 4 and 5 after breast cancer, see your caregiver every 6 to 12 months.  After 5 years, see your caregiver at least once a year.  Regular breast X-rays (mammograms) should continue even if you had a mastectomy.  A mammogram should be done 1 year after the mammogram that first detected breast cancer.  A mammogram should be done every 6 to 12 months after that. Follow your caregiver's advice.  A pelvic exam done by your caregiver checks whether female organs are the normal size and shape. The exam is usually done every year. Ask your caregiver if that schedule is right for you.  Women taking tamoxifen should report any vaginal bleeding immediately to their caregiver. Tamoxifen is often given to women with a certain type of breast cancer. It has been shown to help prevent recurrence.  You will need to decide who your primary caregiver will be.  Most people continue to see their cancer specialist (oncologist) every 3 to 6 months for the first year after cancer treatment.  At some point, you may want to go back to seeing your family caregiver. You would no longer see your oncologist for regular checkups. Many women do this about 1 year after their first diagnosis of breast cancer.  You will still need to be seen every so often by your oncologist. Ask how often that should be. Coordinate this with your family or primary caregiver.  Think about  having genetic counseling. This would provide information on traits that can be passed or inherited from one generation to the next. In some cases, breast cancer runs in families. Tell your caregiver if you:  Are of Ashkenazi Jewish heritage.  Have any family member who has had ovarian cancer.  Have a mother, sister, or daughter who had breast cancer before age 50.  Have 2 or more close relatives who have had breast cancer. This means a mother, sister, daughter, aunt, or grandmother.  Had breast cancer in both breasts.  Have a female relative who has had breast cancer.  Some tests are not recommended for routine screening. Someone recovering from breast cancer does not need to have these tests if there are no problems. The tests have risks, such as radiation exposure, and can be costly. The risks of these tests are thought to be greater than the benefits:  Blood tests.  Chest X-rays.  Bone scans.  Liver ultrasound.    Computed tomography (CT scan).  Positron emission tomography (PET scan).  Magnetic resonance imaging (MRI scan). DIAGNOSIS OF RECURRENT CANCER Recurrent breast cancer may be suspected for various reasons. A mammogram may not look normal. You might feel a lump or have other symptoms. Your caregiver may find something unusual during an exam. To be sure, your caregiver will probably order some tests. The tests are needed because there are symptoms or hints of a problem. They could include:  Blood tests, including a test to check how well the liver is working. The liver is a common site for a distant cancer recurrence.  Imaging tests that create pictures of the inside of the body. These tests include:  Chest X-rays to show if the cancer has come back in the lungs.  CT scans to create detailed pictures of various areas of the body and help find a distant recurrence.  MRI scans to find anything unusual in the breast, chest, or lymph nodes.  Breast ultrasound tests to  examine the breasts.  Bone scans to create a picture of your whole skeleton and find cancer in bony areas.  PET scans to create an image of the whole body. PET scans can be used together with CT scans to show more detail.  Biopsy. A small sample of tissue is taken and checked under a microscope. If cancer cells are found, they may be tested to see if they contain the HER2 gene or the hormones estrogen and progesterone. This will help your caregiver decide how to treat the recurrent cancer. TREATMENT  How recurrent breast cancer is treated depends on where the new cancer is found. The type of treatment that was used for the first breast cancer makes a difference, too. A combination of treatments may be used. Options include:  Surgery.  If the cancer comes back in the breast that was not treated before, you may need a lumpectomy or mastectomy.  If the cancer comes back in the breast that was treated before, you may need a mastectomy.  The lymph nodes under the arm may need to be removed.  Radiation therapy.  For a local recurrence, radiation may be used if it was not used during the first treatment.  For a distance recurrence, radiation is sometimes used.  Chemotherapy.  This may be used before surgery to treat recurrent breast cancer.  This may be used to treat recurrent cancer that cannot be treated with surgery.  This may be used to treat a distant recurrence.  Hormone therapy.  Women with the HER2 gene may be given hormone therapy to attack this gene. Document Released: 02/22/2011 Document Revised: 09/18/2011 Document Reviewed: 02/22/2011 [employer] Patient Information 2014 [employer].  

## 2013-05-15 ENCOUNTER — Encounter: Payer: Self-pay | Admitting: Internal Medicine

## 2013-05-15 ENCOUNTER — Ambulatory Visit (INDEPENDENT_AMBULATORY_CARE_PROVIDER_SITE_OTHER): Payer: Medicare Other | Admitting: Internal Medicine

## 2013-05-15 ENCOUNTER — Ambulatory Visit (INDEPENDENT_AMBULATORY_CARE_PROVIDER_SITE_OTHER): Payer: Medicare Other | Admitting: *Deleted

## 2013-05-15 VITALS — BP 108/68 | HR 75 | Ht 61.5 in | Wt 185.1 lb

## 2013-05-15 DIAGNOSIS — I4891 Unspecified atrial fibrillation: Secondary | ICD-10-CM

## 2013-05-15 DIAGNOSIS — I10 Essential (primary) hypertension: Secondary | ICD-10-CM

## 2013-05-15 DIAGNOSIS — Z7901 Long term (current) use of anticoagulants: Secondary | ICD-10-CM

## 2013-05-15 LAB — POCT INR: INR: 2.5

## 2013-05-15 MED ORDER — DABIGATRAN ETEXILATE MESYLATE 150 MG PO CAPS: 150.0000 mg | ORAL_CAPSULE | Freq: Two times a day (BID) | ORAL | Status: DC

## 2013-05-15 MED ORDER — AMIODARONE HCL 200 MG PO TABS: 200.0000 mg | ORAL_TABLET | Freq: Two times a day (BID) | ORAL | Status: DC

## 2013-05-15 NOTE — Progress Notes (Signed)
HPI [name] is referred today by Dr. Wyline Mood for evaluation of atrial fibrillation. I've not seen the patient in several years, and she had previously been followed by Dr. Dietrich Pates. She is a very pleasant 79 year old woman with a history of paroxysmal atrial fibrillation which has become persistent. She was on flecainide for many years but eventually, the flecainide became ineffective. The patient has not had syncope. At rest, she feels well. With exertion, she quickly become short of breath and has to stop and rest. The patient has been on warfarin therapy and prior to that pradaxa. She is pending breast surgery in approximately 10 days. Allergies  Allergen Reactions  . Atorvastatin Other (See Comments)    Myalgias  . Hydrocodone Other (See Comments)    GI distress.     Current Outpatient Prescriptions  Medication Sig Dispense Refill  . ALPRAZolam (XANAX) 0.25 MG tablet TAKE 1/2 TABLET AS NEEDED      . furosemide (LASIX) 40 MG tablet Take 40 mg by mouth as needed.      . meclizine (ANTIVERT) 25 MG tablet Take 25 mg by mouth 3 (three) times daily as needed.      . potassium chloride SA (K-DUR,KLOR-CON) 20 MEQ tablet Take 1 tablet (20 mEq total) by mouth daily.  30 tablet  3  . pravastatin (PRAVACHOL) 20 MG tablet TAKE 1 TABLET EVERY DAY  30 tablet  10  . ramipril (ALTACE) 2.5 MG tablet Take 2.5 mg by mouth daily.      . ranitidine (ZANTAC) 150 MG tablet Take 150 mg by mouth at bedtime.      . verapamil (VERELAN PM) 180 MG 24 hr capsule Take 180 mg by mouth 2 (two) times daily.       Marland Kitchen warfarin (COUMADIN) 5 MG tablet Take 1 tablet (5 mg total) by mouth as directed.  45 tablet  3   No current facility-administered medications for this visit.     Past Medical History  Diagnosis Date  . Herpes zoster   . Arteriosclerotic cardiovascular disease (ASCVD)      cath Feb '07- no obstructive dz - 20% proximal LAD only; EF 65%; possible coronary artery spasm  . Hyperlipidemia    Lipid profile in 11/2008:282, 176, 64, 201  . Polio 1946    at age 34  . Peripheral neuropathy   . Anxiety   . Hypertension     diastolic dysfunction; normal CMet and TSH in 11/2009; normal CBC in 04/2010  . GERD (gastroesophageal reflux disease)     hiatal hernia  . Carcinoma of breast 2001    Left mastectomy with positive nodes  . Rectal bleed   . Chest pain     Long-standing and atypical  . Atrial fibrillation     Paroxysmal on event recorder in 2009; regular supraventricular tachycardia at a rate of 150 also identified on that study representing either atrial flutter or PSVT; onset of persistent AF in 03/2011  . Chronic anticoagulation 04/12/2011  . Renal insufficiency     ROS:   All systems reviewed and negative except as noted in the HPI.   Past Surgical History  Procedure Laterality Date  . Mastectomy      Left;modified radical with lymph node dissection  . Cholecystectomy  1999  . Colonoscopy  02/22/2011    Procedure: COLONOSCOPY;  Surgeon: Shelby Hippo, MD;  [facility];  Service: Endoscopy;  Laterality: N/A;; performed for scant hematochezia  . Esophagogastroduodenoscopy  02/22/2011    Procedure: ESOPHAGOGASTRODUODENOSCOPY (EGD);  Surgeon: Shelby Hippo, MD;  [facility];  Service: Endoscopy;  Laterality: N/A;     Family History  Problem Relation Age of Onset  . Cancer Sister     lung  . Cancer Brother     lipoma  . Cancer Brother     kidney     History   Social History  . Marital Status: Widowed    Spouse Name: N/A    Number of Children: 2  . Years of Education: N/A   Occupational History  . homemaker    Social History Main Topics  . Smoking status: Never Smoker   . Smokeless tobacco: Never Used  . Alcohol Use: No  . Drug Use: No  . Sexual Activity: Not Currently   Other Topics Concern  . Not on file   Social History Narrative  . No narrative on file     BP 108/68  Pulse 75  Ht 5' 1.5" (1.562 m)  Wt 185 lb  1.3 oz (83.952 kg)  BMI 34.41 kg/m2  Physical Exam:  Well appearing 79 year old woman,NAD HEENT: Unremarkable Neck:  No JVD, no thyromegally Back:  No CVA tenderness Lungs:  Clear with no wheezes, rales, or rhonchi. HEART:  IRegular rate rhythm, no murmurs, no rubs, no clicks Abd:  soft, obese, positive bowel sounds, no organomegally, no rebound, no guarding Ext:  2 plus pulses, no edema, no cyanosis, no clubbing Skin:  No rashes no nodules Neuro:  CN II through XII intact, motor grossly intact  EKG Atrial fibrillation with a controlled ventricular response.  Assess/Plan:

## 2013-05-15 NOTE — Assessment & Plan Note (Signed)
I discussed the treatment options with the patient in detail. Her options for rhythm control or few. Flecainide no longer works to control her rhythm. Dofetilide would likely be ineffective as her QT interval is already increased. For this reason sotalol would also not be likely to be effective. Amiodarone would have the best chance at controlling her rhythm. I would anticipate starting for her milligrams a day for approximately 4-5 weeks prior to cardioversion. In addition, the patient would like to switch back to her prior anticoagulation regimen with pradaxa and discontinue warfarin. She will stop warfarin prior to her breast surgery and restart Pradaxa after breast surgery. we will plan on cardioversion in early January.

## 2013-05-15 NOTE — Assessment & Plan Note (Signed)
Her blood pressure is well controlled. No change in medical therapy. She will maintain a low sodium diet. 

## 2013-05-15 NOTE — Patient Instructions (Addendum)
Your physician recommends that you schedule a follow-up appointment in: in March  Start Amiodarone 200 mg twice a day after surgery. STOP coumadin 5 days prior to surgery. After surgery START pradaxa. DO NOT TAKE COUMDIN  Your physician has requested that you have a TEE/Cardioversion. During a TEE, sound waves are used to create images of your heart. It provides your doctor with information about the size and shape of your heart and how well your heart's chambers and valves are working. In this test, a transducer is attached to the end of a flexible tube that is guided down you throat and into your esophagus (the tube leading from your mouth to your stomach) to get a more detailed image of your heart. Once the TEE has determined that a blood clot is not present, the cardioversion begins. Electrical Cardioversion uses a jolt of electricity to your heart either through paddles or wired patches attached to your chest. This is a controlled, usually prescheduled, procedure. This procedure is done at the hospital and you are not awake during the procedure. You usually go home the day of the procedure. Please see the instruction sheet given to you today for more information.

## 2013-05-19 ENCOUNTER — Encounter (HOSPITAL_BASED_OUTPATIENT_CLINIC_OR_DEPARTMENT_OTHER): Payer: Self-pay | Admitting: *Deleted

## 2013-05-19 NOTE — Progress Notes (Signed)
To go to [facility] 05/22/13 for pt ptt bmet-had ekg dr Ladona Ridgel 11/14-clearance for surgery

## 2013-05-22 ENCOUNTER — Encounter (HOSPITAL_COMMUNITY)
Admission: RE | Admit: 2013-05-22 | Discharge: 2013-05-22 | Disposition: A | Discharge status: Home / Self Care | Payer: Medicare Other | Source: Ambulatory Visit | Attending: Surgery | Admitting: Surgery

## 2013-05-22 DIAGNOSIS — Z01812 Encounter for preprocedural laboratory examination: Secondary | ICD-10-CM | POA: Insufficient documentation

## 2013-05-22 LAB — BASIC METABOLIC PANEL
BUN: 17 mg/dL (ref 6–23)
CO2: 28 mEq/L (ref 19–32)
Calcium: 9.7 mg/dL (ref 8.4–10.5)
Chloride: 99 mEq/L (ref 96–112)
GFR calc non Af Amer: 58 mL/min — ABNORMAL LOW (ref >90)
Glucose, Bld: 109 mg/dL — ABNORMAL HIGH (ref 70–99)
Potassium: 4.3 mEq/L (ref 3.5–5.1)
Sodium: 136 mEq/L (ref 135–145)

## 2013-05-22 LAB — PROTIME-INR
INR: 1.33 (ref 0.00–1.49)
Prothrombin Time: 16.2 seconds — ABNORMAL HIGH (ref 11.6–15.2)

## 2013-05-22 LAB — APTT: aPTT: 31 seconds (ref 24–37)

## 2013-05-23 ENCOUNTER — Encounter (HOSPITAL_BASED_OUTPATIENT_CLINIC_OR_DEPARTMENT_OTHER): Payer: Self-pay | Admitting: *Deleted

## 2013-05-23 ENCOUNTER — Ambulatory Visit (HOSPITAL_BASED_OUTPATIENT_CLINIC_OR_DEPARTMENT_OTHER): Admission: RE | Admit: 2013-05-23 | Payer: Medicare Other | Source: Ambulatory Visit | Admitting: Surgery

## 2013-05-23 ENCOUNTER — Encounter (HOSPITAL_COMMUNITY): Payer: Medicare Other

## 2013-05-23 SURGERY — BREAST LUMPECTOMY WITH NEEDLE LOCALIZATION AND AXILLARY SENTINEL LYMPH NODE BX
Anesthesia: General | Laterality: Right

## 2013-05-23 MED ORDER — METHYLENE BLUE 1 % INJ SOLN
INTRAMUSCULAR | Status: AC
  Filled 2013-05-23: qty 10

## 2013-05-23 MED ORDER — SODIUM CHLORIDE 0.9 % IJ SOLN
INTRAMUSCULAR | Status: AC
  Filled 2013-05-23: qty 10

## 2013-05-23 MED ORDER — BUPIVACAINE-EPINEPHRINE PF 0.5-1:200000 % IJ SOLN
INTRAMUSCULAR | Status: AC
  Filled 2013-05-23: qty 30

## 2013-05-23 NOTE — Progress Notes (Signed)
Pt was to have surgery today-she was not ordered a NL-I did call the [facility] office about this and they had said no order for that-now r/s to be done with NL . Reviewed preop instructions with pt,

## 2013-05-27 ENCOUNTER — Ambulatory Visit (HOSPITAL_BASED_OUTPATIENT_CLINIC_OR_DEPARTMENT_OTHER): Payer: Medicare Other | Admitting: Anesthesiology

## 2013-05-27 ENCOUNTER — Other Ambulatory Visit (INDEPENDENT_AMBULATORY_CARE_PROVIDER_SITE_OTHER): Payer: Self-pay | Admitting: Surgery

## 2013-05-27 ENCOUNTER — Ambulatory Visit (HOSPITAL_BASED_OUTPATIENT_CLINIC_OR_DEPARTMENT_OTHER)
Admission: RE | Admit: 2013-05-27 | Discharge: 2013-05-27 | Disposition: A | Discharge status: Home / Self Care | Payer: Medicare Other | Source: Ambulatory Visit | Attending: Surgery | Admitting: Surgery

## 2013-05-27 ENCOUNTER — Encounter (HOSPITAL_BASED_OUTPATIENT_CLINIC_OR_DEPARTMENT_OTHER): Payer: Medicare Other | Admitting: Anesthesiology

## 2013-05-27 ENCOUNTER — Ambulatory Visit
Admission: RE | Admit: 2013-05-27 | Discharge: 2013-05-27 | Disposition: A | Discharge status: Home / Self Care | Payer: PRIVATE HEALTH INSURANCE | Source: Ambulatory Visit | Attending: Surgery | Admitting: Surgery

## 2013-05-27 ENCOUNTER — Encounter (HOSPITAL_BASED_OUTPATIENT_CLINIC_OR_DEPARTMENT_OTHER)
Admission: RE | Disposition: A | Discharge status: Home / Self Care | Payer: Self-pay | Source: Ambulatory Visit | Attending: Surgery

## 2013-05-27 ENCOUNTER — Ambulatory Visit (HOSPITAL_COMMUNITY)
Admission: RE | Admit: 2013-05-27 | Discharge: 2013-05-27 | Disposition: A | Discharge status: Home / Self Care | Payer: Medicare Other | Source: Ambulatory Visit | Attending: Surgery | Admitting: Surgery

## 2013-05-27 ENCOUNTER — Encounter (HOSPITAL_BASED_OUTPATIENT_CLINIC_OR_DEPARTMENT_OTHER): Payer: Self-pay | Admitting: *Deleted

## 2013-05-27 DIAGNOSIS — C50911 Malignant neoplasm of unspecified site of right female breast: Secondary | ICD-10-CM

## 2013-05-27 DIAGNOSIS — C50912 Malignant neoplasm of unspecified site of left female breast: Secondary | ICD-10-CM

## 2013-05-27 DIAGNOSIS — C773 Secondary and unspecified malignant neoplasm of axilla and upper limb lymph nodes: Secondary | ICD-10-CM | POA: Insufficient documentation

## 2013-05-27 DIAGNOSIS — Z853 Personal history of malignant neoplasm of breast: Secondary | ICD-10-CM | POA: Insufficient documentation

## 2013-05-27 DIAGNOSIS — Z9010 Acquired absence of unspecified breast and nipple: Secondary | ICD-10-CM | POA: Insufficient documentation

## 2013-05-27 DIAGNOSIS — C50919 Malignant neoplasm of unspecified site of unspecified female breast: Secondary | ICD-10-CM | POA: Insufficient documentation

## 2013-05-27 DIAGNOSIS — D0590 Unspecified type of carcinoma in situ of unspecified breast: Secondary | ICD-10-CM | POA: Insufficient documentation

## 2013-05-27 DIAGNOSIS — Z8612 Personal history of poliomyelitis: Secondary | ICD-10-CM | POA: Insufficient documentation

## 2013-05-27 DIAGNOSIS — K219 Gastro-esophageal reflux disease without esophagitis: Secondary | ICD-10-CM | POA: Insufficient documentation

## 2013-05-27 DIAGNOSIS — Z7901 Long term (current) use of anticoagulants: Secondary | ICD-10-CM | POA: Insufficient documentation

## 2013-05-27 HISTORY — DX: Malignant neoplasm of unspecified site of unspecified female breast: C50.919

## 2013-05-27 HISTORY — PX: BREAST LUMPECTOMY WITH NEEDLE LOCALIZATION AND AXILLARY SENTINEL LYMPH NODE BX: SHX5760

## 2013-05-27 SURGERY — BREAST LUMPECTOMY WITH NEEDLE LOCALIZATION AND AXILLARY SENTINEL LYMPH NODE BX
Anesthesia: General | Site: Breast | Laterality: Right | Wound class: Clean

## 2013-05-27 MED ORDER — ONDANSETRON HCL 4 MG/2ML IJ SOLN
INTRAMUSCULAR | Status: DC | PRN
  Administered 2013-05-27: 4 mg via INTRAVENOUS

## 2013-05-27 MED ORDER — MIDAZOLAM HCL 2 MG/2ML IJ SOLN
INTRAMUSCULAR | Status: AC
  Filled 2013-05-27: qty 2

## 2013-05-27 MED ORDER — CHLORHEXIDINE GLUCONATE 4 % EX LIQD: 1.0000 "application " | Freq: Once | CUTANEOUS | Status: DC

## 2013-05-27 MED ORDER — BUPIVACAINE HCL 0.5 % IJ SOLN
INTRAMUSCULAR | Status: DC | PRN
  Administered 2013-05-27: 8 mL

## 2013-05-27 MED ORDER — OXYCODONE-ACETAMINOPHEN 5-325 MG PO TABS: 1.0000 | ORAL_TABLET | Freq: Four times a day (QID) | ORAL | Status: DC | PRN

## 2013-05-27 MED ORDER — PROPOFOL 10 MG/ML IV BOLUS
INTRAVENOUS | Status: DC | PRN
  Administered 2013-05-27: 100 mg via INTRAVENOUS

## 2013-05-27 MED ORDER — HEPARIN SODIUM (PORCINE) 5000 UNIT/ML IJ SOLN: 5000.0000 [IU] | Freq: Once | INTRAMUSCULAR | Status: DC

## 2013-05-27 MED ORDER — CEFAZOLIN SODIUM-DEXTROSE 2-3 GM-% IV SOLR
2.0000 g | INTRAVENOUS | Status: AC
  Administered 2013-05-27: 2 g via INTRAVENOUS

## 2013-05-27 MED ORDER — FENTANYL CITRATE 0.05 MG/ML IJ SOLN
INTRAMUSCULAR | Status: AC
  Filled 2013-05-27: qty 4

## 2013-05-27 MED ORDER — PROPOFOL 10 MG/ML IV BOLUS
INTRAVENOUS | Status: AC
  Filled 2013-05-27: qty 20

## 2013-05-27 MED ORDER — PROMETHAZINE HCL 25 MG/ML IJ SOLN
6.2500 mg | Freq: Once | INTRAMUSCULAR | Status: AC
  Administered 2013-05-27: 6.25 mg via INTRAVENOUS

## 2013-05-27 MED ORDER — FENTANYL CITRATE 0.05 MG/ML IJ SOLN
INTRAMUSCULAR | Status: DC | PRN
  Administered 2013-05-27: 25 ug via INTRAVENOUS
  Administered 2013-05-27: 50 ug via INTRAVENOUS
  Administered 2013-05-27: 25 ug via INTRAVENOUS

## 2013-05-27 MED ORDER — DEXAMETHASONE SODIUM PHOSPHATE 4 MG/ML IJ SOLN
INTRAMUSCULAR | Status: DC | PRN
  Administered 2013-05-27: 5 mg via INTRAVENOUS

## 2013-05-27 MED ORDER — TECHNETIUM TC 99M SULFUR COLLOID FILTERED
1.0000 | Freq: Once | INTRAVENOUS | Status: AC | PRN
  Administered 2013-05-27: 1 via INTRADERMAL

## 2013-05-27 MED ORDER — FENTANYL CITRATE 0.05 MG/ML IJ SOLN
50.0000 ug | INTRAMUSCULAR | Status: DC | PRN
  Administered 2013-05-27: 50 ug via INTRAVENOUS

## 2013-05-27 MED ORDER — FENTANYL CITRATE 0.05 MG/ML IJ SOLN
INTRAMUSCULAR | Status: AC
  Filled 2013-05-27: qty 2

## 2013-05-27 MED ORDER — METHYLENE BLUE 1 % INJ SOLN
INTRAMUSCULAR | Status: AC
  Filled 2013-05-27: qty 10

## 2013-05-27 MED ORDER — HYDROMORPHONE HCL PF 1 MG/ML IJ SOLN
0.2500 mg | INTRAMUSCULAR | Status: DC | PRN
  Administered 2013-05-27 (×2): 0.5 mg via INTRAVENOUS

## 2013-05-27 MED ORDER — HEPARIN SODIUM (PORCINE) 5000 UNIT/ML IJ SOLN
INTRAMUSCULAR | Status: AC
  Filled 2013-05-27: qty 1

## 2013-05-27 MED ORDER — MIDAZOLAM HCL 2 MG/2ML IJ SOLN
1.0000 mg | INTRAMUSCULAR | Status: DC | PRN
  Administered 2013-05-27: 1 mg via INTRAVENOUS

## 2013-05-27 MED ORDER — MIDAZOLAM HCL 5 MG/5ML IJ SOLN
INTRAMUSCULAR | Status: DC | PRN
  Administered 2013-05-27: 2 mg via INTRAVENOUS

## 2013-05-27 MED ORDER — PROMETHAZINE HCL 25 MG/ML IJ SOLN
INTRAMUSCULAR | Status: AC
  Filled 2013-05-27: qty 1

## 2013-05-27 MED ORDER — BUPIVACAINE HCL (PF) 0.5 % IJ SOLN
INTRAMUSCULAR | Status: AC
  Filled 2013-05-27: qty 30

## 2013-05-27 MED ORDER — LACTATED RINGERS IV SOLN
INTRAVENOUS | Status: DC
  Administered 2013-05-27 (×2): via INTRAVENOUS

## 2013-05-27 MED ORDER — CEFAZOLIN SODIUM-DEXTROSE 2-3 GM-% IV SOLR: 2.0000 g | INTRAVENOUS | Status: DC

## 2013-05-27 MED ORDER — HEPARIN SODIUM (PORCINE) 5000 UNIT/ML IJ SOLN
5000.0000 [IU] | Freq: Once | INTRAMUSCULAR | Status: AC
  Administered 2013-05-27: 5000 [IU] via SUBCUTANEOUS

## 2013-05-27 MED ORDER — LIDOCAINE HCL (CARDIAC) 20 MG/ML IV SOLN
INTRAVENOUS | Status: DC | PRN
  Administered 2013-05-27: 60 mg via INTRAVENOUS

## 2013-05-27 MED ORDER — CEFAZOLIN SODIUM 1-5 GM-% IV SOLN
INTRAVENOUS | Status: AC
  Filled 2013-05-27: qty 100

## 2013-05-27 MED ORDER — HYDROMORPHONE HCL PF 1 MG/ML IJ SOLN
INTRAMUSCULAR | Status: AC
  Filled 2013-05-27: qty 1

## 2013-05-27 MED ORDER — SODIUM CHLORIDE 0.9 % IJ SOLN
INTRAMUSCULAR | Status: AC
  Filled 2013-05-27: qty 10

## 2013-05-27 SURGICAL SUPPLY — 51 items
APPLIER CLIP 11 MED OPEN (CLIP) ×2
BENZOIN TINCTURE PRP APPL 2/3 (GAUZE/BANDAGES/DRESSINGS) ×2 IMPLANT
BINDER BREAST LRG (GAUZE/BANDAGES/DRESSINGS) ×2 IMPLANT
BLADE HEX COATED 2.75 (ELECTRODE) ×2 IMPLANT
BLADE SURG 10 STRL SS (BLADE) IMPLANT
BLADE SURG 15 STRL LF DISP TIS (BLADE) ×2 IMPLANT
BLADE SURG 15 STRL SS (BLADE) ×2
CANISTER SUCT 1200ML W/VALVE (MISCELLANEOUS) ×2 IMPLANT
CLIP APPLIE 11 MED OPEN (CLIP) ×1 IMPLANT
CLIP TI WIDE RED SMALL 6 (CLIP) IMPLANT
COVER MAYO STAND STRL (DRAPES) ×2 IMPLANT
COVER PROBE W GEL 5X96 (DRAPES) ×2 IMPLANT
COVER TABLE BACK 60X90 (DRAPES) ×2 IMPLANT
DECANTER SPIKE VIAL GLASS SM (MISCELLANEOUS) IMPLANT
DERMABOND ADVANCED (GAUZE/BANDAGES/DRESSINGS) ×1
DERMABOND ADVANCED .7 DNX12 (GAUZE/BANDAGES/DRESSINGS) ×1 IMPLANT
DEVICE DUBIN W/COMP PLATE 8390 (MISCELLANEOUS) ×2 IMPLANT
DRAIN HEMOVAC 1/8 X 5 (WOUND CARE) IMPLANT
DRAPE LAPAROSCOPIC ABDOMINAL (DRAPES) ×2 IMPLANT
ELECT REM PT RETURN 9FT ADLT (ELECTROSURGICAL) ×2
ELECTRODE REM PT RTRN 9FT ADLT (ELECTROSURGICAL) ×1 IMPLANT
EVACUATOR SILICONE 100CC (DRAIN) IMPLANT
GAUZE SPONGE 4X4 12PLY STRL LF (GAUZE/BANDAGES/DRESSINGS) ×4 IMPLANT
GLOVE BIO SURGEON STRL SZ8 (GLOVE) ×2 IMPLANT
GLOVE SURG SS PI 7.0 STRL IVOR (GLOVE) ×2 IMPLANT
GOWN PREVENTION PLUS XLARGE (GOWN DISPOSABLE) ×2 IMPLANT
GOWN PREVENTION PLUS XXLARGE (GOWN DISPOSABLE) ×2 IMPLANT
KIT MARKER MARGIN INK (KITS) ×2 IMPLANT
NDL SAFETY ECLIPSE 18X1.5 (NEEDLE) ×1 IMPLANT
NEEDLE HYPO 18GX1.5 SHARP (NEEDLE) ×1
NEEDLE HYPO 25X1 1.5 SAFETY (NEEDLE) ×4 IMPLANT
NS IRRIG 1000ML POUR BTL (IV SOLUTION) ×2 IMPLANT
PACK BASIN DAY SURGERY FS (CUSTOM PROCEDURE TRAY) ×2 IMPLANT
PAD ALCOHOL SWAB (MISCELLANEOUS) ×2 IMPLANT
PENCIL BUTTON HOLSTER BLD 10FT (ELECTRODE) ×2 IMPLANT
PIN SAFETY STERILE (MISCELLANEOUS) IMPLANT
SLEEVE SCD COMPRESS KNEE MED (MISCELLANEOUS) ×2 IMPLANT
SPONGE LAP 18X18 X RAY DECT (DISPOSABLE) IMPLANT
SPONGE LAP 4X18 X RAY DECT (DISPOSABLE) ×2 IMPLANT
STAPLER VISISTAT 35W (STAPLE) ×2 IMPLANT
STRIP CLOSURE SKIN 1/2X4 (GAUZE/BANDAGES/DRESSINGS) ×2 IMPLANT
SUT ETHILON 3 0 FSL (SUTURE) IMPLANT
SUT SILK 2 0 SH (SUTURE) ×2 IMPLANT
SUT VIC AB 4-0 BRD 54 (SUTURE) IMPLANT
SUT VIC AB 4-0 SH 18 (SUTURE) ×2 IMPLANT
SUT VICRYL 4-0 PS2 18IN ABS (SUTURE) IMPLANT
SYR CONTROL 10ML LL (SYRINGE) ×4 IMPLANT
TOWEL OR 17X24 6PK STRL BLUE (TOWEL DISPOSABLE) ×4 IMPLANT
TRAY DSU PREP LF (CUSTOM PROCEDURE TRAY) ×2 IMPLANT
TUBE CONNECTING 20X1/4 (TUBING) ×2 IMPLANT
YANKAUER SUCT BULB TIP NO VENT (SUCTIONS) ×2 IMPLANT

## 2013-05-27 NOTE — Anesthesia Postprocedure Evaluation (Signed)
  Anesthesia Post-op Note  Patient: [name]  Procedure(s) Performed: Procedure(s): BREAST LUMPECTOMY WITH NEEDLE LOCALIZATION AND AXILLARY SENTINEL LYMPH NODE BX (Right)  Patient Location: PACU  Anesthesia Type:General  Level of Consciousness: awake and alert   Airway and Oxygen Therapy: Patient Spontanous Breathing  Post-op Pain: mild  Post-op Assessment: Post-op Vital signs reviewed, Patient's Cardiovascular Status Stable and Respiratory Function Stable  Post-op Vital Signs: Reviewed  Filed Vitals:   05/27/13 1415  BP: 103/73  Pulse: 79  Temp:   Resp: 14    Complications: No apparent anesthesia complications

## 2013-05-27 NOTE — Transfer of Care (Signed)
Immediate Anesthesia Transfer of Care Note  Patient: [name]  Procedure(s) Performed: Procedure(s): BREAST LUMPECTOMY WITH NEEDLE LOCALIZATION AND AXILLARY SENTINEL LYMPH NODE BX (Right)  Patient Location: PACU  Anesthesia Type:General  Level of Consciousness: awake, alert  and oriented  Airway & Oxygen Therapy: Patient Spontanous Breathing and Patient connected to face mask oxygen  Post-op Assessment: Report given to PACU RN and Post -op Vital signs reviewed and stable  Post vital signs: Reviewed and stable  Complications: No apparent anesthesia complications

## 2013-05-27 NOTE — Op Note (Signed)
Surgeon: Wenda Low, MD, FACS  Asst:  none  Anes:  General   Procedure: Right breast sentinel node biopsy; right breast lumpectomy  Diagnosis: Right breast cancer  Complications: none  EBL:   20 cc  Description of Procedure:  The patient was taken to room 1 at [facility] day surgery after prior needle localization of the lesion in her right breast and injection by nuclear medicine. The breast was injected with methylene blue after a timeout was performed. The breast was prepped with PCMX and draped sterilely. First I mapped her right axilla and found a hot spot. Made a transverse incision and carried this down deep and found a firm node which I dissected free using clips to isolate the vascular supply and harvested it has a first hot but not blue node. It was suspicious for containing metastatic cancer. I felt no other obvious palpable nodes. Second hot lesion was found and dissected free a little deeper and posterior and it was blue and hot. It was also sent as sentinel lymph node. No other radiation background was noted.  The wire exited an area of scar in the right nipple. Patient's previously had a left mastectomy. I went ahead and excise a transverse ellipse and eventually put a suture on the lower portion or inferior were also the nipple margin of the transverse incision. I used the electrocautery to create superior and inferior flaps getting widely around what I perceived to be a mass effect that was more prominent than when I see him in the office more closely for biopsy early secondary to edema. I removed the mass in toto including the wire and get good margins around it. I then inked the margins using the margin can't as well as left a suture on the inferior portion of the skin again that was closest to the nipple. I irrigated and no bleeding was noted. Approximate some of the breast tissue deeply with 4-0 Vicryl's and closed all wounds with 4-0 Vicryl. Benzoin and Steri-Strips are used on the  breast and Dermabond was used in the axilla. Patient was taken recovery room in satisfactory condition.  Matt B. Daphine Deutscher, MD, [facility], [place] [phone]

## 2013-05-27 NOTE — Progress Notes (Signed)
Transferred to phase 2 .  Continued to use 2l nasal cannula to maintain sats in the mid 90s

## 2013-05-27 NOTE — Anesthesia Preprocedure Evaluation (Addendum)
Anesthesia Evaluation  Patient identified by MRN, date of birth, ID band Patient awake    Reviewed: Allergy & Precautions, H&P , NPO status , Patient's Chart, lab work & pertinent test results  Airway Mallampati: I TM Distance: >3 FB Neck ROM: Full    Dental no notable dental hx. (+) Teeth Intact and Dental Advisory Given   Pulmonary neg pulmonary ROS,  breath sounds clear to auscultation  Pulmonary exam normal       Cardiovascular hypertension, On Medications + dysrhythmias Atrial Fibrillation Rhythm:Irregular Rate:Normal     Neuro/Psych negative neurological ROS  negative psych ROS   GI/Hepatic Neg liver ROS, GERD-  Medicated and Controlled,  Endo/Other  negative endocrine ROS  Renal/GU Renal disease  negative genitourinary   Musculoskeletal   Abdominal   Peds  Hematology negative hematology ROS (+)   Anesthesia Other Findings   Reproductive/Obstetrics negative OB ROS                          Anesthesia Physical Anesthesia Plan  ASA: III  Anesthesia Plan: General   Post-op Pain Management:    Induction: Intravenous  Airway Management Planned: LMA  Additional Equipment:   Intra-op Plan:   Post-operative Plan: Extubation in OR  Informed Consent: I have reviewed the patients History and Physical, chart, labs and discussed the procedure including the risks, benefits and alternatives for the proposed anesthesia with the patient or authorized representative who has indicated his/her understanding and acceptance.   Dental advisory given  Plan Discussed with: CRNA  Anesthesia Plan Comments:         Anesthesia Quick Evaluation

## 2013-05-27 NOTE — H&P (View-Only) (Signed)
Chief Complaint:  Right breast cancer  History of Present Illness:  [name] is an 79 y.o. female who had a previous left mastectomy by Dr. Jamey Ripa in early 2000. I subsequently took care of her husband who had complicated hernia repair requiring prolonged hospitalization with a long and he subsequently died from metastatic lung cancer. She had a recent mammogram which showed a new lesion in her right breast which on biopsy is invasive carcinoma with surrounding DCIS. She has a pretty marked sized hematoma in this biopsy site. I reviewed her MR scan. I asked her what she was leaning toward and she would like to have a lumpectomy. I think what we will do is look toward doing needle localization of the mass with sentinel node biopsy.    Past Medical History  Diagnosis Date  . Herpes zoster   . Arteriosclerotic cardiovascular disease (ASCVD)      cath Feb '07- no obstructive dz - 20% proximal LAD only; EF 65%; possible coronary artery spasm  . Hyperlipidemia     Lipid profile in 11/2008:282, 176, 64, 201  . Polio 1946    at age 55  . Peripheral neuropathy   . Anxiety   . Hypertension     diastolic dysfunction; normal CMet and TSH in 11/2009; normal CBC in 04/2010  . GERD (gastroesophageal reflux disease)     hiatal hernia  . Carcinoma of breast 2001    Left mastectomy with positive nodes  . Rectal bleed   . Chest pain     Long-standing and atypical  . Atrial fibrillation     Paroxysmal on event recorder in 2009; regular supraventricular tachycardia at a rate of 150 also identified on that study representing either atrial flutter or PSVT; onset of persistent AF in 03/2011  . Chronic anticoagulation 04/12/2011  . Renal insufficiency     Past Surgical History  Procedure Laterality Date  . Mastectomy      Left;modified radical with lymph node dissection  . Cholecystectomy  1999  . Colonoscopy  02/22/2011    Procedure: COLONOSCOPY;  Surgeon: Malissa Hippo, MD;  [facility];   Service: Endoscopy;  Laterality: N/A;; performed for scant hematochezia  . Esophagogastroduodenoscopy  02/22/2011    Procedure: ESOPHAGOGASTRODUODENOSCOPY (EGD);  Surgeon: Malissa Hippo, MD;  [facility];  Service: Endoscopy;  Laterality: N/A;    Current Outpatient Prescriptions  Medication Sig Dispense Refill  . furosemide (LASIX) 40 MG tablet Take 1 tablet (40 mg total) by mouth daily.  30 tablet  3  . meclizine (ANTIVERT) 25 MG tablet Take 25 mg by mouth 3 (three) times daily as needed.      . potassium chloride SA (K-DUR,KLOR-CON) 20 MEQ tablet Take 1 tablet (20 mEq total) by mouth daily.  30 tablet  3  . pravastatin (PRAVACHOL) 20 MG tablet TAKE 1 TABLET EVERY DAY  30 tablet  10  . ramipril (ALTACE) 2.5 MG tablet Take 2.5 mg by mouth daily.      . ranitidine (ZANTAC) 150 MG tablet Take 150 mg by mouth at bedtime.      . verapamil (VERELAN PM) 180 MG 24 hr capsule Take 180 mg by mouth 2 (two) times daily.       Marland Kitchen warfarin (COUMADIN) 5 MG tablet Take 1 tablet (5 mg total) by mouth as directed.  45 tablet  3   No current facility-administered medications for this visit.   Atorvastatin and Hydrocodone Family History  Problem Relation  Age of Onset  . Cancer Sister     lung  . Cancer Brother     lipoma  . Cancer Brother     kidney   Social History:   reports that she has never smoked. She has never used smokeless tobacco. She reports that she does not drink alcohol or use illicit drugs.   REVIEW OF SYSTEMS - PERTINENT POSITIVES ONLY: She had chemotherapy previously. Her old chart is not available to me. She is followed by Dr. Carylon Perches.  Physical Exam:   Blood pressure 120/78, pulse 64, temperature 98.2 F (36.8 C), temperature source Temporal, resp. rate 14, height 5' 0.5" (1.537 m), weight 182 lb 12.8 oz (82.918 kg). Body mass index is 35.1 kg/(m^2).  Gen:  WDWN white female NAD  Neurological: Alert and oriented to person, place, and time. Motor and sensory  function is grossly intact  Head: Normocephalic and atraumatic.  Eyes: Conjunctivae are normal. Pupils are equal, round, and reactive to light. No scleral icterus.  Neck: Normal range of motion. Neck supple. No tracheal deviation or thyromegaly present.  Cardiovascular:  SR without murmurs or gallops.  No carotid bruits Respiratory: Effort normal.  No respiratory distress. No chest wall tenderness. Breath sounds normal.  No wheezes, rales or rhonchi.  Abdomen:  nontender GU: Musculoskeletal: Normal range of motion. Extremities are nontender. No cyanosis, edema or clubbing noted Lymphadenopathy: No cervical, preauricular, postauricular or axillary adenopathy is present Skin: Skin is warm and dry. No rash noted. No diaphoresis. No erythema. No pallor. Pscyh: Normal mood and affect. Behavior is normal. Judgment and thought content normal.   LABORATORY RESULTS: No results found for this or any previous visit (from the past 48 hour(s)).  RADIOLOGY RESULTS: No results found.  Problem List: Patient Active Problem List   Diagnosis Date Noted  . Wrist fracture, closed 12/10/2012  . Triquetral fracture 10/29/2012  . Sprain of right wrist 10/29/2012  . Chronic anticoagulation 04/12/2011  . Atrial fibrillation 03/22/2011  . Arteriosclerotic cardiovascular disease (ASCVD)   . Hyperlipidemia   . Polio   . Hypertension   . GERD (gastroesophageal reflux disease)   . Carcinoma of breast   . HERPES ZOSTER 11/12/2008  . ANXIETY 07/24/2007  . PERIPHERAL NEUROPATHY 07/24/2007    Assessment & Plan: New right breast cancer the patient has previously had a left mastectomy. We'll move toward needle lobe right lumpectomy and sentinel lymph node biopsy.    Matt B. Daphine Deutscher, MD, [facility] [phone] beeper [phone]  05/09/2013 12:33 PM

## 2013-05-27 NOTE — Interval H&P Note (Signed)
History and Physical Interval Note:  05/27/2013 10:36 AM  [name]  has presented today for surgery, with the diagnosis of right breast cancer  The various methods of treatment have been discussed with the patient and family. After consideration of risks, benefits and other options for treatment, the patient has consented to  Procedure(s): BREAST LUMPECTOMY WITH NEEDLE LOCALIZATION AND AXILLARY SENTINEL LYMPH NODE BX (Right) as a surgical intervention .  The patient's history has been reviewed, patient examined, no change in status, stable for surgery.  I have reviewed the patient's chart and labs.  Questions were answered to the patient's satisfaction.     Caterin Tabares B

## 2013-05-27 NOTE — Anesthesia Procedure Notes (Signed)
Procedure Name: LMA Insertion Date/Time: 05/27/2013 10:56 AM Performed by: Burna Cash Pre-anesthesia Checklist: Patient identified, Emergency Drugs available, Suction available and Patient being monitored Patient Re-evaluated:Patient Re-evaluated prior to inductionOxygen Delivery Method: Circle System Utilized Preoxygenation: Pre-oxygenation with 100% oxygen Intubation Type: IV induction Ventilation: Mask ventilation without difficulty LMA: LMA inserted LMA Size: 4.0 Number of attempts: 1 Airway Equipment and Method: bite block Placement Confirmation: positive ETCO2 Tube secured with: Tape Dental Injury: Teeth and Oropharynx as per pre-operative assessment

## 2013-05-28 ENCOUNTER — Telehealth (INDEPENDENT_AMBULATORY_CARE_PROVIDER_SITE_OTHER): Payer: Self-pay

## 2013-05-28 ENCOUNTER — Encounter (HOSPITAL_BASED_OUTPATIENT_CLINIC_OR_DEPARTMENT_OTHER): Payer: Self-pay | Admitting: Surgery

## 2013-05-28 ENCOUNTER — Other Ambulatory Visit: Payer: Self-pay | Admitting: Cardiology

## 2013-05-28 NOTE — Telephone Encounter (Signed)
Pt is s/p lumpectomy and SNL by Dr. Daphine Deutscher on 05/27/13.  She was told to schedule a post op in one week.  No appointments available.  Please call the patient.

## 2013-05-29 ENCOUNTER — Telehealth (INDEPENDENT_AMBULATORY_CARE_PROVIDER_SITE_OTHER): Payer: Self-pay

## 2013-05-29 NOTE — Telephone Encounter (Signed)
Patient calling into office to schedule her 1st po appointment.  Unable to find an appointment.  Spoke to [facility] Dr. Ermalene Searing assistant and she will call patient with post op date & time.  Patient requesting pathology results. Pathology results not given to patient at this time.  Patient advised that I will send a message to Dr. Daphine Deutscher and Aundra Millet his assistant.

## 2013-06-02 ENCOUNTER — Telehealth (INDEPENDENT_AMBULATORY_CARE_PROVIDER_SITE_OTHER): Payer: Self-pay

## 2013-06-02 NOTE — Telephone Encounter (Signed)
Spoke with pt letting her know that I have scheduled her to come see Dr. Daphine Deutscher on Monday, December 1 @ 4pm.

## 2013-06-02 NOTE — Telephone Encounter (Signed)
LMOM letting pt know that I have scheduled her to come see Dr. Daphine Deutscher tomorrow 11/25 @ 345pm.

## 2013-06-03 ENCOUNTER — Ambulatory Visit (INDEPENDENT_AMBULATORY_CARE_PROVIDER_SITE_OTHER): Payer: Medicare Other | Admitting: Surgery

## 2013-06-03 ENCOUNTER — Encounter (INDEPENDENT_AMBULATORY_CARE_PROVIDER_SITE_OTHER): Payer: Self-pay

## 2013-06-03 ENCOUNTER — Encounter (INDEPENDENT_AMBULATORY_CARE_PROVIDER_SITE_OTHER): Payer: Self-pay | Admitting: Surgery

## 2013-06-03 VITALS — BP 140/90 | HR 100 | Temp 97.5°F | Resp 14 | Ht 61.5 in | Wt 187.0 lb

## 2013-06-03 DIAGNOSIS — C773 Secondary and unspecified malignant neoplasm of axilla and upper limb lymph nodes: Secondary | ICD-10-CM

## 2013-06-03 DIAGNOSIS — C50911 Malignant neoplasm of unspecified site of right female breast: Secondary | ICD-10-CM

## 2013-06-03 DIAGNOSIS — C50919 Malignant neoplasm of unspecified site of unspecified female breast: Secondary | ICD-10-CM

## 2013-06-03 MED ORDER — TAMOXIFEN CITRATE 20 MG PO TABS: 20.0000 mg | ORAL_TABLET | Freq: Every day | ORAL | Status: DC

## 2013-06-03 NOTE — Progress Notes (Signed)
Ilean Skill 79 y.o.  Body mass index is 34.77 kg/(m^2).  Patient Active Problem List   Diagnosis Date Noted  . Bilateral breast cancer 05/09/2013  . Wrist fracture, closed 12/10/2012  . Triquetral fracture 10/29/2012  . Sprain of right wrist 10/29/2012  . Chronic anticoagulation 04/12/2011  . Atrial fibrillation 03/22/2011  . Arteriosclerotic cardiovascular disease (ASCVD)   . Hyperlipidemia   . Polio   . Hypertension   . GERD (gastroesophageal reflux disease)   . Carcinoma of breast   . HERPES ZOSTER 11/12/2008  . ANXIETY 07/24/2007  . PERIPHERAL NEUROPATHY 07/24/2007    Allergies  Allergen Reactions  . Atorvastatin Other (See Comments)    Myalgias  . Hydrocodone Other (See Comments)    GI distress.    Past Surgical History  Procedure Laterality Date  . Cholecystectomy  1999  . Colonoscopy  02/22/2011    Procedure: COLONOSCOPY;  Surgeon: Malissa Hippo, MD;  [facility];  Service: Endoscopy;  Laterality: N/A;; performed for scant hematochezia  . Esophagogastroduodenoscopy  02/22/2011    Procedure: ESOPHAGOGASTRODUODENOSCOPY (EGD);  Surgeon: Malissa Hippo, MD;  [facility]: [facility];  Service: Endoscopy;  Laterality: N/A;  . Mastectomy  2001    Left;modified radical with lymph node dissection  . Port-a-cath removal      pac in and out 2002  . Dilation and curettage of uterus    . Eye surgery      both cataracts  . Breast lumpectomy with needle localization and axillary sentinel lymph node bx Right 05/27/2013    Procedure: BREAST LUMPECTOMY WITH NEEDLE LOCALIZATION AND AXILLARY SENTINEL LYMPH NODE BX;  Surgeon: Valarie Merino, MD;  Location: [facility];  Service: General;  Laterality: Right;   Carylon Perches, MD 1. Breast cancer metastasized to axillary lymph node, right     Postop visit;  Incision looks good.  No pain.  Axilla looks good.   1 postive node in axilla.  Margins around lumpectomy are good.  Discussed with Dr. Cyndie Chime;   I have ordered Tamoxifen 20 mg QD.  Pt advised about concomitant Warfarin therapy.  Overall, she looks good.  Will see back in 4 weeks and will make appointment with Dr. Reece Agar at some point in the next few weeks.    Matt B. Daphine Deutscher, MD, [facility] [phone] beeper [phone]  06/03/2013 4:21 PM

## 2013-06-03 NOTE — Patient Instructions (Signed)

## 2013-06-04 ENCOUNTER — Other Ambulatory Visit (INDEPENDENT_AMBULATORY_CARE_PROVIDER_SITE_OTHER): Payer: Self-pay

## 2013-06-04 DIAGNOSIS — C50911 Malignant neoplasm of unspecified site of right female breast: Secondary | ICD-10-CM

## 2013-06-06 ENCOUNTER — Other Ambulatory Visit: Payer: Self-pay | Admitting: Oncology

## 2013-06-09 ENCOUNTER — Encounter (INDEPENDENT_AMBULATORY_CARE_PROVIDER_SITE_OTHER): Payer: Medicare Other | Admitting: Surgery

## 2013-06-09 ENCOUNTER — Telehealth: Payer: Self-pay | Admitting: Oncology

## 2013-06-09 NOTE — Telephone Encounter (Signed)
S/w pt and gve np appt 12/18 @ 10 w/Dr. Cyndie Chime Calendar mailed.

## 2013-06-09 NOTE — Telephone Encounter (Signed)
C/D 06/09/13 for appt. 06/26/13

## 2013-06-20 ENCOUNTER — Ambulatory Visit: Payer: Medicare Other | Admitting: Urology

## 2013-06-23 ENCOUNTER — Other Ambulatory Visit: Payer: Self-pay | Admitting: Oncology

## 2013-06-23 ENCOUNTER — Ambulatory Visit: Payer: Medicare Other | Admitting: Radiation Oncology

## 2013-06-23 ENCOUNTER — Ambulatory Visit: Payer: Medicare Other

## 2013-06-23 DIAGNOSIS — C50912 Malignant neoplasm of unspecified site of left female breast: Secondary | ICD-10-CM

## 2013-06-23 DIAGNOSIS — C50911 Malignant neoplasm of unspecified site of right female breast: Secondary | ICD-10-CM

## 2013-06-24 ENCOUNTER — Telehealth: Payer: Self-pay | Admitting: Oncology

## 2013-06-24 ENCOUNTER — Encounter: Payer: Self-pay | Admitting: Radiation Oncology

## 2013-06-24 NOTE — Progress Notes (Signed)
Location of Breast Cancer:invasive ductal carcinoma of right breast with surrounding DCIS with one lymph node positive for metastatic mammary carcinoma  Histology per Pathology Report:     Receptor Status: ER(+), PR (+), Her2-neu (-)  Did patient present with symptoms (if so, please note symptoms) or was this found on screening mammography?: YES  Past/Anticipated interventions by surgeon, if ZOX:WRUEA breast lumpectomy with needle localization and axillary sentinel lymph node biopsy done 05/27/2013. Also, left modified radical mastectomy with lymph node dissection done 2001  Past/Anticipated interventions by medical oncology, if any: Chemotherapy:Received six cycles of taxotere, adriamycin and cytoxan in 2001  Lymphedema issues, if any:  no   Pain issues, if any:  no   SAFETY ISSUES:  Prior radiation? yes  Pacemaker/ICD? no  Possible current pregnancy?no  Is the patient on methotrexate? no  Current Complaints / other details:  79 year old female. Treated by Dr. Kathrynn Running in 2002 for left breast (stage IIA) cancer. Husband recently died of metastatic lung ca. Had a marked hematoma in biopsy site.     Ardell Isaacs, RN 06/24/2013,11:25 AM

## 2013-06-24 NOTE — Telephone Encounter (Signed)
s.w. pt and advised on 12.18.14 lab 9:30am...pt ok and awae

## 2013-06-25 ENCOUNTER — Encounter: Payer: Self-pay | Admitting: Radiation Oncology

## 2013-06-25 ENCOUNTER — Ambulatory Visit
Admission: RE | Admit: 2013-06-25 | Discharge: 2013-06-25 | Disposition: A | Discharge status: Home / Self Care | Payer: Medicare Other | Source: Ambulatory Visit | Attending: Radiation Oncology | Admitting: Radiation Oncology

## 2013-06-25 VITALS — BP 122/59 | HR 64 | Temp 98.8°F | Resp 20 | Ht 61.0 in | Wt 185.1 lb

## 2013-06-25 DIAGNOSIS — C773 Secondary and unspecified malignant neoplasm of axilla and upper limb lymph nodes: Secondary | ICD-10-CM | POA: Insufficient documentation

## 2013-06-25 DIAGNOSIS — C50211 Malignant neoplasm of upper-inner quadrant of right female breast: Secondary | ICD-10-CM | POA: Insufficient documentation

## 2013-06-25 DIAGNOSIS — C50911 Malignant neoplasm of unspecified site of right female breast: Secondary | ICD-10-CM

## 2013-06-25 DIAGNOSIS — E785 Hyperlipidemia, unspecified: Secondary | ICD-10-CM | POA: Insufficient documentation

## 2013-06-25 DIAGNOSIS — C50912 Malignant neoplasm of unspecified site of left female breast: Secondary | ICD-10-CM

## 2013-06-25 DIAGNOSIS — Z170 Estrogen receptor positive status [ER+]: Secondary | ICD-10-CM | POA: Insufficient documentation

## 2013-06-25 DIAGNOSIS — I10 Essential (primary) hypertension: Secondary | ICD-10-CM | POA: Insufficient documentation

## 2013-06-25 DIAGNOSIS — Z923 Personal history of irradiation: Secondary | ICD-10-CM | POA: Insufficient documentation

## 2013-06-25 DIAGNOSIS — G609 Hereditary and idiopathic neuropathy, unspecified: Secondary | ICD-10-CM | POA: Insufficient documentation

## 2013-06-25 DIAGNOSIS — C50212 Malignant neoplasm of upper-inner quadrant of left female breast: Secondary | ICD-10-CM

## 2013-06-25 DIAGNOSIS — I4891 Unspecified atrial fibrillation: Secondary | ICD-10-CM | POA: Insufficient documentation

## 2013-06-25 DIAGNOSIS — C50919 Malignant neoplasm of unspecified site of unspecified female breast: Secondary | ICD-10-CM | POA: Insufficient documentation

## 2013-06-25 DIAGNOSIS — F411 Generalized anxiety disorder: Secondary | ICD-10-CM | POA: Insufficient documentation

## 2013-06-25 DIAGNOSIS — Z8612 Personal history of poliomyelitis: Secondary | ICD-10-CM | POA: Insufficient documentation

## 2013-06-25 DIAGNOSIS — Z79899 Other long term (current) drug therapy: Secondary | ICD-10-CM | POA: Insufficient documentation

## 2013-06-25 DIAGNOSIS — K219 Gastro-esophageal reflux disease without esophagitis: Secondary | ICD-10-CM | POA: Insufficient documentation

## 2013-06-25 DIAGNOSIS — Z7901 Long term (current) use of anticoagulants: Secondary | ICD-10-CM | POA: Insufficient documentation

## 2013-06-25 NOTE — Progress Notes (Signed)
Complete PATIENT MEASURE OF DISTRESS worksheet with a score of 0 submitted to social work.  

## 2013-06-25 NOTE — Progress Notes (Signed)
See progress note under physician encounter. 

## 2013-06-25 NOTE — Progress Notes (Signed)
Radiation Oncology         [phone] ________________________________  Initial outpatient Consultation  Name: [name] MRN: [id number]  Date: 06/25/2013  DOB: Nov 01, 1931  NW:GNFAO,ZHY, MD  Valarie Merino, MD   REFERRING PHYSICIAN: Valarie Merino, MD  DIAGNOSIS: 79 year old woman with T1c (1.5 cm) N1a (1/2 SLN positive) M0 estrogen receptor positive grade I invasive ductal carcinoma of the left breast - Stage IIA  HISTORY OF PRESENT ILLNESS::[name] is a 79 y.o. female who is had a previous left mastectomy by Dr. Jamey Ripa in early 2002. She had a recent mammogram which showed a new lesion in her right breast which on biopsy was invasive carcinoma with surrounding DCIS.  MRI was done.  She proceeded to have a lumpectomy with sentinel node biopsy.  She had a 1.5 cm focus of invasive ductal carcinoma within the breast and an 8 mm surgical margin with resection. She had 2 sentinel lymph nodes obtained and one of the 2 contained a macro metastatic deposit measuring 1.3 cm.  PREVIOUS RADIATION THERAPY: Yes previous left chest wall radiotherapy in 2002   PAST MEDICAL HISTORY:  has a past medical history of Herpes zoster; Arteriosclerotic cardiovascular disease (ASCVD); Hyperlipidemia; Polio (1946); Peripheral neuropathy; Anxiety; Hypertension; GERD (gastroesophageal reflux disease); Rectal bleed; Chest pain; Atrial fibrillation; Chronic anticoagulation (04/12/2011); Renal insufficiency; Carcinoma of breast (2001); and Malignant neoplasm of breast (female), unspecified site (05/27/2013).    PAST SURGICAL HISTORY: Past Surgical History  Procedure Laterality Date  . Cholecystectomy  1999  . Colonoscopy  02/22/2011    Procedure: COLONOSCOPY;  Surgeon: Malissa Hippo, MD;  Location: [facility];  Service: Endoscopy;  Laterality: N/A;; performed for scant hematochezia  . Esophagogastroduodenoscopy  02/22/2011    Procedure: ESOPHAGOGASTRODUODENOSCOPY (EGD);  Surgeon: Malissa Hippo, MD;  Location: [facility];  Service: Endoscopy;  Laterality: N/A;  . Mastectomy  2001    Left;modified radical with lymph node dissection  . Port-a-cath removal      pac in and out 2002  . Dilation and curettage of uterus    . Eye surgery      both cataracts  . Breast lumpectomy with needle localization and axillary sentinel lymph node bx Right 05/27/2013    Procedure: BREAST LUMPECTOMY WITH NEEDLE LOCALIZATION AND AXILLARY SENTINEL LYMPH NODE BX;  Surgeon: Valarie Merino, MD;  Location: [facility];  Service: General;  Laterality: Right;    FAMILY HISTORY: family history includes Cancer in her brother, brother, and sister.  SOCIAL HISTORY:  reports that she has never smoked. She has never used smokeless tobacco. She reports that she does not drink alcohol or use illicit drugs.  ALLERGIES: Atorvastatin and Hydrocodone  MEDICATIONS:  Current Outpatient Prescriptions  Medication Sig Dispense Refill  . amiodarone (PACERONE) 200 MG tablet Take 1 tablet (200 mg total) by mouth 2 (two) times daily.  90 tablet  3  . dabigatran (PRADAXA) 150 MG CAPS capsule Take 1 capsule (150 mg total) by mouth 2 (two) times daily.  90 capsule  3  . furosemide (LASIX) 40 MG tablet Take 40 mg by mouth as needed.      . meclizine (ANTIVERT) 25 MG tablet Take 25 mg by mouth 3 (three) times daily as needed.      Marland Kitchen omeprazole (PRILOSEC) 20 MG capsule Take 20 mg by mouth daily.      . potassium chloride SA (K-DUR,KLOR-CON) 20 MEQ tablet Take 1 tablet (20 mEq total) by mouth  daily.  30 tablet  3  . pravastatin (PRAVACHOL) 20 MG tablet TAKE 1 TABLET EVERY DAY  30 tablet  10  . ramipril (ALTACE) 2.5 MG tablet Take 2.5 mg by mouth daily.      . ranitidine (ZANTAC) 150 MG tablet Take 150 mg by mouth at bedtime.      . tamoxifen (NOLVADEX) 20 MG tablet Take 1 tablet (20 mg total) by mouth daily.  60 tablet  3  . verapamil (VERELAN PM) 180 MG 24 hr capsule TAKE ONE CAPSULE BY MOUTH ONCE A DAY   90 capsule  2  . ALPRAZolam (XANAX) 0.25 MG tablet 0.25 mg. TAKE 1/2 TABLET AS NEEDED      . warfarin (COUMADIN) 5 MG tablet Take 1 tablet (5 mg total) by mouth as directed.  45 tablet  3   No current facility-administered medications for this encounter.    REVIEW OF SYSTEMS:  A 15 point review of systems is documented in the electronic medical record. This was obtained by the nursing staff. However, I reviewed this with the patient to discuss relevant findings and make appropriate changes.  A comprehensive review of systems was negative. she does have some discomfort at the operative site.   PHYSICAL EXAM:  height is 5\' 1"  (1.549 m) and weight is 185 lb 1.6 oz (83.961 kg). Her oral temperature is 98.8 F (37.1 C). Her blood pressure is 122/59 and her pulse is 64. Her respiration is 20 and oxygen saturation is 100%.   Her preoperative surgical physical exam with Dr. Daphine Deutscher revealed  Gen: WDWN white female NAD  Neurological: Alert and oriented to person, place, and time. Motor and sensory function is grossly intact  Head: Normocephalic and atraumatic.  Eyes: Conjunctivae are normal. Pupils are equal, round, and reactive to light. No scleral icterus.  Neck: Normal range of motion. Neck supple. No tracheal deviation or thyromegaly present.  Cardiovascular: SR without murmurs or gallops. No carotid bruits  Respiratory: Effort normal. No respiratory distress. No chest wall tenderness. Breath sounds normal. No wheezes, rales or rhonchi.  Abdomen: nontender  GU:  Musculoskeletal: Normal range of motion. Extremities are nontender. No cyanosis, edema or clubbing noted Lymphadenopathy: No cervical, preauricular, postauricular or axillary adenopathy is present Skin: Skin is warm and dry. No rash noted. No diaphoresis. No erythema. No pallor. Pscyh: Normal mood and affect. Behavior is normal. Judgment and thought content normal.  Currently, on my exam, her lumpectomy incision appears well-healed superior  to the nipple somewhat medially. The axillary sentinel lymph node incision also appears to be well-healed. There is minimal tenderness or ecchymosis.  KPS = 100  100 - Normal; no complaints; no evidence of disease. 90   - Able to carry on normal activity; minor signs or symptoms of disease. 80   - Normal activity with effort; some signs or symptoms of disease. 77   - Cares for self; unable to carry on normal activity or to do active work. 60   - Requires occasional assistance, but is able to care for most of his personal needs. 50   - Requires considerable assistance and frequent medical care. 40   - Disabled; requires special care and assistance. 30   - Severely disabled; hospital admission is indicated although death not imminent. 20   - Very sick; hospital admission necessary; active supportive treatment necessary. 10   - Moribund; fatal processes progressing rapidly. 0     - Dead  Karnofsky DA, Abelmann WH, Craver LS and  Burchenal JH [phone]) The use of the nitrogen mustards in the palliative treatment of carcinoma: with particular reference to bronchogenic carcinoma Cancer 1 [id number]  LABORATORY DATA:  Lab Results  Component Value Date   WBC 7.0 02/06/2013   HGB 12.0 05/27/2013   HCT 37.1 02/06/2013   MCV 80.3 02/06/2013   PLT 321 02/06/2013   Lab Results  Component Value Date   NA 136 05/22/2013   K 4.3 05/22/2013   CL 99 05/22/2013   CO2 28 05/22/2013   Lab Results  Component Value Date   ALT 8 03/22/2011   AST 17 03/22/2011   ALKPHOS 90 03/22/2011   BILITOT 0.8 03/22/2011     RADIOGRAPHY: Nm Sentinel Node Inj-no Rpt (breast)  05/27/2013   CLINICAL DATA: right breast cancer for SLNBx   Sulfur colloid was injected intradermally by the nuclear medicine  technologist for breast cancer sentinel node localization.    Mm Rt Plc Breast Loc Dev   1st Lesion  Inc Mammo Guide  05/27/2013   CLINICAL DATA:  Right breast cancer.  EXAM: NEEDLE LOCALIZATION OF THE RIGHT BREAST WITH MAMMO  GUIDANCE  COMPARISON:  Previous exams.  FINDINGS: Patient presents for needle localization prior to lumpectomy. I met with the patient and we discussed the procedure of needle localization including benefits and alternatives. We discussed the high likelihood of a successful procedure. We discussed the risks of the procedure, including infection, bleeding, tissue injury, and further surgery. Informed, written consent was given. The usual time-out protocol was performed immediately prior to the procedure.  Using mammographic guidance, sterile technique, 2% lidocaine and a 5 cm modified Kopans needle, the biopsy site marker was localized using superior to inferior approach. The films were marked for Dr. Daphine Deutscher.  Specimen radiograph was performed at [facility] and confirms the clip and hookwire to be present in the tissue sample. The specimen was marked for pathology.  IMPRESSION: Needle localization of the right breast. No apparent complications.   Electronically Signed   By: Jerene Dilling M.D.   On: 05/27/2013 11:59      IMPRESSION: This patient is a very nice 79 year old woman who has recently completed breast conserving surgery for grade 1 ER positive breast cancer with one positive lymph node. Only 2 lymph nodes were sampled, however her axilla is clinically negative. She would be a candidate for completion axillary dissection versus comprehensive axillary radiotherapy in conjunction with breast conserving whole breast radiotherapy. In addition to local treatment, the patient may benefit from antiestrogen therapy such as tamoxifen and possibly systemic chemotherapy. She will meet with medical oncology tomorrow to discuss systemic treatment options. She may fall into a subgroup of node-positive elderly patients where Oncotype DX testing could be considered to help in form the discussion regarding systemic therapy. The NCCN guidelines recommend cytotoxic chemotherapy in this setting for node positive breast  cancer. I will look forward to see how medical oncology tailors  their treatment recommendation for her.  PLAN:Today, I talked to the patient about the findings and work-up thus far.  We discussed the natural history of breast cancer and general treatment, highlighting the role or radiotherapy in the management.  We discussed the available radiation techniques, and focused on the details of logistics and delivery.  We reviewed the anticipated acute and late sequelae associated with radiation in this setting.   The patient would like to proceed with radiation.  I spent 60 minutes minutes face to face with the patient and more than 50% of  that time was spent in counseling and/or coordination of care.  At this point, we will await medical oncology consultation. If the patient undergoes systemic therapy in the form of chemotherapy, we'll look forward to setting her up for radiotherapy to the breast and axilla following completion of chemotherapy. If she decides against systemic chemotherapy, we will be writing to proceed with radiotherapy to the breast and axilla at medical oncology's direction.   ------------------------------------------------  Artist Pais. Kathrynn Running, M.D.

## 2013-06-25 NOTE — Progress Notes (Signed)
Patient states that she is very nervous about her appointment today. Reports that she lives near [place]. Denies breast pain. Reports that her right breast remains tender from surgery. Denies feeling any warmth/fever in her right breast. Denies nipple discharge. Shortness of breath noted. Patient relates SOB to irregular heart beat/a fib. Reports an occasional "stinging" sensation in her right axilla. Dr. Randel Pigg is her PCP and Dr. Wyline Mood and Ladona Ridgel are managing her a fib. Right breast incisions are well approximated without redness, drainage or edema. Dermabond noted over axilla incision.

## 2013-06-26 ENCOUNTER — Telehealth: Payer: Self-pay | Admitting: Oncology

## 2013-06-26 ENCOUNTER — Ambulatory Visit (HOSPITAL_BASED_OUTPATIENT_CLINIC_OR_DEPARTMENT_OTHER): Payer: Medicare Other | Admitting: Oncology

## 2013-06-26 ENCOUNTER — Other Ambulatory Visit (HOSPITAL_BASED_OUTPATIENT_CLINIC_OR_DEPARTMENT_OTHER): Payer: Medicare Other

## 2013-06-26 ENCOUNTER — Telehealth: Payer: Self-pay | Admitting: *Deleted

## 2013-06-26 ENCOUNTER — Ambulatory Visit: Payer: Medicare Other

## 2013-06-26 ENCOUNTER — Encounter: Payer: Self-pay | Admitting: *Deleted

## 2013-06-26 VITALS — BP 135/75 | HR 74 | Temp 97.1°F | Resp 18 | Ht 61.0 in | Wt 183.1 lb

## 2013-06-26 DIAGNOSIS — I4891 Unspecified atrial fibrillation: Secondary | ICD-10-CM

## 2013-06-26 DIAGNOSIS — C50919 Malignant neoplasm of unspecified site of unspecified female breast: Secondary | ICD-10-CM

## 2013-06-26 DIAGNOSIS — C50912 Malignant neoplasm of unspecified site of left female breast: Secondary | ICD-10-CM

## 2013-06-26 DIAGNOSIS — C50911 Malignant neoplasm of unspecified site of right female breast: Secondary | ICD-10-CM

## 2013-06-26 DIAGNOSIS — Z01818 Encounter for other preprocedural examination: Secondary | ICD-10-CM

## 2013-06-26 DIAGNOSIS — Z7901 Long term (current) use of anticoagulants: Secondary | ICD-10-CM

## 2013-06-26 LAB — CBC WITH DIFFERENTIAL/PLATELET
BASO%: 1.9 % (ref 0.0–2.0)
Basophils Absolute: 0.1 10*3/uL (ref 0.0–0.1)
EOS%: 2.7 % (ref 0.0–7.0)
HCT: 40.2 % (ref 34.8–46.6)
HGB: 13 g/dL (ref 11.6–15.9)
LYMPH%: 30.1 % (ref 14.0–49.7)
MCH: 25.8 pg (ref 25.1–34.0)
MCV: 79.7 fL (ref 79.5–101.0)
MONO#: 0.7 10*3/uL (ref 0.1–0.9)
MONO%: 11.9 % (ref 0.0–14.0)
NEUT%: 53.4 % (ref 38.4–76.8)
WBC: 5.6 10*3/uL (ref 3.9–10.3)
lymph#: 1.7 10*3/uL (ref 0.9–3.3)

## 2013-06-26 LAB — COMPREHENSIVE METABOLIC PANEL (CC13)
ALT: 9 U/L (ref 0–55)
Alkaline Phosphatase: 91 U/L (ref 40–150)
Anion Gap: 8 mEq/L (ref 3–11)
BUN: 14.6 mg/dL (ref 7.0–26.0)
CO2: 27 mEq/L (ref 22–29)
Calcium: 9.5 mg/dL (ref 8.4–10.4)
Chloride: 105 mEq/L (ref 98–109)
Creatinine: 0.9 mg/dL (ref 0.6–1.1)
Glucose: 88 mg/dl (ref 70–140)

## 2013-06-26 LAB — LACTATE DEHYDROGENASE (CC13): LDH: 222 U/L (ref 125–245)

## 2013-06-26 MED ORDER — EXEMESTANE 25 MG PO TABS: 25.0000 mg | ORAL_TABLET | Freq: Every day | ORAL | Status: DC

## 2013-06-26 NOTE — Patient Instructions (Addendum)
1. Stop Tamoxifen 2. Decrease Pradaxa to 75 mg twice daily until I talk with Dr Ladona Ridgel 3. New Hormone pill for breast cancer: Aromasin 25 mg (exemestane) one daily to start after you complete radiation treatments 4. Start Calcium with vitamin D 1 tablet twice daily (can get over the counter) 5. Begin Xgeva (denosumab) injections twice a year starting on December 29, for bone health I will see you back in February before changing to a new MDDenosumab injection What is this medicine? DENOSUMAB (den oh sue mab) slows bone breakdown. Prolia is used to treat osteoporosis in women after menopause and in men. Rivka Barbara is used to prevent bone fractures and other bone problems caused by cancer bone metastases. Rivka Barbara is also used to treat giant cell tumor of the bone. This medicine may be used for other purposes; ask your health care provider or pharmacist if you have questions. COMMON BRAND NAME(S): Prolia, XGEVA What should I tell my health care provider before I take this medicine? They need to know if you have any of these conditions: -dental disease -eczema -infection or history of infections -kidney disease or on dialysis -low blood calcium or vitamin D -malabsorption syndrome -scheduled to have surgery or tooth extraction -taking medicine that contains denosumab -thyroid or parathyroid disease -an unusual reaction to denosumab, other medicines, foods, dyes, or preservatives -pregnant or trying to get pregnant -breast-feeding How should I use this medicine? This medicine is for injection under the skin. It is given by a health care professional in a hospital or clinic setting. If you are getting Prolia, a special MedGuide will be given to you by the pharmacist with each prescription and refill. Be sure to read this information carefully each time. For Prolia, talk to your pediatrician regarding the use of this medicine in children. Special care may be needed. For Rivka Barbara, talk to your pediatrician  regarding the use of this medicine in children. While this drug may be prescribed for children as young as 13 years for selected conditions, precautions do apply. Overdosage: If you think you've taken too much of this medicine contact a poison control center or emergency room at once. Overdosage: If you think you have taken too much of this medicine contact a poison control center or emergency room at once. NOTE: This medicine is only for you. Do not share this medicine with others. What if I miss a dose? It is important not to miss your dose. Call your doctor or health care professional if you are unable to keep an appointment. What may interact with this medicine? Do not take this medicine with any of the following medications: -other medicines containing denosumab This medicine may also interact with the following medications: -medicines that suppress the immune system -medicines that treat cancer -steroid medicines like prednisone or cortisone This list may not describe all possible interactions. Give your health care provider a list of all the medicines, herbs, non-prescription drugs, or dietary supplements you use. Also tell them if you smoke, drink alcohol, or use illegal drugs. Some items may interact with your medicine. What should I watch for while using this medicine? Visit your doctor or health care professional for regular checks on your progress. Your doctor or health care professional may order blood tests and other tests to see how you are doing. Call your doctor or health care professional if you get a cold or other infection while receiving this medicine. Do not treat yourself. This medicine may decrease your body's ability to fight infection.  You should make sure you get enough calcium and vitamin D while you are taking this medicine, unless your doctor tells you not to. Discuss the foods you eat and the vitamins you take with your health care professional. See your dentist  regularly. Brush and floss your teeth as directed. Before you have any dental work done, tell your dentist you are receiving this medicine. Do not become pregnant while taking this medicine or for 5 months after stopping it. Women should inform their doctor if they wish to become pregnant or think they might be pregnant. There is a potential for serious side effects to an unborn child. Talk to your health care professional or pharmacist for more information. What side effects may I notice from receiving this medicine? Side effects that you should report to your doctor or health care professional as soon as possible: -allergic reactions like skin rash, itching or hives, swelling of the face, lips, or tongue -breathing problems -chest pain -fast, irregular heartbeat -feeling faint or lightheaded, falls -fever, chills, or any other sign of infection -muscle spasms, tightening, or twitches -numbness or tingling -skin blisters or bumps, or is dry, peels, or red -slow healing or unexplained pain in the mouth or jaw -unusual bleeding or bruising Side effects that usually do not require medical attention (Report these to your doctor or health care professional if they continue or are bothersome.): -muscle pain -stomach upset, gas This list may not describe all possible side effects. Call your doctor for medical advice about side effects. You may report side effects to FDA at 1-800-FDA-1088. Where should I keep my medicine? This medicine is only given in a clinic, doctor's office, or other health care setting and will not be stored at home. NOTE: This sheet is a summary. It may not cover all possible information. If you have questions about this medicine, talk to your doctor, pharmacist, or health care provider.  2014, Elsevier/Gold Standard. (2011-12-25 12:37:47) Exemestane tablets What is this medicine? EXEMESTANE (ex e MES tane) blocks the production of the hormone estrogen. Some types of breast  cancer depend on estrogen to grow, and this medicine can stop tumor growth by blocking estrogen production. This medicine is for the treatment of breast cancer in postmenopausal women only. This medicine may be used for other purposes; ask your health care provider or pharmacist if you have questions. COMMON BRAND NAME(S): Aromasin What should I tell my health care provider before I take this medicine? They need to know if you have any of these conditions: -an unusual or allergic reaction to exemestane, other medicines, foods, dyes, or preservatives -pregnant or trying to get pregnant -breast-feeding How should I use this medicine? Take this medicine by mouth with a glass of water. Follow the directions on the prescription label. Take your doses at regular intervals after a meal. Do not take your medicine more often than directed. Do not stop taking except on the advice of your doctor or health care professional. Contact your pediatrician regarding the use of this medicine in children. Special care may be needed. Overdosage: If you think you have taken too much of this medicine contact a poison control center or emergency room at once. NOTE: This medicine is only for you. Do not share this medicine with others. What if I miss a dose? If you miss a dose, take the next dose as usual. Do not try to make up the missed dose. Do not take double or extra doses. What may interact with this  medicine? Do not take this medicine with any of the following medications: -female hormones, like estrogens and birth control pills This medicine may also interact with the following medications: -androstenedione -phenytoin -rifabutin, rifampin, or rifapentine -St. John's Wort This list may not describe all possible interactions. Give your health care provider a list of all the medicines, herbs, non-prescription drugs, or dietary supplements you use. Also tell them if you smoke, drink alcohol, or use illegal drugs.  Some items may interact with your medicine. What should I watch for while using this medicine? Visit your doctor or health care professional for regular checks on your progress. If you experience hot flashes or sweating while taking this medicine, avoid alcohol, smoking and drinks with caffeine. This may help to decrease these side effects. What side effects may I notice from receiving this medicine? Side effects that you should report to your doctor or health care professional as soon as possible: -any new or unusual symptoms -changes in vision -fever -leg or arm swelling -pain in bones, joints, or muscles -pain in hips, back, ribs, arms, shoulders, or legs Side effects that usually do not require medical attention (report to your doctor or health care professional if they continue or are bothersome): -difficulty sleeping -headache -hot flashes -sweating -unusually weak or tired This list may not describe all possible side effects. Call your doctor for medical advice about side effects. You may report side effects to FDA at 1-800-FDA-1088. Where should I keep my medicine? Keep out of the reach of children. Store at room temperature between 15 and 30 degrees C (59 and 86 degrees F). Throw away any unused medicine after the expiration date. NOTE: This sheet is a summary. It may not cover all possible information. If you have questions about this medicine, talk to your doctor, pharmacist, or health care provider.  2014, Elsevier/Gold Standard. (2007-10-29 11:48:29)

## 2013-06-26 NOTE — Telephone Encounter (Signed)
Tried to call pt with no answer. Will try to call pt back.

## 2013-06-26 NOTE — Telephone Encounter (Signed)
Message copied by Thompson Grayer on Thu Jun 26, 2013  3:20 PM ------      Message from: Ashley Akin      Created: Thu Jun 26, 2013  2:28 PM      Regarding: RE: cardioversion       Patient scheduled for Cardioversion Friday July 18, 2013 @ 0900.  Please have patient arrive at [facility] @ 0800.  Please advise patient of appointment date and time and any other instructions.            Thanks,      Aurther Loft      ----- Message -----         From: Thompson Grayer, LPN         Sent: 05/15/2013   4:53 PM           To: Ashley Akin      Subject: cardioversion                                            SCHEDULE CARDIOVERSION FOR THE BEGINNING OF THE YEAR :)       ------

## 2013-06-26 NOTE — Telephone Encounter (Signed)
gv adn printed appt sched and avs for pt for DEC thru Feb 2015

## 2013-06-26 NOTE — Progress Notes (Signed)
New Patient Hematology-Oncology Evaluation   [name] [id number] Apr 05, 1932 79 y.o. 06/26/2013  CC: Dr. Carylon Perches; Dr. Luretha Murphy; Dr. Margaretmary Dys; Dr. Gilman Schmidt   Reason for referral: Second primary invasive cancer right breast   HPI:  Pleasant 78 year old woman who I graduated from our practice over 3 years ago in 2011. She was a 10 year survivor of an initial stage II, 6 node positive, ER PR positive, HER-2 positive, cancer of the left breast diagnosed in October 2001 treated by mastectomy followed by 6 cycles of Cytoxan Adriamycin Taxotere chemotherapy than 5 years of Arimidex hormonal therapy. Arimidex completed in June 2011.  A recent routine mammogram done on 03/25/2013 showed clustered calcifications which were suspicious for malignancy. She underwent a diagnostic mammogram on October 3 which confirmed these findings then she underwent a needle biopsy on October 14. MRI done on October 29 showed a 2 x 1.8 x 1.3 cm lesion at the 12:00 position. She underwent a lumpectomy and sentinel lymph node dissection on 05/27/2013 by Dr. Luretha Murphy. Final measurements of the tumor were 1.5 cm. Primarily invasive ductal. Positive vascular and lymphatic invasion. Grade 2 of 3. Estrogen and progesterone receptors both 100%. HER-2 negative. Ki-67 55%. One of 2 sentinel lymph nodes grossly positive for invasive cancer.  She has had a number of interim medical problems since she last saw me. She has developed atrial fibrillation. This was poorly controlled medically. She underwent DC cardioversion 2 years ago but unfortunately has reverted to atrial fibrillation again recently and another cardioversion procedure is planned. She was on verapamil long-acting and was recently started on amiodarone. Of note she was also started on one of the new oral anticoagulants, Pradaxa, in full doses of 150 mg twice daily.  She denies any bone pain. She has no history of osteoporosis.   PMH: Past  Medical History  Diagnosis Date  . Herpes zoster   . Arteriosclerotic cardiovascular disease (ASCVD)      cath Feb '07- no obstructive dz - 20% proximal LAD only; EF 65%; possible coronary artery spasm  . Hyperlipidemia     Lipid profile in 11/2008:282, 176, 64, 201  . Polio 1946    at age 82  . Peripheral neuropathy   . Anxiety   . Hypertension     diastolic dysfunction; normal CMet and TSH in 11/2009; normal CBC in 04/2010  . GERD (gastroesophageal reflux disease)     hiatal hernia  . Rectal bleed   . Chest pain     Long-standing and atypical  . Atrial fibrillation     Paroxysmal on event recorder in 2009; regular supraventricular tachycardia at a rate of 150 also identified on that study representing either atrial flutter or PSVT; onset of persistent AF in 03/2011  . Chronic anticoagulation 04/12/2011  . Renal insufficiency   . Carcinoma of breast 2001    Left mastectomy with positive nodes  . Malignant neoplasm of breast (female), unspecified site 05/27/2013    right breast lumpectomy with positive node    Past Surgical History  Procedure Laterality Date  . Cholecystectomy  1999  . Colonoscopy  02/22/2011    Procedure: COLONOSCOPY;  Surgeon: Malissa Hippo, MD;  Location: [facility];  Service: Endoscopy;  Laterality: N/A;; performed for scant hematochezia  . Esophagogastroduodenoscopy  02/22/2011    Procedure: ESOPHAGOGASTRODUODENOSCOPY (EGD);  Surgeon: Malissa Hippo, MD;  Location: [facility];  Service: Endoscopy;  Laterality: N/A;  . Mastectomy  2001  Left;modified radical with lymph node dissection  . Port-a-cath removal      pac in and out 2002  . Dilation and curettage of uterus    . Eye surgery      both cataracts  . Breast lumpectomy with needle localization and axillary sentinel lymph node bx Right 05/27/2013    Procedure: BREAST LUMPECTOMY WITH NEEDLE LOCALIZATION AND AXILLARY SENTINEL LYMPH NODE BX;  Surgeon: Valarie Merino, MD;  Location: [facility];  Service: General;  Laterality: Right;    Allergies: Allergies  Allergen Reactions  . Atorvastatin Other (See Comments)    Myalgias  . Hydrocodone Other (See Comments)    GI distress.    Medications: Amiodarone 200 mg twice a day, Pradaxa wedge and 50 mg twice a day, Lasix 40 mg when necessary, Antivert 25 mg 3 times a day when necessary, Prilosec 20 mg daily, potassium 20 mEq 1 daily, Pravachol 20 mg daily, Altace 2.5 mg daily, Zantac 150 mg at bedtime, verapamil 180 mg 24-hour capsule one daily.    Social History: She lost her husband 2 years ago with cancer. She has 2 sons both are healthy age 30 and 19. Younger son had a ASD repair as a child. she has never smoked. . she does not drink alcohol   Family History: All of her siblings are now deceased. Family History  Problem Relation Age of Onset  . Cancer Sister     lung  . Cancer: Lymphoma  Brother       . Cancer Brother     kidney and melanoma    Review of Systems: Hematology: negative for swollen glands, easy bruising, epistaxis, gum bleeding, hematuria, hematochezia ENT ROS: negative for - oral lesions or sore throat Breast ROS:  see above  Respiratory ROS: negative  no cough  Cardiovascular ROS: negativeno ischemic type chest pain. She is getting dyspnea with minimal exertion. She has noted ankle swelling asymmetric right greater than left.  When her heart rate gets up to 160 she does have a sensation of discomfort   orthopnea, palpitations,  no paroxysmal nocturnal dyspnea  Gastrointestinal ROS: negative for - abdominal pain, appetite loss, blood in stools, change in bowel habits, constipation, diarrhea, heartburn, hematemesis, melena, nausea/vomiting or swallowing difficulty/pain Genito-Urinary ROS:  chronic interstitial cystitis   Musculoskeletal ROS: negative for - joint pain, joint stiffness, joint swelling, muscle pain, muscular weakness  Neurological ROS:  no headache or change in vision. No focal  weakness. No paresthesias. Dermatological ROS: negative for rash, ecchymosis Remaining ROS negative.  Physical Exam: Blood pressure 135/75, pulse 74, temperature 97.1 F (36.2 C), temperature source Oral, resp. rate 18, height 5\' 1"  (1.549 m), weight 183 lb 1.6 oz (83.054 kg). Wt Readings from Last 3 Encounters:  06/26/13 183 lb 1.6 oz (83.054 kg)  06/25/13 185 lb 1.6 oz (83.961 kg)  06/03/13 187 lb (84.823 kg)     General appearance: Well-nourished Caucasian woman  HENNT: Pharynx no erythema, exudate, mass, or ulcer. No thyromegaly or thyroid nodules Lymph nodes: No cervical, supraclavicular, or axillary lymphadenopathy Breasts:Left mastectomy. No chest wall lesions. Healed scar in the right breast from recent surgery. No dominant mass.  Lungs: Clear to auscultation, resonant to percussion throughout Heart: Regular rhythm, no murmur, no gallop, no rub, no click, no edema Abdomen: Soft, nontender, normal bowel sounds, no mass, no organomegaly Extremities:  1+ edema of the ankles. Left calf 42 cm right 42 cm, left ankle 25.5 cm right 27 cm., no calf  tenderness Musculoskeletal: no joint deformities GU:  Vascular: Carotid pulses 2+, no bruits, distal pulses:  Neurologic: Alert, oriented, PERRLA, optic discs sharp and vessels normal, no hemorrhage or exudate, cranial nerves grossly normal, motor strength 5 over 5, reflexes 1+ symmetric, upper body coordination normal, gait normal,Sensation intact to vibration over the fingertips  Skin: No rash or ecchymosis    Lab Results: Lab Results  Component Value Date   WBC 5.6 06/26/2013   HGB 13.0 06/26/2013   HCT 40.2 06/26/2013   MCV 79.7 06/26/2013   PLT 271 06/26/2013     Chemistry      Component Value Date/Time   NA 140 06/26/2013 0924   NA 136 05/22/2013 1115   K 4.4 06/26/2013 0924   K 4.3 05/22/2013 1115   CL 99 05/22/2013 1115   CO2 27 06/26/2013 0924   CO2 28 05/22/2013 1115   BUN 14.6 06/26/2013 0924   BUN 17 05/22/2013  1115   CREATININE 0.9 06/26/2013 0924   CREATININE 0.91 05/22/2013 1115   CREATININE 0.86 02/10/2013 0000      Component Value Date/Time   CALCIUM 9.5 06/26/2013 0924   CALCIUM 9.7 05/22/2013 1115   ALKPHOS 91 06/26/2013 0924   ALKPHOS 90 03/22/2011 1429   AST 18 06/26/2013 0924   AST 17 03/22/2011 1429   ALT 9 06/26/2013 0924   ALT 8 03/22/2011 1429   BILITOT 0.76 06/26/2013 0924   BILITOT 0.8 03/22/2011 1429       Pathology: see discussion above   Radiological Studies: See discussion above .    Impression and Plan: #1. Second primary T1, N1, M0 strongly ER/PR positive, HER-2 negative, invasive cancer the right breast. She is now 79 years old. She has active cardiac disease. In my opinion, hormonal therapy alone is the treatment of choice. I will not put her on any chemotherapy. She is ready seen Dr. Margaretmary Dys. I would guess that he proceed immediately with radiation therapy and then I will put her on Aromasin hormonal therapy 25 mg daily. Begin calcium with vitamin D supplements. Begin every 6 months subcutaneous Xgeva injections to increase bone strength.  #2. Chronic anticoagulation secondary to atrophic fibrillation Estimated creatinine clearance 58 mL per minute. Pradaxa primarily cleared by the kidneys. One of the few drugs that Pradaxa interacts with his amiodarone which will increase the blood levels. There appears to be an increased frequency of GI bleeding in elderly patients on Pradaxa. We still do not have a reversal agent for this drug. I have advised the patient to decrease her Pradaxa to 75 mg twice daily until I can contact Dr. Sharrell Ku. Apixiban may be a better choice since it is only partially cleared by the kidney.    [place] copy Dr. Luretha Murphy, Dr. Carylon Perches, Dr. Gilman Schmidt.  Levert Feinstein, MD 06/26/2013, 6:02 PM

## 2013-06-27 ENCOUNTER — Other Ambulatory Visit: Payer: Self-pay | Admitting: *Deleted

## 2013-06-27 ENCOUNTER — Telehealth: Payer: Self-pay | Admitting: Oncology

## 2013-06-27 MED ORDER — DABIGATRAN ETEXILATE MESYLATE 75 MG PO CAPS: 75.0000 mg | ORAL_CAPSULE | Freq: Two times a day (BID) | ORAL | Status: DC

## 2013-06-27 NOTE — Telephone Encounter (Signed)
per 12/18 pof inj 12/29 and 6/29. pt on schedule for other injs and per hyper llink on pof inj is q28d. message to JG to clariy frequency of injs. pt not yet  contacted

## 2013-06-30 ENCOUNTER — Other Ambulatory Visit: Payer: Self-pay

## 2013-06-30 ENCOUNTER — Telehealth: Payer: Self-pay | Admitting: *Deleted

## 2013-06-30 ENCOUNTER — Telehealth: Payer: Self-pay | Admitting: Radiation Oncology

## 2013-06-30 DIAGNOSIS — I4891 Unspecified atrial fibrillation: Secondary | ICD-10-CM

## 2013-06-30 DIAGNOSIS — I10 Essential (primary) hypertension: Secondary | ICD-10-CM

## 2013-06-30 NOTE — Telephone Encounter (Signed)
Made pt aware of her cardioversion appointment on Jan 9th,  At 9:00am. Pt is to have labs drawn during the first week in January.

## 2013-06-30 NOTE — Telephone Encounter (Signed)
Scheduled simulation appointment with patient for 07/07/2013 at 1300.

## 2013-07-07 ENCOUNTER — Ambulatory Visit
Admission: RE | Admit: 2013-07-07 | Discharge: 2013-07-07 | Disposition: A | Discharge status: Home / Self Care | Payer: Medicare Other | Source: Ambulatory Visit | Attending: Radiation Oncology | Admitting: Radiation Oncology

## 2013-07-07 ENCOUNTER — Ambulatory Visit (HOSPITAL_BASED_OUTPATIENT_CLINIC_OR_DEPARTMENT_OTHER): Payer: Medicare Other

## 2013-07-07 VITALS — BP 131/77 | HR 71 | Temp 98.4°F

## 2013-07-07 DIAGNOSIS — C50919 Malignant neoplasm of unspecified site of unspecified female breast: Secondary | ICD-10-CM | POA: Insufficient documentation

## 2013-07-07 DIAGNOSIS — C50912 Malignant neoplasm of unspecified site of left female breast: Secondary | ICD-10-CM

## 2013-07-07 DIAGNOSIS — Z510 Encounter for antineoplastic radiation therapy: Secondary | ICD-10-CM | POA: Insufficient documentation

## 2013-07-07 DIAGNOSIS — L819 Disorder of pigmentation, unspecified: Secondary | ICD-10-CM | POA: Insufficient documentation

## 2013-07-07 DIAGNOSIS — L539 Erythematous condition, unspecified: Secondary | ICD-10-CM | POA: Insufficient documentation

## 2013-07-07 DIAGNOSIS — C50911 Malignant neoplasm of unspecified site of right female breast: Secondary | ICD-10-CM

## 2013-07-07 DIAGNOSIS — Z170 Estrogen receptor positive status [ER+]: Secondary | ICD-10-CM | POA: Insufficient documentation

## 2013-07-07 DIAGNOSIS — C50211 Malignant neoplasm of upper-inner quadrant of right female breast: Secondary | ICD-10-CM

## 2013-07-07 MED ORDER — DENOSUMAB 60 MG/ML ~~LOC~~ SOLN
60.0000 mg | Freq: Once | SUBCUTANEOUS | Status: AC
  Administered 2013-07-07: 60 mg via SUBCUTANEOUS
  Filled 2013-07-07: qty 1

## 2013-07-07 NOTE — Progress Notes (Signed)
Consent form for Prolia signed and information given

## 2013-07-07 NOTE — Progress Notes (Signed)
  Radiation Oncology         [phone]) [phone] ________________________________  Name: [name] MRN: [id number]  Date: 07/07/2013  DOB: [birth year]  SIMULATION AND TREATMENT PLANNING NOTE  DIAGNOSIS:  79 year old woman with T1c (1.5 cm) N1a (1/2 SLN positive) M0 estrogen receptor positive grade I invasive ductal carcinoma of the left breast - Stage IIA  NARRATIVE:  The patient was brought to the CT Simulation planning suite.  Identity was confirmed.  All relevant records and images related to the planned course of therapy were reviewed.  The patient freely provided informed written consent to proceed with treatment after reviewing the details related to the planned course of therapy. The consent form was witnessed and verified by the simulation staff.  Then, the patient was set-up in a stable reproducible  supine and arms overhead position for radiation therapy.  CT images were obtained.  Surface markings were placed.  The CT images were loaded into the planning software.  Then the target and avoidance structures were contoured.  Treatment planning then occurred.  The radiation prescription was entered and confirmed.  Then, I designed and supervised the construction of a total of 5 medically necessary complex treatment devices including a customized molded neck pillow for daily positioning along with 4 multileaf collimator aperture as to shape radiation around the whole breast super clavicular region and axilla using the standard 4 field setup technique..  I have requested : Isodose Plan.   PLAN:  The patient will receive 50.4 Gy in 28 fraction to the whole breast, right supraclavicular region, and axilla.  This will be followed by electron boost to the lumpectomy site only.   ________________________________  Artist Pais Kathrynn Running, M.D.

## 2013-07-07 NOTE — Patient Instructions (Signed)
Denosumab injection  What is this medicine?  DENOSUMAB (den oh sue mab) slows bone breakdown. Prolia is used to treat osteoporosis in women after menopause and in men. Xgeva is used to prevent bone fractures and other bone problems caused by cancer bone metastases. Xgeva is also used to treat giant cell tumor of the bone.  This medicine may be used for other purposes; ask your health care provider or pharmacist if you have questions.  COMMON BRAND NAME(S): Prolia, XGEVA  What should I tell my health care provider before I take this medicine?  They need to know if you have any of these conditions:  -dental disease  -eczema  -infection or history of infections  -kidney disease or on dialysis  -low blood calcium or vitamin D  -malabsorption syndrome  -scheduled to have surgery or tooth extraction  -taking medicine that contains denosumab  -thyroid or parathyroid disease  -an unusual reaction to denosumab, other medicines, foods, dyes, or preservatives  -pregnant or trying to get pregnant  -breast-feeding  How should I use this medicine?  This medicine is for injection under the skin. It is given by a health care professional in a hospital or clinic setting.  If you are getting Prolia, a special MedGuide will be given to you by the pharmacist with each prescription and refill. Be sure to read this information carefully each time.  For Prolia, talk to your pediatrician regarding the use of this medicine in children. Special care may be needed. For Xgeva, talk to your pediatrician regarding the use of this medicine in children. While this drug may be prescribed for children as young as 13 years for selected conditions, precautions do apply.  Overdosage: If you think you've taken too much of this medicine contact a poison control center or emergency room at once.  Overdosage: If you think you have taken too much of this medicine contact a poison control center or emergency room at once.  NOTE: This medicine is only for  you. Do not share this medicine with others.  What if I miss a dose?  It is important not to miss your dose. Call your doctor or health care professional if you are unable to keep an appointment.  What may interact with this medicine?  Do not take this medicine with any of the following medications:  -other medicines containing denosumab  This medicine may also interact with the following medications:  -medicines that suppress the immune system  -medicines that treat cancer  -steroid medicines like prednisone or cortisone  This list may not describe all possible interactions. Give your health care provider a list of all the medicines, herbs, non-prescription drugs, or dietary supplements you use. Also tell them if you smoke, drink alcohol, or use illegal drugs. Some items may interact with your medicine.  What should I watch for while using this medicine?  Visit your doctor or health care professional for regular checks on your progress. Your doctor or health care professional may order blood tests and other tests to see how you are doing.  Call your doctor or health care professional if you get a cold or other infection while receiving this medicine. Do not treat yourself. This medicine may decrease your body's ability to fight infection.  You should make sure you get enough calcium and vitamin D while you are taking this medicine, unless your doctor tells you not to. Discuss the foods you eat and the vitamins you take with your health care professional.    See your dentist regularly. Brush and floss your teeth as directed. Before you have any dental work done, tell your dentist you are receiving this medicine.  Do not become pregnant while taking this medicine or for 5 months after stopping it. Women should inform their doctor if they wish to become pregnant or think they might be pregnant. There is a potential for serious side effects to an unborn child. Talk to your health care professional or pharmacist for more  information.  What side effects may I notice from receiving this medicine?  Side effects that you should report to your doctor or health care professional as soon as possible:  -allergic reactions like skin rash, itching or hives, swelling of the face, lips, or tongue  -breathing problems  -chest pain  -fast, irregular heartbeat  -feeling faint or lightheaded, falls  -fever, chills, or any other sign of infection  -muscle spasms, tightening, or twitches  -numbness or tingling  -skin blisters or bumps, or is dry, peels, or red  -slow healing or unexplained pain in the mouth or jaw  -unusual bleeding or bruising  Side effects that usually do not require medical attention (Report these to your doctor or health care professional if they continue or are bothersome.):  -muscle pain  -stomach upset, gas  This list may not describe all possible side effects. Call your doctor for medical advice about side effects. You may report side effects to FDA at 1-800-FDA-1088.  Where should I keep my medicine?  This medicine is only given in a clinic, doctor's office, or other health care setting and will not be stored at home.  NOTE: This sheet is a summary. It may not cover all possible information. If you have questions about this medicine, talk to your doctor, pharmacist, or health care provider.  © 2014, [employer]/Gold Standard. (2011-12-25 12:37:47)

## 2013-07-08 ENCOUNTER — Encounter (HOSPITAL_COMMUNITY): Payer: Self-pay | Admitting: Pharmacy Technician

## 2013-07-11 ENCOUNTER — Telehealth: Payer: Self-pay | Admitting: *Deleted

## 2013-07-11 ENCOUNTER — Ambulatory Visit (INDEPENDENT_AMBULATORY_CARE_PROVIDER_SITE_OTHER): Payer: Medicare Other

## 2013-07-11 ENCOUNTER — Other Ambulatory Visit: Payer: Self-pay | Admitting: Internal Medicine

## 2013-07-11 ENCOUNTER — Telehealth: Payer: Self-pay | Admitting: Oncology

## 2013-07-11 ENCOUNTER — Other Ambulatory Visit: Payer: Self-pay

## 2013-07-11 VITALS — BP 130/80 | HR 72

## 2013-07-11 DIAGNOSIS — I4891 Unspecified atrial fibrillation: Secondary | ICD-10-CM

## 2013-07-11 DIAGNOSIS — I2510 Atherosclerotic heart disease of native coronary artery without angina pectoris: Secondary | ICD-10-CM

## 2013-07-11 DIAGNOSIS — Z01818 Encounter for other preprocedural examination: Secondary | ICD-10-CM

## 2013-07-11 LAB — LIPID PANEL
CHOLESTEROL: 194 mg/dL (ref 0–200)
HDL: 71 mg/dL (ref >39)
LDL CALC: 98 mg/dL (ref 0–99)
Total CHOL/HDL Ratio: 2.7 Ratio
Triglycerides: 123 mg/dL (ref <150)
VLDL: 25 mg/dL (ref 0–40)

## 2013-07-11 LAB — CBC WITH DIFFERENTIAL/PLATELET
Basophils Absolute: 0 10*3/uL (ref 0.0–0.1)
Basophils Relative: 1 % (ref 0–1)
Eosinophils Absolute: 0.1 10*3/uL (ref 0.0–0.7)
Eosinophils Relative: 2 % (ref 0–5)
HCT: 45.2 % (ref 36.0–46.0)
Hemoglobin: 14.5 g/dL (ref 12.0–15.0)
Lymphocytes Relative: 32 % (ref 12–46)
Lymphs Abs: 2 10*3/uL (ref 0.7–4.0)
MCH: 25.7 pg — ABNORMAL LOW (ref 26.0–34.0)
MCHC: 32.1 g/dL (ref 30.0–36.0)
MCV: 80 fL (ref 78.0–100.0)
Monocytes Absolute: 0.6 10*3/uL (ref 0.1–1.0)
Monocytes Relative: 9 % (ref 3–12)
Neutro Abs: 3.6 10*3/uL (ref 1.7–7.7)
Neutrophils Relative %: 56 % (ref 43–77)
Platelets: 294 10*3/uL (ref 150–400)
RBC: 5.65 MIL/uL — ABNORMAL HIGH (ref 3.87–5.11)
RDW: 16.6 % — ABNORMAL HIGH (ref 11.5–15.5)
WBC: 6.4 10*3/uL (ref 4.0–10.5)

## 2013-07-11 LAB — BASIC METABOLIC PANEL
BUN: 15 mg/dL (ref 6–23)
CO2: 27 mEq/L (ref 19–32)
Calcium: 9.1 mg/dL (ref 8.4–10.5)
Chloride: 102 mEq/L (ref 96–112)
Creat: 0.95 mg/dL (ref 0.50–1.10)
Glucose, Bld: 98 mg/dL (ref 70–99)
Potassium: 4.9 mEq/L (ref 3.5–5.3)
Sodium: 139 mEq/L (ref 135–145)

## 2013-07-11 NOTE — Progress Notes (Signed)
Patient in office today for EKG only. Currently in sinus rhythm. No afib noted.EKG scanned, Dr.Branch messaged for dispo regarding scheduled cardioversion on 07/18/13   C.Wilber Oliphant RN

## 2013-07-11 NOTE — Telephone Encounter (Signed)
Pt states that she thinks she is back in rhythm and wants to know what to do about procedure that is set up for 07/18/12 with Dr Lovena Le.

## 2013-07-11 NOTE — Patient Instructions (Signed)
Dr.Branch will review your EKG and we will call you with instructions regarding your planned cardioversion on 07/18/13

## 2013-07-11 NOTE — Telephone Encounter (Signed)
Patient called office inquiring if she should have cardoversion next Friday 07/18/13. She states she was not SOB when walking and thought she was in "normal" rhythm.She stated that she is not able to feel when she is in afib or not. I suggested to her that I would speak with Dr.Branch regarding her thoughts and she stated she would only have Dr.Taylor do the cardioversion.This procedure was scheduled for [facility] -patient with Dr.Branch and not [facility] Pontes.Of note, patient has post-op apt for lumpectomy with her surgeon (Dr.Manning)  the same day cardioversion is  scheduled. She is currently not on coumadin.  Dr.Branch indicated that he would speak with patient and make a disposition     C.Wilber Oliphant RN

## 2013-07-11 NOTE — Telephone Encounter (Signed)
Per 12/19 response from [facility] via staff message inj appts should be 12/29 and 6/26. inj appts for 1/26 and 2/23 cx'd. S/w pt she is aware. Also confirmed next f/u for 2/23 and next inj for 6/29. Pt had 12/29 inj.

## 2013-07-14 NOTE — Progress Notes (Signed)
EKG reviewed, normal sinus rhythm on amiodarone. Will cancel cardioversion. Please forward EKG to Dr Lovena Le as well, her QTc is 480 ms on my measure.

## 2013-07-15 ENCOUNTER — Ambulatory Visit: Payer: Medicare Other

## 2013-07-16 ENCOUNTER — Ambulatory Visit: Payer: Medicare Other

## 2013-07-17 ENCOUNTER — Ambulatory Visit: Payer: Medicare Other

## 2013-07-17 ENCOUNTER — Telehealth: Payer: Self-pay

## 2013-07-17 NOTE — Telephone Encounter (Signed)
Patient called and states that another physician prescribed Pradaxa 150mg  by Dr. Lovena Le and Dr. Beryle Beams lowered to 75mg .  She does not know if she should continue to take the 75 mg, she does not have any refills left and if she is to continue, she will need [facility].Marland Kitchen

## 2013-07-18 ENCOUNTER — Encounter (HOSPITAL_COMMUNITY): Admission: RE | Payer: Self-pay | Source: Ambulatory Visit

## 2013-07-18 ENCOUNTER — Ambulatory Visit (HOSPITAL_COMMUNITY): Admission: RE | Admit: 2013-07-18 | Payer: Medicare Other | Source: Ambulatory Visit | Admitting: Cardiology

## 2013-07-18 ENCOUNTER — Ambulatory Visit
Admission: RE | Admit: 2013-07-18 | Discharge: 2013-07-18 | Disposition: A | Discharge status: Home / Self Care | Payer: Medicare Other | Source: Ambulatory Visit | Attending: Radiation Oncology | Admitting: Radiation Oncology

## 2013-07-18 ENCOUNTER — Encounter (INDEPENDENT_AMBULATORY_CARE_PROVIDER_SITE_OTHER): Payer: Self-pay | Admitting: Surgery

## 2013-07-18 ENCOUNTER — Ambulatory Visit (INDEPENDENT_AMBULATORY_CARE_PROVIDER_SITE_OTHER): Payer: Medicare Other | Admitting: Surgery

## 2013-07-18 VITALS — BP 130/64 | HR 68 | Temp 97.4°F | Resp 18 | Ht 61.0 in | Wt 182.4 lb

## 2013-07-18 DIAGNOSIS — C50211 Malignant neoplasm of upper-inner quadrant of right female breast: Secondary | ICD-10-CM

## 2013-07-18 DIAGNOSIS — C50911 Malignant neoplasm of unspecified site of right female breast: Secondary | ICD-10-CM

## 2013-07-18 DIAGNOSIS — C50919 Malignant neoplasm of unspecified site of unspecified female breast: Secondary | ICD-10-CM

## 2013-07-18 SURGERY — Surgical Case
Anesthesia: *Unknown

## 2013-07-18 SURGERY — CARDIOVERSION
Anesthesia: Monitor Anesthesia Care

## 2013-07-18 NOTE — Telephone Encounter (Signed)
Spoke to pt to advise results/instructions. Pt understood.  

## 2013-07-18 NOTE — Patient Instructions (Signed)
Thanks for your patience.  If you need further assistance after leaving the office, please call our office and speak with a [facility] nurse.  [phone].  If you want to leave a message for Dr. Artavis Cowie, please call his office phone at [phone]. 

## 2013-07-18 NOTE — Progress Notes (Signed)
[name] 80 y.o.  Body mass index is 34.48 kg/(m^2).  Patient Active Problem List   Diagnosis Date Noted  . Right Breast Cancer 06/25/2013  . Bilateral breast cancer 05/09/2013  . Wrist fracture, closed 12/10/2012  . Triquetral fracture 10/29/2012  . Sprain of right wrist 10/29/2012  . Chronic anticoagulation 04/12/2011  . Atrial fibrillation 03/22/2011  . Arteriosclerotic cardiovascular disease (ASCVD)   . Hyperlipidemia   . Polio   . Hypertension   . GERD (gastroesophageal reflux disease)   . Carcinoma of breast   . HERPES ZOSTER 11/12/2008  . ANXIETY 07/24/2007  . PERIPHERAL NEUROPATHY 07/24/2007    Allergies  Allergen Reactions  . Atorvastatin Other (See Comments)    Myalgias  . Hydrocodone Other (See Comments)    GI distress.    Past Surgical History  Procedure Laterality Date  . Cholecystectomy  1999  . Colonoscopy  02/22/2011    Procedure: COLONOSCOPY;  Surgeon: Rogene Houston, MD;  [facility];  Service: Endoscopy;  Laterality: N/A;; performed for scant hematochezia  . Esophagogastroduodenoscopy  02/22/2011    Procedure: ESOPHAGOGASTRODUODENOSCOPY (EGD);  Surgeon: Rogene Houston, MD;  [facility];  Service: Endoscopy;  Laterality: N/A;  . Mastectomy  2001    Left;modified radical with lymph node dissection  . Port-a-cath removal      pac in and out 2002  . Dilation and curettage of uterus    . Eye surgery      both cataracts  . Breast lumpectomy with needle localization and axillary sentinel lymph node bx Right 05/27/2013    Procedure: BREAST LUMPECTOMY WITH NEEDLE LOCALIZATION AND AXILLARY SENTINEL LYMPH NODE BX;  Surgeon: Pedro Earls, MD;  Location: [facility];  Service: General;  Laterality: Right;  . Breast surgery     FAGAN,ROY, MD No diagnosis found.  All marked and ready to begin her RT.  Incisions are all healed.  Doing well. Will see back in 4 months Matt B. Hassell Done, MD, [facility] [phone] beeper [phone]  07/18/2013 2:54 PM

## 2013-07-18 NOTE — Progress Notes (Signed)
  Radiation Oncology         [phone]) [phone] ________________________________  Name: [name] MRN: [id number]  Date: 07/18/2013  DOB: [birth year]  Simulation Verification Note  Status: outpatient  NARRATIVE: The patient was brought to the treatment unit and placed in the planned treatment position. The clinical setup was verified. Then port films were obtained and uploaded to the radiation oncology medical record software.  The treatment beams were carefully compared against the planned radiation fields. The position location and shape of the radiation fields was reviewed. They targeted volume of tissue appears to be appropriately covered by the radiation beams. Organs at risk appear to be excluded as planned.  Based on my personal review, I approved the simulation verification. The patient's treatment will proceed as planned.  ------------------------------------------------  Sheral Apley Tammi Klippel, M.D.

## 2013-07-18 NOTE — Telephone Encounter (Signed)
This nurse spoke with YL to advise the pt wanted to know what Dr Harl Bowie would advise in this matter, please advise

## 2013-07-18 NOTE — Telephone Encounter (Signed)
I do not see an indication for the lower dose. I would recommend she continue with 150mg  bid.   Carlyle Dolly MD

## 2013-07-21 ENCOUNTER — Ambulatory Visit
Admission: RE | Admit: 2013-07-21 | Discharge: 2013-07-21 | Disposition: A | Discharge status: Home / Self Care | Payer: Medicare Other | Source: Ambulatory Visit | Attending: Radiation Oncology | Admitting: Radiation Oncology

## 2013-07-22 ENCOUNTER — Ambulatory Visit
Admission: RE | Admit: 2013-07-22 | Discharge: 2013-07-22 | Disposition: A | Discharge status: Home / Self Care | Payer: Medicare Other | Source: Ambulatory Visit | Attending: Radiation Oncology | Admitting: Radiation Oncology

## 2013-07-23 ENCOUNTER — Ambulatory Visit
Admission: RE | Admit: 2013-07-23 | Discharge: 2013-07-23 | Disposition: A | Discharge status: Home / Self Care | Payer: Medicare Other | Source: Ambulatory Visit | Attending: Radiation Oncology | Admitting: Radiation Oncology

## 2013-07-23 ENCOUNTER — Encounter: Payer: Self-pay | Admitting: Radiation Oncology

## 2013-07-23 VITALS — BP 139/78 | HR 62 | Resp 16 | Wt 180.9 lb

## 2013-07-23 DIAGNOSIS — C50219 Malignant neoplasm of upper-inner quadrant of unspecified female breast: Secondary | ICD-10-CM

## 2013-07-23 MED ORDER — ALRA NON-METALLIC DEODORANT (RAD-ONC)
1.0000 "application " | Freq: Once | TOPICAL | Status: AC
  Administered 2013-07-23: 1 via TOPICAL

## 2013-07-23 MED ORDER — RADIAPLEXRX EX GEL
Freq: Once | CUTANEOUS | Status: AC
  Administered 2013-07-23: 17:00:00 via TOPICAL

## 2013-07-23 NOTE — Progress Notes (Signed)
  Radiation Oncology         [phone] ________________________________  Name: [name] MRN: [id number]  Date: 07/23/2013  DOB: July 30, 1931  Weekly Radiation Therapy Management  Current Dose: 5.4 Gy     Planned Dose:  54 Gy  Narrative . . . . . . . . The patient presents for routine under treatment assessment.                                   The patient is without complaint.                                 Set-up films were reviewed.                                 The chart was checked. Physical Findings. . .  weight is 180 lb 14.4 oz (82.056 kg). Her blood pressure is 139/78 and her pulse is 62. Her respiration is 16. . Weight essentially stable.  No significant changes. Impression . . . . . . . The patient is tolerating radiation. Plan . . . . . . . . . . . . Continue treatment as planned.  ________________________________  Sheral Apley. Tammi Klippel, M.D.

## 2013-07-23 NOTE — Addendum Note (Signed)
Encounter addended by: Heywood Footman, RN on: 07/23/2013  5:28 PM<BR>     Documentation filed: Inpatient MAR

## 2013-07-23 NOTE — Progress Notes (Addendum)
States, "I feel great." Denies pain at this time. Denies skin changes within treatment field. Denies fatigue. Denies nausea, vomiting, headache, dizziness or diarrhea. No complaints at this time.  Oriented patient to staff and routine of the clinic. Educated patient reference potential side effects and management such as, fatigue and skin changes. Provided patient with radiaplex and alra then, directed upon use. Provided patient with RADIATION THERAPY AND YOU handbook then, reviewed pertinent information. All questions answered. Patient verbalized understanding of all reviewed.

## 2013-07-23 NOTE — Addendum Note (Signed)
Encounter addended by: Heywood Footman, RN on: 07/23/2013  5:22 PM<BR>     Documentation filed: Chief Complaint Section, Inpatient Patient Education, Inpatient Document Flowsheet, Notes Section, Orders

## 2013-07-24 ENCOUNTER — Ambulatory Visit
Admission: RE | Admit: 2013-07-24 | Discharge: 2013-07-24 | Disposition: A | Discharge status: Home / Self Care | Payer: Medicare Other | Source: Ambulatory Visit | Attending: Radiation Oncology | Admitting: Radiation Oncology

## 2013-07-25 ENCOUNTER — Ambulatory Visit
Admission: RE | Admit: 2013-07-25 | Discharge: 2013-07-25 | Disposition: A | Discharge status: Home / Self Care | Payer: Medicare Other | Source: Ambulatory Visit | Attending: Radiation Oncology | Admitting: Radiation Oncology

## 2013-07-28 ENCOUNTER — Ambulatory Visit
Admission: RE | Admit: 2013-07-28 | Discharge: 2013-07-28 | Disposition: A | Discharge status: Home / Self Care | Payer: Medicare Other | Source: Ambulatory Visit | Attending: Radiation Oncology | Admitting: Radiation Oncology

## 2013-07-29 ENCOUNTER — Ambulatory Visit
Admission: RE | Admit: 2013-07-29 | Discharge: 2013-07-29 | Disposition: A | Discharge status: Home / Self Care | Payer: Medicare Other | Source: Ambulatory Visit | Attending: Radiation Oncology | Admitting: Radiation Oncology

## 2013-07-30 ENCOUNTER — Ambulatory Visit
Admission: RE | Admit: 2013-07-30 | Discharge: 2013-07-30 | Disposition: A | Discharge status: Home / Self Care | Payer: Medicare Other | Source: Ambulatory Visit | Attending: Radiation Oncology | Admitting: Radiation Oncology

## 2013-07-30 ENCOUNTER — Encounter: Payer: Self-pay | Admitting: Radiation Oncology

## 2013-07-30 VITALS — BP 121/75 | HR 60 | Resp 16 | Wt 181.2 lb

## 2013-07-30 DIAGNOSIS — C50219 Malignant neoplasm of upper-inner quadrant of unspecified female breast: Secondary | ICD-10-CM

## 2013-07-30 NOTE — Progress Notes (Signed)
  Radiation Oncology         [phone] ________________________________  Name: [name] MRN: [id number]  Date: 07/30/2013  DOB: August 23, 1931  Weekly Radiation Therapy Management  Current Dose: 14.4 Gy     Planned Dose:  50.4 Gy  Narrative . . . . . . . . The patient presents for routine under treatment assessment.                                   The patient is without complaint.  Denies pain at this time. Denies nausea, vomiting, headache, or dizziness. Patient denies skin changes within right breast treatment field. Reports using radiaplex and alra as directed. Full ROM of right upper extremity noted. RUE without edema. Patient denies fatigue and goes on to explain she even went shopping yesterday. Patient states, "I feel great today."                                 Set-up films were reviewed.                                 The chart was checked. Physical Findings. . .  weight is 181 lb 3.2 oz (82.192 kg). Her blood pressure is 121/75 and her pulse is 60. Her respiration is 16. . Weight essentially stable.  No significant changes. Impression . . . . . . . The patient is tolerating radiation. Plan . . . . . . . . . . . . Continue treatment as planned.  ________________________________  Sheral Apley. Tammi Klippel, M.D.

## 2013-07-30 NOTE — Progress Notes (Signed)
Weight stable. Vitals WDL. Denies pain at this time. Denies nausea, vomiting, headache, or dizziness. Patient denies skin changes within right breast treatment field. Reports using radiaplex and alra as directed. Full ROM of right upper extremity noted. RUE without edema. Patient denies fatigue and goes on to explain she even went shopping yesterday. Patient states, "I feel great today."

## 2013-07-31 ENCOUNTER — Ambulatory Visit
Admission: RE | Admit: 2013-07-31 | Discharge: 2013-07-31 | Disposition: A | Discharge status: Home / Self Care | Payer: Medicare Other | Source: Ambulatory Visit | Attending: Radiation Oncology | Admitting: Radiation Oncology

## 2013-08-01 ENCOUNTER — Ambulatory Visit
Admission: RE | Admit: 2013-08-01 | Discharge: 2013-08-01 | Disposition: A | Discharge status: Home / Self Care | Payer: Medicare Other | Source: Ambulatory Visit | Attending: Radiation Oncology | Admitting: Radiation Oncology

## 2013-08-01 ENCOUNTER — Telehealth: Payer: Self-pay | Admitting: *Deleted

## 2013-08-01 NOTE — Telephone Encounter (Signed)
Dr Lovena Le recommends pt stop Pradaxa and start Eliquis 2.5mg  bid after discussing her case with Dr Beryle Beams.  I have left the patient a message to call me to discuss

## 2013-08-04 ENCOUNTER — Ambulatory Visit: Payer: Medicare Other

## 2013-08-04 ENCOUNTER — Ambulatory Visit
Admission: RE | Admit: 2013-08-04 | Discharge: 2013-08-04 | Disposition: A | Discharge status: Home / Self Care | Payer: Medicare Other | Source: Ambulatory Visit | Attending: Radiation Oncology | Admitting: Radiation Oncology

## 2013-08-04 NOTE — Telephone Encounter (Signed)
Spoke with patient and she is aware of the change, however Dr Harl Bowie advised her to stay on the Pradaxa 150mg  bid  I let her know i would discuss with Dr Lovena Le tomorrow and call her back

## 2013-08-04 NOTE — Telephone Encounter (Signed)
Patient is returning your call. Please call back. Patient is leaving at 9:30am due to radiation.

## 2013-08-05 ENCOUNTER — Ambulatory Visit
Admission: RE | Admit: 2013-08-05 | Discharge: 2013-08-05 | Disposition: A | Discharge status: Home / Self Care | Payer: Medicare Other | Source: Ambulatory Visit | Attending: Radiation Oncology | Admitting: Radiation Oncology

## 2013-08-06 ENCOUNTER — Ambulatory Visit: Admission: RE | Admit: 2013-08-06 | Payer: Medicare Other | Source: Ambulatory Visit

## 2013-08-07 ENCOUNTER — Ambulatory Visit
Admission: RE | Admit: 2013-08-07 | Discharge: 2013-08-07 | Disposition: A | Discharge status: Home / Self Care | Payer: Medicare Other | Source: Ambulatory Visit | Attending: Radiation Oncology | Admitting: Radiation Oncology

## 2013-08-07 MED ORDER — APIXABAN 2.5 MG PO TABS: 2.5000 mg | ORAL_TABLET | Freq: Two times a day (BID) | ORAL | Status: DC

## 2013-08-07 NOTE — Telephone Encounter (Signed)
Spoke with patient and we are going to changer her to Eliquis 2.5mg  twice daily

## 2013-08-07 NOTE — Telephone Encounter (Signed)
New problem    Pt returning your call. Please call today pt won't be home tomorrow.

## 2013-08-08 ENCOUNTER — Ambulatory Visit
Admission: RE | Admit: 2013-08-08 | Discharge: 2013-08-08 | Disposition: A | Discharge status: Home / Self Care | Payer: Medicare Other | Source: Ambulatory Visit | Attending: Radiation Oncology | Admitting: Radiation Oncology

## 2013-08-08 ENCOUNTER — Encounter: Payer: Self-pay | Admitting: Radiation Oncology

## 2013-08-08 VITALS — BP 123/72 | HR 66 | Resp 16 | Wt 182.4 lb

## 2013-08-08 DIAGNOSIS — C50219 Malignant neoplasm of upper-inner quadrant of unspecified female breast: Secondary | ICD-10-CM

## 2013-08-08 NOTE — Progress Notes (Signed)
  Radiation Oncology         [phone]) [phone] ________________________________  Name: [name] MRN: [id number]  Date: 08/08/2013  DOB: March 08, 1932  Weekly Radiation Therapy Management  Current Dose: 25.2 Gy     Planned Dose:  60.4 Gy  Narrative . . . . . . . . The patient presents for routine under treatment assessment.                                   The patient is without complaint.                                 Set-up films were reviewed.                                 The chart was checked. Physical Findings. . .  weight is 182 lb 6.4 oz (82.736 kg). Her blood pressure is 123/72 and her pulse is 66. Her respiration is 16. . Weight essentially stable.  No significant changes. Impression . . . . . . . The patient is tolerating radiation. Plan . . . . . . . . . . . . Continue treatment as planned.  ________________________________  Sheral Apley. Tammi Klippel, M.D.

## 2013-08-08 NOTE — Progress Notes (Signed)
Mild hyperpigmentation of right supraclavicular area noted without breaks in the skin or desquamation. Encouraged patient to continue to use radiaplex bid. Denies fatigue. She remains active knitting and going out with friends. No edema of right arm noted. No decreased ROM of upper extremities.

## 2013-08-11 ENCOUNTER — Ambulatory Visit
Admission: RE | Admit: 2013-08-11 | Discharge: 2013-08-11 | Disposition: A | Discharge status: Home / Self Care | Payer: Medicare Other | Source: Ambulatory Visit | Attending: Radiation Oncology | Admitting: Radiation Oncology

## 2013-08-12 ENCOUNTER — Ambulatory Visit
Admission: RE | Admit: 2013-08-12 | Discharge: 2013-08-12 | Disposition: A | Discharge status: Home / Self Care | Payer: Medicare Other | Source: Ambulatory Visit | Attending: Radiation Oncology | Admitting: Radiation Oncology

## 2013-08-13 ENCOUNTER — Ambulatory Visit
Admission: RE | Admit: 2013-08-13 | Discharge: 2013-08-13 | Disposition: A | Discharge status: Home / Self Care | Payer: Medicare Other | Source: Ambulatory Visit | Attending: Radiation Oncology | Admitting: Radiation Oncology

## 2013-08-14 ENCOUNTER — Other Ambulatory Visit: Payer: Self-pay | Admitting: Obstetrics and Gynecology

## 2013-08-14 ENCOUNTER — Ambulatory Visit
Admission: RE | Admit: 2013-08-14 | Discharge: 2013-08-14 | Disposition: A | Discharge status: Home / Self Care | Payer: Medicare Other | Source: Ambulatory Visit | Attending: Radiation Oncology | Admitting: Radiation Oncology

## 2013-08-15 ENCOUNTER — Ambulatory Visit
Admission: RE | Admit: 2013-08-15 | Discharge: 2013-08-15 | Disposition: A | Discharge status: Home / Self Care | Payer: Medicare Other | Source: Ambulatory Visit | Attending: Radiation Oncology | Admitting: Radiation Oncology

## 2013-08-15 ENCOUNTER — Encounter: Payer: Self-pay | Admitting: Radiation Oncology

## 2013-08-15 VITALS — BP 110/67 | HR 69 | Resp 16 | Wt 181.0 lb

## 2013-08-15 DIAGNOSIS — C50219 Malignant neoplasm of upper-inner quadrant of unspecified female breast: Secondary | ICD-10-CM

## 2013-08-15 MED ORDER — RADIAPLEXRX EX GEL
Freq: Once | CUTANEOUS | Status: AC
  Administered 2013-08-15: 12:00:00 via TOPICAL

## 2013-08-15 NOTE — Progress Notes (Signed)
  Radiation Oncology         [phone] ________________________________  Name: [name] MRN: [id number]  Date: 08/15/2013  DOB: 26-May-1932  Weekly Radiation Therapy Management  Current Dose: 34.2 Gy     Planned Dose:  60.4 Gy  Narrative . . . . . . . . The patient presents for routine under treatment assessment.  Denies fatigue. She remains active. No edema of right arm noted. No decreased ROM of upper extremities.                                 The patient is without complaint.                                 Set-up films were reviewed.                                 The chart was checked. Physical Findings. . .  weight is 181 lb (82.101 kg). Her blood pressure is 110/67 and her pulse is 69. Her respiration is 16. . Weight essentially stable.Mild hyperpigmentation/erythema of right supraclavicular area noted without breaks in the skin or desquamation.   No significant changes. Impression . . . . . . . The patient is tolerating radiation. Plan . . . . . . . . . . . . Continue treatment as planned.  Provided patient with an addition tube of radiaplex.   ________________________________  Sheral Apley Tammi Klippel, M.D.

## 2013-08-15 NOTE — Addendum Note (Signed)
Encounter addended by: Heywood Footman, RN on: 08/15/2013 12:20 PM<BR>     Documentation filed: Inpatient MAR, Orders

## 2013-08-15 NOTE — Progress Notes (Signed)
Mild hyperpigmentation of right supraclavicular area noted without breaks in the skin or desquamation. Provided patient with an addition tube of radiaplex. Denies fatigue. She remains active. No edema of right arm noted. No decreased ROM of upper extremities.

## 2013-08-18 ENCOUNTER — Ambulatory Visit
Admission: RE | Admit: 2013-08-18 | Discharge: 2013-08-18 | Disposition: A | Discharge status: Home / Self Care | Payer: Medicare Other | Source: Ambulatory Visit | Attending: Radiation Oncology | Admitting: Radiation Oncology

## 2013-08-19 ENCOUNTER — Ambulatory Visit
Admission: RE | Admit: 2013-08-19 | Discharge: 2013-08-19 | Disposition: A | Discharge status: Home / Self Care | Payer: Medicare Other | Source: Ambulatory Visit | Attending: Radiation Oncology | Admitting: Radiation Oncology

## 2013-08-20 ENCOUNTER — Ambulatory Visit
Admission: RE | Admit: 2013-08-20 | Discharge: 2013-08-20 | Disposition: A | Discharge status: Home / Self Care | Payer: Medicare Other | Source: Ambulatory Visit | Attending: Radiation Oncology | Admitting: Radiation Oncology

## 2013-08-20 ENCOUNTER — Encounter: Payer: Self-pay | Admitting: Radiation Oncology

## 2013-08-20 VITALS — BP 119/74 | HR 66 | Resp 16 | Wt 180.9 lb

## 2013-08-20 DIAGNOSIS — C50219 Malignant neoplasm of upper-inner quadrant of unspecified female breast: Secondary | ICD-10-CM

## 2013-08-20 NOTE — Progress Notes (Signed)
Hyperpigmentation without breaks in skin noted right anterior neck. Patient reports this area itches. Encouraged patient to continue with radiaplex as directed plus apply hydrocortisone 1%. Also, right upper back hyperpigmentation noted. Applied aquaphor to entire back. Denies pain at this time. Denies fatigue. No edema of right arm noted. No decreased ROM of right upper extremity noted.

## 2013-08-20 NOTE — Progress Notes (Signed)
  Radiation Oncology         [phone] ________________________________  Name: [name] MRN: [id number]  Date: 08/20/2013  DOB: Nov 04, 1931  Weekly Radiation Therapy Management  Current Dose: 39.6 Gy     Planned Dose:  60.4 Gy  Narrative . . . . . . . . The patient presents for routine under treatment assessment.                                   The patient is without complaint.                                 Set-up films were reviewed.                                 The chart was checked. Physical Findings. . .  weight is 180 lb 14.4 oz (82.056 kg). Her blood pressure is 119/74 and her pulse is 66. Her respiration is 16. . Weight essentially stable.  Erythema no desquamation. Impression . . . . . . . The patient is tolerating radiation. Plan . . . . . . . . . . . . Continue treatment as planned.  ________________________________  Sheral Apley. Tammi Klippel, M.D.

## 2013-08-21 ENCOUNTER — Ambulatory Visit
Admission: RE | Admit: 2013-08-21 | Discharge: 2013-08-21 | Disposition: A | Discharge status: Home / Self Care | Payer: Medicare Other | Source: Ambulatory Visit | Attending: Radiation Oncology | Admitting: Radiation Oncology

## 2013-08-21 ENCOUNTER — Ambulatory Visit: Payer: Medicare Other | Admitting: Radiation Oncology

## 2013-08-22 ENCOUNTER — Ambulatory Visit
Admission: RE | Admit: 2013-08-22 | Discharge: 2013-08-22 | Disposition: A | Discharge status: Home / Self Care | Payer: Medicare Other | Source: Ambulatory Visit | Attending: Radiation Oncology | Admitting: Radiation Oncology

## 2013-08-22 ENCOUNTER — Encounter: Payer: Self-pay | Admitting: Oncology

## 2013-08-22 ENCOUNTER — Telehealth: Payer: Self-pay | Admitting: Oncology

## 2013-08-22 ENCOUNTER — Ambulatory Visit (HOSPITAL_BASED_OUTPATIENT_CLINIC_OR_DEPARTMENT_OTHER): Payer: Medicare Other | Admitting: Oncology

## 2013-08-22 VITALS — BP 133/69 | HR 67 | Temp 97.0°F | Resp 18 | Ht 61.0 in | Wt 182.5 lb

## 2013-08-22 DIAGNOSIS — C50219 Malignant neoplasm of upper-inner quadrant of unspecified female breast: Secondary | ICD-10-CM

## 2013-08-22 DIAGNOSIS — Z170 Estrogen receptor positive status [ER+]: Secondary | ICD-10-CM

## 2013-08-22 DIAGNOSIS — N898 Other specified noninflammatory disorders of vagina: Secondary | ICD-10-CM

## 2013-08-22 DIAGNOSIS — C50919 Malignant neoplasm of unspecified site of unspecified female breast: Secondary | ICD-10-CM

## 2013-08-22 DIAGNOSIS — I4891 Unspecified atrial fibrillation: Secondary | ICD-10-CM

## 2013-08-22 NOTE — Telephone Encounter (Signed)
gave pt appt for lab and MD for June a former Dr. Beryle Beams pt

## 2013-08-22 NOTE — Progress Notes (Signed)
Hematology and Oncology Follow Up Visit  [name] [id number] 12/30/1931 80 y.o. 08/22/2013 1:00 PM   Principle Diagnosis: Encounter Diagnoses  Name Primary?  . Carcinoma of breast   . Right Breast Cancer Yes     Interim History:  Short interval followup visit for this pleasant 80 year old woman recently diagnosed with a second primary invasive ductal cell cancer of the right breast 1.5 cm, 100% ER and 100% PR positive, HER-2 negative, Ki-67 55%. One of 2 sentinel lymph nodes grossly positive for cancer. She elected for conservative treatment despite previous left mastectomy and underwent a partial mastectomy with sentinel lymph node procedure on 05/27/2013 and is midcourse through  breast radiation by Dr. Tammi Klippel. Please see my summary note dated 06/26/2013 for additional details.  Overall she is tolerating the radiation well. She is having some cutaneous reaction on the upper right chest and the upper right back.  Her only other complaint today is sudden onset of low grade vaginal bleeding. She immediately called her gynecologist Dr. Harrington Challenger. She had a normal exam and a negative Pap smear. Bleeding was self-limited and has not recurred. Of note, she is not currently on any hormonal therapy but she is on a blood thinner to prevent stroke in view of chronic atrial fibrillation.  Medications: reviewed  Allergies:  Allergies  Allergen Reactions  . Atorvastatin Other (See Comments)    Myalgias  . Hydrocodone Other (See Comments)    GI distress.    Review of Systems: Hematology:  See above ENT ROS: No sore throat Breast ROS: See above Respiratory ROS: No cough or dyspnea Cardiovascular ROS: No chest pain or palpitations  Gastrointestinal ROS:   No abdominal pain or change in bowel habit Genito-Urinary ROS: See above Musculoskeletal ROS: No new bone pain Neurological ROS: No headache or change in vision Dermatological ROS: Cutaneous reaction to radiation involving upper right  chest and upper back Remaining ROS negative:   Physical Exam: Blood pressure 133/69, pulse 67, temperature 97 F (36.1 C), temperature source Oral, resp. rate 18, height 5' 1" (1.549 m), weight 182 lb 8 oz (82.781 kg), SpO2 95.00%. Wt Readings from Last 3 Encounters:  08/22/13 182 lb 8 oz (82.781 kg)  08/20/13 180 lb 14.4 oz (82.056 kg)  08/15/13 181 lb (82.101 kg)     General appearance: Well-nourished Caucasian woman looking younger than her stated age HENNT: Pharynx no erythema, exudate, mass, or ulcer. No thyromegaly or thyroid nodules Lymph nodes: No cervical, supraclavicular, or axillary lymphadenopathy Breasts: Surgical and radiation changes in the right breast. Left mastectomy. No chest wall lesions. Lungs: Clear to auscultation, resonant to percussion throughout Heart: Regular rhythm, no murmur, no gallop, no rub, no click, no edema Abdomen: Soft, nontender, normal bowel sounds, no mass, no organomegaly Extremities: No edema, no calf tenderness Musculoskeletal: no joint deformities GU:  Vascular: Carotid pulses 2+, no bruits,  Neurologic: Alert, oriented, PERRLA,   cranial nerves grossly normal, motor strength 5 over 5, reflexes 1+ symmetric, upper body coordination normal, gait normal, Skin: No rash or ecchymosis  Lab Results: CBC W/Diff    Component Value Date/Time   WBC 6.4 07/11/2013 0939   WBC 5.6 06/26/2013 0924   RBC 5.65* 07/11/2013 0939   RBC 5.04 06/26/2013 0924   HGB 14.5 07/11/2013 0939   HGB 13.0 06/26/2013 0924   HCT 45.2 07/11/2013 0939   HCT 40.2 06/26/2013 0924   PLT 294 07/11/2013 0939   PLT 271 06/26/2013 0924   MCV 80.0 07/11/2013 0939  MCV 79.7 06/26/2013 0924   MCH 25.7* 07/11/2013 0939   MCH 25.8 06/26/2013 0924   MCHC 32.1 07/11/2013 0939   MCHC 32.4 06/26/2013 0924   RDW 16.6* 07/11/2013 0939   RDW 16.9* 06/26/2013 0924   LYMPHSABS 2.0 07/11/2013 0939   LYMPHSABS 1.7 06/26/2013 0924   MONOABS 0.6 07/11/2013 0939   MONOABS 0.7 06/26/2013 0924   EOSABS  0.1 07/11/2013 0939   EOSABS 0.1 06/26/2013 0924   BASOSABS 0.0 07/11/2013 0939   BASOSABS 0.1 06/26/2013 0924     Chemistry      Component Value Date/Time   NA 139 07/11/2013 0939   NA 140 06/26/2013 0924   K 4.9 07/11/2013 0939   K 4.4 06/26/2013 0924   CL 102 07/11/2013 0939   CO2 27 07/11/2013 0939   CO2 27 06/26/2013 0924   BUN 15 07/11/2013 0939   BUN 14.6 06/26/2013 0924   CREATININE 0.95 07/11/2013 0939   CREATININE 0.9 06/26/2013 0924   CREATININE 0.91 05/22/2013 1115      Component Value Date/Time   CALCIUM 9.1 07/11/2013 0939   CALCIUM 9.5 06/26/2013 0924   ALKPHOS 91 06/26/2013 0924   ALKPHOS 90 03/22/2011 1429   AST 18 06/26/2013 0924   AST 17 03/22/2011 1429   ALT 9 06/26/2013 0924   ALT 8 03/22/2011 1429   BILITOT 0.76 06/26/2013 0924   BILITOT 0.8 03/22/2011 1429       Impression:  #1. Metachronous primary invasive breast cancers: Initial stage II, 6 node positive, ER/PR positive, HER-2 positive, cancer of the left breast diagnosed October 2001 treated with mastectomy followed by 6 cycles of Cytoxan, Adriamycin, Taxotere chemotherapy then 5 years of Arimidex hormonal therapy completing her planned treatment in June 2011. Second primary T1, N1, M0 strongly ER/PR positive, HER-2 negative, grade 2, invasive ductal carcinoma right breast and treated as outlined above. Plan: She will begin Aromasin following completion of radiation. I will transition her care to Dr. Gunnar Bulla Magrinat  #2. Chronic anticoagulation secondary to atrial fibrillation She has recently changed from Pradaxa to [place] in view of her age and renal function.  #3. Low-grade, self-limited, vaginal bleeding with normal GYN exam and Pap smear. She'll continue followup with her GYN. I told her that Aromasin should not be a problem since it does not stimulate the endometrium.   CC: Patient Care Team: Asencion Noble, MD as PCP - General (Internal Medicine) Evans Lance, MD (Cardiology) Yehuda Savannah, MD  (Cardiology) Biagio Borg, MD (Internal Medicine) Annia Belt, MD (Hematology and Oncology) Asencion Noble, MD as Consulting Physician (Internal Medicine) Lora Paula, MD as Consulting Physician (Radiation Oncology)   Annia Belt, MD 2/13/20151:00 PM

## 2013-08-25 ENCOUNTER — Ambulatory Visit
Admission: RE | Admit: 2013-08-25 | Discharge: 2013-08-25 | Disposition: A | Discharge status: Home / Self Care | Payer: Medicare Other | Source: Ambulatory Visit | Attending: Radiation Oncology | Admitting: Radiation Oncology

## 2013-08-26 ENCOUNTER — Ambulatory Visit
Admission: RE | Admit: 2013-08-26 | Discharge: 2013-08-26 | Disposition: A | Discharge status: Home / Self Care | Payer: Medicare Other | Source: Ambulatory Visit | Attending: Radiation Oncology | Admitting: Radiation Oncology

## 2013-08-26 ENCOUNTER — Ambulatory Visit: Payer: Medicare Other

## 2013-08-27 ENCOUNTER — Encounter: Payer: Self-pay | Admitting: Radiation Oncology

## 2013-08-27 ENCOUNTER — Ambulatory Visit
Admission: RE | Admit: 2013-08-27 | Discharge: 2013-08-27 | Disposition: A | Discharge status: Home / Self Care | Payer: Medicare Other | Source: Ambulatory Visit | Attending: Radiation Oncology | Admitting: Radiation Oncology

## 2013-08-27 VITALS — BP 126/71 | HR 62 | Resp 16 | Wt 183.1 lb

## 2013-08-27 DIAGNOSIS — C50219 Malignant neoplasm of upper-inner quadrant of unspecified female breast: Secondary | ICD-10-CM

## 2013-08-27 NOTE — Progress Notes (Signed)
Hyperpigmentation of right breast, right supra clav, and right upper back noted without breaks in the skin. Patient denies fatigue or pain. Patient reports she often feels lightheaded. Blood pressure normal. Encouraged patient to contact PCP about feeling light headed. Also, patient reports a "sinus" frontal headache that she woke up with this morning. Denies diplopia, nausea or ringing in the ears.

## 2013-08-27 NOTE — Progress Notes (Signed)
  Radiation Oncology         [phone] ________________________________  Name: [name] MRN: [id number]  Date: 08/27/2013  DOB: 04/09/1932  Weekly Radiation Therapy Management  Current Dose: 48.6 Gy     Planned Dose:  60.4 Gy  Narrative . . . . . . . . The patient presents for routine under treatment assessment.                                   The patient is without complaint.                                 Set-up films were reviewed.                                 The chart was checked. Physical Findings. . .  weight is 183 lb 1.6 oz (83.054 kg). Her blood pressure is 126/71 and her pulse is 62. Her respiration is 16. . Weight essentially stable.  No significant changes. Impression . . . . . . . The patient is tolerating radiation. Plan . . . . . . . . . . . . Continue treatment as planned.  ________________________________  Sheral Apley. Tammi Klippel, M.D.

## 2013-08-28 ENCOUNTER — Ambulatory Visit: Payer: Medicare Other | Admitting: Radiation Oncology

## 2013-08-28 ENCOUNTER — Ambulatory Visit: Payer: Medicare Other

## 2013-08-29 ENCOUNTER — Ambulatory Visit
Admission: RE | Admit: 2013-08-29 | Discharge: 2013-08-29 | Disposition: A | Discharge status: Home / Self Care | Payer: Medicare Other | Source: Ambulatory Visit | Attending: Radiation Oncology | Admitting: Radiation Oncology

## 2013-08-29 ENCOUNTER — Ambulatory Visit: Payer: Medicare Other

## 2013-09-01 ENCOUNTER — Ambulatory Visit: Payer: Medicare Other

## 2013-09-01 ENCOUNTER — Ambulatory Visit
Admission: RE | Admit: 2013-09-01 | Discharge: 2013-09-01 | Disposition: A | Discharge status: Home / Self Care | Payer: Medicare Other | Source: Ambulatory Visit | Attending: Radiation Oncology | Admitting: Radiation Oncology

## 2013-09-02 ENCOUNTER — Ambulatory Visit: Payer: Medicare Other

## 2013-09-03 ENCOUNTER — Ambulatory Visit: Payer: Medicare Other

## 2013-09-03 ENCOUNTER — Ambulatory Visit
Admission: RE | Admit: 2013-09-03 | Discharge: 2013-09-03 | Disposition: A | Discharge status: Home / Self Care | Payer: Medicare Other | Source: Ambulatory Visit | Attending: Radiation Oncology | Admitting: Radiation Oncology

## 2013-09-04 ENCOUNTER — Ambulatory Visit
Admission: RE | Admit: 2013-09-04 | Discharge: 2013-09-04 | Disposition: A | Discharge status: Home / Self Care | Payer: Medicare Other | Source: Ambulatory Visit | Attending: Radiation Oncology | Admitting: Radiation Oncology

## 2013-09-04 ENCOUNTER — Ambulatory Visit: Payer: Medicare Other

## 2013-09-05 ENCOUNTER — Ambulatory Visit: Payer: Medicare Other

## 2013-09-05 ENCOUNTER — Ambulatory Visit
Admission: RE | Admit: 2013-09-05 | Discharge: 2013-09-05 | Disposition: A | Discharge status: Home / Self Care | Payer: Medicare Other | Source: Ambulatory Visit | Attending: Radiation Oncology | Admitting: Radiation Oncology

## 2013-09-05 ENCOUNTER — Encounter: Payer: Self-pay | Admitting: Radiation Oncology

## 2013-09-05 VITALS — BP 143/78 | HR 70 | Temp 97.4°F

## 2013-09-05 DIAGNOSIS — C50219 Malignant neoplasm of upper-inner quadrant of unspecified female breast: Secondary | ICD-10-CM

## 2013-09-05 MED ORDER — RADIAPLEXRX EX GEL
Freq: Once | CUTANEOUS | Status: AC
  Administered 2013-09-05: 11:00:00 via TOPICAL

## 2013-09-05 NOTE — Progress Notes (Signed)
  Radiation Oncology         [phone] ________________________________  Name: [name] MRN: [id number]  Date: 09/05/2013  DOB: 16-Dec-1931  Weekly Radiation Therapy Management  Current Dose: 58.4 Gy     Planned Dose:  60.4 Gy  Narrative . . . . . . . . The patient presents for routine under treatment assessment.                                   Patient denies fatigue or pain. Reports that she continues to use radiaplex. Provided her with an additional tube. Provided her with an appointment card to return for a one month follow up since she completes on Monday The patient is without complaint.                                 Set-up films were reviewed.                                 The chart was checked. Physical Findings. . .  temperature is 97.4 F (36.3 C). Her blood pressure is 143/78 and her pulse is 70. Her oxygen saturation is 98%. . Hyperpigmentation of right breast, right supra clav, and right upper back noted without breaks in the skin. Weight essentially stable.  No significant changes. Impression . . . . . . . The patient is tolerating radiation. Plan . . . . . . . . . . . . Continue treatment as planned.  Complete Monday and follow-up in a month  ________________________________  Sheral Apley. Tammi Klippel, M.D.

## 2013-09-05 NOTE — Progress Notes (Signed)
Hyperpigmentation of right breast, right supra clav, and right upper back noted without breaks in the skin. Patient denies fatigue or pain. Reports that she continues to use radiaplex. Provided her with an additional tube. Provided her with an appointment card to return for a one month follow up since she completes on Monday.

## 2013-09-08 ENCOUNTER — Encounter: Payer: Self-pay | Admitting: Radiation Oncology

## 2013-09-08 ENCOUNTER — Other Ambulatory Visit: Payer: Self-pay | Admitting: Cardiology

## 2013-09-08 ENCOUNTER — Ambulatory Visit
Admission: RE | Admit: 2013-09-08 | Discharge: 2013-09-08 | Disposition: A | Discharge status: Home / Self Care | Payer: Medicare Other | Source: Ambulatory Visit | Attending: Radiation Oncology | Admitting: Radiation Oncology

## 2013-09-08 ENCOUNTER — Ambulatory Visit: Payer: Medicare Other

## 2013-09-09 ENCOUNTER — Ambulatory Visit: Payer: Medicare Other

## 2013-09-15 ENCOUNTER — Ambulatory Visit (INDEPENDENT_AMBULATORY_CARE_PROVIDER_SITE_OTHER): Payer: Medicare Other | Admitting: Internal Medicine

## 2013-09-15 ENCOUNTER — Encounter: Payer: Self-pay | Admitting: Internal Medicine

## 2013-09-15 VITALS — BP 133/82 | HR 66 | Ht 61.5 in | Wt 184.0 lb

## 2013-09-15 DIAGNOSIS — I4891 Unspecified atrial fibrillation: Secondary | ICD-10-CM

## 2013-09-15 MED ORDER — AMIODARONE HCL 200 MG PO TABS: 200.0000 mg | ORAL_TABLET | Freq: Every day | ORAL | Status: DC

## 2013-09-15 NOTE — Patient Instructions (Addendum)
Your physician recommends that you schedule a follow-up appointment in: 6 months with Dr Knox Saliva will receive a reminder letter two months in advance reminding you to call and schedule your appointment. If you don't receive this letter, please contact our office.  Your physician has recommended you make the following change in your medication:  Decreased Amiodarone to 200 mg daily

## 2013-09-15 NOTE — Assessment & Plan Note (Signed)
She has reverted to NSR. She will continue amiodarone but I have asked the patient to reduce her dose to 200 mg daily.

## 2013-09-15 NOTE — Progress Notes (Signed)
HPI [name] returns today for followup. She is a very pleasant 80 year old woman with a history of paroxysmal atrial fibrillation which has become persistent. She was on flecainide for many years but eventually, the flecainide became ineffective. The patient has not had syncope. At rest, she feels well. With exertion, she is now improved after starting amiodarone and reverting back to NSR spontaneously. She notes mild peripheral edema. She underwent mastectomy and XRT since I last saw the patient. Allergies  Allergen Reactions  . Atorvastatin Other (See Comments)    Myalgias  . Hydrocodone Other (See Comments)    GI distress.     Current Outpatient Prescriptions  Medication Sig Dispense Refill  . ALPRAZolam (XANAX) 0.25 MG tablet 0.25 mg. TAKE 1/2 TABLET AS NEEDED      . amiodarone (PACERONE) 200 MG tablet Take 1 tablet (200 mg total) by mouth 2 (two) times daily.  90 tablet  3  . apixaban (ELIQUIS) 2.5 MG TABS tablet Take 1 tablet (2.5 mg total) by mouth 2 (two) times daily.  60 tablet  11  . exemestane (AROMASIN) 25 MG tablet Take 1 tablet (25 mg total) by mouth daily after breakfast.  90 tablet  3  . furosemide (LASIX) 40 MG tablet Take 40 mg by mouth as needed.      . meclizine (ANTIVERT) 25 MG tablet Take 25 mg by mouth 3 (three) times daily as needed for dizziness.       . non-metallic deodorant Jethro Poling) MISC Apply 1 application topically daily as needed.      Marland Kitchen OVER THE COUNTER MEDICATION Take 2 tablets by mouth daily. Sun Cherrella(sp?)  Herb supplement for skin      . potassium chloride SA (K-DUR,KLOR-CON) 20 MEQ tablet Take 1 tablet (20 mEq total) by mouth daily.  30 tablet  3  . pravastatin (PRAVACHOL) 20 MG tablet TAKE 1 TABLET EVERY DAY  30 tablet  10  . ramipril (ALTACE) 2.5 MG tablet Take 2.5 mg by mouth daily.      . ranitidine (ZANTAC) 150 MG tablet Take 150 mg by mouth at bedtime.      . verapamil (VERELAN PM) 180 MG 24 hr capsule TAKE ONE CAPSULE BY MOUTH ONCE  A DAY  90 capsule  2   No current facility-administered medications for this visit.     Past Medical History  Diagnosis Date  . Herpes zoster   . Arteriosclerotic cardiovascular disease (ASCVD)      cath Feb '07- no obstructive dz - 20% proximal LAD only; EF 65%; possible coronary artery spasm  . Hyperlipidemia     Lipid profile in 11/2008:282, 176, 64, 201  . Polio 1946    at age 76  . Peripheral neuropathy   . Anxiety   . Hypertension     diastolic dysfunction; normal CMet and TSH in 11/2009; normal CBC in 04/2010  . GERD (gastroesophageal reflux disease)     hiatal hernia  . Rectal bleed   . Chest pain     Long-standing and atypical  . Atrial fibrillation     Paroxysmal on event recorder in 2009; regular supraventricular tachycardia at a rate of 150 also identified on that study representing either atrial flutter or PSVT; onset of persistent AF in 03/2011  . Chronic anticoagulation 04/12/2011  . Renal insufficiency   . Carcinoma of breast 2001    Left mastectomy with positive nodes  . Malignant neoplasm of breast (female), unspecified site 05/27/2013  right breast lumpectomy with positive node    ROS:   All systems reviewed and negative except as noted in the HPI.   Past Surgical History  Procedure Laterality Date  . Cholecystectomy  1999  . Colonoscopy  02/22/2011    Procedure: COLONOSCOPY;  Surgeon: Rogene Houston, MD;  Location: [facility];  Service: Endoscopy;  Laterality: N/A;; performed for scant hematochezia  . Esophagogastroduodenoscopy  02/22/2011    Procedure: ESOPHAGOGASTRODUODENOSCOPY (EGD);  Surgeon: Rogene Houston, MD;  Location: [facility];  Service: Endoscopy;  Laterality: N/A;  . Mastectomy  2001    Left;modified radical with lymph node dissection  . Port-a-cath removal      pac in and out 2002  . Dilation and curettage of uterus    . Eye surgery      both cataracts  . Breast lumpectomy with needle localization and axillary sentinel lymph  node bx Right 05/27/2013    Procedure: BREAST LUMPECTOMY WITH NEEDLE LOCALIZATION AND AXILLARY SENTINEL LYMPH NODE BX;  Surgeon: Pedro Earls, MD;  Location: [facility];  Service: General;  Laterality: Right;  . Breast surgery       Family History  Problem Relation Age of Onset  . Cancer Sister     lung  . Cancer Brother     lipoma  . Cancer Brother     kidney     History   Social History  . Marital Status: Widowed    Spouse Name: N/A    Number of Children: 2  . Years of Education: N/A   Occupational History  . homemaker    Social History Main Topics  . Smoking status: Never Smoker   . Smokeless tobacco: Never Used  . Alcohol Use: No  . Drug Use: No  . Sexual Activity: Not Currently   Other Topics Concern  . Not on file   Social History Narrative  . No narrative on file     BP 133/82  Pulse 66  Ht 5' 1.5" (1.562 m)  Wt 184 lb (83.462 kg)  BMI 34.21 kg/m2  Physical Exam:  Well appearing 80 year old woman,NAD HEENT: Unremarkable Neck:  No JVD, no thyromegally Back:  No CVA tenderness Lungs:  Clear with no wheezes, rales, or rhonchi. HEART:  Regular rate rhythm, no murmurs, no rubs, no clicks Abd:  soft, obese, positive bowel sounds, no organomegally, no rebound, no guarding Ext:  2 plus pulses, no edema, no cyanosis, no clubbing Skin:  No rashes no nodules Neuro:  CN II through XII intact, motor grossly intact  EKG NSR  Assess/Plan:

## 2013-09-15 NOTE — Progress Notes (Signed)
  Radiation Oncology         [phone] ________________________________  Name: [name] MRN: [id number]  Date: 09/08/2013  DOB: [birth year]  End of Treatment Note  Diagnosis:   80 year old woman with T1c (1.5 cm) N1a (1/2 SLN positive) M0 estrogen receptor positive grade I invasive ductal carcinoma of the left breast - Stage IIA  Indication for treatment:  Curative, breast conservation       Radiation treatment dates:   07/21/2013-09/08/2013  Site/dose:   1.  The whole right breast, supraclavicular region, and axilla were treated to 50.4 Gy in 28 fraction  2.  The lumpectomy site was boosted to 60.4 Gy with 5 additional fractions of 2 Gy.  Beams/energy:    1.  The whole right breast, supraclavicular region, and axilla were treated using a standard 4 field technique.  A.  the whole right breast was treated with tangential photon fields using 4 plans tangents with a combination of 6 and 10 megavolt photons. A single isocenter was applied at the superior to the field to create an exact isocentric beam junction with the regional nodal radiation beams.  B.  the right supraclavicular fossa was treated using a left anterior oblique 6 megavolt photon fields prescribed to 3 cm depth  C.  the right axilla was supplemented using a posterior axillary beam from the right posterior oblique gantry position delivering 6 megavolt photons to the midaxillary level based on the axillary vasculature 2.  The lumpectomy site was boosted using electron beam with 9 megavolt electron volt energy prescribed to cover the lumpectomy cavity and placed on CT planning.  Narrative: The patient tolerated radiation treatment relatively well.   She experienced radiation dermatitis with erythema hyperpigmentation and she used radiaplex for this.  Plan: The patient has completed radiation treatment. She will return to radiation oncology clinic in one month or sooner if necessary. She understands to contact our office if she  has any questions or concerns. ________________________________  Sheral Apley Tammi Klippel, M.D.

## 2013-09-28 ENCOUNTER — Encounter: Payer: Self-pay | Admitting: Radiation Oncology

## 2013-09-28 NOTE — Progress Notes (Signed)
  Radiation Oncology         [phone] ________________________________  Name: [name] MRN: [id number]  Date: 08/27/2013  DOB: 05-31-1932  VIRTUAL SIMULATION NOTE  NARRATIVE:  The patient underwent simulation today for ongoing radiation therapy.  The existing CT study set was employed for the purpose of virtual treatment planning.  The target and avoidance structures were reviewed and in some cases modified.  Treatment planning then occurred.  The radiation boost prescription was entered and confirmed.  A total of 1 complex treatment device was fabricated in the form of a Cerrobend the electron cut out to shape radiation around the target while maximally excluding nearby normal structures. I have requested : A special port plan as well as a dosimetric calculation.   PLAN:  This modified radiation beam arrangement is intended to continue the current radiation dose to an additional 10 Gy in 5 fractions for a total cumulative dose of 60.4 Gy.  ------------------------------------------------  Sheral Apley. Tammi Klippel, M.D.

## 2013-10-16 ENCOUNTER — Ambulatory Visit
Admission: RE | Admit: 2013-10-16 | Discharge: 2013-10-16 | Disposition: A | Discharge status: Home / Self Care | Payer: Medicare Other | Source: Ambulatory Visit | Attending: Radiation Oncology | Admitting: Radiation Oncology

## 2013-10-16 ENCOUNTER — Encounter: Payer: Self-pay | Admitting: Radiation Oncology

## 2013-10-16 VITALS — BP 129/61 | HR 73 | Temp 97.8°F | Ht 61.5 in | Wt 186.6 lb

## 2013-10-16 DIAGNOSIS — C50219 Malignant neoplasm of upper-inner quadrant of unspecified female breast: Secondary | ICD-10-CM

## 2013-10-16 NOTE — Progress Notes (Signed)
[name] here for follow up after treatment to her right breast.  She denies pain.  She is currently taking aromasin daily.  She denies fatigue.  The skin on her right breast and subclavian is pink and intact.

## 2013-10-16 NOTE — Progress Notes (Signed)
  Radiation Oncology         [phone]) [phone] ________________________________  Name: [name] MRN: [id number]  Date: 10/16/2013  DOB: 08/11/1931  Follow-Up Visit Note  CC: Asencion Noble MD  Pedro Earls, MD  Diagnosis:   80 year old woman with bilateral breast cancers: 1.  Left mastectomy by Dr. Margot Chimes in early 2002 followed by chest wall radiotherapy  2.  T1c (1.5 cm) N1a (1/2 SLN positive) M0 estrogen receptor positive grade I invasive ductal carcinoma of the right breast - Stage IIA s/p breast conservation with radiation 07/21/2013-09/08/2013 where the whole right breast, supraclavicular region, and axilla were treated to 50.4 Gy in 28 fractions and the lumpectomy site was boosted to 60.4 Gy   Interval Since Last Radiation:  4  weeks  Narrative:  The patient returns today for routine follow-up.  She denies pain. She is currently taking aromasin daily. She denies fatigue.                              ALLERGIES:  is allergic to atorvastatin and hydrocodone.  Meds: Current Outpatient Prescriptions  Medication Sig Dispense Refill  . ALPRAZolam (XANAX) 0.25 MG tablet 0.25 mg. TAKE 1/2 TABLET AS NEEDED      . amiodarone (PACERONE) 200 MG tablet Take 1 tablet (200 mg total) by mouth daily.  90 tablet  3  . apixaban (ELIQUIS) 2.5 MG TABS tablet Take 1 tablet (2.5 mg total) by mouth 2 (two) times daily.  60 tablet  11  . exemestane (AROMASIN) 25 MG tablet Take 1 tablet (25 mg total) by mouth daily after breakfast.  90 tablet  3  . furosemide (LASIX) 40 MG tablet Take 40 mg by mouth as needed.      . potassium chloride SA (K-DUR,KLOR-CON) 20 MEQ tablet Take 1 tablet (20 mEq total) by mouth daily.  30 tablet  3  . pravastatin (PRAVACHOL) 20 MG tablet TAKE 1 TABLET EVERY DAY  30 tablet  10  . ramipril (ALTACE) 2.5 MG tablet Take 2.5 mg by mouth daily.      . ranitidine (ZANTAC) 150 MG tablet Take 150 mg by mouth at bedtime.      . verapamil (VERELAN PM) 180 MG 24 hr capsule TAKE ONE CAPSULE BY  MOUTH ONCE A DAY  90 capsule  2  . meclizine (ANTIVERT) 25 MG tablet Take 25 mg by mouth 3 (three) times daily as needed for dizziness.       Marland Kitchen OVER THE COUNTER MEDICATION Take 2 tablets by mouth daily. Sun Cherrella(sp?)  Herb supplement for skin       No current facility-administered medications for this encounter.    Physical Findings: The patient is in no acute distress. Patient is alert and oriented.  height is 5' 1.5" (1.562 m) and weight is 186 lb 9.6 oz (84.641 kg). Her temperature is 97.8 F (36.6 C). Her blood pressure is 129/61 and her pulse is 73. .The skin on her right breast and subclavian is pink and intact  No significant changes.  Lab Findings: Lab Results  Component Value Date   WBC 6.4 07/11/2013   HGB 14.5 07/11/2013   HCT 45.2 07/11/2013   MCV 80.0 07/11/2013   PLT 294 07/11/2013    Impression:  The patient is recovering from the effects of radiation.    Plan:  Follow-up in 6 months.  _____________________________________  Sheral Apley. Tammi Klippel, M.D.

## 2013-10-18 ENCOUNTER — Emergency Department (HOSPITAL_COMMUNITY)
Admission: EM | Admit: 2013-10-18 | Discharge: 2013-10-19 | Disposition: A | Discharge status: Home / Self Care | Payer: Medicare Other | Attending: Emergency Medicine | Admitting: Emergency Medicine

## 2013-10-18 ENCOUNTER — Encounter (HOSPITAL_COMMUNITY): Payer: Self-pay | Admitting: Emergency Medicine

## 2013-10-18 ENCOUNTER — Emergency Department (HOSPITAL_COMMUNITY): Payer: Medicare Other

## 2013-10-18 DIAGNOSIS — S0003XA Contusion of scalp, initial encounter: Secondary | ICD-10-CM | POA: Insufficient documentation

## 2013-10-18 DIAGNOSIS — F411 Generalized anxiety disorder: Secondary | ICD-10-CM | POA: Insufficient documentation

## 2013-10-18 DIAGNOSIS — W1809XA Striking against other object with subsequent fall, initial encounter: Secondary | ICD-10-CM | POA: Insufficient documentation

## 2013-10-18 DIAGNOSIS — H05231 Hemorrhage of right orbit: Secondary | ICD-10-CM

## 2013-10-18 DIAGNOSIS — Z23 Encounter for immunization: Secondary | ICD-10-CM | POA: Insufficient documentation

## 2013-10-18 DIAGNOSIS — Z853 Personal history of malignant neoplasm of breast: Secondary | ICD-10-CM | POA: Insufficient documentation

## 2013-10-18 DIAGNOSIS — Z7901 Long term (current) use of anticoagulants: Secondary | ICD-10-CM | POA: Insufficient documentation

## 2013-10-18 DIAGNOSIS — E785 Hyperlipidemia, unspecified: Secondary | ICD-10-CM | POA: Insufficient documentation

## 2013-10-18 DIAGNOSIS — S1093XA Contusion of unspecified part of neck, initial encounter: Secondary | ICD-10-CM

## 2013-10-18 DIAGNOSIS — Z8612 Personal history of poliomyelitis: Secondary | ICD-10-CM | POA: Insufficient documentation

## 2013-10-18 DIAGNOSIS — Z9889 Other specified postprocedural states: Secondary | ICD-10-CM | POA: Insufficient documentation

## 2013-10-18 DIAGNOSIS — Y9301 Activity, walking, marching and hiking: Secondary | ICD-10-CM | POA: Insufficient documentation

## 2013-10-18 DIAGNOSIS — Y9289 Other specified places as the place of occurrence of the external cause: Secondary | ICD-10-CM | POA: Insufficient documentation

## 2013-10-18 DIAGNOSIS — W010XXA Fall on same level from slipping, tripping and stumbling without subsequent striking against object, initial encounter: Secondary | ICD-10-CM | POA: Insufficient documentation

## 2013-10-18 DIAGNOSIS — K219 Gastro-esophageal reflux disease without esophagitis: Secondary | ICD-10-CM | POA: Insufficient documentation

## 2013-10-18 DIAGNOSIS — Z79899 Other long term (current) drug therapy: Secondary | ICD-10-CM | POA: Insufficient documentation

## 2013-10-18 DIAGNOSIS — I10 Essential (primary) hypertension: Secondary | ICD-10-CM | POA: Insufficient documentation

## 2013-10-18 DIAGNOSIS — S0510XA Contusion of eyeball and orbital tissues, unspecified eye, initial encounter: Secondary | ICD-10-CM | POA: Insufficient documentation

## 2013-10-18 DIAGNOSIS — S0083XA Contusion of other part of head, initial encounter: Secondary | ICD-10-CM

## 2013-10-18 DIAGNOSIS — S0990XA Unspecified injury of head, initial encounter: Secondary | ICD-10-CM

## 2013-10-18 DIAGNOSIS — Z87448 Personal history of other diseases of urinary system: Secondary | ICD-10-CM | POA: Insufficient documentation

## 2013-10-18 DIAGNOSIS — Z8669 Personal history of other diseases of the nervous system and sense organs: Secondary | ICD-10-CM | POA: Insufficient documentation

## 2013-10-18 DIAGNOSIS — I4891 Unspecified atrial fibrillation: Secondary | ICD-10-CM | POA: Insufficient documentation

## 2013-10-18 DIAGNOSIS — Z8619 Personal history of other infectious and parasitic diseases: Secondary | ICD-10-CM | POA: Insufficient documentation

## 2013-10-18 NOTE — ED Notes (Signed)
Pt reports tripping and falling this afternoon, striking right eye.  Reports bruising and swelling in right eye.

## 2013-10-19 ENCOUNTER — Emergency Department (HOSPITAL_COMMUNITY): Payer: Medicare Other

## 2013-10-19 MED ORDER — TETANUS-DIPHTH-ACELL PERTUSSIS 5-2.5-18.5 LF-MCG/0.5 IM SUSP
0.5000 mL | Freq: Once | INTRAMUSCULAR | Status: AC
  Administered 2013-10-19: 0.5 mL via INTRAMUSCULAR
  Filled 2013-10-19: qty 0.5

## 2013-10-19 NOTE — Discharge Instructions (Signed)
Head Injury, Adult  You have received a head injury. It does not appear serious at this time. Headaches and vomiting are common following head injury. It should be easy to awaken from sleeping. Sometimes it is necessary for you to stay in the emergency department for a while for observation. Sometimes admission to the hospital may be needed. After injuries such as yours, most problems occur within the first 24 hours, but side effects may occur up to 7 10 days after the injury. It is important for you to carefully monitor your condition and contact your health care provider or seek immediate medical care if there is a change in your condition.  WHAT ARE THE TYPES OF HEAD INJURIES?  Head injuries can be as minor as a bump. Some head injuries can be more severe. More severe head injuries include:  · A jarring injury to the brain (concussion).  · A bruise of the brain (contusion). This mean there is bleeding in the brain that can cause swelling.  · A cracked skull (skull fracture).  · Bleeding in the brain that collects, clots, and forms a bump (hematoma).  WHAT CAUSES A HEAD INJURY?  A serious head injury is most likely to happen to someone who is in a car wreck and is not wearing a seat belt. Other causes of major head injuries include bicycle or motorcycle accidents, sports injuries, and falls.  HOW ARE HEAD INJURIES DIAGNOSED?  A complete history of the event leading to the injury and your current symptoms will be helpful in diagnosing head injuries. Many times, pictures of the brain, such as CT or MRI are needed to see the extent of the injury. Often, an overnight hospital stay is necessary for observation.   WHEN SHOULD I SEEK IMMEDIATE MEDICAL CARE?   You should get help right away if:  · You have confusion or drowsiness.  · You feel sick to your stomach (nauseous) or have continued, forceful vomiting.  · You have dizziness or unsteadiness that is getting worse.  · You have severe, continued headaches not  relieved by medicine. Only take over-the-counter or prescription medicines for pain, fever, or discomfort as directed by your health care provider.  · You do not have normal function of the arms or legs or are unable to walk.  · You notice changes in the black spots in the center of the colored part of your eye (pupil).  · You have a clear or bloody fluid coming from your nose or ears.  · You have a loss of vision.  During the next 24 hours after the injury, you must stay with someone who can watch you for the warning signs. This person should contact local emergency services (911 in the [place] if you have seizures, you become unconscious, or you are unable to wake up.  HOW CAN I PREVENT A HEAD INJURY IN THE FUTURE?  The most important factor for preventing major head injuries is avoiding motor vehicle accidents.  To minimize the potential for damage to your head, it is crucial to wear seat belts while riding in motor vehicles. Wearing helmets while bike riding and playing collision sports (like football) is also helpful. Also, avoiding dangerous activities around the house will further help reduce your risk of head injury.   WHEN CAN I RETURN TO NORMAL ACTIVITIES AND ATHLETICS?  You should be reevaluated by your health care provider before returning to these activities. If you have any of the following symptoms, you should not return   to activities or contact sports until 1 week after the symptoms have stopped:  · Persistent headache.  · Dizziness or vertigo.  · Poor attention and concentration.  · Confusion.  · Memory problems.  · Nausea or vomiting.  · Fatigue or tire easily.  · Irritability.  · Intolerant of bright lights or loud noises.  · Anxiety or depression.  · Disturbed sleep.  MAKE SURE YOU:   · Understand these instructions.  · Will watch your condition.  · Will get help right away if you are not doing well or get worse.  Document Released: 06/26/2005 Document Revised: 04/16/2013 Document Reviewed:  03/03/2013  [employer]® Patient Information ©2014 [employer].

## 2013-10-19 NOTE — ED Provider Notes (Signed)
80 year old female tripped and fell and injured her forehead on the right side. She is anticoagulated. There is no loss of consciousness. On exam, she has a forehead hematoma and right periorbital ecchymosis but is neurologically intact. CT showed no evidence of intracranial injury. She will be discharged with instructions to watch for signs of delayed bleeding and return should any neurologic symptoms occur.  Medical screening examination/treatment/procedure(s) were conducted as a shared visit with non-physician practitioner(s) and myself.  I personally evaluated the patient during the encounter.   Delora Fuel, MD 12/45/80 [id number]

## 2013-10-24 NOTE — ED Provider Notes (Signed)
CSN: [id number]     Arrival date & time 10/18/13  2002 History   First MD Initiated Contact with Patient 10/18/13 2314     Chief Complaint  Patient presents with  . Eye Injury     (Consider location/radiation/quality/duration/timing/severity/associated sxs/prior Treatment) The history is provided by the patient.   [name] is a 80 y.o. female presenting for evaluation of head and periorbital injury occurring approximately 7 hours prior to arrival when she tripped walking in a parking lot, hitting her right forehead on the curb.  She denies any loc and denies other injury beside her head and face, specifically no neck, chest, back or extremity pain.  She is on Eliquis for history of afib.  She denies any localized weakness, dizziness, nausea, vomiting, excessive drowsiness or confusion since the event and denies loc.  She has applied ice to her injury but continues to have swelling and bruising which is now more intensely around the right eye.  She denies visual changes or eye pain, describes localized pain and mild headache.     Past Medical History  Diagnosis Date  . Herpes zoster   . Arteriosclerotic cardiovascular disease (ASCVD)      cath Feb '07- no obstructive dz - 20% proximal LAD only; EF 65%; possible coronary artery spasm  . Hyperlipidemia     Lipid profile in 11/2008:282, 176, 64, 201  . Polio 1946    at age 46  . Peripheral neuropathy   . Anxiety   . Hypertension     diastolic dysfunction; normal CMet and TSH in 11/2009; normal CBC in 04/2010  . GERD (gastroesophageal reflux disease)     hiatal hernia  . Rectal bleed   . Chest pain     Long-standing and atypical  . Atrial fibrillation     Paroxysmal on event recorder in 2009; regular supraventricular tachycardia at a rate of 150 also identified on that study representing either atrial flutter or PSVT; onset of persistent AF in 03/2011  . Chronic anticoagulation 04/12/2011  . Renal insufficiency   . Carcinoma of  breast 2001    Left mastectomy with positive nodes  . Malignant neoplasm of breast (female), unspecified site 05/27/2013    right breast lumpectomy with positive node   Past Surgical History  Procedure Laterality Date  . Cholecystectomy  1999  . Colonoscopy  02/22/2011    Procedure: COLONOSCOPY;  Surgeon: Rogene Houston, MD;  [facility];  Service: Endoscopy;  Laterality: N/A;; performed for scant hematochezia  . Esophagogastroduodenoscopy  02/22/2011    Procedure: ESOPHAGOGASTRODUODENOSCOPY (EGD);  Surgeon: Rogene Houston, MD;  [facility];  Service: Endoscopy;  Laterality: N/A;  . Mastectomy  2001    Left;modified radical with lymph node dissection  . Port-a-cath removal      pac in and out 2002  . Dilation and curettage of uterus    . Eye surgery      both cataracts  . Breast lumpectomy with needle localization and axillary sentinel lymph node bx Right 05/27/2013    Procedure: BREAST LUMPECTOMY WITH NEEDLE LOCALIZATION AND AXILLARY SENTINEL LYMPH NODE BX;  Surgeon: Pedro Earls, MD;  Location: [place];  Service: General;  Laterality: Right;  . Breast surgery     Family History  Problem Relation Age of Onset  . Cancer Sister     lung  . Cancer Brother     lipoma  . Cancer Brother     kidney  History  Substance Use Topics  . Smoking status: Never Smoker   . Smokeless tobacco: Never Used  . Alcohol Use: No   OB History   Grav Para Term Preterm Abortions TAB SAB Ect Mult Living                 Review of Systems  Constitutional: Negative for fever.  HENT: Negative for congestion, ear discharge and sore throat.   Eyes: Negative.  Negative for pain and visual disturbance.  Respiratory: Negative for shortness of breath.   Cardiovascular: Negative for chest pain.  Gastrointestinal: Negative for nausea and abdominal pain.  Genitourinary: Negative.   Musculoskeletal: Negative for arthralgias, joint swelling and neck pain.   Skin: Positive for color change and wound. Negative for rash.  Neurological: Negative for dizziness, weakness, light-headedness, numbness and headaches.  Psychiatric/Behavioral: Negative.       Allergies  Atorvastatin and Hydrocodone  Home Medications   Prior to Admission medications   Medication Sig Start Date End Date Taking? Authorizing Provider  ALPRAZolam (XANAX) 0.25 MG tablet 0.25 mg. TAKE 1/2 TABLET AS NEEDED    Historical Provider, MD  amiodarone (PACERONE) 200 MG tablet Take 1 tablet (200 mg total) by mouth daily. 09/15/13   Evans Lance, MD  apixaban (ELIQUIS) 2.5 MG TABS tablet Take 1 tablet (2.5 mg total) by mouth 2 (two) times daily. 08/07/13   Evans Lance, MD  exemestane (AROMASIN) 25 MG tablet Take 1 tablet (25 mg total) by mouth daily after breakfast. 06/26/13   Annia Belt, MD  furosemide (LASIX) 40 MG tablet Take 40 mg by mouth as needed.    Historical Provider, MD  meclizine (ANTIVERT) 25 MG tablet Take 25 mg by mouth 3 (three) times daily as needed for dizziness.     Historical Provider, MD  OVER THE COUNTER MEDICATION Take 2 tablets by mouth daily. Sun Cherrella(sp?)  Herb supplement for skin    Historical Provider, MD  potassium chloride SA (K-DUR,KLOR-CON) 20 MEQ tablet Take 1 tablet (20 mEq total) by mouth daily. 02/07/13   Lendon Colonel, NP  pravastatin (PRAVACHOL) 20 MG tablet TAKE 1 TABLET EVERY DAY 09/08/13   Evans Lance, MD  ramipril (ALTACE) 2.5 MG tablet Take 2.5 mg by mouth daily.    Historical Provider, MD  ranitidine (ZANTAC) 150 MG tablet Take 150 mg by mouth at bedtime.    Historical Provider, MD  verapamil (VERELAN PM) 180 MG 24 hr capsule TAKE ONE CAPSULE BY MOUTH ONCE A DAY 05/28/13   Arnoldo Lenis, MD   BP 140/86  Pulse 71  Temp(Src) 98.1 F (36.7 C) (Oral)  Resp 18  Ht 5\' 1"  (1.549 m)  Wt 180 lb (81.647 kg)  BMI 34.03 kg/m2  SpO2 98% Physical Exam  Nursing note and vitals reviewed. Constitutional: She appears  well-developed and well-nourished.  HENT:  Head: Normocephalic. Head is with contusion. Head is without Battle's sign and without laceration.  Right Ear: External ear normal. No hemotympanum.  Left Ear: External ear normal. No hemotympanum.  Nose: Nose normal.  Mouth/Throat: Oropharynx is clear and moist.  Right periorbital and eyebrow contusion and edema.  No loss of skin integrity.  Eyes: Conjunctivae and EOM are normal. Pupils are equal, round, and reactive to light.  Neck: Normal range of motion. Neck supple. No spinous process tenderness and no muscular tenderness present.  Cardiovascular: Normal rate, regular rhythm, normal heart sounds and intact distal pulses.   Pulmonary/Chest: Effort normal and breath  sounds normal. She has no wheezes.  Abdominal: Soft. Bowel sounds are normal. There is no tenderness.  Musculoskeletal: Normal range of motion.  Neurological: She is alert. She has normal strength. No cranial nerve deficit or sensory deficit. Gait normal. GCS eye subscore is 4. GCS verbal subscore is 5. GCS motor subscore is 6.  Equal grip strength.  Skin: Skin is warm and dry. Abrasion noted.  Superficial abrasion right forehead.  Psychiatric: She has a normal mood and affect.    ED Course  Procedures (including critical care time) Labs Review Labs Reviewed - No data to display  Imaging Review No results found.   EKG Interpretation None      MDM   Final diagnoses:  Minor head injury without loss of consciousness  Periorbital hematoma of right eye    Pt was also seen by Dr. Roxanne Mins during this visit.  Pt with head injury and periorbital hematoma without evidence for intracranial injury.  CT's were reviewed and negative.  Pt encouraged continued ice tx/cool compresses for swelling, pain.  She was given a tetanus update given abrasion.  Pt with 7+ hour old injury,  No need for further ed observation.  She is here with her grandson who will escort her home.    The patient  appears reasonably screened and/or stabilized for discharge and I doubt any other medical condition or other [facility] requiring further screening, evaluation, or treatment in the ED at this time prior to discharge.     Evalee Jefferson, PA-C 10/24/13 1410

## 2013-10-30 ENCOUNTER — Telehealth: Payer: Self-pay | Admitting: Internal Medicine

## 2013-10-30 DIAGNOSIS — I2510 Atherosclerotic heart disease of native coronary artery without angina pectoris: Secondary | ICD-10-CM

## 2013-10-30 DIAGNOSIS — F411 Generalized anxiety disorder: Secondary | ICD-10-CM

## 2013-10-30 DIAGNOSIS — I4891 Unspecified atrial fibrillation: Secondary | ICD-10-CM

## 2013-10-30 DIAGNOSIS — G609 Hereditary and idiopathic neuropathy, unspecified: Secondary | ICD-10-CM

## 2013-10-30 DIAGNOSIS — I10 Essential (primary) hypertension: Secondary | ICD-10-CM

## 2013-10-30 NOTE — Telephone Encounter (Signed)
Patient would like all RX's to be mail order now so she needs them written out. Please call patient after 2 pm tomorrow with any questions. [name]

## 2013-10-31 ENCOUNTER — Other Ambulatory Visit: Payer: Self-pay

## 2013-10-31 MED ORDER — APIXABAN 2.5 MG PO TABS: 2.5000 mg | ORAL_TABLET | Freq: Two times a day (BID) | ORAL | Status: DC

## 2013-10-31 MED ORDER — AMIODARONE HCL 200 MG PO TABS: 200.0000 mg | ORAL_TABLET | Freq: Every day | ORAL | Status: DC

## 2013-10-31 MED ORDER — PRAVASTATIN SODIUM 20 MG PO TABS: 20.0000 mg | ORAL_TABLET | Freq: Every day | ORAL | Status: DC

## 2013-10-31 MED ORDER — VERAPAMIL HCL ER 180 MG PO CP24: 180.0000 mg | ORAL_CAPSULE | Freq: Every day | ORAL | Status: DC

## 2013-10-31 MED ORDER — POTASSIUM CHLORIDE CRYS ER 20 MEQ PO TBCR: 20.0000 meq | EXTENDED_RELEASE_TABLET | Freq: Every day | ORAL | Status: DC

## 2013-10-31 MED ORDER — FUROSEMIDE 40 MG PO TABS: 40.0000 mg | ORAL_TABLET | ORAL | Status: DC | PRN

## 2013-10-31 MED ORDER — RAMIPRIL 2.5 MG PO CAPS: 2.5000 mg | ORAL_CAPSULE | Freq: Every day | ORAL | Status: DC

## 2013-10-31 NOTE — Telephone Encounter (Signed)
rx changed to mail order

## 2013-11-05 ENCOUNTER — Telehealth: Payer: Self-pay | Admitting: Internal Medicine

## 2013-11-05 NOTE — Telephone Encounter (Signed)
New message     Pt is on eliquis.  They need the diagnosis, date she started taking this medication, and other medication pt has tried.

## 2013-11-06 ENCOUNTER — Other Ambulatory Visit: Payer: Self-pay | Admitting: *Deleted

## 2013-11-06 ENCOUNTER — Telehealth: Payer: Self-pay | Admitting: *Deleted

## 2013-11-06 NOTE — Telephone Encounter (Signed)
Message copied by Truett Mainland on Thu Nov 06, 2013  3:41 PM ------      Message from: Priscille Loveless      Created: Thu Nov 06, 2013 11:05 AM      Regarding: FWArne Cleveland                   ----- Message -----         From: Fernande Boyden, RN         Sent: 11/06/2013   8:52 AM           To: Priscille Loveless      Subject: Jeani Hawking, regarding your note forwarded to me, this is a [place] patient and one of your nurses will need to do a prior authorization, I did find this information            Dx: atrial fib      #2. Chronic anticoagulation secondary to atrial fibrillation      She has recently changed from Pradaxa to [place] in view of her age and renal function.      Annia Belt, MD      2/13/20151:00 PM ------

## 2013-11-06 NOTE — Telephone Encounter (Signed)
I got a message from [place] to do this.

## 2013-11-06 NOTE — Telephone Encounter (Signed)
Received call from [facility], they wanted to do prior authorization over the phone, information provided, rep states will go before clinical board for approval or denial.  (patient has tried coumadin and pradaxa in past)

## 2013-11-06 NOTE — Telephone Encounter (Signed)
Called [place] [employer] for prior authorization, they stated that she did not need one. That she could go ahead and pick up medication from pharmacy.

## 2013-11-06 NOTE — Telephone Encounter (Signed)
Also called [employer] and they did not need prior authorization either. I am not sure what you are asking me to do for this pt. Please advise

## 2013-11-07 NOTE — Telephone Encounter (Signed)
From [place], they have approved tier reduction as requested for her eliquis, it has been approved as a tier 3 level.

## 2013-11-15 ENCOUNTER — Encounter (HOSPITAL_COMMUNITY): Payer: Self-pay | Admitting: Emergency Medicine

## 2013-11-15 ENCOUNTER — Emergency Department (HOSPITAL_COMMUNITY): Payer: Medicare Other

## 2013-11-15 ENCOUNTER — Emergency Department (HOSPITAL_COMMUNITY)
Admission: EM | Admit: 2013-11-15 | Discharge: 2013-11-15 | Disposition: A | Discharge status: Home / Self Care | Payer: Medicare Other | Attending: Emergency Medicine | Admitting: Emergency Medicine

## 2013-11-15 DIAGNOSIS — L089 Local infection of the skin and subcutaneous tissue, unspecified: Secondary | ICD-10-CM

## 2013-11-15 DIAGNOSIS — T148XXA Other injury of unspecified body region, initial encounter: Secondary | ICD-10-CM

## 2013-11-15 DIAGNOSIS — Z8669 Personal history of other diseases of the nervous system and sense organs: Secondary | ICD-10-CM | POA: Insufficient documentation

## 2013-11-15 DIAGNOSIS — F411 Generalized anxiety disorder: Secondary | ICD-10-CM | POA: Insufficient documentation

## 2013-11-15 DIAGNOSIS — I4891 Unspecified atrial fibrillation: Secondary | ICD-10-CM | POA: Insufficient documentation

## 2013-11-15 DIAGNOSIS — I10 Essential (primary) hypertension: Secondary | ICD-10-CM | POA: Insufficient documentation

## 2013-11-15 DIAGNOSIS — Z87448 Personal history of other diseases of urinary system: Secondary | ICD-10-CM | POA: Insufficient documentation

## 2013-11-15 DIAGNOSIS — Z8639 Personal history of other endocrine, nutritional and metabolic disease: Secondary | ICD-10-CM | POA: Insufficient documentation

## 2013-11-15 DIAGNOSIS — Z853 Personal history of malignant neoplasm of breast: Secondary | ICD-10-CM | POA: Insufficient documentation

## 2013-11-15 DIAGNOSIS — S91009A Unspecified open wound, unspecified ankle, initial encounter: Principal | ICD-10-CM

## 2013-11-15 DIAGNOSIS — S81009A Unspecified open wound, unspecified knee, initial encounter: Secondary | ICD-10-CM | POA: Insufficient documentation

## 2013-11-15 DIAGNOSIS — S81809A Unspecified open wound, unspecified lower leg, initial encounter: Principal | ICD-10-CM

## 2013-11-15 DIAGNOSIS — Z862 Personal history of diseases of the blood and blood-forming organs and certain disorders involving the immune mechanism: Secondary | ICD-10-CM | POA: Insufficient documentation

## 2013-11-15 DIAGNOSIS — R296 Repeated falls: Secondary | ICD-10-CM | POA: Insufficient documentation

## 2013-11-15 DIAGNOSIS — Z79899 Other long term (current) drug therapy: Secondary | ICD-10-CM | POA: Insufficient documentation

## 2013-11-15 DIAGNOSIS — Y929 Unspecified place or not applicable: Secondary | ICD-10-CM | POA: Insufficient documentation

## 2013-11-15 DIAGNOSIS — Y939 Activity, unspecified: Secondary | ICD-10-CM | POA: Insufficient documentation

## 2013-11-15 DIAGNOSIS — Z7901 Long term (current) use of anticoagulants: Secondary | ICD-10-CM | POA: Insufficient documentation

## 2013-11-15 DIAGNOSIS — Z8619 Personal history of other infectious and parasitic diseases: Secondary | ICD-10-CM | POA: Insufficient documentation

## 2013-11-15 DIAGNOSIS — K219 Gastro-esophageal reflux disease without esophagitis: Secondary | ICD-10-CM | POA: Insufficient documentation

## 2013-11-15 DIAGNOSIS — Z8742 Personal history of other diseases of the female genital tract: Secondary | ICD-10-CM | POA: Insufficient documentation

## 2013-11-15 MED ORDER — CLINDAMYCIN HCL 150 MG PO CAPS
300.0000 mg | ORAL_CAPSULE | Freq: Once | ORAL | Status: AC
  Administered 2013-11-15: 300 mg via ORAL
  Filled 2013-11-15: qty 2

## 2013-11-15 MED ORDER — CLINDAMYCIN HCL 300 MG PO CAPS: 300.0000 mg | ORAL_CAPSULE | Freq: Four times a day (QID) | ORAL | Status: DC

## 2013-11-15 NOTE — ED Provider Notes (Signed)
CSN: [id number]     Arrival date & time 11/15/13  0935 History  This chart was scribed for Richarda Blade, MD by Einar Pheasant, ED Scribe. This patient was seen in room APA05/[facility]05 and the patient's care was started at 9:55 AM.    Chief Complaint  Patient presents with  . Wound Check   The history is provided by the patient. No language interpreter was used.   HPI Comments: [name] is a 80 y.o. female who presents to the Emergency Department requesting a wound check for an abrasion to her right lower leg that occurred October 18, 2013. Pt states that the abrasion is a result of a fall that occurred approximately 3 weeks ago. She states that she is concerned that the wound is not healing properly. Pt reports associated surrounding redness. She is also complaining of associated soreness. Pt is able to walk normally. Denies any fever, chills, nausea, emesis, or SOB.  Pt is currently on Eliquis.   Past Medical History  Diagnosis Date  . Herpes zoster   . Arteriosclerotic cardiovascular disease (ASCVD)      cath Feb '07- no obstructive dz - 20% proximal LAD only; EF 65%; possible coronary artery spasm  . Hyperlipidemia     Lipid profile in 11/2008:282, 176, 64, 201  . Polio 1946    at age 5  . Peripheral neuropathy   . Anxiety   . Hypertension     diastolic dysfunction; normal CMet and TSH in 11/2009; normal CBC in 04/2010  . GERD (gastroesophageal reflux disease)     hiatal hernia  . Rectal bleed   . Chest pain     Long-standing and atypical  . Atrial fibrillation     Paroxysmal on event recorder in 2009; regular supraventricular tachycardia at a rate of 150 also identified on that study representing either atrial flutter or PSVT; onset of persistent AF in 03/2011  . Chronic anticoagulation 04/12/2011  . Renal insufficiency   . Carcinoma of breast 2001    Left mastectomy with positive nodes  . Malignant neoplasm of breast (female), unspecified site 05/27/2013    right breast  lumpectomy with positive node   Past Surgical History  Procedure Laterality Date  . Cholecystectomy  1999  . Colonoscopy  02/22/2011    Procedure: COLONOSCOPY;  Surgeon: Rogene Houston, MD;  [facility];  Service: Endoscopy;  Laterality: N/A;; performed for scant hematochezia  . Esophagogastroduodenoscopy  02/22/2011    Procedure: ESOPHAGOGASTRODUODENOSCOPY (EGD);  Surgeon: Rogene Houston, MD;  [facility];  Service: Endoscopy;  Laterality: N/A;  . Mastectomy  2001    Left;modified radical with lymph node dissection  . Port-a-cath removal      pac in and out 2002  . Dilation and curettage of uterus    . Eye surgery      both cataracts  . Breast lumpectomy with needle localization and axillary sentinel lymph node bx Right 05/27/2013    Procedure: BREAST LUMPECTOMY WITH NEEDLE LOCALIZATION AND AXILLARY SENTINEL LYMPH NODE BX;  Surgeon: Pedro Earls, MD;  Location: [place];  Service: General;  Laterality: Right;  . Breast surgery     Family History  Problem Relation Age of Onset  . Cancer Sister     lung  . Cancer Brother     lipoma  . Cancer Brother     kidney   History  Substance Use Topics  . Smoking status: Never Smoker   .  Smokeless tobacco: Never Used  . Alcohol Use: No   OB History   Grav Para Term Preterm Abortions TAB SAB Ect Mult Living                 Review of Systems  Skin: Positive for wound (abrasion to left lower leg).  All other systems reviewed and are negative.     Allergies  Atorvastatin and Hydrocodone  Home Medications   Prior to Admission medications   Medication Sig Start Date End Date Taking? Authorizing Provider  ALPRAZolam (XANAX) 0.25 MG tablet 0.25 mg. TAKE 1/2 TABLET AS NEEDED    Historical Provider, MD  amiodarone (PACERONE) 200 MG tablet Take 1 tablet (200 mg total) by mouth daily. 10/31/13   Evans Lance, MD  apixaban (ELIQUIS) 2.5 MG TABS tablet Take 1 tablet (2.5 mg total) by mouth 2  (two) times daily. 10/31/13   Evans Lance, MD  exemestane (AROMASIN) 25 MG tablet Take 1 tablet (25 mg total) by mouth daily after breakfast. 06/26/13   Annia Belt, MD  furosemide (LASIX) 40 MG tablet Take 1 tablet (40 mg total) by mouth as needed. 10/31/13   Evans Lance, MD  meclizine (ANTIVERT) 25 MG tablet Take 25 mg by mouth 3 (three) times daily as needed for dizziness.     Historical Provider, MD  OVER THE COUNTER MEDICATION Take 2 tablets by mouth daily. Sun Cherrella(sp?)  Herb supplement for skin    Historical Provider, MD  potassium chloride SA (K-DUR,KLOR-CON) 20 MEQ tablet Take 1 tablet (20 mEq total) by mouth daily. 10/31/13   Evans Lance, MD  pravastatin (PRAVACHOL) 20 MG tablet Take 1 tablet (20 mg total) by mouth daily. 10/31/13   Evans Lance, MD  ramipril (ALTACE) 2.5 MG capsule Take 1 capsule (2.5 mg total) by mouth daily. 10/31/13   Evans Lance, MD  ranitidine (ZANTAC) 150 MG tablet Take 150 mg by mouth at bedtime.    Historical Provider, MD  verapamil (VERELAN PM) 180 MG 24 hr capsule Take 1 capsule (180 mg total) by mouth at bedtime. 10/31/13   Evans Lance, MD   BP 137/85  Pulse 73  Temp(Src) 98.3 F (36.8 C) (Oral)  Resp 16  Ht 5\' 1"  (1.549 m)  Wt 180 lb (81.647 kg)  BMI 34.03 kg/m2  SpO2 97%  Physical Exam  Nursing note and vitals reviewed. Constitutional: She is oriented to person, place, and time. She appears well-developed and well-nourished.  HENT:  Head: Normocephalic and atraumatic.  Eyes: Conjunctivae and EOM are normal. Pupils are equal, round, and reactive to light.  Neck: Normal range of motion and phonation normal. Neck supple.  Cardiovascular: Normal rate, regular rhythm and intact distal pulses.   Pulmonary/Chest: Effort normal and breath sounds normal.  Abdominal: Soft.  Musculoskeletal: Normal range of motion.  Neurological: She is alert and oriented to person, place, and time. She exhibits normal muscle tone.  Skin: Skin is  warm and dry.  Right anterior shin: 5 cm indurated but not fluctuant. Overlying open area with bloody drainage.   Psychiatric: She has a normal mood and affect. Her behavior is normal. Judgment and thought content normal.    ED Course  Procedures (including critical care time)  DIAGNOSTIC STUDIES: Oxygen Saturation is 97% on RA, adequate by my interpretation.    COORDINATION OF CARE:  Patient Vitals for the past 24 hrs:  BP Temp Temp src Pulse Resp SpO2 Height Weight  11/15/13 1116  123/85 mmHg 98.1 F (36.7 C) Oral 60 18 96 % - -  11/15/13 0945 137/85 mmHg 98.3 F (36.8 C) Oral 73 16 97 % 5\' 1"  (1.549 m) 180 lb (81.647 kg)   9:57 AM- Advised pt do apply warm compresses to the area.  Pt advised of plan for treatment and pt agrees. 11:11 AM- pt reassessed and advised to follow up with her PCP. She was prescribed antibiotics upon discharge.  Imaging Review Dg Tibia/fibula Right  11/15/2013   CLINICAL DATA:  Fall 1 month ago, scrape on anterior right lower leg not healing  EXAM: RIGHT TIBIA AND FIBULA - 2 VIEW  COMPARISON:  None.  FINDINGS: There is no evidence of fracture or other focal bone lesions. No focal soft tissue irregularity. Degenerative arthritis noted in incompletely imaged knee joint.  IMPRESSION: Negative.   Electronically Signed   By: Jacqulynn Cadet M.D.   On: 11/15/2013 10:52     EKG Interpretation None      MDM   Final diagnoses:  Wound infection    Evaluation is consistent with wound infection, without deep tissue infection or osteomyelitis. Hemodynamically stable, doubt metabolic instability   Nursing Notes Reviewed/ Care Coordinated Applicable Imaging Reviewed Interpretation of Laboratory Data incorporated into ED treatment  The patient appears reasonably screened and/or stabilized for discharge and I doubt any other medical condition or other [facility] requiring further screening, evaluation, or treatment in the ED at this time prior to discharge.  Plan:  Home Medications- [place]; Home Treatments- rest, heat treatments; return here if the recommended treatment, does not improve the symptoms; Recommended follow up- PCP in 3 days  I personally performed the services described in this documentation, which was scribed in my presence. The recorded information has been reviewed and is accurate.     Richarda Blade, MD 11/15/13 [phone]

## 2013-11-15 NOTE — ED Notes (Addendum)
Pt states that she fell on April 11 and "scrapped" her right lower leg, advises that she has been using otc antibiotic ointment with no improvement and that the area started having redness around it about a week ago, cms intact distal, is concerned that she may need an antibiotic

## 2013-11-15 NOTE — Discharge Instructions (Signed)
°  Continue to elevate your right leg, above your heart, and use a warm, moist compress on the sore area 3 or 4 times a day for 60 minutes. Followup, with your doctor in 3 days for checkup.     Wound Infection A wound infection happens when a type of germ (bacteria) starts growing in the wound. In some cases, this can cause the wound to break open. If cared for properly, the infected wound will heal from the inside to the outside. Wound infections need treatment. CAUSES An infection is caused by bacteria growing in the wound.  SYMPTOMS   Increase in redness, swelling, or pain at the wound site.  Increase in drainage at the wound site.  Wound or bandage (dressing) starts to smell bad.  Fever.  Feeling tired or fatigued.  Pus draining from the wound. TREATMENT  You caregiver will prescribe antibiotic medicine. The wound infection should improve within 24 to 48 hours. Any redness around the wound should stop spreading and the wound should be less painful.  HOME CARE INSTRUCTIONS   Only take over-the-counter or prescription medicines for pain, discomfort, or fever as directed by your caregiver.  Take your antibiotics as directed. Finish them even if you start to feel better.  Gently wash the area with mild soap and water 2 times a day, or as directed. Rinse off the soap. Pat the area dry with a clean towel. Do not rub the wound. This may cause bleeding.  Follow your caregiver's instructions for how often you need to change the dressing.  Apply ointment and a dressing to the wound as directed.  If the dressing sticks, moisten it with soapy water and gently remove it.  Change the bandage right away if it becomes wet, dirty, or develops a bad smell.  Take showers. Do not take tub baths, swim, or do anything that may soak the wound until it is healed.  Avoid exercises that make you sweat heavily.  Use anti-itch medicine as directed by your caregiver. The wound may itch when it is  healing. Do not pick or scratch at the wound.  Follow up with your caregiver to get your wound rechecked as directed. SEEK MEDICAL CARE IF:  You have an increase in swelling, pain, or redness around the wound.  You have an increase in the amount of pus coming from the wound.  There is a bad smell coming from the wound.  More of the wound breaks open.  You have a fever. MAKE SURE YOU:   Understand these instructions.  Will watch your condition.  Will get help right away if you are not doing well or get worse. Document Released: 03/25/2003 Document Revised: 09/18/2011 Document Reviewed: 10/30/2010 [facility] Patient Information 2014 [place], [place].

## 2013-11-15 NOTE — ED Notes (Signed)
Pt was seen here April 11th after a fall and abrasion to right lower leg. Pt states leg has not healed. Redness  Noted around the area. NAD. Pt is able to ambulate without difficulty.

## 2013-12-03 ENCOUNTER — Ambulatory Visit (INDEPENDENT_AMBULATORY_CARE_PROVIDER_SITE_OTHER): Payer: Medicare Other | Admitting: Surgery

## 2013-12-03 ENCOUNTER — Encounter (INDEPENDENT_AMBULATORY_CARE_PROVIDER_SITE_OTHER): Payer: Self-pay | Admitting: Surgery

## 2013-12-03 VITALS — BP 126/80 | HR 67 | Temp 97.0°F | Ht 61.0 in | Wt 190.0 lb

## 2013-12-03 DIAGNOSIS — C50912 Malignant neoplasm of unspecified site of left female breast: Secondary | ICD-10-CM

## 2013-12-03 DIAGNOSIS — C50919 Malignant neoplasm of unspecified site of unspecified female breast: Secondary | ICD-10-CM

## 2013-12-03 DIAGNOSIS — C50911 Malignant neoplasm of unspecified site of right female breast: Secondary | ICD-10-CM

## 2013-12-03 NOTE — Progress Notes (Signed)
[name] 81 y.o.  Body mass index is 35.92 kg/(m^2).  Patient Active Problem List   Diagnosis Date Noted  . Right Breast Cancer 06/25/2013  . Bilateral breast cancer 05/09/2013  . Wrist fracture, closed 12/10/2012  . Triquetral fracture 10/29/2012  . Sprain of right wrist 10/29/2012  . Chronic anticoagulation 04/12/2011  . Atrial fibrillation 03/22/2011  . Arteriosclerotic cardiovascular disease (ASCVD)   . Hyperlipidemia   . Polio   . Hypertension   . GERD (gastroesophageal reflux disease)   . Carcinoma of breast   . HERPES ZOSTER 11/12/2008  . ANXIETY 07/24/2007  . PERIPHERAL NEUROPATHY 07/24/2007    Allergies  Allergen Reactions  . Atorvastatin Other (See Comments)    Myalgias  . Hydrocodone Other (See Comments)    GI distress.    Past Surgical History  Procedure Laterality Date  . Cholecystectomy  1999  . Colonoscopy  02/22/2011    Procedure: COLONOSCOPY;  Surgeon: Rogene Houston, MD;  [facility];  Service: Endoscopy;  Laterality: N/A;; performed for scant hematochezia  . Esophagogastroduodenoscopy  02/22/2011    Procedure: ESOPHAGOGASTRODUODENOSCOPY (EGD);  Surgeon: Rogene Houston, MD;  [facility];  Service: Endoscopy;  Laterality: N/A;  . Mastectomy  2001    Left;modified radical with lymph node dissection  . Port-a-cath removal      pac in and out 2002  . Dilation and curettage of uterus    . Eye surgery      both cataracts  . Breast lumpectomy with needle localization and axillary sentinel lymph node bx Right 05/27/2013    Procedure: BREAST LUMPECTOMY WITH NEEDLE LOCALIZATION AND AXILLARY SENTINEL LYMPH NODE BX;  Surgeon: Pedro Earls, MD;  Location: [place];  Service: General;  Laterality: Right;  . Breast surgery     FAGAN,ROY, MD No diagnosis found.  Followup from lumpectomy and RT on the right-mastectomy on the left.  Right breast without masses.  Biopsy scar more prominent.  Mastectomy sites are flat  and without masses.  To see Dr. Jana Hakim next week.  She asked to be seen prn.   Return prn Matt B. Hassell Done, MD, [facility] [phone] beeper [phone]  12/03/2013 4:06 PM

## 2013-12-03 NOTE — Patient Instructions (Addendum)
Thanks for your patience.  If you need further assistance after leaving the office, please call our office and speak with a [facility] nurse.  [phone].  If you want to leave a message for Dr. Pernell Dikes, please call his office phone at [phone]. 

## 2013-12-09 ENCOUNTER — Other Ambulatory Visit (HOSPITAL_BASED_OUTPATIENT_CLINIC_OR_DEPARTMENT_OTHER): Payer: Medicare Other

## 2013-12-09 ENCOUNTER — Ambulatory Visit (HOSPITAL_BASED_OUTPATIENT_CLINIC_OR_DEPARTMENT_OTHER): Payer: Medicare Other | Admitting: Oncology

## 2013-12-09 ENCOUNTER — Telehealth: Payer: Self-pay | Admitting: Oncology

## 2013-12-09 VITALS — BP 130/78 | HR 71 | Temp 98.4°F | Resp 18 | Ht 61.0 in | Wt 189.1 lb

## 2013-12-09 DIAGNOSIS — Z79811 Long term (current) use of aromatase inhibitors: Secondary | ICD-10-CM

## 2013-12-09 DIAGNOSIS — I2510 Atherosclerotic heart disease of native coronary artery without angina pectoris: Secondary | ICD-10-CM

## 2013-12-09 DIAGNOSIS — Z853 Personal history of malignant neoplasm of breast: Secondary | ICD-10-CM

## 2013-12-09 DIAGNOSIS — C50919 Malignant neoplasm of unspecified site of unspecified female breast: Secondary | ICD-10-CM

## 2013-12-09 DIAGNOSIS — I4891 Unspecified atrial fibrillation: Secondary | ICD-10-CM

## 2013-12-09 DIAGNOSIS — C50912 Malignant neoplasm of unspecified site of left female breast: Secondary | ICD-10-CM

## 2013-12-09 DIAGNOSIS — Z9010 Acquired absence of unspecified breast and nipple: Secondary | ICD-10-CM

## 2013-12-09 DIAGNOSIS — Z170 Estrogen receptor positive status [ER+]: Secondary | ICD-10-CM

## 2013-12-09 DIAGNOSIS — M8580 Other specified disorders of bone density and structure, unspecified site: Secondary | ICD-10-CM

## 2013-12-09 DIAGNOSIS — C50911 Malignant neoplasm of unspecified site of right female breast: Secondary | ICD-10-CM

## 2013-12-09 DIAGNOSIS — C50219 Malignant neoplasm of upper-inner quadrant of unspecified female breast: Secondary | ICD-10-CM

## 2013-12-09 LAB — COMPREHENSIVE METABOLIC PANEL (CC13)
ALT: 30 U/L (ref 0–55)
AST: 30 U/L (ref 5–34)
Albumin: 3.4 g/dL — ABNORMAL LOW (ref 3.5–5.0)
Alkaline Phosphatase: 92 U/L (ref 40–150)
Anion Gap: 12 mEq/L — ABNORMAL HIGH (ref 3–11)
BUN: 20 mg/dL (ref 7.0–26.0)
CO2: 26 meq/L (ref 22–29)
CREATININE: 1 mg/dL (ref 0.6–1.1)
Calcium: 9.2 mg/dL (ref 8.4–10.4)
Chloride: 104 mEq/L (ref 98–109)
GLUCOSE: 90 mg/dL (ref 70–140)
Potassium: 4.6 mEq/L (ref 3.5–5.1)
Sodium: 141 mEq/L (ref 136–145)
Total Bilirubin: 0.5 mg/dL (ref 0.20–1.20)
Total Protein: 6.7 g/dL (ref 6.4–8.3)

## 2013-12-09 LAB — CBC WITH DIFFERENTIAL/PLATELET
BASO%: 1.6 % (ref 0.0–2.0)
Basophils Absolute: 0.1 10*3/uL (ref 0.0–0.1)
EOS%: 1.9 % (ref 0.0–7.0)
Eosinophils Absolute: 0.1 10*3/uL (ref 0.0–0.5)
HEMATOCRIT: 42.5 % (ref 34.8–46.6)
HGB: 13.7 g/dL (ref 11.6–15.9)
LYMPH%: 19.4 % (ref 14.0–49.7)
MCH: 28.2 pg (ref 25.1–34.0)
MCHC: 32.3 g/dL (ref 31.5–36.0)
MCV: 87.5 fL (ref 79.5–101.0)
MONO#: 0.8 10*3/uL (ref 0.1–0.9)
MONO%: 14.4 % — ABNORMAL HIGH (ref 0.0–14.0)
NEUT#: 3.4 10*3/uL (ref 1.5–6.5)
NEUT%: 62.7 % (ref 38.4–76.8)
Platelets: 277 10*3/uL (ref 145–400)
RBC: 4.86 10*6/uL (ref 3.70–5.45)
RDW: 14.3 % (ref 11.2–14.5)
WBC: 5.4 10*3/uL (ref 3.9–10.3)
lymph#: 1.1 10*3/uL (ref 0.9–3.3)

## 2013-12-09 MED ORDER — EXEMESTANE 25 MG PO TABS: 25.0000 mg | ORAL_TABLET | Freq: Every day | ORAL | Status: DC

## 2013-12-09 NOTE — Telephone Encounter (Signed)
perpof to sch pt appt-sch [facility] @ [facility] per pr req-GM okd-pt given copy of sch abd address & location of BD

## 2013-12-09 NOTE — Progress Notes (Signed)
[name]  [phone] [phone]) [phone]     ID: [name] OB: 06/15/1932  MR#: [id number]  RDE#:[id number]  PCP: Asencion Noble, MD GYN:   SU: Johnathan Hausen OTHER MD: Tyler Pita, Cristopher Peru  CHIEF COMPLAINT: TREATMENT:  BREAST CANCER HISTORY: From doctor Fatima Sanger for [facility] 06/26/2013 note:  "Pleasant 80 year old Daniel who I graduated from our practice over 3 years ago in 2011. She was a 10 year survivor of an initial stage II, 6 node positive, ER PR positive, HER-2 positive, cancer of the left breast diagnosed in October 2001 treated by mastectomy followed by 6 cycles of Cytoxan Adriamycin Taxotere chemotherapy than 5 years of Arimidex hormonal therapy. Arimidex completed in June 2011.    A recent routine mammogram done  on 03/25/2013 showed clustered calcifications which were suspicious for malignancy. She underwent a diagnostic mammogram on October 3 which confirmed these findings then she underwent a needle biopsy on October 14. MRI done on October 29 showed a 2 x 1.8 x 1.3 cm lesion at the 12:00 position. She underwent a lumpectomy and sentinel lymph node dissection on 05/27/2013 by Dr. Johnathan Hausen. Final measurements of the tumor were 1.5 cm. Primarily invasive ductal. Positive vascular and lymphatic invasion. Grade 2 of 3. Estrogen and progesterone receptors both 100%. HER-2 negative. Ki-67 55%. One of 2 sentinel lymph nodes grossly positive for invasive cancer.   She has had a number of interim medical problems since she last saw me. She has developed atrial fibrillation. This was poorly controlled medically. She underwent DC cardioversion 2 years ago but unfortunately has reverted to atrial fibrillation again recently and another cardioversion procedure is planned. She was on verapamil long-acting and was recently started on amiodarone. Of note she was also started on one of the new oral anticoagulants, Pradaxa, in full doses of 150 mg twice  daily."  Her subsequent history is as detailed below  INTERVAL HISTORY: This 80 year old [name], previously followed by Dr. Beryle Beams, is establishing herself in my service today. I have done back in her records 2 2001, when she had her initial diagnosis of left breast cancer, and have reviewed the more recent right breast cancer and its adjuvant treatment with her in detail today.  REVIEW OF SYSTEMS: [name] tolerated radiation well. She had some fatigue and some skin changes which have resolved. She is currently on Eliquis (apixaban) for her atrial fibrillation and is tolerating that well. She still has some discomfort in her right breast. She has sinus allergies. She denies unusual headaches, visual changes, nausea, vomiting, dizziness, or gait imbalance. There has been no bleeding, rash or fever. She has no hot flashes and only minimal bilateral dryness issues. A detailed review of systems today was otherwise noncontributory  PAST MEDICAL HISTORY: Past Medical History  Diagnosis Date  . Herpes zoster   . Arteriosclerotic cardiovascular disease (ASCVD)      cath Feb '07- no obstructive dz - 20% proximal LAD only; EF 65%; possible coronary artery spasm  . Hyperlipidemia     Lipid profile in 11/2008:282, 176, 64, 201  . Polio 1946    at age 21  . Peripheral neuropathy   . Anxiety   . Hypertension     diastolic dysfunction; normal CMet and TSH in 11/2009; normal CBC in 04/2010  . GERD (gastroesophageal reflux disease)     hiatal hernia  . Rectal bleed   . Chest pain     Long-standing and atypical  . Atrial fibrillation  Paroxysmal on event recorder in 2009; regular supraventricular tachycardia at a rate of 150 also identified on that study representing either atrial flutter or PSVT; onset of persistent AF in 03/2011  . Chronic anticoagulation 04/12/2011  . Renal insufficiency   . Carcinoma of breast 2001    Left mastectomy with positive nodes  . Malignant neoplasm of  breast (female), unspecified site 05/27/2013    right breast lumpectomy with positive node    PAST SURGICAL HISTORY: Past Surgical History  Procedure Laterality Date  . Cholecystectomy  1999  . Colonoscopy  02/22/2011    Procedure: COLONOSCOPY;  Surgeon: Rogene Houston, MD;  [facility];  Service: Endoscopy;  Laterality: N/A;; performed for scant hematochezia  . Esophagogastroduodenoscopy  02/22/2011    Procedure: ESOPHAGOGASTRODUODENOSCOPY (EGD);  Surgeon: Rogene Houston, MD;  [facility];  Service: Endoscopy;  Laterality: N/A;  . Mastectomy  2001    Left;modified radical with lymph node dissection  . Port-a-cath removal      pac in and out 2002  . Dilation and curettage of uterus    . Eye surgery      both cataracts  . Breast lumpectomy with needle localization and axillary sentinel lymph node bx Right 05/27/2013    Procedure: BREAST LUMPECTOMY WITH NEEDLE LOCALIZATION AND AXILLARY SENTINEL LYMPH NODE BX;  Surgeon: Pedro Earls, MD;  Location: [place];  Service: General;  Laterality: Right;  . Breast surgery      FAMILY HISTORY Family History  Problem Relation Age of Onset  . Cancer Sister     lung  . Cancer Brother     lipoma  . Cancer Brother     kidney   the patient's parents died from noncancer related causes. The patient had 2 sisters and 2 brothers. One sister developed lung cancer and a brother non-Hodgkin's lymphoma. A brother has also had a history of melanoma. There is no history of breast or ovarian cancer in the family  GYNECOLOGIC HISTORY:  Menarche age 7, first live birth age 7. The patient is GX P2. She went through the change of life approximately age 57, and took hormone replacement for "many years".  SOCIAL HISTORY:  The patient's 2 children are [name], who lives in [place] and is retired from [employer], and [name], who also lives in [place], and is in the building trait. The patient  has 5 grandchildren and 4 great-grandchildren. She is a Psychologist, forensic.    ADVANCED DIRECTIVES:    HEALTH MAINTENANCE: History  Substance Use Topics  . Smoking status: Never Smoker   . Smokeless tobacco: Never Used  . Alcohol Use: No     Colonoscopy:  PAP:  Bone density:  Lipid panel:  Allergies  Allergen Reactions  . Atorvastatin Other (See Comments)    Myalgias  . Hydrocodone Other (See Comments)    GI distress.    Current Outpatient Prescriptions  Medication Sig Dispense Refill  . ALPRAZolam (XANAX) 0.25 MG tablet 0.25 mg. TAKE 1/2 TABLET AS NEEDED      . amiodarone (PACERONE) 200 MG tablet Take 1 tablet (200 mg total) by mouth daily.  90 tablet  3  . apixaban (ELIQUIS) 2.5 MG TABS tablet Take 1 tablet (2.5 mg total) by mouth 2 (two) times daily.  180 tablet  3  . exemestane (AROMASIN) 25 MG tablet Take 1 tablet (25 mg total) by mouth daily after breakfast.  90 tablet  3  . furosemide (LASIX) 40  MG tablet Take 1 tablet (40 mg total) by mouth as needed.  90 tablet  3  . OVER THE COUNTER MEDICATION Take 2 tablets by mouth daily. Sun Cherrella(sp?)  Herb supplement for skin      . potassium chloride SA (K-DUR,KLOR-CON) 20 MEQ tablet Take 1 tablet (20 mEq total) by mouth daily.  90 tablet  3  . pravastatin (PRAVACHOL) 20 MG tablet Take 1 tablet (20 mg total) by mouth daily.  90 tablet  3  . ramipril (ALTACE) 2.5 MG capsule Take 1 capsule (2.5 mg total) by mouth daily.  90 capsule  3  . ranitidine (ZANTAC) 150 MG tablet Take 150 mg by mouth at bedtime.      . verapamil (VERELAN PM) 180 MG 24 hr capsule Take 1 capsule (180 mg total) by mouth at bedtime.  90 capsule  3   No current facility-administered medications for this visit.    OBJECTIVE: Older Daniel who appears stated age 22 Vitals:   12/09/13 1051  BP: 130/78  Pulse: 71  Temp: 98.4 F (36.9 C)  Resp: 18     Body mass index is 35.75 kg/(m^2).    ECOG FS:1 - Symptomatic but completely ambulatory  Ocular: Sclerae  unicteric, EOMs intact Ear-nose-throat: Oropharynx clear and moist Lymphatic: No cervical or supraclavicular adenopathy Lungs no rales or rhonchi Heart irregular rate and rhythm, no murmur appreciated Abd soft, nontender, positive bowel sounds MSK no focal spinal tenderness, no upper extremity lymphedema Neuro: non-focal, well-oriented, appropriate affect Breasts: The right breast is status post lumpectomy and radiation. There is minimal hyperpigmentation. There are no findings suggestive of local recurrence. The right axilla is benign. The left breast status post mastectomy. There is no evidence of local recurrence. Left axilla is benign.   LAB RESULTS:  CMP     Component Value Date/Time   NA 141 12/09/2013 1038   NA 139 07/11/2013 0939   K 4.6 12/09/2013 1038   K 4.9 07/11/2013 0939   CL 102 07/11/2013 0939   CO2 26 12/09/2013 1038   CO2 27 07/11/2013 0939   GLUCOSE 90 12/09/2013 1038   GLUCOSE 98 07/11/2013 0939   BUN 20.0 12/09/2013 1038   BUN 15 07/11/2013 0939   CREATININE 1.0 12/09/2013 1038   CREATININE 0.95 07/11/2013 0939   CREATININE 0.91 05/22/2013 1115   CALCIUM 9.2 12/09/2013 1038   CALCIUM 9.1 07/11/2013 0939   PROT 6.7 12/09/2013 1038   PROT 6.6 03/22/2011 1429   ALBUMIN 3.4* 12/09/2013 1038   ALBUMIN 3.9 03/22/2011 1429   AST 30 12/09/2013 1038   AST 17 03/22/2011 1429   ALT 30 12/09/2013 1038   ALT 8 03/22/2011 1429   ALKPHOS 92 12/09/2013 1038   ALKPHOS 90 03/22/2011 1429   BILITOT 0.50 12/09/2013 1038   BILITOT 0.8 03/22/2011 1429   GFRNONAA 58* 05/22/2013 1115   GFRAA 68* 05/22/2013 1115    I No results found for this basename: SPEP, UPEP,  kappa and lambda light chains    Lab Results  Component Value Date   WBC 5.4 12/09/2013   NEUTROABS 3.4 12/09/2013   HGB 13.7 12/09/2013   HCT 42.5 12/09/2013   MCV 87.5 12/09/2013   PLT 277 12/09/2013      Chemistry      Component Value Date/Time   NA 141 12/09/2013 1038   NA 139 07/11/2013 0939   K 4.6 12/09/2013 1038   K 4.9 07/11/2013 0939   CL 102  07/11/2013 0939  CO2 26 12/09/2013 1038   CO2 27 07/11/2013 0939   BUN 20.0 12/09/2013 1038   BUN 15 07/11/2013 0939   CREATININE 1.0 12/09/2013 1038   CREATININE 0.95 07/11/2013 0939   CREATININE 0.91 05/22/2013 1115      Component Value Date/Time   CALCIUM 9.2 12/09/2013 1038   CALCIUM 9.1 07/11/2013 0939   ALKPHOS 92 12/09/2013 1038   ALKPHOS 90 03/22/2011 1429   AST 30 12/09/2013 1038   AST 17 03/22/2011 1429   ALT 30 12/09/2013 1038   ALT 8 03/22/2011 1429   BILITOT 0.50 12/09/2013 1038   BILITOT 0.8 03/22/2011 1429       No results found for this basename: LABCA2    No components found with this basename: LABCA125    No results found for this basename: INR,  in the last 168 hours  Urinalysis    Component Value Date/Time   COLORURINE LT. YELLOW 11/12/2008 [place] 11/12/2008 1508   LABSPEC >=1.030 11/12/2008 1508   PHURINE 5.0 11/12/2008 1508   GLUCOSEU NEGATIVE 11/12/2008 [place] 11/12/2008 [place] 11/12/2008 1508   UROBILINOGEN 0.2 11/12/2008 1508   NITRITE NEGATIVE 11/12/2008 1508   LEUKOCYTESUR SMALL 11/12/2008 1508    STUDIES: Dg Tibia/fibula Right  11/15/2013   CLINICAL DATA:  Fall 1 month ago, scrape on anterior right lower leg not healing  EXAM: RIGHT TIBIA AND FIBULA - 2 VIEW  COMPARISON:  None.  FINDINGS: There is no evidence of fracture or other focal bone lesions. No focal soft tissue irregularity. Degenerative arthritis noted in incompletely imaged knee joint.  IMPRESSION: Negative.   Electronically Signed   By: Jacqulynn Cadet M.D.   On: 11/15/2013 10:52    ASSESSMENT: 80 y.o. [place] Daniel  (1) status post left modified radical mastectomy 05/25/2000 for a pT1(mic) pN2, stage IIIA invasive ductal carcinoma, grade 3 estrogen receptor 93% positive, progesterone receptor 17% positive, with an MIB-1 of 38% and no HER-2 amplification by FISH  (2) status post adjuvant chemotherapy with docetaxel, doxorubicin and cyclophosphamide x6,  followed by adjuvant radiation to the left chest wall, followed by anastrozole which was continued until may of 2011  (3) status post right lumpectomy with sentinel lymph node biopsy 05/27/2013 for a pT1c pN1, stage IIA invasive ductal carcinoma, grade 2, estrogen and progesterone receptor strongly positive, HER-2 not amplified, with an MIB-1 of 55%  (4) completed adjuvant radiation to the right breast, axilla, and right supraclavicular region 09/08/2013  (5) started exemestane 12/09/2013  PLAN: We spent approximately one hour today going over Ambrosia's situation. She had a locally advanced tumor in 2001, and did very well for over 10 years, while on anastrozole. She now has a separate, contralateral tumor, which is early-stage. She completed her local treatment and now needs to start systemic therapy, which will consist of anti-estrogens, more specifically exemestane.  She has a good understanding of the possible toxicities, side effects and complications of this agent, as well as the possible benefits. She tells me she has never had a bone density, or at any rate non-for "a long time". I am setting her up for a bone density in November, before she returns to see me in December. I also alerted Lindy to possible cost issues, and if the cost of this drug for her seen size she will give [place] a call. The plan is to continue exemestane for at least 5 years.  Kendallyn has a good understanding of  the overall plan. She agrees with it. She knows the goal of treatment in her case is cure. She will call with any problems that may develop before her next visit here.  Chauncey Cruel, MD     6/2/201511:10 AM

## 2014-01-05 ENCOUNTER — Ambulatory Visit: Payer: Medicare Other

## 2014-01-19 ENCOUNTER — Telehealth: Payer: Self-pay | Admitting: *Deleted

## 2014-01-19 NOTE — Telephone Encounter (Signed)
Pt states that eliquis is to much for her afford.

## 2014-01-19 NOTE — Telephone Encounter (Signed)
LMTCB

## 2014-01-20 NOTE — Telephone Encounter (Signed)
Pt pays $45/month for eliquis but her chemo drug is several hundred dollars per month.She is currently in the doughnut hole till sept.I was able to give her a 6 we supply with plenty of samples left

## 2014-02-19 ENCOUNTER — Telehealth: Payer: Self-pay | Admitting: *Deleted

## 2014-02-19 NOTE — Telephone Encounter (Signed)
Received call from pt stating that she is out of her exemestane & wants to know if there is another drug that she can take that is less expensive.  She reports being in the donut hole with her insurance & it is costing her $195.95 month & along with another drug that she takes from another physician, it is costing her $500.  She reports that she has an appt in Dec to see Dr Jana Hakim & will be away from home b/t 10 am & 3:30 pm.  She can be reached on her cell # b/t these times @ [phone].  Message left for Lenice/Pt Financial Advocate to see if there are some financial options for her.  Will also leave this message for Dr. Jana Hakim upon his return next week.

## 2014-02-25 ENCOUNTER — Other Ambulatory Visit: Payer: Self-pay | Admitting: Internal Medicine

## 2014-02-25 DIAGNOSIS — Z853 Personal history of malignant neoplasm of breast: Secondary | ICD-10-CM

## 2014-03-02 ENCOUNTER — Ambulatory Visit (HOSPITAL_COMMUNITY)
Admission: RE | Admit: 2014-03-02 | Discharge: 2014-03-02 | Disposition: A | Discharge status: Home / Self Care | Payer: Medicare Other | Source: Ambulatory Visit | Attending: Internal Medicine | Admitting: Internal Medicine

## 2014-03-02 ENCOUNTER — Other Ambulatory Visit (HOSPITAL_COMMUNITY): Payer: Self-pay | Admitting: Internal Medicine

## 2014-03-02 ENCOUNTER — Other Ambulatory Visit: Payer: Self-pay | Admitting: *Deleted

## 2014-03-02 ENCOUNTER — Encounter: Payer: Self-pay | Admitting: *Deleted

## 2014-03-02 DIAGNOSIS — C50911 Malignant neoplasm of unspecified site of right female breast: Secondary | ICD-10-CM

## 2014-03-02 DIAGNOSIS — R059 Cough, unspecified: Secondary | ICD-10-CM | POA: Diagnosis present

## 2014-03-02 DIAGNOSIS — I700 Atherosclerosis of aorta: Secondary | ICD-10-CM | POA: Insufficient documentation

## 2014-03-02 DIAGNOSIS — Z9010 Acquired absence of unspecified breast and nipple: Secondary | ICD-10-CM | POA: Diagnosis not present

## 2014-03-02 DIAGNOSIS — K449 Diaphragmatic hernia without obstruction or gangrene: Secondary | ICD-10-CM | POA: Diagnosis not present

## 2014-03-02 DIAGNOSIS — R05 Cough: Secondary | ICD-10-CM

## 2014-03-02 DIAGNOSIS — C50912 Malignant neoplasm of unspecified site of left female breast: Secondary | ICD-10-CM

## 2014-03-02 DIAGNOSIS — I517 Cardiomegaly: Secondary | ICD-10-CM | POA: Diagnosis not present

## 2014-03-02 MED ORDER — EXEMESTANE 25 MG PO TABS: 25.0000 mg | ORAL_TABLET | Freq: Every day | ORAL | Status: DC

## 2014-03-02 NOTE — Progress Notes (Signed)
This RN was contacted by [address] with request for a prescription for aromasin to be faxed for medication dispense ( free to patient ) for a 3 month supply.  Prescription obtained and faxed to [phone] attention to [facility].

## 2014-03-09 ENCOUNTER — Ambulatory Visit (INDEPENDENT_AMBULATORY_CARE_PROVIDER_SITE_OTHER): Payer: Medicare Other | Admitting: Internal Medicine

## 2014-03-09 ENCOUNTER — Encounter: Payer: Self-pay | Admitting: Internal Medicine

## 2014-03-09 VITALS — BP 132/82 | HR 70 | Ht 61.0 in | Wt 191.0 lb

## 2014-03-09 DIAGNOSIS — I4819 Other persistent atrial fibrillation: Secondary | ICD-10-CM

## 2014-03-09 DIAGNOSIS — T464X5A Adverse effect of angiotensin-converting-enzyme inhibitors, initial encounter: Principal | ICD-10-CM

## 2014-03-09 DIAGNOSIS — I480 Paroxysmal atrial fibrillation: Secondary | ICD-10-CM

## 2014-03-09 DIAGNOSIS — R058 Other specified cough: Secondary | ICD-10-CM

## 2014-03-09 DIAGNOSIS — R059 Cough, unspecified: Secondary | ICD-10-CM

## 2014-03-09 DIAGNOSIS — I4891 Unspecified atrial fibrillation: Secondary | ICD-10-CM

## 2014-03-09 DIAGNOSIS — T465X5A Adverse effect of other antihypertensive drugs, initial encounter: Secondary | ICD-10-CM

## 2014-03-09 DIAGNOSIS — R05 Cough: Secondary | ICD-10-CM | POA: Insufficient documentation

## 2014-03-09 LAB — SEDIMENTATION RATE: SED RATE: 9 mm/h (ref 0–22)

## 2014-03-09 NOTE — Assessment & Plan Note (Signed)
It is unclear whether her cough is due to [facility], her ACE inhibitor or neither. I will have her stop her ACE inhibitor and she will undergo watchful waiting. If her cough improves, then I would consider trying an ARB. If her ESR is very high then we would need to stop her amiodarone.

## 2014-03-09 NOTE — Addendum Note (Signed)
Addended by: Alvis Lemmings C on: 03/09/2014 02:19 PM   Modules accepted: Orders

## 2014-03-09 NOTE — Progress Notes (Signed)
HPI Mrs. Dancer returns today for followup. She is a very pleasant 80 year old woman with a history of paroxysmal atrial fibrillation which has become persistent. She was on flecainide for many years but eventually, the flecainide became ineffective.  She underwent mastectomy and XRT several months ago. The patient has developed a cough over the past month. She has been treated with anti-biotics without much relief. She coughs night and day. She is on an ACE inhibitor and amiodarone. She denies fever or chills.  Allergies  Allergen Reactions  . Atorvastatin Other (See Comments)    Myalgias  . Hydrocodone Other (See Comments)    GI distress.     Current Outpatient Prescriptions  Medication Sig Dispense Refill  . ALPRAZolam (XANAX) 0.25 MG tablet 0.25 mg. TAKE 1/2 TABLET AS NEEDED      . amiodarone (PACERONE) 200 MG tablet Take 1 tablet (200 mg total) by mouth daily.  90 tablet  3  . apixaban (ELIQUIS) 2.5 MG TABS tablet Take 1 tablet (2.5 mg total) by mouth 2 (two) times daily.  180 tablet  3  . Dextromethorphan-Guaifenesin (TUSSIN COUGH DM PO) Take by mouth.      Marland Kitchen exemestane (AROMASIN) 25 MG tablet Take 1 tablet (25 mg total) by mouth daily after breakfast.  90 tablet  0  . furosemide (LASIX) 40 MG tablet Take 1 tablet (40 mg total) by mouth as needed.  90 tablet  3  . OVER THE COUNTER MEDICATION Take 2 tablets by mouth daily. Sun Cherrella(sp?)  Herb supplement for skin      . potassium chloride SA (K-DUR,KLOR-CON) 20 MEQ tablet Take 1 tablet (20 mEq total) by mouth daily.  90 tablet  3  . pravastatin (PRAVACHOL) 20 MG tablet Take 1 tablet (20 mg total) by mouth daily.  90 tablet  3  . ramipril (ALTACE) 2.5 MG capsule Take 1 capsule (2.5 mg total) by mouth daily.  90 capsule  3  . ranitidine (ZANTAC) 150 MG tablet Take 150 mg by mouth at bedtime.      . verapamil (VERELAN PM) 180 MG 24 hr capsule Take 1 capsule (180 mg total) by mouth at bedtime.  90 capsule  3   No current  facility-administered medications for this visit.     Past Medical History  Diagnosis Date  . Herpes zoster   . Arteriosclerotic cardiovascular disease (ASCVD)      cath Feb '07- no obstructive dz - 20% proximal LAD only; EF 65%; possible coronary artery spasm  . Hyperlipidemia     Lipid profile in 11/2008:282, 176, 64, 201  . Polio 1946    at age 4  . Peripheral neuropathy   . Anxiety   . Hypertension     diastolic dysfunction; normal CMet and TSH in 11/2009; normal CBC in 04/2010  . GERD (gastroesophageal reflux disease)     hiatal hernia  . Rectal bleed   . Chest pain     Long-standing and atypical  . Atrial fibrillation     Paroxysmal on event recorder in 2009; regular supraventricular tachycardia at a rate of 150 also identified on that study representing either atrial flutter or PSVT; onset of persistent AF in 03/2011  . Chronic anticoagulation 04/12/2011  . Renal insufficiency   . Carcinoma of breast 2001    Left mastectomy with positive nodes  . Malignant neoplasm of breast (female), unspecified site 05/27/2013    right breast lumpectomy with positive node    ROS:  All systems reviewed and negative except as noted in the HPI.   Past Surgical History  Procedure Laterality Date  . Cholecystectomy  1999  . Colonoscopy  02/22/2011    Procedure: COLONOSCOPY;  Surgeon: Rogene Houston, MD;  [facility];  Service: Endoscopy;  Laterality: N/A;; performed for scant hematochezia  . Esophagogastroduodenoscopy  02/22/2011    Procedure: ESOPHAGOGASTRODUODENOSCOPY (EGD);  Surgeon: Rogene Houston, MD;  [facility];  Service: Endoscopy;  Laterality: N/A;  . Mastectomy  2001    Left;modified radical with lymph node dissection  . Port-a-cath removal      pac in and out 2002  . Dilation and curettage of uterus    . Eye surgery      both cataracts  . Breast lumpectomy with needle localization and axillary sentinel lymph node bx Right 05/27/2013     Procedure: BREAST LUMPECTOMY WITH NEEDLE LOCALIZATION AND AXILLARY SENTINEL LYMPH NODE BX;  Surgeon: Pedro Earls, MD;  Location: [place];  Service: General;  Laterality: Right;  . Breast surgery       Family History  Problem Relation Age of Onset  . Cancer Sister     lung  . Cancer Brother     lipoma  . Cancer Brother     kidney     History   Social History  . Marital Status: Widowed    Spouse Name: N/A    Number of Children: 2  . Years of Education: N/A   Occupational History  . homemaker    Social History Main Topics  . Smoking status: Never Smoker   . Smokeless tobacco: Never Used  . Alcohol Use: No  . Drug Use: No  . Sexual Activity: Not Currently   Other Topics Concern  . Not on file   Social History Narrative  . No narrative on file     BP 132/82  Pulse 70  Ht 5\' 1"  (1.549 m)  Wt 191 lb (86.637 kg)  BMI 36.11 kg/m2  SpO2 95%  Physical Exam:  Non-ill appearing 80 year old woman,NAD HEENT: Unremarkable Neck:  No JVD, no thyromegally Back:  No CVA tenderness Lungs:  Scattered wheezes and rales, no rhonchi HEART:  Regular rate rhythm, no murmurs, no rubs, no clicks Abd:  soft, obese, positive bowel sounds, no organomegally, no rebound, no guarding Ext:  2 plus pulses, no edema, no cyanosis, no clubbing Skin:  No rashes no nodules Neuro:  CN II through XII intact, motor grossly intact  EKG NSR  Assess/Plan:

## 2014-03-09 NOTE — Patient Instructions (Signed)
Your physician recommends that you schedule a follow-up appointment in: 8 weeks with Dr. Lovena Le  Your physician has recommended you make the following change in your medication:   STOP TAKING RAMIPRIL   Your physician recommends that you return for lab work today SED Rate  Thank you for choosing [facility]!!

## 2014-03-09 NOTE — Assessment & Plan Note (Signed)
She appears to be maintaining NSR. Her ECG today shows very small p waves but I think NSR, though cannot rule out an accelerated junctional rhythm. She has been coughing raising the possibilty of amio lung toxicity. I will have the patient check an ESR.

## 2014-03-20 ENCOUNTER — Ambulatory Visit (INDEPENDENT_AMBULATORY_CARE_PROVIDER_SITE_OTHER): Payer: Medicare Other | Admitting: Urology

## 2014-03-20 DIAGNOSIS — N3010 Interstitial cystitis (chronic) without hematuria: Secondary | ICD-10-CM

## 2014-03-20 DIAGNOSIS — N3000 Acute cystitis without hematuria: Secondary | ICD-10-CM

## 2014-03-23 ENCOUNTER — Telehealth: Payer: Self-pay | Admitting: *Deleted

## 2014-03-23 NOTE — Telephone Encounter (Signed)
Received call from patient's sister Caren Griffins in reference to paperwork to confirm diagnosis so that copay release can be done so that patient can receive medical assistance. Left message with Lenise.

## 2014-03-27 ENCOUNTER — Encounter: Payer: Self-pay | Admitting: Oncology

## 2014-03-27 NOTE — Progress Notes (Signed)
Received letter from Patient [employer]. Pt is approved for $5000 for her chemo drugs for 12 months from 03/02/14 to 03/03/15. They will cover expenses from 09/03/13.  I forwarded copy of letter and proof of expenditure form to Rema Jasmine in the billing dept.

## 2014-04-01 ENCOUNTER — Telehealth: Payer: Self-pay | Admitting: Internal Medicine

## 2014-04-01 ENCOUNTER — Telehealth: Payer: Self-pay | Admitting: *Deleted

## 2014-04-01 NOTE — Telephone Encounter (Signed)
Eliquis ready for pick up in the front office. Notified pt.

## 2014-04-01 NOTE — Telephone Encounter (Signed)
Patient requesting samples of Eliquis / tgs

## 2014-04-13 ENCOUNTER — Encounter (INDEPENDENT_AMBULATORY_CARE_PROVIDER_SITE_OTHER): Payer: Self-pay

## 2014-04-13 ENCOUNTER — Ambulatory Visit
Admission: RE | Admit: 2014-04-13 | Discharge: 2014-04-13 | Disposition: A | Discharge status: Home / Self Care | Payer: Medicare Other | Source: Ambulatory Visit | Attending: Internal Medicine | Admitting: Internal Medicine

## 2014-04-13 DIAGNOSIS — Z853 Personal history of malignant neoplasm of breast: Secondary | ICD-10-CM

## 2014-04-16 ENCOUNTER — Ambulatory Visit
Admission: RE | Admit: 2014-04-16 | Discharge: 2014-04-16 | Disposition: A | Discharge status: Home / Self Care | Payer: Medicare Other | Source: Ambulatory Visit | Attending: Radiation Oncology | Admitting: Radiation Oncology

## 2014-04-16 ENCOUNTER — Encounter: Payer: Self-pay | Admitting: Radiation Oncology

## 2014-04-16 VITALS — BP 139/83 | HR 72 | Temp 97.7°F | Resp 12 | Wt 191.8 lb

## 2014-04-16 DIAGNOSIS — C50211 Malignant neoplasm of upper-inner quadrant of right female breast: Secondary | ICD-10-CM

## 2014-04-16 NOTE — Progress Notes (Signed)
She is currently in no pain. Pt complains of, Loss of Sleep, reports not sleeping well due to "mind gets to thinking." Pt right breast warm dry and intact, no edema, noted a pink tone to skin. Reports occasional "itching" under the breast fold.  The patient eats a regular, healthy diet.

## 2014-04-16 NOTE — Progress Notes (Signed)
Radiation Oncology         [phone] ________________________________  Name: [name] MRN: [id number]  Date: 04/16/2014  DOB: 05/05/1932  Follow-Up Visit Note  CC: Asencion Noble MD  Kaylyn Lim, MD  Diagnosis:   80 year old woman with bilateral breast cancers:  1. Left mastectomy by Dr. Margot Chimes in early 2002 followed by chest wall radiotherapy  2. T1c (1.5 cm) N1a (1/2 SLN positive) M0 estrogen receptor positive grade I invasive ductal carcinoma of the right breast - Stage IIA s/p breast conservation with radiation 07/21/2013-09/08/2013 where the whole right breast, supraclavicular region, and axilla were treated to 50.4 Gy in 28 fractions and the lumpectomy site was boosted to 60.4 Gy   Interval Since Last Radiation:  6  months  Narrative:  The patient returns today for routine follow-up.  She is currently in no pain. Pt complains of, Loss of Sleep, reports not sleeping well due to "mind gets to thinking." Pt right breast warm dry and intact, no edema, noted a pink tone to skin. Reports occasional "itching" under the breast fold.  The patient eats a regular, healthy diet                              ALLERGIES:  is allergic to atorvastatin and hydrocodone.  Meds: Current Outpatient Prescriptions  Medication Sig Dispense Refill  . ALPRAZolam (XANAX) 0.25 MG tablet 0.25 mg. TAKE 1/2 TABLET AS NEEDED      . amiodarone (PACERONE) 200 MG tablet Take 1 tablet (200 mg total) by mouth daily.  90 tablet  3  . apixaban (ELIQUIS) 2.5 MG TABS tablet Take 1 tablet (2.5 mg total) by mouth 2 (two) times daily.  180 tablet  3  . Dextromethorphan-Guaifenesin (TUSSIN COUGH DM PO) Take by mouth.      Marland Kitchen exemestane (AROMASIN) 25 MG tablet Take 1 tablet (25 mg total) by mouth daily after breakfast.  90 tablet  0  . furosemide (LASIX) 40 MG tablet Take 1 tablet (40 mg total) by mouth as needed.  90 tablet  3  . OVER THE COUNTER MEDICATION Take 2 tablets by mouth daily. Sun Cherrella(sp?)  Herb supplement  for skin      . potassium chloride SA (K-DUR,KLOR-CON) 20 MEQ tablet Take 1 tablet (20 mEq total) by mouth daily.  90 tablet  3  . pravastatin (PRAVACHOL) 20 MG tablet Take 1 tablet (20 mg total) by mouth daily.  90 tablet  3  . ranitidine (ZANTAC) 150 MG tablet Take 150 mg by mouth at bedtime.      . verapamil (VERELAN PM) 180 MG 24 hr capsule Take 1 capsule (180 mg total) by mouth at bedtime.  90 capsule  3   No current facility-administered medications for this encounter.    Physical Findings: The patient is in no acute distress. Patient is alert and oriented.  weight is 191 lb 12.8 oz (87 kg). Her oral temperature is 97.7 F (36.5 C). Her blood pressure is 139/83 and her pulse is 72. Her respiration is 12 and oxygen saturation is 96%. .The right breast shows slight hyperpigmentation. There is no dominant mass.  Cosmesis is good.  The left chest wall shows no recurrence. No significant changes.  Lab Findings: Lab Results  Component Value Date   WBC 5.4 12/09/2013   HGB 13.7 12/09/2013   HCT 42.5 12/09/2013   MCV 87.5 12/09/2013   PLT 277 12/09/2013    @LASTCHEM @  Radiographic Findings: Mm Digital Diagnostic Unilat R  04/13/2014   CLINICAL DATA:  Malignant lumpectomy of the right breast in November, 2014 with adjuvant radiation therapy. Prior malignant left mastectomy in 2001 with adjuvant radiation therapy and chemotherapy. Annual evaluation.  EXAM: DIGITAL DIAGNOSTIC RIGHT MAMMOGRAM WITH CAD  COMPARISON:  05/27/2013, 04/22/2013, dating back to 11/14/2007.  ACR Breast Density Category c: The breast tissue is heterogeneously dense, which may obscure small masses.  FINDINGS: CC and MLO views of the right breast and a spot tangential view of the lumpectomy site in the upper outer right breast were obtained. Post lumpectomy scarring and post radiation skin thickening and trabecular thickening. Stable benign calcifications dating back to 2009. No findings suspicious for malignancy in the right  breast.  Mammographic images were processed with CAD.  IMPRESSION: No specific mammographic evidence of malignancy, right breast. Expected post lumpectomy and post radiation changes.  RECOMMENDATION: Diagnostic right mammogram in 1 year.  I have discussed the findings and recommendations with the patient. Results were also provided in writing at the conclusion of the visit. If applicable, a reminder letter will be sent to the patient regarding the next appointment.  BI-RADS CATEGORY  2: Benign.   Electronically Signed   By: Evangeline Dakin M.D.   On: 04/13/2014 11:10    Impression:  The patient is recovering from the effects of radiation.  She has no evidence of recurrence.  Plan:  The patient is following up with Dr. Jana Hakim.  I will see her in the future on an as needed basis.  _____________________________________  Sheral Apley Tammi Klippel, M.D.

## 2014-05-06 ENCOUNTER — Encounter: Payer: Self-pay | Admitting: Internal Medicine

## 2014-05-06 ENCOUNTER — Ambulatory Visit (INDEPENDENT_AMBULATORY_CARE_PROVIDER_SITE_OTHER): Payer: Medicare Other | Admitting: Internal Medicine

## 2014-05-06 VITALS — BP 124/74 | HR 75 | Ht 61.0 in | Wt 193.0 lb

## 2014-05-06 DIAGNOSIS — E785 Hyperlipidemia, unspecified: Secondary | ICD-10-CM

## 2014-05-06 DIAGNOSIS — Z7901 Long term (current) use of anticoagulants: Secondary | ICD-10-CM

## 2014-05-06 DIAGNOSIS — I10 Essential (primary) hypertension: Secondary | ICD-10-CM

## 2014-05-06 DIAGNOSIS — I480 Paroxysmal atrial fibrillation: Secondary | ICD-10-CM

## 2014-05-06 NOTE — Assessment & Plan Note (Signed)
Her blood pressure is well controlled. We considered stopping her verapamil but ultimately decided not to.

## 2014-05-06 NOTE — Progress Notes (Signed)
HPI [name] returns today for followup. She is a very pleasant 80 year old woman with a history of paroxysmal atrial fibrillation which has become persistent. She was on flecainide for many years but eventually, the flecainide became ineffective.  She underwent mastectomy and XRT several months ago. The patient has developed a cough which was found to be due to her ACE inhibitor.  She has continued taking her amiodarone and cough has resolved. ESR was normal. She c/o the expense of her blood thinners. She is in donut hole.  She has occaisional palpitations.   Allergies  Allergen Reactions  . Atorvastatin Other (See Comments)    Myalgias  . Hydrocodone Other (See Comments)    GI distress.     Current Outpatient Prescriptions  Medication Sig Dispense Refill  . ALPRAZolam (XANAX) 0.25 MG tablet 0.25 mg. TAKE 1/2 TABLET AS NEEDED      . amiodarone (PACERONE) 200 MG tablet Take 1 tablet (200 mg total) by mouth daily.  90 tablet  3  . apixaban (ELIQUIS) 2.5 MG TABS tablet Take 1 tablet (2.5 mg total) by mouth 2 (two) times daily.  180 tablet  3  . Dextromethorphan-Guaifenesin (TUSSIN COUGH DM PO) Take by mouth.      Marland Kitchen exemestane (AROMASIN) 25 MG tablet Take 1 tablet (25 mg total) by mouth daily after breakfast.  90 tablet  0  . furosemide (LASIX) 40 MG tablet Take 1 tablet (40 mg total) by mouth as needed.  90 tablet  3  . OVER THE COUNTER MEDICATION Take 2 tablets by mouth daily. Sun Cherrella(sp?)  Herb supplement for skin      . potassium chloride SA (K-DUR,KLOR-CON) 20 MEQ tablet Take 1 tablet (20 mEq total) by mouth daily.  90 tablet  3  . pravastatin (PRAVACHOL) 20 MG tablet Take 1 tablet (20 mg total) by mouth daily.  90 tablet  3  . ranitidine (ZANTAC) 150 MG tablet Take 150 mg by mouth at bedtime.      . verapamil (VERELAN PM) 180 MG 24 hr capsule Take 1 capsule (180 mg total) by mouth at bedtime.  90 capsule  3   No current facility-administered medications for this visit.       Past Medical History  Diagnosis Date  . Herpes zoster   . Arteriosclerotic cardiovascular disease (ASCVD)      cath Feb '07- no obstructive dz - 20% proximal LAD only; EF 65%; possible coronary artery spasm  . Hyperlipidemia     Lipid profile in 11/2008:282, 176, 64, 201  . Polio 1946    at age 35  . Peripheral neuropathy   . Anxiety   . Hypertension     diastolic dysfunction; normal CMet and TSH in 11/2009; normal CBC in 04/2010  . GERD (gastroesophageal reflux disease)     hiatal hernia  . Rectal bleed   . Chest pain     Long-standing and atypical  . Atrial fibrillation     Paroxysmal on event recorder in 2009; regular supraventricular tachycardia at a rate of 150 also identified on that study representing either atrial flutter or PSVT; onset of persistent AF in 03/2011  . Chronic anticoagulation 04/12/2011  . Renal insufficiency   . Carcinoma of breast 2001    Left mastectomy with positive nodes  . Malignant neoplasm of breast (female), unspecified site 05/27/2013    right breast lumpectomy with positive node    ROS:   All systems reviewed and negative except as  noted in the HPI.   Past Surgical History  Procedure Laterality Date  . Cholecystectomy  1999  . Colonoscopy  02/22/2011    Procedure: COLONOSCOPY;  Surgeon: Rogene Houston, MD;  [facility];  Service: Endoscopy;  Laterality: N/A;; performed for scant hematochezia  . Esophagogastroduodenoscopy  02/22/2011    Procedure: ESOPHAGOGASTRODUODENOSCOPY (EGD);  Surgeon: Rogene Houston, MD;  [facility]: [facility];  Service: Endoscopy;  Laterality: N/A;  . Mastectomy  2001    Left;modified radical with lymph node dissection  . Port-a-cath removal      pac in and out 2002  . Dilation and curettage of uterus    . Eye surgery      both cataracts  . Breast lumpectomy with needle localization and axillary sentinel lymph node bx Right 05/27/2013    Procedure: BREAST LUMPECTOMY WITH NEEDLE LOCALIZATION AND  AXILLARY SENTINEL LYMPH NODE BX;  Surgeon: Pedro Earls, MD;  Location: [facility];  Service: General;  Laterality: Right;  . Breast surgery       Family History  Problem Relation Age of Onset  . Cancer Sister     lung  . Cancer Brother     lipoma  . Cancer Brother     kidney     History   Social History  . Marital Status: Widowed    Spouse Name: N/A    Number of Children: 2  . Years of Education: N/A   Occupational History  . homemaker    Social History Main Topics  . Smoking status: Never Smoker   . Smokeless tobacco: Never Used  . Alcohol Use: No  . Drug Use: No  . Sexual Activity: Not Currently   Other Topics Concern  . Not on file   Social History Narrative  . No narrative on file     There were no vitals taken for this visit.  Physical Exam:  Non-ill appearing 80 year old woman,NAD HEENT: Unremarkable Neck:  No JVD, no thyromegally Back:  No CVA tenderness Lungs:  Scattered wheezes and rales, no rhonchi HEART:  Regular rate rhythm, no murmurs, no rubs, no clicks Abd:  soft, obese, positive bowel sounds, no organomegally, no rebound, no guarding Ext:  2 plus pulses, no edema, no cyanosis, no clubbing Skin:  No rashes no nodules Neuro:  CN II through XII intact, motor grossly intact   Assess/Plan:

## 2014-05-06 NOTE — Patient Instructions (Signed)
We have given you samples of Eliquis 2.5 mg plus a month free card that should last you til February.

## 2014-05-06 NOTE — Assessment & Plan Note (Signed)
I reviewed her fasting lipid profile. Her lipids are now good. She will continue her Pravachol.

## 2014-05-06 NOTE — Assessment & Plan Note (Signed)
We discussed stopping her Eliquis and starting warfarin. She wants to stay on Eliquis. She asked about taking ASA and I informed her that it would not protect her from having a stroke.

## 2014-05-06 NOTE — Assessment & Plan Note (Signed)
She is maintaining NSR. She will continue amiodarone.

## 2014-05-07 ENCOUNTER — Encounter (INDEPENDENT_AMBULATORY_CARE_PROVIDER_SITE_OTHER): Payer: Self-pay | Admitting: Internal Medicine

## 2014-05-07 ENCOUNTER — Encounter (INDEPENDENT_AMBULATORY_CARE_PROVIDER_SITE_OTHER): Payer: Self-pay | Admitting: *Deleted

## 2014-05-07 ENCOUNTER — Ambulatory Visit (INDEPENDENT_AMBULATORY_CARE_PROVIDER_SITE_OTHER): Payer: Medicare Other | Admitting: Internal Medicine

## 2014-05-07 VITALS — BP 120/70 | HR 64 | Temp 97.5°F | Ht 61.5 in | Wt 191.0 lb

## 2014-05-07 DIAGNOSIS — R1314 Dysphagia, pharyngoesophageal phase: Secondary | ICD-10-CM

## 2014-05-07 DIAGNOSIS — R6881 Early satiety: Secondary | ICD-10-CM | POA: Insufficient documentation

## 2014-05-07 NOTE — Patient Instructions (Signed)
Esophagram. 4-5 small meals a day.

## 2014-05-07 NOTE — Progress Notes (Signed)
Subjective:    Patient ID: [name], female    DOB: 1932-04-22, 80 y.o.   MRN: [id number]  HPI Here today with c/o that she thinks she is having a GB attack though her GB taken out in 1999.  She has epigastric pain which comes and goes. She says she will eat a couple of bites and then she would feel full. Symptoms on and off since June. She feels bloated with a lot of gas. She says last night, she burped and food came back up into her mouth. She says foods are not lodging in her esophagus. She says foods feel like they are slow to go down her esophagus. She has a lot of "gas". There has been no weight loss. Appetite is not good, because of the bloating. Now taking Nexium BID. She is not having heart burn.  She is having a BM every day.   11/22/2010 EGD: IMPRESSION:  1. Gastric polyp (211.1).  2. Large hiatal hernia with a patulous diaphragmatic hiatus.  3. No source of heme positive stool identified. Colonoscopy: heme positive stool. IMPRESSION: Heme positive stool, without evident source on today's  examination (792.1).  PLAN: Consider sigmoidoscopic evaluation in five years for ongoing colon  cancer screening.   Review of Systems Past Medical History  Diagnosis Date  . Herpes zoster   . Arteriosclerotic cardiovascular disease (ASCVD)      cath Feb '07- no obstructive dz - 20% proximal LAD only; EF 65%; possible coronary artery spasm  . Hyperlipidemia     Lipid profile in 11/2008:282, 176, 64, 201  . Polio 1946    at age 57  . Peripheral neuropathy   . Anxiety   . Hypertension     diastolic dysfunction; normal CMet and TSH in 11/2009; normal CBC in 04/2010  . GERD (gastroesophageal reflux disease)     hiatal hernia  . Rectal bleed   . Chest pain     Long-standing and atypical  . Atrial fibrillation     Paroxysmal on event recorder in 2009; regular supraventricular tachycardia at a rate of 150 also identified on that study representing either atrial flutter or PSVT;  onset of persistent AF in 03/2011  . Chronic anticoagulation 04/12/2011  . Renal insufficiency   . Carcinoma of breast 2001    Left mastectomy with positive nodes  . Malignant neoplasm of breast (female), unspecified site 05/27/2013    right breast lumpectomy with positive node    Past Surgical History  Procedure Laterality Date  . Cholecystectomy  1999  . Colonoscopy  02/22/2011    Procedure: COLONOSCOPY;  Surgeon: Rogene Houston, MD;  [facility];  Service: Endoscopy;  Laterality: N/A;; performed for scant hematochezia  . Esophagogastroduodenoscopy  02/22/2011    Procedure: ESOPHAGOGASTRODUODENOSCOPY (EGD);  Surgeon: Rogene Houston, MD;  [facility];  Service: Endoscopy;  Laterality: N/A;  . Mastectomy  2001    Left;modified radical with lymph node dissection  . Port-a-cath removal      pac in and out 2002  . Dilation and curettage of uterus    . Eye surgery      both cataracts  . Breast lumpectomy with needle localization and axillary sentinel lymph node bx Right 05/27/2013    Procedure: BREAST LUMPECTOMY WITH NEEDLE LOCALIZATION AND AXILLARY SENTINEL LYMPH NODE BX;  Surgeon: Pedro Earls, MD;  Location: [facility];  Service: General;  Laterality: Right;  . Breast surgery  Allergies  Allergen Reactions  . Atorvastatin Other (See Comments)    Myalgias  . Hydrocodone Other (See Comments)    GI distress.    Current Outpatient Prescriptions on File Prior to Visit  Medication Sig Dispense Refill  . ALPRAZolam (XANAX) 0.25 MG tablet 0.25 mg. TAKE 1/2 TABLET AS NEEDED      . amiodarone (PACERONE) 200 MG tablet Take 1 tablet (200 mg total) by mouth daily.  90 tablet  3  . apixaban (ELIQUIS) 2.5 MG TABS tablet Take 1 tablet (2.5 mg total) by mouth 2 (two) times daily.  180 tablet  3  . exemestane (AROMASIN) 25 MG tablet Take 1 tablet (25 mg total) by mouth daily after breakfast.  90 tablet  0  . furosemide (LASIX) 40 MG tablet Take 1  tablet (40 mg total) by mouth as needed.  90 tablet  3  . OVER THE COUNTER MEDICATION Take 2 tablets by mouth daily. Sun Cherrella(sp?)  Herb supplement for skin      . potassium chloride SA (K-DUR,KLOR-CON) 20 MEQ tablet Take 1 tablet (20 mEq total) by mouth daily.  90 tablet  3  . pravastatin (PRAVACHOL) 20 MG tablet Take 1 tablet (20 mg total) by mouth daily.  90 tablet  3  . verapamil (VERELAN PM) 180 MG 24 hr capsule Take 1 capsule (180 mg total) by mouth at bedtime.  90 capsule  3   No current facility-administered medications on file prior to visit.        Objective:   Physical Exam Filed Vitals:   05/07/14 1417  BP: 120/70  Pulse: 64  Temp: 97.5 F (36.4 C)  Height: 5' 1.5" (1.562 m)  Weight: 191 lb (86.637 kg)   Alert and oriented. Skin warm and dry. Oral mucosa is moist.   . Sclera anicteric, conjunctivae is pink. Thyroid not enlarged. No cervical lymphadenopathy. Lungs clear. Heart regular rate and rhythm.  Abdomen is soft. Bowel sounds are positive. No hepatomegaly. No abdominal masses felt. No tenderness. 1+ edema to lower extremities.      Assessment & Plan:  Early satiety, bloating. Known hx of large hiatal hernia. Some dysphagia. Plan: Esophagram. Further recommendations to follow.

## 2014-05-11 ENCOUNTER — Ambulatory Visit (HOSPITAL_COMMUNITY)
Admission: RE | Admit: 2014-05-11 | Discharge: 2014-05-11 | Disposition: A | Discharge status: Home / Self Care | Payer: Medicare Other | Source: Ambulatory Visit | Attending: Internal Medicine | Admitting: Internal Medicine

## 2014-05-11 DIAGNOSIS — R1314 Dysphagia, pharyngoesophageal phase: Secondary | ICD-10-CM | POA: Diagnosis present

## 2014-05-11 DIAGNOSIS — R6881 Early satiety: Secondary | ICD-10-CM

## 2014-05-12 ENCOUNTER — Other Ambulatory Visit (HOSPITAL_COMMUNITY): Payer: Medicare Other

## 2014-05-13 ENCOUNTER — Telehealth (INDEPENDENT_AMBULATORY_CARE_PROVIDER_SITE_OTHER): Payer: Self-pay | Admitting: *Deleted

## 2014-05-13 ENCOUNTER — Encounter (INDEPENDENT_AMBULATORY_CARE_PROVIDER_SITE_OTHER): Payer: Self-pay | Admitting: *Deleted

## 2014-05-13 ENCOUNTER — Other Ambulatory Visit (INDEPENDENT_AMBULATORY_CARE_PROVIDER_SITE_OTHER): Payer: Self-pay | Admitting: *Deleted

## 2014-05-13 DIAGNOSIS — R1310 Dysphagia, unspecified: Secondary | ICD-10-CM

## 2014-05-13 NOTE — Telephone Encounter (Signed)
Patient is scheduled for endoscopy with dilatation 11/24 and needs to stop Eliquis 2 days prior -- is this ok -- thanks

## 2014-05-13 NOTE — Telephone Encounter (Signed)
OK to hold Eliquis 48 hours prior to endoscopy due to dilatation.  Restart Eliquis night of procedure.

## 2014-05-13 NOTE — Telephone Encounter (Signed)
Patient aware.

## 2014-05-19 ENCOUNTER — Ambulatory Visit (HOSPITAL_COMMUNITY)
Admission: RE | Admit: 2014-05-19 | Discharge: 2014-05-19 | Disposition: A | Discharge status: Home / Self Care | Payer: Medicare Other | Source: Ambulatory Visit | Attending: Oncology | Admitting: Oncology

## 2014-05-19 DIAGNOSIS — Z780 Asymptomatic menopausal state: Secondary | ICD-10-CM | POA: Diagnosis present

## 2014-05-19 DIAGNOSIS — Z853 Personal history of malignant neoplasm of breast: Secondary | ICD-10-CM | POA: Insufficient documentation

## 2014-05-19 DIAGNOSIS — M8580 Other specified disorders of bone density and structure, unspecified site: Secondary | ICD-10-CM | POA: Insufficient documentation

## 2014-06-02 ENCOUNTER — Ambulatory Visit (HOSPITAL_COMMUNITY)
Admission: RE | Admit: 2014-06-02 | Discharge: 2014-06-02 | Disposition: A | Discharge status: Home / Self Care | Payer: Medicare Other | Source: Ambulatory Visit | Attending: Internal Medicine | Admitting: Internal Medicine

## 2014-06-02 ENCOUNTER — Encounter (HOSPITAL_COMMUNITY): Payer: Self-pay

## 2014-06-02 ENCOUNTER — Encounter (HOSPITAL_COMMUNITY)
Admission: RE | Disposition: A | Discharge status: Home / Self Care | Payer: Self-pay | Source: Ambulatory Visit | Attending: Internal Medicine

## 2014-06-02 DIAGNOSIS — I10 Essential (primary) hypertension: Secondary | ICD-10-CM | POA: Diagnosis not present

## 2014-06-02 DIAGNOSIS — F419 Anxiety disorder, unspecified: Secondary | ICD-10-CM | POA: Insufficient documentation

## 2014-06-02 DIAGNOSIS — R1310 Dysphagia, unspecified: Secondary | ICD-10-CM | POA: Insufficient documentation

## 2014-06-02 DIAGNOSIS — R1314 Dysphagia, pharyngoesophageal phase: Secondary | ICD-10-CM

## 2014-06-02 DIAGNOSIS — C773 Secondary and unspecified malignant neoplasm of axilla and upper limb lymph nodes: Secondary | ICD-10-CM | POA: Insufficient documentation

## 2014-06-02 DIAGNOSIS — C50911 Malignant neoplasm of unspecified site of right female breast: Secondary | ICD-10-CM | POA: Insufficient documentation

## 2014-06-02 DIAGNOSIS — R1110 Vomiting, unspecified: Secondary | ICD-10-CM | POA: Insufficient documentation

## 2014-06-02 DIAGNOSIS — K449 Diaphragmatic hernia without obstruction or gangrene: Secondary | ICD-10-CM | POA: Insufficient documentation

## 2014-06-02 DIAGNOSIS — E785 Hyperlipidemia, unspecified: Secondary | ICD-10-CM | POA: Insufficient documentation

## 2014-06-02 DIAGNOSIS — R6881 Early satiety: Secondary | ICD-10-CM

## 2014-06-02 DIAGNOSIS — K2970 Gastritis, unspecified, without bleeding: Secondary | ICD-10-CM

## 2014-06-02 DIAGNOSIS — K3189 Other diseases of stomach and duodenum: Secondary | ICD-10-CM

## 2014-06-02 DIAGNOSIS — K219 Gastro-esophageal reflux disease without esophagitis: Secondary | ICD-10-CM | POA: Diagnosis not present

## 2014-06-02 DIAGNOSIS — Z79899 Other long term (current) drug therapy: Secondary | ICD-10-CM | POA: Diagnosis not present

## 2014-06-02 HISTORY — PX: ESOPHAGOGASTRODUODENOSCOPY: SHX5428

## 2014-06-02 HISTORY — DX: Reserved for inherently not codable concepts without codable children: IMO0001

## 2014-06-02 HISTORY — PX: MALONEY DILATION: SHX5535

## 2014-06-02 SURGERY — EGD (ESOPHAGOGASTRODUODENOSCOPY)
Anesthesia: Moderate Sedation

## 2014-06-02 MED ORDER — MEPERIDINE HCL 50 MG/ML IJ SOLN
INTRAMUSCULAR | Status: AC
  Filled 2014-06-02: qty 1

## 2014-06-02 MED ORDER — BUTAMBEN-TETRACAINE-BENZOCAINE 2-2-14 % EX AERO
INHALATION_SPRAY | CUTANEOUS | Status: DC | PRN
  Administered 2014-06-02: 2 via TOPICAL

## 2014-06-02 MED ORDER — SODIUM CHLORIDE 0.9 % IV SOLN
INTRAVENOUS | Status: DC
Start: 2014-06-02 — End: 2014-06-02
  Administered 2014-06-02: 07:00:00 via INTRAVENOUS

## 2014-06-02 MED ORDER — MIDAZOLAM HCL 5 MG/5ML IJ SOLN
INTRAMUSCULAR | Status: AC
  Filled 2014-06-02: qty 10

## 2014-06-02 MED ORDER — MEPERIDINE HCL 50 MG/ML IJ SOLN
INTRAMUSCULAR | Status: DC | PRN
  Administered 2014-06-02: 25 mg via INTRAVENOUS

## 2014-06-02 MED ORDER — STERILE WATER FOR IRRIGATION IR SOLN
Status: DC | PRN
  Administered 2014-06-02: 08:00:00

## 2014-06-02 MED ORDER — MIDAZOLAM HCL 5 MG/5ML IJ SOLN
INTRAMUSCULAR | Status: DC | PRN
  Administered 2014-06-02 (×2): 2 mg via INTRAVENOUS
  Administered 2014-06-02: 1 mg via INTRAVENOUS

## 2014-06-02 NOTE — H&P (Signed)
[name] is an 80 y.o. female.   Chief Complaint: Patient is here for EGD and ED. HPI: Patient is 80 year old Caucasian female with multiple medical problems including chronic GERD who presents with few months history of early satiety postprandial regurgitation vomiting as well as dysphagia. Her esophagus was last dilated in August 2012. She had barium study recently revealing narrowing at distal esophagus proximal to GE junction delaying passage of barium pill. She denies epigastric pain melena or rectal bleeding. She states she has lost 6 pounds since her symptoms began. She does not take OTC NSAIDs. She's been off her anticoagulants for 2 days.  Past Medical History  Diagnosis Date  . Herpes zoster   . Arteriosclerotic cardiovascular disease (ASCVD)      cath Feb '07- no obstructive dz - 20% proximal LAD only; EF 65%; possible coronary artery spasm  . Hyperlipidemia     Lipid profile in 11/2008:282, 176, 64, 201  . Polio 1946    at age 48  . Peripheral neuropathy   . Anxiety   . Hypertension     diastolic dysfunction; normal CMet and TSH in 11/2009; normal CBC in 04/2010  . GERD (gastroesophageal reflux disease)     hiatal hernia  . Rectal bleed   . Chest pain     Long-standing and atypical  . Atrial fibrillation     Paroxysmal on event recorder in 2009; regular supraventricular tachycardia at a rate of 150 also identified on that study representing either atrial flutter or PSVT; onset of persistent AF in 03/2011  . Chronic anticoagulation 04/12/2011  . Renal insufficiency   . Carcinoma of breast 2001    Left mastectomy with positive nodes  . Malignant neoplasm of breast (female), unspecified site 05/27/2013    right breast lumpectomy with positive node  . Shortness of breath dyspnea     Past Surgical History  Procedure Laterality Date  . Cholecystectomy  1999  . Colonoscopy  02/22/2011    Procedure: COLONOSCOPY;  Surgeon: Rogene Houston, MD;  [facility];   Service: Endoscopy;  Laterality: N/A;; performed for scant hematochezia  . Esophagogastroduodenoscopy  02/22/2011    Procedure: ESOPHAGOGASTRODUODENOSCOPY (EGD);  Surgeon: Rogene Houston, MD;  [facility];  Service: Endoscopy;  Laterality: N/A;  . Mastectomy  2001    Left;modified radical with lymph node dissection  . Port-a-cath removal      pac in and out 2002  . Dilation and curettage of uterus    . Breast lumpectomy with needle localization and axillary sentinel lymph node bx Right 05/27/2013    Procedure: BREAST LUMPECTOMY WITH NEEDLE LOCALIZATION AND AXILLARY SENTINEL LYMPH NODE BX;  Surgeon: Pedro Earls, MD;  Location: [facility];  Service: General;  Laterality: Right;  . Breast surgery    . Eye surgery      both cataracts    Family History  Problem Relation Age of Onset  . Cancer Sister     lung  . Cancer Brother     lipoma  . Cancer Brother     kidney   Social History:  reports that she has never smoked. She has never used smokeless tobacco. She reports that she does not drink alcohol or use illicit drugs.  Allergies:  Allergies  Allergen Reactions  . Atorvastatin Other (See Comments)    Myalgias  . Hydrocodone Other (See Comments)    GI distress.    Medications Prior to Admission  Medication Sig Dispense  Refill  . ALPRAZolam (XANAX) 0.25 MG tablet 0.25 mg. TAKE 1/2 TABLET AS NEEDED    . amiodarone (PACERONE) 200 MG tablet Take 1 tablet (200 mg total) by mouth daily. 90 tablet 3  . apixaban (ELIQUIS) 2.5 MG TABS tablet Take 1 tablet (2.5 mg total) by mouth 2 (two) times daily. 180 tablet 3  . esomeprazole (NEXIUM) 20 MG capsule Take 20 mg by mouth 2 (two) times daily before a meal.    . exemestane (AROMASIN) 25 MG tablet Take 1 tablet (25 mg total) by mouth daily after breakfast. 90 tablet 0  . furosemide (LASIX) 40 MG tablet Take 1 tablet (40 mg total) by mouth as needed. 90 tablet 3  . OVER THE COUNTER MEDICATION Take 2 tablets by  mouth daily. Sun Cherrella(sp?)  Herb supplement for skin    . potassium chloride SA (K-DUR,KLOR-CON) 20 MEQ tablet Take 1 tablet (20 mEq total) by mouth daily. 90 tablet 3  . pravastatin (PRAVACHOL) 20 MG tablet Take 1 tablet (20 mg total) by mouth daily. 90 tablet 3  . verapamil (VERELAN PM) 180 MG 24 hr capsule Take 1 capsule (180 mg total) by mouth at bedtime. 90 capsule 3    No results found for this or any previous visit (from the past 48 hour(s)). No results found.  ROS  Blood pressure 147/86, pulse 65, temperature 98.4 F (36.9 C), temperature source Oral, resp. rate 18, height 5' 1.5" (1.562 m), weight 191 lb (86.637 kg), SpO2 95 %. Physical Exam  Constitutional: She appears well-developed and well-nourished.  HENT:  Mouth/Throat: Oropharynx is clear and moist.  Eyes: Conjunctivae are normal. No scleral icterus.  Neck: No thyromegaly present.  Cardiovascular: Normal rate, regular rhythm and normal heart sounds.   No murmur heard. Respiratory: Effort normal and breath sounds normal.  GI: Soft. She exhibits no distension and no mass. There is no tenderness.  Musculoskeletal: She exhibits no edema.  Lymphadenopathy:    She has no cervical adenopathy.  Neurological: She is alert.  Skin: Skin is warm.  Psychiatric:  patchy pigmentation involving skin of both legs.     Assessment/Plan Dysphagia in a patient with history of GERD and known hiatal hernia. Early satiety with regurgitation and vomiting. EGD with ED.  [name] U 06/02/2014, 7:35 AM

## 2014-06-02 NOTE — Discharge Instructions (Signed)
Resume usual medications and diet. Eat six small meals daily. No driving for 24 hours. Surgical consultation to be arranged Esophagogastroduodenoscopy Care After Refer to this sheet in the next few weeks. These instructions provide you with information on caring for yourself after your procedure. Your caregiver may also give you more specific instructions. Your treatment has been planned according to current medical practices, but problems sometimes occur. Call your caregiver if you have any problems or questions after your procedure.  HOME CARE INSTRUCTIONS  Do not eat or drink anything until the numbing medicine (local anesthetic) has worn off and your gag reflex has returned. You will know that the local anesthetic has worn off when you can swallow comfortably.  Do not drive for 12 hours after the procedure or as directed by your caregiver.  Only take medicines as directed by your caregiver. SEEK MEDICAL CARE IF:   You cannot stop coughing.  You are not urinating at all or less than usual. SEEK IMMEDIATE MEDICAL CARE IF:  You have difficulty swallowing.  You cannot eat or drink.  You have worsening throat or chest pain.  You have dizziness, lightheadedness, or you faint.  You have nausea or vomiting.  You have chills.  You have a fever.  You have severe abdominal pain.  You have black, tarry, or bloody stools. Document Released: 06/12/2012 Document Reviewed: 06/12/2012 [facility] Patient Information 2015 [place]. This information is not intended to replace advice given to you by your health care provider. Make sure you discuss any questions you have with your health care provider.

## 2014-06-02 NOTE — Op Note (Signed)
EGD PROCEDURE REPORT  PATIENT:  [name]  MR#:  [id number] Birthdate:  10-Nov-1931, 80 y.o., female Endoscopist:  Dr. Rogene Houston, MD Referred By:  Dr. Asencion Noble, MD  Procedure Date: 06/02/2014  Procedure:   EGD  Indications:  Patient is 80 year old Caucasian female with chronic GERD and known hiatal hernia who presents with intermittent solid food dysphagia postprandial regurgitation and early satiety. She had esophagogram recently revealing narrowing to distal segment large hiatal hernia.           Informed Consent:  The risks, benefits, alternatives & imponderables which include, but are not limited to, bleeding, infection, perforation, drug reaction and potential missed lesion have been reviewed.  The potential for biopsy, lesion removal, esophageal dilation, etc. have also been discussed.  Questions have been answered.  All parties agreeable.  Please see history & physical in medical record for more information.  Medications:  Demerol 25 mg IV Versed 5 mg IV Cetacaine spray topically for oropharyngeal anesthesia  Description of procedure:  The endoscope was introduced through the mouth and advanced to the second portion of the duodenum without difficulty or limitations. The mucosal surfaces were surveyed very carefully during advancement of the scope and upon withdrawal.  Findings:  Esophagus: Mucosa of the esophagus was normal. No ring or stricture noted. GEJ:  31 cm Hiatus: Exact location could not be determined. Stomach:  Proximal half of the stomach was intrathoracic difficulty encountered in passing the scope into antrum. Antral mucosa was normal. Pyloric channel was patent. Focal erythema noted close to cardia. Duodenum:  Normal bulbar and post bulbar mucosa.  Therapeutic/Diagnostic Maneuvers Performed:  None  Complications:  None  Impression: No evidence of erosive esophagitis or distal esophageal stricture as suspected based on esophagogram. Therefore esophagus  was not dilated. Large sliding hiatal hernia with organoaxial rotation and difficulty encountered in passing scope from proximal into distal half of the stomach. No evidence of pyloric stenosis or peptic ulcer disease.  Comment; Patient's hiatal hernia has increased in size since last EGD and she has developed organoaxial rotation assaulting and poor emptying from proximal to distal stomach causing her symptoms.   Recommendations:  Patient advised to eat 6 small meals daily. She will also change posterior when she has regurgitation to see if it would help. She may need surgical intervention. Office visit in 2-3 weeks.  REHMAN,NAJEEB U  06/02/2014  8:18 AM  CC: Dr. Asencion Noble, MD & Dr. Rayne Du ref. provider found

## 2014-06-03 ENCOUNTER — Encounter (HOSPITAL_COMMUNITY): Payer: Self-pay | Admitting: Internal Medicine

## 2014-06-16 ENCOUNTER — Telehealth: Payer: Self-pay | Admitting: Internal Medicine

## 2014-06-16 ENCOUNTER — Ambulatory Visit (INDEPENDENT_AMBULATORY_CARE_PROVIDER_SITE_OTHER): Payer: Medicare Other | Admitting: Internal Medicine

## 2014-06-16 ENCOUNTER — Encounter (INDEPENDENT_AMBULATORY_CARE_PROVIDER_SITE_OTHER): Payer: Self-pay | Admitting: Internal Medicine

## 2014-06-16 VITALS — BP 112/76 | HR 76 | Resp 98 | Ht 61.5 in | Wt 189.5 lb

## 2014-06-16 DIAGNOSIS — K449 Diaphragmatic hernia without obstruction or gangrene: Secondary | ICD-10-CM

## 2014-06-16 NOTE — Patient Instructions (Signed)
OV in 6 months. Six small meals  day.

## 2014-06-16 NOTE — Telephone Encounter (Signed)
Also, patient states she is changing pharmacys and needs all prescription refilled.

## 2014-06-16 NOTE — Progress Notes (Signed)
Subjective:    Patient ID: [name], female    DOB: [birth year], 80 y.o.   MRN: [id number]  HPI Here today for f/u after recent EGD for possible esophageal stricture. She tells me she is doing well. No dysphagia since her EGD. She is says she is grazing as far as her eating goes.She knows not to eat a big meal. Her appetite is good. She says she gets hungry. She is trying to following the six meals a day. No weight loss. BMs are moving normal.   06/02/2014 EGD:   Impression: No evidence of erosive esophagitis or distal esophageal stricture as suspected based on esophagogram. Therefore esophagus was not dilated. Large sliding hiatal hernia with organoaxial rotation and difficulty encountered in passing scope from proximal into distal half of the stomach. No evidence of pyloric stenosis or peptic ulcer disease.  Comment; Patient's hiatal hernia has increased in size since last EGD and she has developed organoaxial rotation assaulting and poor emptying from proximal to distal stomach causing her symptoms. Recommendations:  Patient advised to eat 6 small meals daily. She will also change posterior when she has regurgitation to see if it would help. She may need surgical intervention. Office visit in 2-3 weeks. Review of Systems Past Medical History  Diagnosis Date  . Herpes zoster   . Arteriosclerotic cardiovascular disease (ASCVD)      cath Feb '07- no obstructive dz - 20% proximal LAD only; EF 65%; possible coronary artery spasm  . Hyperlipidemia     Lipid profile in 11/2008:282, 176, 64, 201  . Polio 1946    at age 22  . Peripheral neuropathy   . Anxiety   . Hypertension     diastolic dysfunction; normal CMet and TSH in 11/2009; normal CBC in 04/2010  . GERD (gastroesophageal reflux disease)     hiatal hernia  . Rectal bleed   . Chest pain     Long-standing and atypical  . Atrial fibrillation     Paroxysmal on event recorder in 2009; regular supraventricular  tachycardia at a rate of 150 also identified on that study representing either atrial flutter or PSVT; onset of persistent AF in 03/2011  . Chronic anticoagulation 04/12/2011  . Renal insufficiency   . Carcinoma of breast 2001    Left mastectomy with positive nodes  . Malignant neoplasm of breast (female), unspecified site 05/27/2013    right breast lumpectomy with positive node  . Shortness of breath dyspnea     Past Surgical History  Procedure Laterality Date  . Cholecystectomy  1999  . Colonoscopy  02/22/2011    Procedure: COLONOSCOPY;  Surgeon: Rogene Houston, MD;  Location: [facility];  Service: Endoscopy;  Laterality: N/A;; performed for scant hematochezia  . Esophagogastroduodenoscopy  02/22/2011    Procedure: ESOPHAGOGASTRODUODENOSCOPY (EGD);  Surgeon: Rogene Houston, MD;  Location: [facility];  Service: Endoscopy;  Laterality: N/A;  . Mastectomy  2001    Left;modified radical with lymph node dissection  . Port-a-cath removal      pac in and out 2002  . Dilation and curettage of uterus    . Breast lumpectomy with needle localization and axillary sentinel lymph node bx Right 05/27/2013    Procedure: BREAST LUMPECTOMY WITH NEEDLE LOCALIZATION AND AXILLARY SENTINEL LYMPH NODE BX;  Surgeon: Pedro Earls, MD;  Location: [facility];  Service: General;  Laterality: Right;  . Breast surgery    . Eye surgery      both  cataracts  . Esophagogastroduodenoscopy N/A 06/02/2014    Procedure: ESOPHAGOGASTRODUODENOSCOPY (EGD);  Surgeon: Rogene Houston, MD;  Location: [facility];  Service: Endoscopy;  Laterality: N/A;  730  . Maloney dilation N/A 06/02/2014    Procedure: Venia Minks DILATION;  Surgeon: Rogene Houston, MD;  Location: [facility];  Service: Endoscopy;  Laterality: N/A;    Allergies  Allergen Reactions  . Atorvastatin Other (See Comments)    Myalgias  . Hydrocodone Other (See Comments)    GI distress.    Current Outpatient Prescriptions on File  Prior to Visit  Medication Sig Dispense Refill  . ALPRAZolam (XANAX) 0.25 MG tablet 0.25 mg. TAKE 1/2 TABLET AS NEEDED    . amiodarone (PACERONE) 200 MG tablet Take 1 tablet (200 mg total) by mouth daily. 90 tablet 3  . apixaban (ELIQUIS) 2.5 MG TABS tablet Take 1 tablet (2.5 mg total) by mouth 2 (two) times daily. 180 tablet 3  . esomeprazole (NEXIUM) 20 MG capsule Take 20 mg by mouth 2 (two) times daily before a meal.    . exemestane (AROMASIN) 25 MG tablet Take 1 tablet (25 mg total) by mouth daily after breakfast. 90 tablet 0  . furosemide (LASIX) 40 MG tablet Take 1 tablet (40 mg total) by mouth as needed. 90 tablet 3  . OVER THE COUNTER MEDICATION Take 2 tablets by mouth daily. Sun Cherrella(sp?)  Herb supplement for skin    . potassium chloride SA (K-DUR,KLOR-CON) 20 MEQ tablet Take 1 tablet (20 mEq total) by mouth daily. 90 tablet 3  . pravastatin (PRAVACHOL) 20 MG tablet Take 1 tablet (20 mg total) by mouth daily. 90 tablet 3  . verapamil (VERELAN PM) 180 MG 24 hr capsule Take 1 capsule (180 mg total) by mouth at bedtime. 90 capsule 3   No current facility-administered medications on file prior to visit.        Objective:   Physical Exam  Filed Vitals:   06/16/14 1135  Height: 5' 1.5" (1.562 m)  Weight: 189 lb 8 oz (85.957 kg)   Alert and oriented. Skin warm and dry. Oral mucosa is moist.   . Sclera anicteric, conjunctivae is pink. Thyroid not enlarged. No cervical lymphadenopathy. Lungs clear. Heart regular rate and rhythm.  Abdomen is soft. Bowel sounds are positive. No hepatomegaly. No abdominal masses felt. No tenderness.  No edema to lower extremities.          Assessment & Plan:  Large Hiatal hernia. There was no evidence of stricture on recent EGD. She will continue 6 small meals a day. OV in 6 months. Continue the Nexium.

## 2014-06-16 NOTE — Telephone Encounter (Signed)
PT does not need refills until January. Will use [employer] Is unclear when she is to follow up. Will forward to Dr. Lovena Le to see when f/u.

## 2014-06-19 ENCOUNTER — Ambulatory Visit (INDEPENDENT_AMBULATORY_CARE_PROVIDER_SITE_OTHER): Payer: Medicare Other | Admitting: Urology

## 2014-06-19 DIAGNOSIS — R822 Biliuria: Secondary | ICD-10-CM

## 2014-06-19 DIAGNOSIS — N3010 Interstitial cystitis (chronic) without hematuria: Secondary | ICD-10-CM

## 2014-06-24 ENCOUNTER — Encounter (INDEPENDENT_AMBULATORY_CARE_PROVIDER_SITE_OTHER): Payer: Self-pay

## 2014-06-24 ENCOUNTER — Other Ambulatory Visit: Payer: Self-pay | Admitting: *Deleted

## 2014-06-24 DIAGNOSIS — C50919 Malignant neoplasm of unspecified site of unspecified female breast: Secondary | ICD-10-CM

## 2014-06-25 ENCOUNTER — Other Ambulatory Visit: Payer: Self-pay | Admitting: *Deleted

## 2014-06-25 ENCOUNTER — Telehealth: Payer: Self-pay | Admitting: Oncology

## 2014-06-25 ENCOUNTER — Other Ambulatory Visit (HOSPITAL_BASED_OUTPATIENT_CLINIC_OR_DEPARTMENT_OTHER): Payer: Medicare Other

## 2014-06-25 ENCOUNTER — Ambulatory Visit (HOSPITAL_BASED_OUTPATIENT_CLINIC_OR_DEPARTMENT_OTHER): Payer: Medicare Other | Admitting: Oncology

## 2014-06-25 VITALS — BP 142/76 | Temp 98.2°F | Resp 18 | Ht 61.5 in | Wt 189.7 lb

## 2014-06-25 DIAGNOSIS — M899 Disorder of bone, unspecified: Secondary | ICD-10-CM

## 2014-06-25 DIAGNOSIS — C50211 Malignant neoplasm of upper-inner quadrant of right female breast: Secondary | ICD-10-CM

## 2014-06-25 DIAGNOSIS — C50919 Malignant neoplasm of unspecified site of unspecified female breast: Secondary | ICD-10-CM

## 2014-06-25 DIAGNOSIS — Z853 Personal history of malignant neoplasm of breast: Secondary | ICD-10-CM

## 2014-06-25 LAB — CBC WITH DIFFERENTIAL/PLATELET
BASO%: 0.5 % (ref 0.0–2.0)
BASOS ABS: 0 10*3/uL (ref 0.0–0.1)
EOS ABS: 0.1 10*3/uL (ref 0.0–0.5)
EOS%: 1.8 % (ref 0.0–7.0)
HCT: 40.6 % (ref 34.8–46.6)
HEMOGLOBIN: 12.7 g/dL (ref 11.6–15.9)
LYMPH%: 22.7 % (ref 14.0–49.7)
MCH: 27.5 pg (ref 25.1–34.0)
MCHC: 31.3 g/dL — ABNORMAL LOW (ref 31.5–36.0)
MCV: 87.9 fL (ref 79.5–101.0)
MONO#: 0.6 10*3/uL (ref 0.1–0.9)
MONO%: 10.4 % (ref 0.0–14.0)
NEUT%: 64.6 % (ref 38.4–76.8)
NEUTROS ABS: 4 10*3/uL (ref 1.5–6.5)
Platelets: 253 10*3/uL (ref 145–400)
RBC: 4.62 10*6/uL (ref 3.70–5.45)
RDW: 14.6 % — AB (ref 11.2–14.5)
WBC: 6.2 10*3/uL (ref 3.9–10.3)
lymph#: 1.4 10*3/uL (ref 0.9–3.3)

## 2014-06-25 LAB — COMPREHENSIVE METABOLIC PANEL (CC13)
ALBUMIN: 3.2 g/dL — AB (ref 3.5–5.0)
ALT: 25 U/L (ref 0–55)
AST: 31 U/L (ref 5–34)
Alkaline Phosphatase: 102 U/L (ref 40–150)
Anion Gap: 8 mEq/L (ref 3–11)
BUN: 14.3 mg/dL (ref 7.0–26.0)
CO2: 29 mEq/L (ref 22–29)
Calcium: 9.2 mg/dL (ref 8.4–10.4)
Chloride: 103 mEq/L (ref 98–109)
Creatinine: 1.1 mg/dL (ref 0.6–1.1)
EGFR: 46 mL/min/{1.73_m2} — AB (ref >90)
GLUCOSE: 113 mg/dL (ref 70–140)
POTASSIUM: 4.4 meq/L (ref 3.5–5.1)
Sodium: 140 mEq/L (ref 136–145)
Total Bilirubin: 0.72 mg/dL (ref 0.20–1.20)
Total Protein: 6.4 g/dL (ref 6.4–8.3)

## 2014-06-25 NOTE — Telephone Encounter (Signed)
perpof to sch pt appt-sch & gave pt copy of sch-cld Fin coaun tlkwd w/ RB and she adv pt to contact [facility] gave pt # to call for Fatima S[facility] info-pt understood

## 2014-06-25 NOTE — Progress Notes (Signed)
[place]  Telephone:(336) [phone] Fax:(336) [phone]     ID: [name] OB: 1931/11/15  MR#: [id number]  GBT#:[id number]  PCP: Asencion Noble, MD GYN:   SU: Johnathan Hausen OTHER MD: Tyler Pita, Cristopher Peru, Hildred Laser  CHIEF COMPLAINT: TREATMENT:  BREAST CANCER HISTORY: From doctor [place]'s 06/26/2013 note:  "[name] 80 year old woman who I graduated from our practice over 3 years ago in 2011. She was a 10 year survivor of an initial stage II, 6 node positive, ER PR positive, HER-2 positive, cancer of the left breast diagnosed in October 2001 treated by mastectomy followed by 6 cycles of Cytoxan Adriamycin Taxotere chemotherapy then 5 years of Arimidex hormonal therapy. Arimidex completed in June 2011.    A recent routine mammogram done  on 03/25/2013 showed clustered calcifications which were suspicious for malignancy. She underwent a diagnostic mammogram on October 3 which confirmed these findings then she underwent a needle biopsy on October 14. MRI done on October 29 showed a 2 x 1.8 x 1.3 cm lesion at the 12:00 position. She underwent a lumpectomy and sentinel lymph node dissection on 05/27/2013 by Dr. Johnathan Hausen. Final measurements of the tumor were 1.5 cm. Primarily invasive ductal. Positive vascular and lymphatic invasion. Grade 2 of 3. Estrogen and progesterone receptors both 100%. HER-2 negative. Ki-67 55%. One of 2 sentinel lymph nodes grossly positive for invasive cancer.   She has had a number of interim medical problems since she last saw me. She has developed atrial fibrillation. This was poorly controlled medically. She underwent DC cardioversion 2 years ago but unfortunately has reverted to atrial fibrillation again recently and another cardioversion procedure is planned. She was on verapamil long-acting and was recently started on amiodarone. Of note she was also started on one of the new oral anticoagulants, Pradaxa, in full doses of 150 mg  twice daily."  Her subsequent history is as detailed below  INTERVAL HISTORY:  Paulina returns today for follow-up of her estrogen receptor positive breast cancer. Since her last visit here she had a bone density study which shows  Mild osteopenia, with a T score of -1.2. She has been on exemestane for the last several months. She is generally doing "pretty good" with that. Hot flashes and vaginal dryness are not major issues for her. On the other hand she had to pay $195 for 1 month supply of the drug.  REVIEW OF SYSTEMS: [name] me she had upper endoscopy to evaluate what was felt to be an esophageal stricture but turned out to be external compression from her hiatal hernia. She was advised to eat many small meals during the day and so long as she does that she does well. If she tries to read "too much", which unfortunately is a real-time tension this season, she feels very uncomfortable. Aside from that there have been no changes in bowel or bladder habits, she is maintaining her weight, and a detailed review of systems was otherwise noncontributory  PAST MEDICAL HISTORY: Past Medical History  Diagnosis Date  . Herpes zoster   . Arteriosclerotic cardiovascular disease (ASCVD)      cath Feb '07- no obstructive dz - 20% proximal LAD only; EF 65%; possible coronary artery spasm  . Hyperlipidemia     Lipid profile in 11/2008:282, 176, 64, 201  . Polio 1946    at age 84  . Peripheral neuropathy   . Anxiety   . Hypertension     diastolic dysfunction; normal CMet and TSH in  11/2009; normal CBC in 04/2010  . GERD (gastroesophageal reflux disease)     hiatal hernia  . Rectal bleed   . Chest pain     Long-standing and atypical  . Atrial fibrillation     Paroxysmal on event recorder in 2009; regular supraventricular tachycardia at a rate of 150 also identified on that study representing either atrial flutter or PSVT; onset of persistent AF in 03/2011  . Chronic anticoagulation 04/12/2011  .  Renal insufficiency   . Carcinoma of breast 2001    Left mastectomy with positive nodes  . Malignant neoplasm of breast (female), unspecified site 05/27/2013    right breast lumpectomy with positive node  . Shortness of breath dyspnea     PAST SURGICAL HISTORY: Past Surgical History  Procedure Laterality Date  . Cholecystectomy  1999  . Colonoscopy  02/22/2011    Procedure: COLONOSCOPY;  Surgeon: Rogene Houston, MD;  [facility];  Service: Endoscopy;  Laterality: N/A;; performed for scant hematochezia  . Esophagogastroduodenoscopy  02/22/2011    Procedure: ESOPHAGOGASTRODUODENOSCOPY (EGD);  Surgeon: Rogene Houston, MD;  [facility];  Service: Endoscopy;  Laterality: N/A;  . Mastectomy  2001    Left;modified radical with lymph node dissection  . Port-a-cath removal      pac in and out 2002  . Dilation and curettage of uterus    . Breast lumpectomy with needle localization and axillary sentinel lymph node bx Right 05/27/2013    Procedure: BREAST LUMPECTOMY WITH NEEDLE LOCALIZATION AND AXILLARY SENTINEL LYMPH NODE BX;  Surgeon: Pedro Earls, MD;  Location: [facility];  Service: General;  Laterality: Right;  . Breast surgery    . Eye surgery      both cataracts  . Esophagogastroduodenoscopy N/A 06/02/2014    Procedure: ESOPHAGOGASTRODUODENOSCOPY (EGD);  Surgeon: Rogene Houston, MD;  [facility];  Service: Endoscopy;  Laterality: N/A;  730  . Maloney dilation N/A 06/02/2014    Procedure: Venia Minks DILATION;  Surgeon: Rogene Houston, MD;  [facility];  Service: Endoscopy;  Laterality: N/A;    FAMILY HISTORY Family History  Problem Relation Age of Onset  . Cancer Sister     lung  . Cancer Brother     lipoma  . Cancer Brother     kidney   the patient's parents died from noncancer related causes. The patient had 2 sisters and 2 brothers. One sister developed lung cancer and a brother non-Hodgkin's lymphoma. A brother  has also had a history of melanoma. There is no history of breast or ovarian cancer in the family  GYNECOLOGIC HISTORY:  Menarche age 24, first live birth age 35. The patient is GX P2. She went through the change of life approximately age 36, and took hormone replacement for "many years".  SOCIAL HISTORY:  The patient's 2 children are Illene Regulus, who lives in [place] and is retired from [employer], and [employer], who also lives in [place], and is in the [place]. The patient has 5 grandchildren and 4 great-grandchildren. She is a Psychologist, forensic.    ADVANCED DIRECTIVES:    HEALTH MAINTENANCE: History  Substance Use Topics  . Smoking status: Never Smoker   . Smokeless tobacco: Never Used  . Alcohol Use: No     Colonoscopy:  PAP:  Bone density:  Lipid panel:  Allergies  Allergen Reactions  . Atorvastatin Other (See Comments)    Myalgias  . Hydrocodone Other (See Comments)  GI distress.    Current Outpatient Prescriptions  Medication Sig Dispense Refill  . ALPRAZolam (XANAX) 0.25 MG tablet 0.25 mg. TAKE 1/2 TABLET AS NEEDED    . amiodarone (PACERONE) 200 MG tablet Take 1 tablet (200 mg total) by mouth daily. 90 tablet 3  . apixaban (ELIQUIS) 2.5 MG TABS tablet Take 1 tablet (2.5 mg total) by mouth 2 (two) times daily. 180 tablet 3  . esomeprazole (NEXIUM) 20 MG capsule Take 20 mg by mouth daily.     Marland Kitchen exemestane (AROMASIN) 25 MG tablet Take 1 tablet (25 mg total) by mouth daily after breakfast. 90 tablet 0  . furosemide (LASIX) 40 MG tablet Take 1 tablet (40 mg total) by mouth as needed. 90 tablet 3  . OVER THE COUNTER MEDICATION Take 2 tablets by mouth daily. Sun Cherrella(sp?)  Herb supplement for skin    . potassium chloride SA (K-DUR,KLOR-CON) 20 MEQ tablet Take 1 tablet (20 mEq total) by mouth daily. 90 tablet 3  . pravastatin (PRAVACHOL) 20 MG tablet Take 1 tablet (20 mg total) by mouth daily. 90 tablet 3  . verapamil (VERELAN PM) 180 MG 24 hr capsule  Take 1 capsule (180 mg total) by mouth at bedtime. 90 capsule 3   No current facility-administered medications for this visit.    OBJECTIVE: Older woman who appears stated age 89 Vitals:   06/25/14 1335  BP: 142/76  Temp: 98.2 F (36.8 C)  Resp: 18     Body mass index is 35.27 kg/(m^2).    ECOG FS:1 - Symptomatic but completely ambulatory Filed Weights   06/25/14 1335  Weight: 189 lb 11.2 oz (86.047 kg)    Sclerae unicteric, pupils round and equal Oropharynx clear and slightly dryh No cervical or supraclavicular adenopathy Lungs no rales or rhonchi Heart regular rate and rhythm Abd soft, obese,nontender, positive bowel sounds MSK no focal spinal tenderness, no upper extremity lymphedema Neuro: nonfocal, well oriented, anxious affect Breasts: the right breast is status post lumpectomy and radiation. There is no evidence of local recurrence. The right axilla is benign. The left breast is status post mastectomy. There is no evidence of chest wall recurrence. The left axilla is benign.   LAB RESULTS:  CMP     Component Value Date/Time   NA 140 06/25/2014 1318   NA 139 07/11/2013 0939   K 4.4 06/25/2014 1318   K 4.9 07/11/2013 0939   CL 102 07/11/2013 0939   CO2 29 06/25/2014 1318   CO2 27 07/11/2013 0939   GLUCOSE 113 06/25/2014 1318   GLUCOSE 98 07/11/2013 0939   BUN 14.3 06/25/2014 1318   BUN 15 07/11/2013 0939   CREATININE 1.1 06/25/2014 1318   CREATININE 0.95 07/11/2013 0939   CREATININE 0.91 05/22/2013 1115   CALCIUM 9.2 06/25/2014 1318   CALCIUM 9.1 07/11/2013 0939   PROT 6.4 06/25/2014 1318   PROT 6.6 03/22/2011 1429   ALBUMIN 3.2* 06/25/2014 1318   ALBUMIN 3.9 03/22/2011 1429   AST 31 06/25/2014 1318   AST 17 03/22/2011 1429   ALT 25 06/25/2014 1318   ALT 8 03/22/2011 1429   ALKPHOS 102 06/25/2014 1318   ALKPHOS 90 03/22/2011 1429   BILITOT 0.72 06/25/2014 1318   BILITOT 0.8 03/22/2011 1429   GFRNONAA 58* 05/22/2013 1115   GFRAA 68* 05/22/2013  1115    I No results found for: SPEP  Lab Results  Component Value Date   WBC 6.2 06/25/2014   NEUTROABS 4.0 06/25/2014   HGB 12.7  06/25/2014   HCT 40.6 06/25/2014   MCV 87.9 06/25/2014   PLT 253 06/25/2014      Chemistry      Component Value Date/Time   NA 140 06/25/2014 1318   NA 139 07/11/2013 0939   K 4.4 06/25/2014 1318   K 4.9 07/11/2013 0939   CL 102 07/11/2013 0939   CO2 29 06/25/2014 1318   CO2 27 07/11/2013 0939   BUN 14.3 06/25/2014 1318   BUN 15 07/11/2013 0939   CREATININE 1.1 06/25/2014 1318   CREATININE 0.95 07/11/2013 0939   CREATININE 0.91 05/22/2013 1115      Component Value Date/Time   CALCIUM 9.2 06/25/2014 1318   CALCIUM 9.1 07/11/2013 0939   ALKPHOS 102 06/25/2014 1318   ALKPHOS 90 03/22/2011 1429   AST 31 06/25/2014 1318   AST 17 03/22/2011 1429   ALT 25 06/25/2014 1318   ALT 8 03/22/2011 1429   BILITOT 0.72 06/25/2014 1318   BILITOT 0.8 03/22/2011 1429       No results found for: LABCA2  No components found for: LABCA125  No results for input(s): INR in the last 168 hours.  Urinalysis    Component Value Date/Time   COLORURINE LT. YELLOW 11/12/2008 1508   APPEARANCEUR CLEAR 11/12/2008 1508   LABSPEC >=1.030 11/12/2008 1508   PHURINE 5.0 11/12/2008 1508   GLUCOSEU NEGATIVE 11/12/2008 1508   BILIRUBINUR NEGATIVE 11/12/2008 1508   KETONESUR NEGATIVE 11/12/2008 1508   UROBILINOGEN 0.2 11/12/2008 1508   NITRITE NEGATIVE 11/12/2008 1508   LEUKOCYTESUR SMALL 11/12/2008 1508    STUDIES:  EXAM: DUAL X-RAY ABSORPTIOMETRY (DXA) FOR BONE MINERAL DENSITY  IMPRESSION: Ordering Physician: Dr. Chauncey Cruel,  Your patient [name] completed a BMD test on 05/19/2014 using the [employer] (software version: 14.10) manufactured by [employer]. The following summarizes the results of our evaluation. PATIENT BIOGRAPHICAL: Name: JACQUELIN, KRAJEWSKI Patient ID: [id number] Birth Date: 1932-01-16 Height:  51.5 in. Gender: Female Exam Date: 05/19/2014 Weight: 191.0 lbs. Indications: Caucasian, History of Fracture (Adult), Hx Breast Ca, Post Menopausal Fractures: Wrist Treatments: Aromasin, Calcium, Vitamin D DENSITOMETRY RESULTS: Site Region Measured Date Measured Age WHO Classification Young Adult T-score BMD %Change vs. Previous Significant Change (*) DualFemur Neck Left 05/19/2014 80.3 Normal -0.6 0.955 g/cm2  Left Forearm Radius 33% 05/19/2014 80.3 Osteopenia -1.2 0.625 g/cm2 ASSESSMENT: BMD as determined from Forearm Radius 33% is 0.625 g/cm2 with a T-Score of -1.2. This patient is considered osteopenic according to [facility]) criteria. (Lumbar spine was not utilized due to advanced degenerative changes.)    CLINICAL DATA: Malignant lumpectomy of the right breast in November, 2014 with adjuvant radiation therapy. Prior malignant left mastectomy in 2001 with adjuvant radiation therapy and chemotherapy. Annual evaluation.  EXAM: DIGITAL DIAGNOSTIC RIGHT MAMMOGRAM WITH CAD  COMPARISON: 05/27/2013, 04/22/2013, dating back to 11/14/2007.  ACR Breast Density Category c: The breast tissue is heterogeneously dense, which may obscure small masses.  FINDINGS: CC and MLO views of the right breast and a spot tangential view of the lumpectomy site in the upper outer right breast were obtained. Post lumpectomy scarring and post radiation skin thickening and trabecular thickening. Stable benign calcifications dating back to 2009. No findings suspicious for malignancy in the right breast.  Mammographic images were processed with CAD.  IMPRESSION: No specific mammographic evidence of malignancy, right breast. Expected post lumpectomy and post radiation changes.  RECOMMENDATION: Diagnostic right mammogram in 1 year.  I have discussed the findings  and recommendations with the patient. Results were also provided in writing at  the conclusion of the visit. If applicable, a reminder letter will be sent to the patient regarding the next appointment.  BI-RADS CATEGORY 2: Benign.   Electronically Signed  By: Evangeline Dakin M.D.  On: 04/13/2014 11:10   ASSESSMENT: 80 y.o.  woman  (1) status post left modified radical mastectomy 05/25/2000 for a pT1(mic) pN2, stage IIIA invasive ductal carcinoma, grade 3 estrogen receptor 93% positive, progesterone receptor 17% positive, with an MIB-1 of 38% and no HER-2 amplification by FISH  (2) status post adjuvant chemotherapy with docetaxel, doxorubicin and cyclophosphamide x6, followed by adjuvant radiation to the left chest wall, followed by anastrozole which was continued until may of 2011  (3) status post right lumpectomy with sentinel lymph node biopsy 05/27/2013 for a pT1c pN1, stage IIA invasive ductal carcinoma, grade 2, estrogen and progesterone receptor strongly positive, HER-2 not amplified, with an MIB-1 of 55%  (4) completed adjuvant radiation to the right breast, axilla, and right supraclavicular region 09/08/2013  (5) started exemestane 12/09/2013  (6) osteopenia, with a T score of -1.2 on bone density study October 2015  PLAN: Mykaylah is doing well on the exemestane, and though we really do not have a lot of data on continuing antiestrogen therapy after a history of 5 prior years on an aromatase inhibitor, I think it is eminently reasonable for her to continue on this drug for 5 years, to June 2020.  The only issue is cost. This drug should really be no more than $12 a month, since it is generic. We had been provided it to her through a "scholarship". I do not know if that can be extended and I am asking her to meet with our financial counselor today. I also suggested she stop at our [facility] and see if being a patient here she cannot get some kind of discount. I am sure she shops around here or in [place] she will find a cheaper  price in the one she is being quoted. Otherwise she will let us know.  We also discussed her bone density, which is favorable.  [place]eca has a good understanding of the overall plan. She agrees with it. She knows the goal of treatment in her case is cure. She will call with any problems that may develop before her next visit here, which will be in 6 months  Carlise Stofer C, MD     12/18/20151:27 PM

## 2014-07-13 ENCOUNTER — Telehealth: Payer: Self-pay | Admitting: Internal Medicine

## 2014-07-13 MED ORDER — APIXABAN 2.5 MG PO TABS: 2.5000 mg | ORAL_TABLET | Freq: Two times a day (BID) | ORAL | Status: DC

## 2014-07-13 NOTE — Telephone Encounter (Signed)
Refill to [facility] for [employer]

## 2014-07-13 NOTE — Telephone Encounter (Signed)
Wants to speak with nurse regarding needing new RX's for her meds due to switching pharmacies / tgs

## 2014-09-17 ENCOUNTER — Telehealth: Payer: Self-pay | Admitting: *Deleted

## 2014-09-17 MED ORDER — PRAVASTATIN SODIUM 20 MG PO TABS: 20.0000 mg | ORAL_TABLET | Freq: Every day | ORAL | Status: DC

## 2014-09-17 NOTE — Telephone Encounter (Signed)
Pt needs pravastatin needs called in to [facility]. Pt also needs samples of eliquis.

## 2014-09-22 ENCOUNTER — Encounter: Payer: Self-pay | Admitting: Oncology

## 2014-09-22 NOTE — Progress Notes (Signed)
I recd note from [employer] program. They are unable to enroll the patient for asst because she didn't send denial letter from alternate source to them. [id number]

## 2014-10-02 ENCOUNTER — Ambulatory Visit (INDEPENDENT_AMBULATORY_CARE_PROVIDER_SITE_OTHER): Payer: PPO | Admitting: Internal Medicine

## 2014-10-02 ENCOUNTER — Encounter: Payer: Self-pay | Admitting: Internal Medicine

## 2014-10-02 VITALS — BP 132/80 | HR 59 | Ht 61.5 in | Wt 186.8 lb

## 2014-10-02 DIAGNOSIS — Z7901 Long term (current) use of anticoagulants: Secondary | ICD-10-CM | POA: Diagnosis not present

## 2014-10-02 DIAGNOSIS — I10 Essential (primary) hypertension: Secondary | ICD-10-CM

## 2014-10-02 DIAGNOSIS — I480 Paroxysmal atrial fibrillation: Secondary | ICD-10-CM | POA: Diagnosis not present

## 2014-10-02 NOTE — Assessment & Plan Note (Signed)
Her blood pressure is slightly elevated. She is encouraged to reduce her weight and to eat less salt.

## 2014-10-02 NOTE — Assessment & Plan Note (Signed)
She appears to be maintaining NSR on low dose amiodarone. She will continue this medication.

## 2014-10-02 NOTE — Progress Notes (Signed)
HPI [name] returns today for followup. She is a very pleasant 81 year old woman with a history of paroxysmal atrial fibrillation which has become persistent. She was on flecainide for many years but eventually, the flecainide became ineffective.  She underwent mastectomy and XRT over a year ago. The patient has been under less financial stress recently.  She has occaisional palpitations.   Allergies  Allergen Reactions  . Atorvastatin Other (See Comments)    Myalgias  . Hydrocodone Other (See Comments)    GI distress.     Current Outpatient Prescriptions  Medication Sig Dispense Refill  . ALPRAZolam (XANAX) 0.25 MG tablet 0.25 mg. TAKE 1/2 TABLET AS NEEDED    . amiodarone (PACERONE) 200 MG tablet Take 1 tablet (200 mg total) by mouth daily. 90 tablet 3  . apixaban (ELIQUIS) 2.5 MG TABS tablet Take 1 tablet (2.5 mg total) by mouth 2 (two) times daily. 60 tablet 6  . esomeprazole (NEXIUM) 20 MG capsule Take 20 mg by mouth daily.     Marland Kitchen exemestane (AROMASIN) 25 MG tablet Take 1 tablet (25 mg total) by mouth daily after breakfast. 90 tablet 0  . furosemide (LASIX) 40 MG tablet Take 1 tablet (40 mg total) by mouth as needed. 90 tablet 3  . OVER THE COUNTER MEDICATION Take 2 tablets by mouth daily. Sun Cherrella(sp?)  Herb supplement for skin    . potassium chloride SA (K-DUR,KLOR-CON) 20 MEQ tablet Take 1 tablet (20 mEq total) by mouth daily. 90 tablet 3  . pravastatin (PRAVACHOL) 20 MG tablet Take 1 tablet (20 mg total) by mouth daily. 90 tablet 3  . verapamil (VERELAN PM) 180 MG 24 hr capsule Take 1 capsule (180 mg total) by mouth at bedtime. 90 capsule 3   No current facility-administered medications for this visit.     Past Medical History  Diagnosis Date  . Herpes zoster   . Arteriosclerotic cardiovascular disease (ASCVD)      cath Feb '07- no obstructive dz - 20% proximal LAD only; EF 65%; possible coronary artery spasm  . Hyperlipidemia     Lipid profile in  11/2008:282, 176, 64, 201  . Polio 1946    at age 74  . Peripheral neuropathy   . Anxiety   . Hypertension     diastolic dysfunction; normal CMet and TSH in 11/2009; normal CBC in 04/2010  . GERD (gastroesophageal reflux disease)     hiatal hernia  . Rectal bleed   . Chest pain     Long-standing and atypical  . Atrial fibrillation     Paroxysmal on event recorder in 2009; regular supraventricular tachycardia at a rate of 150 also identified on that study representing either atrial flutter or PSVT; onset of persistent AF in 03/2011  . Chronic anticoagulation 04/12/2011  . Renal insufficiency   . Carcinoma of breast 2001    Left mastectomy with positive nodes  . Malignant neoplasm of breast (female), unspecified site 05/27/2013    right breast lumpectomy with positive node  . Shortness of breath dyspnea     ROS:   All systems reviewed and negative except as noted in the HPI.   Past Surgical History  Procedure Laterality Date  . Cholecystectomy  1999  . Colonoscopy  02/22/2011    Procedure: COLONOSCOPY;  Surgeon: Rogene Houston, MD;  [facility];  Service: Endoscopy;  Laterality: N/A;; performed for scant hematochezia  . Esophagogastroduodenoscopy  02/22/2011    Procedure: ESOPHAGOGASTRODUODENOSCOPY (EGD);  Surgeon: Rogene Houston, MD;  [facility];  Service: Endoscopy;  Laterality: N/A;  . Mastectomy  2001    Left;modified radical with lymph node dissection  . Port-a-cath removal      pac in and out 2002  . Dilation and curettage of uterus    . Breast lumpectomy with needle localization and axillary sentinel lymph node bx Right 05/27/2013    Procedure: BREAST LUMPECTOMY WITH NEEDLE LOCALIZATION AND AXILLARY SENTINEL LYMPH NODE BX;  Surgeon: Pedro Earls, MD;  Location: [facility];  Service: General;  Laterality: Right;  . Breast surgery    . Eye surgery      both cataracts  . Esophagogastroduodenoscopy N/A 06/02/2014    Procedure:  ESOPHAGOGASTRODUODENOSCOPY (EGD);  Surgeon: Rogene Houston, MD;  [facility];  Service: Endoscopy;  Laterality: N/A;  730  . Maloney dilation N/A 06/02/2014    Procedure: Venia Minks DILATION;  Surgeon: Rogene Houston, MD;  [facility];  Service: Endoscopy;  Laterality: N/A;     Family History  Problem Relation Age of Onset  . Cancer Sister     lung  . Cancer Brother     lipoma  . Cancer Brother     kidney     History   Social History  . Marital Status: Widowed    Spouse Name: N/A  . Number of Children: 2  . Years of Education: N/A   Occupational History  . homemaker    Social History Main Topics  . Smoking status: Never Smoker   . Smokeless tobacco: Never Used  . Alcohol Use: No  . Drug Use: No  . Sexual Activity: Not Currently    Birth Control/ Protection: Post-menopausal   Other Topics Concern  . Not on file   Social History Narrative     BP 132/80 mmHg  Pulse 59  Ht 5' 1.5" (1.562 m)  Wt 186 lb 12.8 oz (84.732 kg)  BMI 34.73 kg/m2  SpO2 95%  Physical Exam:  Non-ill appearing 81 year old woman,NAD HEENT: Unremarkable Neck:  No JVD, no thyromegally Back:  No CVA tenderness Lungs:  Scattered wheezes and rales, no rhonchi HEART:  Regular rate rhythm, no murmurs, no rubs, no clicks Abd:  soft, obese, positive bowel sounds, no organomegally, no rebound, no guarding Ext:  2 plus pulses, trace edema, no cyanosis, no clubbing Skin:  No rashes no nodules Neuro:  CN II through XII intact, motor grossly intact   Assess/Plan:

## 2014-10-02 NOTE — Assessment & Plan Note (Signed)
She is tolerating Eliquis and is doing better at affording this drug now. Will follow.

## 2014-10-02 NOTE — Patient Instructions (Signed)
Your physician wants you to follow-up in: 1 year with Dr. Taylor. You will receive a reminder letter in the mail two months in advance. If you don't receive a letter, please call our office to schedule the follow-up appointment.  Your physician recommends that you continue on your current medications as directed. Please refer to the Current Medication list given to you today.  Thank you for choosing [facility]!   

## 2014-10-20 ENCOUNTER — Other Ambulatory Visit: Payer: Self-pay | Admitting: Internal Medicine

## 2014-11-17 ENCOUNTER — Other Ambulatory Visit: Payer: Self-pay | Admitting: Internal Medicine

## 2014-11-18 ENCOUNTER — Telehealth: Payer: Self-pay | Admitting: Internal Medicine

## 2014-11-18 MED ORDER — VERAPAMIL HCL ER 180 MG PO CP24: 180.0000 mg | ORAL_CAPSULE | Freq: Every day | ORAL | Status: DC

## 2014-11-18 NOTE — Telephone Encounter (Signed)
In need of Eliquis samples

## 2014-11-26 ENCOUNTER — Other Ambulatory Visit: Payer: Self-pay | Admitting: *Deleted

## 2014-11-26 DIAGNOSIS — C50211 Malignant neoplasm of upper-inner quadrant of right female breast: Secondary | ICD-10-CM

## 2014-11-30 ENCOUNTER — Telehealth: Payer: Self-pay | Admitting: Nurse Practitioner

## 2014-11-30 ENCOUNTER — Other Ambulatory Visit: Payer: Medicare Other

## 2014-11-30 ENCOUNTER — Ambulatory Visit: Payer: Medicare Other | Admitting: Nurse Practitioner

## 2014-11-30 ENCOUNTER — Other Ambulatory Visit: Payer: Self-pay | Admitting: Nurse Practitioner

## 2014-11-30 NOTE — Telephone Encounter (Signed)
Returned Advertising account executive. Confirmd appointment for 05/31

## 2014-12-08 ENCOUNTER — Encounter: Payer: Self-pay | Admitting: Oncology

## 2014-12-08 ENCOUNTER — Encounter: Payer: Self-pay | Admitting: Nurse Practitioner

## 2014-12-08 ENCOUNTER — Ambulatory Visit (HOSPITAL_BASED_OUTPATIENT_CLINIC_OR_DEPARTMENT_OTHER): Payer: PPO | Admitting: Nurse Practitioner

## 2014-12-08 ENCOUNTER — Other Ambulatory Visit (HOSPITAL_BASED_OUTPATIENT_CLINIC_OR_DEPARTMENT_OTHER): Payer: PPO

## 2014-12-08 ENCOUNTER — Telehealth: Payer: Self-pay | Admitting: Oncology

## 2014-12-08 DIAGNOSIS — C50911 Malignant neoplasm of unspecified site of right female breast: Secondary | ICD-10-CM

## 2014-12-08 DIAGNOSIS — Z79811 Long term (current) use of aromatase inhibitors: Secondary | ICD-10-CM

## 2014-12-08 DIAGNOSIS — Z170 Estrogen receptor positive status [ER+]: Secondary | ICD-10-CM

## 2014-12-08 DIAGNOSIS — C50912 Malignant neoplasm of unspecified site of left female breast: Secondary | ICD-10-CM

## 2014-12-08 DIAGNOSIS — C50211 Malignant neoplasm of upper-inner quadrant of right female breast: Secondary | ICD-10-CM

## 2014-12-08 DIAGNOSIS — M859 Disorder of bone density and structure, unspecified: Secondary | ICD-10-CM

## 2014-12-08 LAB — CBC WITH DIFFERENTIAL/PLATELET
BASO%: 1.4 % (ref 0.0–2.0)
BASOS ABS: 0.1 10*3/uL (ref 0.0–0.1)
EOS%: 1.2 % (ref 0.0–7.0)
Eosinophils Absolute: 0.1 10*3/uL (ref 0.0–0.5)
HCT: 41 % (ref 34.8–46.6)
HGB: 13.3 g/dL (ref 11.6–15.9)
LYMPH%: 18.6 % (ref 14.0–49.7)
MCH: 26.9 pg (ref 25.1–34.0)
MCHC: 32.3 g/dL (ref 31.5–36.0)
MCV: 83.2 fL (ref 79.5–101.0)
MONO#: 0.6 10*3/uL (ref 0.1–0.9)
MONO%: 9.2 % (ref 0.0–14.0)
NEUT#: 4.6 10*3/uL (ref 1.5–6.5)
NEUT%: 69.6 % (ref 38.4–76.8)
Platelets: 307 10*3/uL (ref 145–400)
RBC: 4.93 10*6/uL (ref 3.70–5.45)
RDW: 16.8 % — AB (ref 11.2–14.5)
WBC: 6.5 10*3/uL (ref 3.9–10.3)
lymph#: 1.2 10*3/uL (ref 0.9–3.3)

## 2014-12-08 LAB — COMPREHENSIVE METABOLIC PANEL (CC13)
ALT: 68 U/L — AB (ref 0–55)
AST: 68 U/L — AB (ref 5–34)
Albumin: 3.1 g/dL — ABNORMAL LOW (ref 3.5–5.0)
Alkaline Phosphatase: 117 U/L (ref 40–150)
Anion Gap: 10 mEq/L (ref 3–11)
BUN: 17.7 mg/dL (ref 7.0–26.0)
CHLORIDE: 104 meq/L (ref 98–109)
CO2: 28 mEq/L (ref 22–29)
CREATININE: 1 mg/dL (ref 0.6–1.1)
Calcium: 8.9 mg/dL (ref 8.4–10.4)
EGFR: 51 mL/min/{1.73_m2} — ABNORMAL LOW (ref >90)
Glucose: 102 mg/dl (ref 70–140)
POTASSIUM: 4.6 meq/L (ref 3.5–5.1)
Sodium: 142 mEq/L (ref 136–145)
TOTAL PROTEIN: 6.6 g/dL (ref 6.4–8.3)
Total Bilirubin: 0.51 mg/dL (ref 0.20–1.20)

## 2014-12-08 NOTE — Progress Notes (Signed)
I will send application to patient for possible asst with aromasin.

## 2014-12-08 NOTE — Progress Notes (Signed)
[place]  [phone] [phone]     ID: [name] OB: Nov 08, 1931  MR#: [id number]  MOL#:[id number]  PCP: Asencion Noble, MD GYN:   SU: Johnathan Hausen OTHER MD: Tyler Pita, Cristopher Peru, Hildred Laser  CHIEF COMPLAINT: TREATMENT:  BREAST CANCER HISTORY: From doctor [place]'s 06/26/2013 note:  "Pleasant 81 year old woman who I graduated from our practice over 3 years ago in 2011. She was a 10 year survivor of an initial stage II, 6 node positive, ER PR positive, HER-2 positive, cancer of the left breast diagnosed in October 2001 treated by mastectomy followed by 6 cycles of Cytoxan Adriamycin Taxotere chemotherapy then 5 years of Arimidex hormonal therapy. Arimidex completed in June 2011.    A recent routine mammogram done  on 03/25/2013 showed clustered calcifications which were suspicious for malignancy. She underwent a diagnostic mammogram on October 3 which confirmed these findings then she underwent a needle biopsy on October 14. MRI done on October 29 showed a 2 x 1.8 x 1.3 cm lesion at the 12:00 position. She underwent a lumpectomy and sentinel lymph node dissection on 05/27/2013 by Dr. Johnathan Hausen. Final measurements of the tumor were 1.5 cm. Primarily invasive ductal. Positive vascular and lymphatic invasion. Grade 2 of 3. Estrogen and progesterone receptors both 100%. HER-2 negative. Ki-67 55%. One of 2 sentinel lymph nodes grossly positive for invasive cancer.   She has had a number of interim medical problems since she last saw me. She has developed atrial fibrillation. This was poorly controlled medically. She underwent DC cardioversion 2 years ago but unfortunately has reverted to atrial fibrillation again recently and another cardioversion procedure is planned. She was on verapamil long-acting and was recently started on amiodarone. Of note she was also started on one of the new oral anticoagulants, Pradaxa, in full doses of 150 mg  twice daily."  Her subsequent history is as detailed below  INTERVAL HISTORY: [name] returns today for follow-up of her estrogen receptor positive breast cancer. She has been on exemestane since June 2015 and is tolerating this drug well. She has hot flashes on occasion, but arthralgias/myalgias and vaginal dryness are not a major issue. She does have yeast infections a few times a year. The interval history is generally unremarkable. [facility]  REVIEW OF SYSTEMS: [name] denies fevers, chills, nausea, vomiting, or changes in bowel or bladder habits. She is eating and drinking well with no pain or bloating as described in the last progress note. She denies shortness of breath, chest pain, cough, or palpitations. She generally sleeps well at night. She has a history of macular degeneration and receives injections to her eyes every 5 weeks. A detailed review of systems is otherwise stable.  PAST MEDICAL HISTORY: Past Medical History  Diagnosis Date  . Herpes zoster   . Arteriosclerotic cardiovascular disease (ASCVD)      cath Feb '07- no obstructive dz - 20% proximal LAD only; EF 65%; possible coronary artery spasm  . Hyperlipidemia     Lipid profile in 11/2008:282, 176, 64, 201  . Polio 1946    at age 11  . Peripheral neuropathy   . Anxiety   . Hypertension     diastolic dysfunction; normal CMet and TSH in 11/2009; normal CBC in 04/2010  . GERD (gastroesophageal reflux disease)     hiatal hernia  . Rectal bleed   . Chest pain     Long-standing and atypical  .  Atrial fibrillation     Paroxysmal on event recorder in 2009; regular supraventricular tachycardia at a rate of 150 also identified on that study representing either atrial flutter or PSVT; onset of persistent AF in 03/2011  . Chronic anticoagulation 04/12/2011  . Renal insufficiency   . Carcinoma of breast 2001    Left mastectomy with positive nodes  . Malignant neoplasm of breast (female),  unspecified site 05/27/2013    right breast lumpectomy with positive node  . Shortness of breath dyspnea     PAST SURGICAL HISTORY: Past Surgical History  Procedure Laterality Date  . Cholecystectomy  1999  . Colonoscopy  02/22/2011    Procedure: COLONOSCOPY;  Surgeon: Rogene Houston, MD;  [facility];  Service: Endoscopy;  Laterality: N/A;; performed for scant hematochezia  . Esophagogastroduodenoscopy  02/22/2011    Procedure: ESOPHAGOGASTRODUODENOSCOPY (EGD);  Surgeon: Rogene Houston, MD;  [facility];  Service: Endoscopy;  Laterality: N/A;  . Mastectomy  2001    Left;modified radical with lymph node dissection  . Port-a-cath removal      pac in and out 2002  . Dilation and curettage of uterus    . Breast lumpectomy with needle localization and axillary sentinel lymph node bx Right 05/27/2013    Procedure: BREAST LUMPECTOMY WITH NEEDLE LOCALIZATION AND AXILLARY SENTINEL LYMPH NODE BX;  Surgeon: Pedro Earls, MD;  Location: [facility];  Service: General;  Laterality: Right;  . Breast surgery    . Eye surgery      both cataracts  . Esophagogastroduodenoscopy N/A 06/02/2014    Procedure: ESOPHAGOGASTRODUODENOSCOPY (EGD);  Surgeon: Rogene Houston, MD;  [facility];  Service: Endoscopy;  Laterality: N/A;  730  . Maloney dilation N/A 06/02/2014    Procedure: Venia Minks DILATION;  Surgeon: Rogene Houston, MD;  [facility];  Service: Endoscopy;  Laterality: N/A;    FAMILY HISTORY Family History  Problem Relation Age of Onset  . Cancer Sister     lung  . Cancer Brother     lipoma  . Cancer Brother     kidney   the patient's parents died from noncancer related causes. The patient had 2 sisters and 2 brothers. One sister developed lung cancer and a brother non-Hodgkin's lymphoma. A brother has also had a history of melanoma. There is no history of breast or ovarian cancer in the family  GYNECOLOGIC HISTORY:  Menarche  age 36, first live birth age 69. The patient is GX P2. She went through the change of life approximately age 5, and took hormone replacement for "many years".  SOCIAL HISTORY:  The patient's 2 children are Illene Regulus, who lives in [place] and is retired from [employer], and [employer]nicom, who also lives in [place], and is in the [place]. The patient has 5 grandchildren and 4 great-grandchildren. She is a Psychologist, forensic.    ADVANCED DIRECTIVES:    HEALTH MAINTENANCE: History  Substance Use Topics  . Smoking status: Never Smoker   . Smokeless tobacco: Never Used  . Alcohol Use: No     Colonoscopy:  PAP:  Bone density:  Lipid panel:  Allergies  Allergen Reactions  . Atorvastatin Other (See Comments)    Myalgias  . Hydrocodone Other (See Comments)    GI distress.    Current Outpatient Prescriptions  Medication Sig Dispense Refill  . ALPRAZolam (XANAX) 0.25 MG tablet 0.25 mg. TAKE 1/2 TABLET AS NEEDED    . amiodarone (PACERONE)  200 MG tablet TAKE (1) TABLET BY MOUTH TWICE DAILY FOR HEART. 180 tablet 3  . apixaban (ELIQUIS) 2.5 MG TABS tablet Take 1 tablet (2.5 mg total) by mouth 2 (two) times daily. 60 tablet 6  . esomeprazole (NEXIUM) 20 MG capsule Take 20 mg by mouth daily.     Marland Kitchen exemestane (AROMASIN) 25 MG tablet Take 1 tablet (25 mg total) by mouth daily after breakfast. 90 tablet 0  . furosemide (LASIX) 40 MG tablet Take 1 tablet (40 mg total) by mouth as needed. (Patient taking differently: Take 40 mg by mouth as needed. Pt takes 1/2 tablet as needed for tightness in chest.) 90 tablet 3  . potassium chloride SA (K-DUR,KLOR-CON) 20 MEQ tablet Take 1 tablet (20 mEq total) by mouth daily. 90 tablet 3  . pravastatin (PRAVACHOL) 20 MG tablet Take 1 tablet (20 mg total) by mouth daily. 90 tablet 3  . verapamil (VERELAN PM) 180 MG 24 hr capsule Take 1 capsule (180 mg total) by mouth at bedtime. 90 capsule 3  . OVER THE COUNTER MEDICATION Take 2 tablets by mouth daily.  Sun Cherrella(sp?)  Herb supplement for skin     No current facility-administered medications for this visit.    OBJECTIVE: Older woman who appears stated age 28 Vitals:   12/08/14 1511  BP: 139/74  Pulse: 64  Temp: 97.7 F (36.5 C)  Resp: 18     Body mass index is 33.85 kg/(m^2).    ECOG FS:1 - Symptomatic but completely ambulatory Filed Weights   12/08/14 1511  Weight: 182 lb 1.6 oz (82.6 kg)    Skin: warm, dry  HEENT: sclerae anicteric, conjunctivae pink, oropharynx clear. No thrush or mucositis.  Lymph Nodes: No cervical or supraclavicular lymphadenopathy  Lungs: clear to auscultation bilaterally, no rales, wheezes, or rhonci  Heart: regular rate and rhythm  Abdomen: round, soft, non tender, positive bowel sounds  Musculoskeletal: No focal spinal tenderness, no peripheral edema  Neuro: non focal, well oriented, positive affect  Breast: left breast status post mastectomy with no evidence of chest wall recurrence. Right breast status post lumpectomy and radiation. No evidence of recurrent disease. Bilateral axilla benign.    LAB RESULTS:  CMP     Component Value Date/Time   NA 142 12/08/2014 1455   NA 139 07/11/2013 0939   K 4.6 12/08/2014 1455   K 4.9 07/11/2013 0939   CL 102 07/11/2013 0939   CO2 28 12/08/2014 1455   CO2 27 07/11/2013 0939   GLUCOSE 102 12/08/2014 1455   GLUCOSE 98 07/11/2013 0939   BUN 17.7 12/08/2014 1455   BUN 15 07/11/2013 0939   CREATININE 1.0 12/08/2014 1455   CREATININE 0.95 07/11/2013 0939   CREATININE 0.91 05/22/2013 1115   CALCIUM 8.9 12/08/2014 1455   CALCIUM 9.1 07/11/2013 0939   PROT 6.6 12/08/2014 1455   PROT 6.6 03/22/2011 1429   ALBUMIN 3.1* 12/08/2014 1455   ALBUMIN 3.9 03/22/2011 1429   AST 68* 12/08/2014 1455   AST 17 03/22/2011 1429   ALT 68* 12/08/2014 1455   ALT 8 03/22/2011 1429   ALKPHOS 117 12/08/2014 1455   ALKPHOS 90 03/22/2011 1429   BILITOT 0.51 12/08/2014 1455   BILITOT 0.8 03/22/2011 1429   GFRNONAA  58* 05/22/2013 1115   GFRAA 68* 05/22/2013 1115    I No results found for: SPEP  Lab Results  Component Value Date   WBC 6.5 12/08/2014   NEUTROABS 4.6 12/08/2014   HGB 13.3 12/08/2014  HCT 41.0 12/08/2014   MCV 83.2 12/08/2014   PLT 307 12/08/2014      Chemistry      Component Value Date/Time   NA 142 12/08/2014 1455   NA 139 07/11/2013 0939   K 4.6 12/08/2014 1455   K 4.9 07/11/2013 0939   CL 102 07/11/2013 0939   CO2 28 12/08/2014 1455   CO2 27 07/11/2013 0939   BUN 17.7 12/08/2014 1455   BUN 15 07/11/2013 0939   CREATININE 1.0 12/08/2014 1455   CREATININE 0.95 07/11/2013 0939   CREATININE 0.91 05/22/2013 1115      Component Value Date/Time   CALCIUM 8.9 12/08/2014 1455   CALCIUM 9.1 07/11/2013 0939   ALKPHOS 117 12/08/2014 1455   ALKPHOS 90 03/22/2011 1429   AST 68* 12/08/2014 1455   AST 17 03/22/2011 1429   ALT 68* 12/08/2014 1455   ALT 8 03/22/2011 1429   BILITOT 0.51 12/08/2014 1455   BILITOT 0.8 03/22/2011 1429       No results found for: LABCA2  No components found for: LABCA125  No results for input(s): INR in the last 168 hours.  Urinalysis    Component Value Date/Time   COLORURINE LT. YELLOW 11/12/2008 1508   APPEARANCEUR CLEAR 11/12/2008 1508   LABSPEC >=1.030 11/12/2008 1508   PHURINE 5.0 11/12/2008 1508   GLUCOSEU NEGATIVE 11/12/2008 1508   BILIRUBINUR NEGATIVE 11/12/2008 1508   KETONESUR NEGATIVE 11/12/2008 1508   UROBILINOGEN 0.2 11/12/2008 1508   NITRITE NEGATIVE 11/12/2008 1508   LEUKOCYTESUR SMALL 11/12/2008 1508    STUDIES:  EXAM: DUAL X-RAY ABSORPTIOMETRY (DXA) FOR BONE MINERAL DENSITY  IMPRESSION: Ordering Physician: Dr. Chauncey Cruel,  Your patient [name] completed a BMD test on 05/19/2014 using the [place] (software version: 14.10) manufactured by [employer]. The following summarizes the results of our evaluation. PATIENT BIOGRAPHICAL: Name: [name], [name] Patient  ID: [id number] Birth Date: December 28, 1931 Height: 51.5 in. Gender: Female Exam Date: 05/19/2014 Weight: 191.0 lbs. Indications: Caucasian, History of Fracture (Adult), Hx Breast Ca, Post Menopausal Fractures: Wrist Treatments: Aromasin, Calcium, Vitamin D DENSITOMETRY RESULTS: Site Region Measured Date Measured Age WHO Classification Young Adult T-score BMD %Change vs. Previous Significant Change (*) DualFemur Neck Left 05/19/2014 80.3 Normal -0.6 0.955 g/cm2  Left Forearm Radius 33% 05/19/2014 80.3 Osteopenia -1.2 0.625 g/cm2 ASSESSMENT: BMD as determined from Forearm Radius 33% is 0.625 g/cm2 with a T-Score of -1.2. This patient is considered osteopenic according to [facility]) criteria. (Lumbar spine was not utilized due to advanced degenerative changes.)    CLINICAL DATA: Malignant lumpectomy of the right breast in November, 2014 with adjuvant radiation therapy. Prior malignant left mastectomy in 2001 with adjuvant radiation therapy and chemotherapy. Annual evaluation.  EXAM: DIGITAL DIAGNOSTIC RIGHT MAMMOGRAM WITH CAD  COMPARISON: 05/27/2013, 04/22/2013, dating back to 11/14/2007.  ACR Breast Density Category c: The breast tissue is heterogeneously dense, which may obscure small masses.  FINDINGS: CC and MLO views of the right breast and a spot tangential view of the lumpectomy site in the upper outer right breast were obtained. Post lumpectomy scarring and post radiation skin thickening and trabecular thickening. Stable benign calcifications dating back to 2009. No findings suspicious for malignancy in the right breast.  Mammographic images were processed with CAD.  IMPRESSION: No specific mammographic evidence of malignancy, right breast. Expected post lumpectomy and post radiation changes.  RECOMMENDATION: Diagnostic right mammogram in 1 year.  I have discussed the findings and recommendations with  the  patient. Results were also provided in writing at the conclusion of the visit. If applicable, a reminder letter will be sent to the patient regarding the next appointment.  BI-RADS CATEGORY 2: Benign.   Electronically Signed  By: Evangeline Dakin M.D.  On: 04/13/2014 11:10   ASSESSMENT: 81 y.o. [place] woman  (1) status post left modified radical mastectomy 05/25/2000 for a pT1(mic) pN2, stage IIIA invasive ductal carcinoma, grade 3 estrogen receptor 93% positive, progesterone receptor 17% positive, with an MIB-1 of 38% and no HER-2 amplification by FISH  (2) status post adjuvant chemotherapy with docetaxel, doxorubicin and cyclophosphamide x6, followed by adjuvant radiation to the left chest wall, followed by anastrozole which was continued until may of 2011  (3) status post right lumpectomy with sentinel lymph node biopsy 05/27/2013 for a pT1c pN1, stage IIA invasive ductal carcinoma, grade 2, estrogen and progesterone receptor strongly positive, HER-2 not amplified, with an MIB-1 of 55%  (4) completed adjuvant radiation to the right breast, axilla, and right supraclavicular region 09/08/2013  (5) started exemestane 12/09/2013  (6) osteopenia, with a T score of -1.2 on bone density study October 2015  PLAN: [name] is doing well as far as her breast cancer is concerned. She is now 1.5 years out from her definitive surgery with no evidence of recurrent disease. She is tolerating the exemestane well and will continue this drug for 5 years of antiestrogen therapy.   [name] will return in 6 months for labs and a follow up visit with Dr. Jana Hakim. Her next mammogram will be due in October, prior to this visit. She understands and agrees with this plan. She knows the goal of treatment in her case is cure. She has been encouraged to call with any issues that might arise before her next visit here.  Laurie Panda, NP    5/31/20164:06 PM

## 2014-12-08 NOTE — Telephone Encounter (Signed)
Appointments made and avs printed for patient °

## 2014-12-08 NOTE — Progress Notes (Signed)
I placed application for possible asst on desk of nurse for dr. Jana Hakim-- Aromasin. I will send to the patient for her portion

## 2014-12-09 NOTE — Addendum Note (Signed)
Addended by: Marcelino Duster on: 12/09/2014 08:50 AM   Modules accepted: Orders

## 2014-12-10 ENCOUNTER — Encounter: Payer: Self-pay | Admitting: Oncology

## 2014-12-10 NOTE — Progress Notes (Signed)
I sent application for asst to patient for aromasin for her to complete and send to see if she can get asst.

## 2014-12-18 ENCOUNTER — Ambulatory Visit: Payer: PPO | Admitting: Urology

## 2014-12-22 ENCOUNTER — Encounter: Payer: Self-pay | Admitting: Oncology

## 2014-12-22 NOTE — Progress Notes (Signed)
I faxed app for [employer] pat asst to  [phone]

## 2014-12-28 ENCOUNTER — Ambulatory Visit (INDEPENDENT_AMBULATORY_CARE_PROVIDER_SITE_OTHER): Payer: PPO | Admitting: Internal Medicine

## 2014-12-28 ENCOUNTER — Encounter (INDEPENDENT_AMBULATORY_CARE_PROVIDER_SITE_OTHER): Payer: Self-pay | Admitting: Internal Medicine

## 2014-12-28 VITALS — BP 140/82 | HR 76 | Temp 97.5°F | Resp 18 | Ht 61.5 in | Wt 183.1 lb

## 2014-12-28 DIAGNOSIS — K219 Gastro-esophageal reflux disease without esophagitis: Secondary | ICD-10-CM

## 2014-12-28 DIAGNOSIS — K449 Diaphragmatic hernia without obstruction or gangrene: Secondary | ICD-10-CM | POA: Diagnosis not present

## 2014-12-28 MED ORDER — PANTOPRAZOLE SODIUM 40 MG PO TBEC: 40.0000 mg | DELAYED_RELEASE_TABLET | Freq: Every day | ORAL | Status: DC

## 2014-12-28 NOTE — Progress Notes (Signed)
Presenting complaint;  Follow-up for chronic GERD.  Database;  Patient is 81 year old Caucasian female who has several year history of GERD and known hiatal hernia. She was evaluated in November 2015 for intermittent solid food dysphagia postprandial regurgitation and early satiety. Area study suggested narrowing to distal esophagus. She had EGD on  06/02/2014 revealing normal appearing esophageal mucosa without stricture formation. She had large sliding hiatal hernia with organoaxial rotation normal examination stomach first and second part of the duodenum.  patient was continued on her PPI and advised to eat multiple small meals. When seen on 06/16/2014  By Ms. Setzer, NP she was doing much better.   Subjective;   Patient states she does well as long as she eats multiple small meals. When she watches her diet she has no heartburn nausea vomiting or regurgitation chest or abdominal pain. However if she forgets or cannot control herself  Needs a regular meal she develops frequent burping bilateral shoulder pain and palpitations. She denies melena or rectal bleeding. She has good appetite. She exercises regularly and has lost 6 pounds since her last visit.  Her bowels move every morning.  She has been diagnosed with macular degeneration she states she had second injection treatment recently and her physician told her there was significant improvement after the first dose. She would like to try another PPI which would cost less.   Current Medications: Outpatient Encounter Prescriptions as of 12/28/2014  Medication Sig  . ALPRAZolam (XANAX) 0.25 MG tablet 0.25 mg. TAKE 1/2 TABLET AS NEEDED  . amiodarone (PACERONE) 200 MG tablet TAKE (1) TABLET BY MOUTH TWICE DAILY FOR HEART.  Marland Kitchen apixaban (ELIQUIS) 2.5 MG TABS tablet Take 1 tablet (2.5 mg total) by mouth 2 (two) times daily.  Marland Kitchen esomeprazole (NEXIUM) 20 MG capsule Take 20 mg by mouth daily.   Marland Kitchen exemestane (AROMASIN) 25 MG tablet Take 1 tablet (25 mg  total) by mouth daily after breakfast.  . furosemide (LASIX) 40 MG tablet Take 1 tablet (40 mg total) by mouth as needed. (Patient taking differently: Take 40 mg by mouth as needed. Pt takes 1/2 tablet as needed for tightness in chest.)  . potassium chloride SA (K-DUR,KLOR-CON) 20 MEQ tablet Take 1 tablet (20 mEq total) by mouth daily.  . pravastatin (PRAVACHOL) 20 MG tablet Take 1 tablet (20 mg total) by mouth daily.  . verapamil (VERELAN PM) 180 MG 24 hr capsule Take 1 capsule (180 mg total) by mouth at bedtime.  . [DISCONTINUED] OVER THE COUNTER MEDICATION Take 2 tablets by mouth daily. Sun Cherrella(sp?)  Herb supplement for skin   No facility-administered encounter medications on file as of 12/28/2014.     Objective: Blood pressure 140/82, pulse 76, temperature 97.5 F (36.4 C), temperature source Oral, resp. rate 18, height 5' 1.5" (1.562 m), weight 183 lb 1.6 oz (83.054 kg). Patient is alert and in no acute distress. Conjunctiva is pink. Sclera is nonicteric Oropharyngeal mucosa is normal. No neck masses or thyromegaly noted. Cardiac exam with regular rhythm  Split S1 and  Normal S2. No murmur or gallop noted. Lungs are clear to auscultation. Abdomen is full but soft and nontender without organomegaly or masses. No LE edema or clubbing noted.   Assessment:  #1. GERD. Typical symptoms are well controlled with therapy. Will change PPI for cost reasons. #2.  Large sliding hiatal hernia. She is doing well with multiple small meals. Continue to monitor closely.   Plan:  Discontinue Nexium. Pantoprazole 40 mg by mouth every morning. Patient  advised to call if she has  Chest or abdominal pain  or vomiting.  Office visit in 6 months.

## 2014-12-28 NOTE — Patient Instructions (Signed)
Eat 6 small meals daily. Call if pantoprazole co-pay is too high.

## 2014-12-30 ENCOUNTER — Encounter: Payer: Self-pay | Admitting: Oncology

## 2014-12-30 NOTE — Progress Notes (Signed)
I sent message to Val/Darlena per [employer] pathways they need info from dr/nurse(they could not take from me) for this patient's asst with Aromasin. I gave ph# to val to call them.

## 2014-12-31 ENCOUNTER — Encounter: Payer: Self-pay | Admitting: Oncology

## 2014-12-31 ENCOUNTER — Other Ambulatory Visit: Payer: Self-pay | Admitting: *Deleted

## 2014-12-31 MED ORDER — AMIODARONE HCL 200 MG PO TABS: 200.0000 mg | ORAL_TABLET | Freq: Every day | ORAL | Status: DC

## 2014-12-31 NOTE — Progress Notes (Signed)
Per April at [employer] left message, they have not heard from dr/nurse  [phone]. See prev notes mess sent to Val/Darlena on yesterday.

## 2015-01-28 ENCOUNTER — Telehealth: Payer: Self-pay | Admitting: *Deleted

## 2015-01-28 ENCOUNTER — Other Ambulatory Visit: Payer: Self-pay | Admitting: *Deleted

## 2015-01-28 NOTE — Telephone Encounter (Signed)
This RN returned call to pt per her VM regarding " a question about my medication "  Obtained VM- message left to return call to this RN.

## 2015-01-29 ENCOUNTER — Other Ambulatory Visit: Payer: Self-pay | Admitting: *Deleted

## 2015-01-29 DIAGNOSIS — C50912 Malignant neoplasm of unspecified site of left female breast: Secondary | ICD-10-CM

## 2015-01-29 DIAGNOSIS — C50911 Malignant neoplasm of unspecified site of right female breast: Secondary | ICD-10-CM

## 2015-01-29 MED ORDER — EXEMESTANE 25 MG PO TABS: 25.0000 mg | ORAL_TABLET | Freq: Every day | ORAL | Status: DC

## 2015-02-09 ENCOUNTER — Telehealth: Payer: Self-pay | Admitting: *Deleted

## 2015-02-09 NOTE — Telephone Encounter (Signed)
RETURN CALL TO VICKIE WITH [employer]. ELECTRONIC SIGNATURE WAS APPROVED AND SHIPMENT TO GO TO PT,'S HOUSE.

## 2015-02-10 ENCOUNTER — Encounter: Payer: Self-pay | Admitting: Oncology

## 2015-02-10 NOTE — Progress Notes (Signed)
Per [employer], the patient has been approved for asst thru 07/10/15 for Aromasin 25mg  tablets. I will send to medical records.

## 2015-02-23 ENCOUNTER — Telehealth: Payer: Self-pay

## 2015-02-23 NOTE — Telephone Encounter (Signed)
Pt approved for free Eliquis thru 07/10/15 via fax sent 8/15 to our office.  Await shipment to our office

## 2015-02-25 ENCOUNTER — Telehealth: Payer: Self-pay

## 2015-02-25 NOTE — Telephone Encounter (Signed)
Pt called,informed her 90 day supply eliquis is here at the [place] office.

## 2015-03-05 ENCOUNTER — Ambulatory Visit (INDEPENDENT_AMBULATORY_CARE_PROVIDER_SITE_OTHER): Payer: PPO | Admitting: Urology

## 2015-03-05 DIAGNOSIS — N3010 Interstitial cystitis (chronic) without hematuria: Secondary | ICD-10-CM

## 2015-03-05 DIAGNOSIS — N393 Stress incontinence (female) (male): Secondary | ICD-10-CM

## 2015-03-30 ENCOUNTER — Encounter: Payer: Self-pay | Admitting: Oncology

## 2015-03-30 NOTE — Progress Notes (Signed)
Patient said she was called and said her PAN asst had expired?? I emailed Lenise.

## 2015-03-30 NOTE — Progress Notes (Signed)
Raquel has been helping pt get assistance for Aromasin.  See Raquel's note on 02/10/15.

## 2015-03-31 ENCOUNTER — Encounter: Payer: Self-pay | Admitting: Oncology

## 2015-03-31 NOTE — Progress Notes (Signed)
I left message for pamela at [employer] to confirm if grant has expired. See prev notes. She has asst with [employer] now.

## 2015-04-06 ENCOUNTER — Encounter: Payer: Self-pay | Admitting: Oncology

## 2015-04-06 NOTE — Progress Notes (Signed)
Per Olin Hauser the patient still has funds that are good thru 03/02/16 or when used up. She is going to fax me a form for her-- Aromasin asst

## 2015-04-07 ENCOUNTER — Other Ambulatory Visit: Payer: Self-pay | Admitting: Oncology

## 2015-04-07 DIAGNOSIS — C50912 Malignant neoplasm of unspecified site of left female breast: Secondary | ICD-10-CM

## 2015-04-20 ENCOUNTER — Ambulatory Visit (HOSPITAL_COMMUNITY)
Admission: RE | Admit: 2015-04-20 | Discharge: 2015-04-20 | Disposition: A | Discharge status: Home / Self Care | Payer: PPO | Source: Ambulatory Visit | Attending: Oncology | Admitting: Oncology

## 2015-04-20 DIAGNOSIS — C50912 Malignant neoplasm of unspecified site of left female breast: Secondary | ICD-10-CM

## 2015-04-20 DIAGNOSIS — Z853 Personal history of malignant neoplasm of breast: Secondary | ICD-10-CM | POA: Diagnosis present

## 2015-04-20 DIAGNOSIS — Z9012 Acquired absence of left breast and nipple: Secondary | ICD-10-CM | POA: Insufficient documentation

## 2015-05-05 ENCOUNTER — Encounter: Payer: Self-pay | Admitting: Oncology

## 2015-05-05 NOTE — Progress Notes (Signed)
I sent copy of last letter from [employer] about her asst with Aromasin. She left a message dr. Should have recd a form from them(renewal). Nothing as of today, just when last shipment went to her. Expires 07/10/15

## 2015-05-26 ENCOUNTER — Other Ambulatory Visit: Payer: Self-pay

## 2015-05-26 MED ORDER — APIXABAN 2.5 MG PO TABS
2.5000 mg | ORAL_TABLET | Freq: Two times a day (BID) | ORAL | Status: DC
Start: 2015-05-26 — End: 2015-10-07

## 2015-05-26 MED ORDER — APIXABAN 2.5 MG PO TABS: 2.5000 mg | ORAL_TABLET | Freq: Two times a day (BID) | ORAL | Status: DC

## 2015-06-07 ENCOUNTER — Other Ambulatory Visit (HOSPITAL_BASED_OUTPATIENT_CLINIC_OR_DEPARTMENT_OTHER): Payer: PPO

## 2015-06-07 ENCOUNTER — Ambulatory Visit (HOSPITAL_BASED_OUTPATIENT_CLINIC_OR_DEPARTMENT_OTHER): Payer: PPO | Admitting: Oncology

## 2015-06-07 VITALS — BP 144/76 | HR 63 | Temp 98.0°F | Resp 18 | Ht 61.5 in | Wt 187.4 lb

## 2015-06-07 DIAGNOSIS — C50912 Malignant neoplasm of unspecified site of left female breast: Secondary | ICD-10-CM

## 2015-06-07 DIAGNOSIS — C50911 Malignant neoplasm of unspecified site of right female breast: Secondary | ICD-10-CM | POA: Diagnosis not present

## 2015-06-07 DIAGNOSIS — Z79811 Long term (current) use of aromatase inhibitors: Secondary | ICD-10-CM | POA: Diagnosis not present

## 2015-06-07 DIAGNOSIS — M899 Disorder of bone, unspecified: Secondary | ICD-10-CM | POA: Diagnosis not present

## 2015-06-07 DIAGNOSIS — C50211 Malignant neoplasm of upper-inner quadrant of right female breast: Secondary | ICD-10-CM

## 2015-06-07 LAB — COMPREHENSIVE METABOLIC PANEL (CC13)
ALT: 20 U/L (ref 0–55)
AST: 24 U/L (ref 5–34)
Albumin: 3.3 g/dL — ABNORMAL LOW (ref 3.5–5.0)
Alkaline Phosphatase: 136 U/L (ref 40–150)
Anion Gap: 8 mEq/L (ref 3–11)
BUN: 16.6 mg/dL (ref 7.0–26.0)
CHLORIDE: 103 meq/L (ref 98–109)
CO2: 29 meq/L (ref 22–29)
Calcium: 9.8 mg/dL (ref 8.4–10.4)
Creatinine: 0.9 mg/dL (ref 0.6–1.1)
EGFR: 57 mL/min/{1.73_m2} — AB (ref >90)
Glucose: 99 mg/dl (ref 70–140)
Potassium: 4.8 mEq/L (ref 3.5–5.1)
SODIUM: 140 meq/L (ref 136–145)
Total Bilirubin: 0.61 mg/dL (ref 0.20–1.20)
Total Protein: 7.1 g/dL (ref 6.4–8.3)

## 2015-06-07 LAB — CBC WITH DIFFERENTIAL/PLATELET
BASO%: 1.2 % (ref 0.0–2.0)
BASOS ABS: 0.1 10*3/uL (ref 0.0–0.1)
EOS ABS: 0.1 10*3/uL (ref 0.0–0.5)
EOS%: 1.5 % (ref 0.0–7.0)
HCT: 42.7 % (ref 34.8–46.6)
HEMOGLOBIN: 13.7 g/dL (ref 11.6–15.9)
LYMPH#: 1.4 10*3/uL (ref 0.9–3.3)
LYMPH%: 22 % (ref 14.0–49.7)
MCH: 28 pg (ref 25.1–34.0)
MCHC: 32.1 g/dL (ref 31.5–36.0)
MCV: 87.2 fL (ref 79.5–101.0)
MONO#: 0.8 10*3/uL (ref 0.1–0.9)
MONO%: 12.6 % (ref 0.0–14.0)
NEUT#: 4.1 10*3/uL (ref 1.5–6.5)
NEUT%: 62.7 % (ref 38.4–76.8)
PLATELETS: 271 10*3/uL (ref 145–400)
RBC: 4.9 10*6/uL (ref 3.70–5.45)
RDW: 15.8 % — AB (ref 11.2–14.5)
WBC: 6.5 10*3/uL (ref 3.9–10.3)

## 2015-06-07 NOTE — Progress Notes (Signed)
[place]  [phone] [phone]     ID: [name] OB: 11-Feb-1932  MR#: [id number]  MEQ#:[id number]  PCP: Asencion Noble, MD GYN:   SU: Johnathan Hausen OTHER MD: Tyler Pita, Cristopher Peru, Hildred Laser  CHIEF COMPLAINT: Estrogen receptor positive breast cancer  CURRENT TREATMENT:  BREAST CANCER HISTORY: From doctor [place]'s 06/26/2013 note:  "Pleasant 81 year old woman who I graduated from our practice over 3 years ago in 2011. She was a 10 year survivor of an initial stage II, 6 node positive, ER PR positive, HER-2 positive, cancer of the left breast diagnosed in October 2001 treated by mastectomy followed by 6 cycles of Cytoxan Adriamycin Taxotere chemotherapy then 5 years of Arimidex hormonal therapy. Arimidex completed in June 2011.    A recent routine mammogram done  on 03/25/2013 showed clustered calcifications which were suspicious for malignancy. She underwent a diagnostic mammogram on October 3 which confirmed these findings then she underwent a needle biopsy on October 14. MRI done on October 29 showed a 2 x 1.8 x 1.3 cm lesion at the 12:00 position. She underwent a lumpectomy and sentinel lymph node dissection on 05/27/2013 by Dr. Johnathan Hausen. Final measurements of the tumor were 1.5 cm. Primarily invasive ductal. Positive vascular and lymphatic invasion. Grade 2 of 3. Estrogen and progesterone receptors both 100%. HER-2 negative. Ki-67 55%. One of 2 sentinel lymph nodes grossly positive for invasive cancer.   She has had a number of interim medical problems since she last saw me. She has developed atrial fibrillation. This was poorly controlled medically. She underwent DC cardioversion 2 years ago but unfortunately has reverted to atrial fibrillation again recently and another cardioversion procedure is planned. She was on verapamil long-acting and was recently started on amiodarone. Of note she was also started on one of the new  oral anticoagulants, Pradaxa, in full doses of 150 mg twice daily."  Her subsequent history is as detailed below  INTERVAL HISTORY: Ineze returns today for follow-up of her breast cancer. She continues on exemestane, with very good tolerance. Hot flashes and vaginal dryness are not major issues for her. She does not have the arthralgias and myalgias that patients may have on this drug. However on a while right now she is obtaining it for free, she tells me it will soon be $100 or more and month if we continue on it.  REVIEW OF SYSTEMS: Jermani  lives by herself (her husband died 4 years ago) in an apartment now. She drives, does some house work, and some cooking. Thanksgiving she spent with relatives in [place]. Her macular degeneration is being treated. She has a sensation of itching over the left scapular area laterally. A detailed review of systems today was otherwise stable  PAST MEDICAL HISTORY: Past Medical History  Diagnosis Date  . Herpes zoster   . Arteriosclerotic cardiovascular disease (ASCVD)      cath Feb '07- no obstructive dz - 20% proximal LAD only; EF 65%; possible coronary artery spasm  . Hyperlipidemia     Lipid profile in 11/2008:282, 176, 64, 201  . Polio 1946    at age 60  . Peripheral neuropathy   . Anxiety   . Hypertension     diastolic dysfunction; normal CMet and TSH in 11/2009; normal CBC in 04/2010  . GERD (gastroesophageal reflux disease)     hiatal hernia  . Rectal bleed   . Chest pain     Long-standing and atypical  . Atrial fibrillation  Paroxysmal on event recorder in 2009; regular supraventricular tachycardia at a rate of 150 also identified on that study representing either atrial flutter or PSVT; onset of persistent AF in 03/2011  . Chronic anticoagulation 04/12/2011  . Renal insufficiency   . Carcinoma of breast 2001    Left mastectomy with positive nodes  . Malignant neoplasm of breast (female), unspecified site 05/27/2013    right breast  lumpectomy with positive node  . Shortness of breath dyspnea     PAST SURGICAL HISTORY: Past Surgical History  Procedure Laterality Date  . Cholecystectomy  1999  . Colonoscopy  02/22/2011    Procedure: COLONOSCOPY;  Surgeon: Rogene Houston, MD;  [facility];  Service: Endoscopy;  Laterality: N/A;; performed for scant hematochezia  . Esophagogastroduodenoscopy  02/22/2011    Procedure: ESOPHAGOGASTRODUODENOSCOPY (EGD);  Surgeon: Rogene Houston, MD;  [facility];  Service: Endoscopy;  Laterality: N/A;  . Mastectomy  2001    Left;modified radical with lymph node dissection  . Port-a-cath removal      pac in and out 2002  . Dilation and curettage of uterus    . Breast lumpectomy with needle localization and axillary sentinel lymph node bx Right 05/27/2013    Procedure: BREAST LUMPECTOMY WITH NEEDLE LOCALIZATION AND AXILLARY SENTINEL LYMPH NODE BX;  Surgeon: Pedro Earls, MD;  Location: [facility];  Service: General;  Laterality: Right;  . Breast surgery    . Eye surgery      both cataracts  . Esophagogastroduodenoscopy N/A 06/02/2014    Procedure: ESOPHAGOGASTRODUODENOSCOPY (EGD);  Surgeon: Rogene Houston, MD;  [facility];  Service: Endoscopy;  Laterality: N/A;  730  . Maloney dilation N/A 06/02/2014    Procedure: Venia Minks DILATION;  Surgeon: Rogene Houston, MD;  [facility];  Service: Endoscopy;  Laterality: N/A;    FAMILY HISTORY Family History  Problem Relation Age of Onset  . Cancer Sister     lung  . Cancer Brother     lipoma  . Cancer Brother     kidney   the patient's parents died from noncancer related causes. The patient had 2 sisters and 2 brothers. One sister developed lung cancer and a brother non-Hodgkin's lymphoma. A brother has also had a history of melanoma. There is no history of breast or ovarian cancer in the family  GYNECOLOGIC HISTORY:  Menarche age 56, first live birth age 37. The patient  is GX P2. She went through the change of life approximately age 59, and took hormone replacement for "many years".  SOCIAL HISTORY:  The patient's 2 children are Illene Regulus, who lives in [place] and is retired from [employer], and [employer], who also lives in [place], and is in the [place]. The patient has 5 grandchildren and 4 great-grandchildren. She is a Psychologist, forensic.    ADVANCED DIRECTIVES:    HEALTH MAINTENANCE: Social History  Substance Use Topics  . Smoking status: Never Smoker   . Smokeless tobacco: Never Used  . Alcohol Use: No    Allergies  Allergen Reactions  . Atorvastatin Other (See Comments)    Myalgias  . Hydrocodone Other (See Comments)    GI distress.    Current Outpatient Prescriptions  Medication Sig Dispense Refill  . ALPRAZolam (XANAX) 0.25 MG tablet 0.25 mg. TAKE 1/2 TABLET AS NEEDED    . amiodarone (PACERONE) 200 MG tablet Take 1 tablet (200 mg total) by mouth daily. 90 tablet 3  .  apixaban (ELIQUIS) 2.5 MG TABS tablet Take 1 tablet (2.5 mg total) by mouth 2 (two) times daily. 180 tablet 3  . exemestane (AROMASIN) 25 MG tablet Take 1 tablet (25 mg total) by mouth daily after breakfast. 90 tablet 3  . furosemide (LASIX) 40 MG tablet Take 1 tablet (40 mg total) by mouth as needed. (Patient taking differently: Take 40 mg by mouth as needed. Pt takes 1/2 tablet as needed for tightness in chest.) 90 tablet 3  . pantoprazole (PROTONIX) 40 MG tablet Take 1 tablet (40 mg total) by mouth daily before breakfast. 30 tablet 11  . potassium chloride SA (K-DUR,KLOR-CON) 20 MEQ tablet Take 1 tablet (20 mEq total) by mouth daily. 90 tablet 3  . pravastatin (PRAVACHOL) 20 MG tablet Take 1 tablet (20 mg total) by mouth daily. 90 tablet 3  . verapamil (VERELAN PM) 180 MG 24 hr capsule Take 1 capsule (180 mg total) by mouth at bedtime. 90 capsule 3   No current facility-administered medications for this visit.    OBJECTIVE: Older woman in no acute  distress Filed Vitals:   06/07/15 1350  BP: 144/76  Pulse: 63  Temp: 98 F (36.7 C)  Resp: 18     Body mass index is 34.84 kg/(m^2).    ECOG FS:1 - Symptomatic but completely ambulatory Filed Weights   06/07/15 1350  Weight: 187 lb 6.4 oz (85.004 kg)    Sclerae unicteric, EOMs intact Oropharynx clear, dentition in good repair No cervical or supraclavicular adenopathy Lungs no rales or rhonchi Heart regular rate and rhythm Abd soft, obese,nontender, positive bowel sounds MSK kyphosis butno focal spinal tenderness, no upper extremity lymphedema Neuro: nonfocal, well oriented, appropriate affect Breasts: right breast is unremarkable. The left breast is status post mastectomy. There is no evidence of local recurrence. In the area around the left upper flank where she has the itching there is no skin change and no palpable mass.   LAB RESULTS:  CMP     Component Value Date/Time   NA 142 12/08/2014 1455   NA 139 07/11/2013 0939   K 4.6 12/08/2014 1455   K 4.9 07/11/2013 0939   CL 102 07/11/2013 0939   CO2 28 12/08/2014 1455   CO2 27 07/11/2013 0939   GLUCOSE 102 12/08/2014 1455   GLUCOSE 98 07/11/2013 0939   BUN 17.7 12/08/2014 1455   BUN 15 07/11/2013 0939   CREATININE 1.0 12/08/2014 1455   CREATININE 0.95 07/11/2013 0939   CREATININE 0.91 05/22/2013 1115   CALCIUM 8.9 12/08/2014 1455   CALCIUM 9.1 07/11/2013 0939   PROT 6.6 12/08/2014 1455   PROT 6.6 03/22/2011 1429   ALBUMIN 3.1* 12/08/2014 1455   ALBUMIN 3.9 03/22/2011 1429   AST 68* 12/08/2014 1455   AST 17 03/22/2011 1429   ALT 68* 12/08/2014 1455   ALT 8 03/22/2011 1429   ALKPHOS 117 12/08/2014 1455   ALKPHOS 90 03/22/2011 1429   BILITOT 0.51 12/08/2014 1455   BILITOT 0.8 03/22/2011 1429   GFRNONAA 58* 05/22/2013 1115   GFRAA 68* 05/22/2013 1115    I No results found for: SPEP  Lab Results  Component Value Date   WBC 6.5 06/07/2015   NEUTROABS 4.1 06/07/2015   HGB 13.7 06/07/2015   HCT 42.7  06/07/2015   MCV 87.2 06/07/2015   PLT 271 06/07/2015      Chemistry      Component Value Date/Time   NA 142 12/08/2014 1455   NA 139 07/11/2013 0939  K 4.6 12/08/2014 1455   K 4.9 07/11/2013 0939   CL 102 07/11/2013 0939   CO2 28 12/08/2014 1455   CO2 27 07/11/2013 0939   BUN 17.7 12/08/2014 1455   BUN 15 07/11/2013 0939   CREATININE 1.0 12/08/2014 1455   CREATININE 0.95 07/11/2013 0939   CREATININE 0.91 05/22/2013 1115      Component Value Date/Time   CALCIUM 8.9 12/08/2014 1455   CALCIUM 9.1 07/11/2013 0939   ALKPHOS 117 12/08/2014 1455   ALKPHOS 90 03/22/2011 1429   AST 68* 12/08/2014 1455   AST 17 03/22/2011 1429   ALT 68* 12/08/2014 1455   ALT 8 03/22/2011 1429   BILITOT 0.51 12/08/2014 1455   BILITOT 0.8 03/22/2011 1429       No results found for: LABCA2  No components found for: LABCA125  No results for input(s): INR in the last 168 hours.  Urinalysis    Component Value Date/Time   COLORURINE LT. YELLOW 11/12/2008 1508   APPEARANCEUR CLEAR 11/12/2008 1508   LABSPEC >=1.030 11/12/2008 1508   PHURINE 5.0 11/12/2008 1508   GLUCOSEU NEGATIVE 11/12/2008 1508   BILIRUBINUR NEGATIVE 11/12/2008 1508   KETONESUR NEGATIVE 11/12/2008 1508   UROBILINOGEN 0.2 11/12/2008 1508   NITRITE NEGATIVE 11/12/2008 1508   LEUKOCYTESUR SMALL 11/12/2008 1508    STUDIES: CLINICAL DATA: Status post right lumpectomy in 2014. History of left mastectomy breast cancer. No current complaints.  EXAM: DIGITAL DIAGNOSTIC RIGHT MAMMOGRAM WITH 3D TOMOSYNTHESIS AND CAD  COMPARISON: Previous exam(s).  ACR Breast Density Category c: The breast tissue is heterogeneously dense, which may obscure small masses.  FINDINGS: Small group of round benign-appearing calcifications upper outer right breast are stable and have been stable for over years. There are no new or suspicious calcifications. There are no discrete masses. Architectural distortion the upper right breast  reflecting postsurgical scarring is stable the most recent prior exam. No other areas of architectural distortion.  Mammographic images were processed with CAD.  IMPRESSION: No evidence of recurrent or new breast malignancy. Benign postsurgical changes on the right.  RECOMMENDATION: Diagnostic mammography in 1 year per standard post lumpectomy protocol.  I have discussed the findings and recommendations with the patient. Results were also provided in writing at the conclusion of the visit. If applicable, a reminder letter will be sent to the patient regarding the next appointment.  BI-RADS CATEGORY 2: Benign.   Electronically Signed  By: Lajean Manes M.D.  On: 04/20/2015 09:36   ASSESSMENT: 81 y.o. [place] woman  (1) status post left modified radical mastectomy 05/25/2000 for a pT1(mic) pN2, stage IIIA invasive ductal carcinoma, grade 3 estrogen receptor 93% positive, progesterone receptor 17% positive, with an MIB-1 of 38% and no HER-2 amplification by FISH  (2) status post adjuvant chemotherapy with docetaxel, doxorubicin and cyclophosphamide x6, followed by adjuvant radiation to the left chest wall, followed by anastrozole which was continued until may of 2011  (3) status post right lumpectomy with sentinel lymph node biopsy 05/27/2013 for a pT1c pN1, stage IIA invasive ductal carcinoma, grade 2, estrogen and progesterone receptor strongly positive, HER-2 not amplified, with an MIB-1 of 55%  (4) completed adjuvant radiation to the right breast, axilla, and right supraclavicular region 09/08/2013  (5) started exemestane 12/09/2013  (6) osteopenia, with a T score of -1.2 on bone density study October 2015  PLAN: Alecea is tolerating the exemestane well and the plan is to continue that for total of 5 years. We are going to start seeing her on  a once a year basis as of now. She is very happy about that.  The only flying the ointment is the cost of the  medication. Right now she is getting it at no cost but she tells me soon it will be $100 a month. If it comes to that she will let us know and we will see if we can find something a little less expensive for her. Another possibility would be to switch to letrozole.  She knows to call for any problems that may develop before her next visit here. Chauncey Cruel, MD    11/28/20162:04 PM

## 2015-06-09 ENCOUNTER — Telehealth: Payer: Self-pay | Admitting: Oncology

## 2015-06-09 NOTE — Telephone Encounter (Signed)
s.w. pt and advised on NOV 2017 appt.Marland KitchenMarland KitchenMarland KitchenMarland Kitchenpt ok and aware

## 2015-06-29 ENCOUNTER — Encounter (INDEPENDENT_AMBULATORY_CARE_PROVIDER_SITE_OTHER): Payer: Self-pay | Admitting: Internal Medicine

## 2015-06-29 ENCOUNTER — Ambulatory Visit (INDEPENDENT_AMBULATORY_CARE_PROVIDER_SITE_OTHER): Payer: PPO | Admitting: Internal Medicine

## 2015-06-29 VITALS — BP 122/70 | HR 64 | Temp 97.5°F | Ht 61.5 in | Wt 185.6 lb

## 2015-06-29 DIAGNOSIS — K449 Diaphragmatic hernia without obstruction or gangrene: Secondary | ICD-10-CM

## 2015-06-29 DIAGNOSIS — K219 Gastro-esophageal reflux disease without esophagitis: Secondary | ICD-10-CM

## 2015-06-29 NOTE — Patient Instructions (Signed)
OV in 1 year.  

## 2015-06-29 NOTE — Progress Notes (Signed)
Subjective:    Patient ID: [name], female    DOB: 1932-06-12, 81 y.o.   MRN: [id number]  HPI Here today for f/u. She was lat seen in June by Dr. Laural Golden. Hx of GERD and hiatal hernia.  EGD 06/02/2014 revealed normal appearing esophageal mucosa without stricture formation. She had large sliding hiatal hernia with organoaxial rotation normal exam of stomach and first and second part of the duodenum. She tells me she is dong good. She know to eat only a certain amount of food and she stops. Her acid reflux is controlled with the Protonix. Her appetite is good. No weight loss.  She is eating what she wants. She is eating salads so she can have a good BM.  She has a BM daily. No melena or BRRB     Review of Systems     Past Medical History  Diagnosis Date  . Herpes zoster   . Arteriosclerotic cardiovascular disease (ASCVD)      cath Feb '07- no obstructive dz - 20% proximal LAD only; EF 65%; possible coronary artery spasm  . Hyperlipidemia     Lipid profile in 11/2008:282, 176, 64, 201  . Polio 1946    at age 74  . Peripheral neuropathy ([place])   . Anxiety   . Hypertension     diastolic dysfunction; normal CMet and TSH in 11/2009; normal CBC in 04/2010  . GERD (gastroesophageal reflux disease)     hiatal hernia  . Rectal bleed   . Chest pain     Long-standing and atypical  . Atrial fibrillation ([facility])     Paroxysmal on event recorder in 2009; regular supraventricular tachycardia at a rate of 150 also identified on that study representing either atrial flutter or PSVT; onset of persistent AF in 03/2011  . Chronic anticoagulation 04/12/2011  . Renal insufficiency   . Carcinoma of breast ([place]) 2001    Left mastectomy with positive nodes  . Malignant neoplasm of breast (female), unspecified site 05/27/2013    right breast lumpectomy with positive node  . Shortness of breath dyspnea     Past Surgical History  Procedure Laterality Date  . Cholecystectomy  1999  . Colonoscopy   02/22/2011    Procedure: COLONOSCOPY;  Surgeon: Rogene Houston, MD;  [facility];  Service: Endoscopy;  Laterality: N/A;; performed for scant hematochezia  . Esophagogastroduodenoscopy  02/22/2011    Procedure: ESOPHAGOGASTRODUODENOSCOPY (EGD);  Surgeon: Rogene Houston, MD;  [facility];  Service: Endoscopy;  Laterality: N/A;  . Mastectomy  2001    Left;modified radical with lymph node dissection  . Port-a-cath removal      pac in and out 2002  . Dilation and curettage of uterus    . Breast lumpectomy with needle localization and axillary sentinel lymph node bx Right 05/27/2013    Procedure: BREAST LUMPECTOMY WITH NEEDLE LOCALIZATION AND AXILLARY SENTINEL LYMPH NODE BX;  Surgeon: Pedro Earls, MD;  Location: [facility];  Service: General;  Laterality: Right;  . Breast surgery    . Eye surgery      both cataracts  . Esophagogastroduodenoscopy N/A 06/02/2014    Procedure: ESOPHAGOGASTRODUODENOSCOPY (EGD);  Surgeon: Rogene Houston, MD;  [facility];  Service: Endoscopy;  Laterality: N/A;  730  . Maloney dilation N/A 06/02/2014    Procedure: Venia Minks DILATION;  Surgeon: Rogene Houston, MD;  [facility];  Service: Endoscopy;  Laterality: N/A;    Allergies  Allergen Reactions  . Atorvastatin Other (See Comments)    Myalgias  . Hydrocodone Other (See Comments)    GI distress.    Current Outpatient Prescriptions on File Prior to Visit  Medication Sig Dispense Refill  . ALPRAZolam (XANAX) 0.25 MG tablet 0.25 mg. TAKE 1/2 TABLET AS NEEDED    . amiodarone (PACERONE) 200 MG tablet Take 1 tablet (200 mg total) by mouth daily. 90 tablet 3  . apixaban (ELIQUIS) 2.5 MG TABS tablet Take 1 tablet (2.5 mg total) by mouth 2 (two) times daily. 180 tablet 3  . exemestane (AROMASIN) 25 MG tablet Take 1 tablet (25 mg total) by mouth daily after breakfast. 90 tablet 3  . furosemide (LASIX) 40 MG tablet Take 1 tablet (40 mg total) by mouth  as needed. (Patient taking differently: Take 40 mg by mouth as needed. Pt takes 1/2 tablet as needed for tightness in chest.) 90 tablet 3  . losartan (COZAAR) 25 MG tablet Take 1 tablet (25 mg total) by mouth daily.    . pantoprazole (PROTONIX) 40 MG tablet Take 1 tablet (40 mg total) by mouth daily before breakfast. 30 tablet 11  . potassium chloride SA (K-DUR,KLOR-CON) 20 MEQ tablet Take 1 tablet (20 mEq total) by mouth daily. 90 tablet 3  . pravastatin (PRAVACHOL) 20 MG tablet Take 1 tablet (20 mg total) by mouth daily. 90 tablet 3  . verapamil (VERELAN PM) 180 MG 24 hr capsule Take 1 capsule (180 mg total) by mouth at bedtime. 90 capsule 3   No current facility-administered medications on file prior to visit.     Objective:   Physical Exam Blood pressure 122/70, pulse 64, temperature 97.5 F (36.4 C), height 5' 1.5" (1.562 m), weight 185 lb 9.6 oz (84.188 kg).'  Alert and oriented. Skin warm and dry. Oral mucosa is moist.   . Sclera anicteric, conjunctivae is pink. Thyroid not enlarged. No cervical lymphadenopathy. Lungs clear. Heart regular rate and rhythm.  Abdomen is soft. Bowel sounds are positive. No hepatomegaly. No abdominal masses felt. No tenderness.  No edema to lower extremities.        Assessment & Plan:  GERD/Large Hiatal hernia. Gerd controlled at this time. She know which foods to avoid. She is not overeating at meals. OV 1 year.

## 2015-07-13 DIAGNOSIS — Z79899 Other long term (current) drug therapy: Secondary | ICD-10-CM | POA: Diagnosis not present

## 2015-07-13 DIAGNOSIS — I10 Essential (primary) hypertension: Secondary | ICD-10-CM | POA: Diagnosis not present

## 2015-07-20 DIAGNOSIS — N183 Chronic kidney disease, stage 3 (moderate): Secondary | ICD-10-CM | POA: Diagnosis not present

## 2015-07-20 DIAGNOSIS — R1084 Generalized abdominal pain: Secondary | ICD-10-CM | POA: Diagnosis not present

## 2015-07-20 DIAGNOSIS — I10 Essential (primary) hypertension: Secondary | ICD-10-CM | POA: Diagnosis not present

## 2015-07-20 DIAGNOSIS — Z6836 Body mass index (BMI) 36.0-36.9, adult: Secondary | ICD-10-CM | POA: Diagnosis not present

## 2015-08-05 DIAGNOSIS — H353211 Exudative age-related macular degeneration, right eye, with active choroidal neovascularization: Secondary | ICD-10-CM | POA: Diagnosis not present

## 2015-08-25 DIAGNOSIS — H18003 Unspecified corneal deposit, bilateral: Secondary | ICD-10-CM | POA: Diagnosis not present

## 2015-08-25 DIAGNOSIS — H26491 Other secondary cataract, right eye: Secondary | ICD-10-CM | POA: Diagnosis not present

## 2015-08-25 DIAGNOSIS — H1859 Other hereditary corneal dystrophies: Secondary | ICD-10-CM | POA: Diagnosis not present

## 2015-08-27 ENCOUNTER — Telehealth: Payer: Self-pay | Admitting: *Deleted

## 2015-08-27 DIAGNOSIS — Z170 Estrogen receptor positive status [ER+]: Principal | ICD-10-CM

## 2015-08-27 DIAGNOSIS — C50919 Malignant neoplasm of unspecified site of unspecified female breast: Secondary | ICD-10-CM

## 2015-08-27 MED ORDER — EXEMESTANE 25 MG PO TABS: 25.0000 mg | ORAL_TABLET | Freq: Every day | ORAL | Status: DC

## 2015-08-27 NOTE — Telephone Encounter (Signed)
"  I am out of aromasin.  I need a refill.  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  Reports she cannot apply again until she reaches the doughnut hole.

## 2015-08-30 ENCOUNTER — Other Ambulatory Visit: Payer: Self-pay | Admitting: Obstetrics & Gynecology

## 2015-08-30 ENCOUNTER — Encounter: Payer: Self-pay | Admitting: Obstetrics & Gynecology

## 2015-08-30 ENCOUNTER — Other Ambulatory Visit (INDEPENDENT_AMBULATORY_CARE_PROVIDER_SITE_OTHER): Payer: PPO

## 2015-08-30 ENCOUNTER — Ambulatory Visit (INDEPENDENT_AMBULATORY_CARE_PROVIDER_SITE_OTHER): Payer: PPO | Admitting: Obstetrics & Gynecology

## 2015-08-30 VITALS — BP 140/80 | HR 66 | Wt 184.0 lb

## 2015-08-30 DIAGNOSIS — R102 Pelvic and perineal pain: Secondary | ICD-10-CM

## 2015-08-30 DIAGNOSIS — N949 Unspecified condition associated with female genital organs and menstrual cycle: Secondary | ICD-10-CM | POA: Diagnosis not present

## 2015-08-30 DIAGNOSIS — N858 Other specified noninflammatory disorders of uterus: Secondary | ICD-10-CM

## 2015-08-30 DIAGNOSIS — N854 Malposition of uterus: Secondary | ICD-10-CM

## 2015-08-30 DIAGNOSIS — R809 Proteinuria, unspecified: Secondary | ICD-10-CM | POA: Diagnosis not present

## 2015-08-30 NOTE — Progress Notes (Signed)
PELVIC US TA/TV: Heterogenous anteverted uterus,EEC 1.1mm w/ a 5 x 2.5 x 3.8 mm simple cyst w/in the endometrium,normal ov's bilat,no pain during ultrasound,no free fluid seen

## 2015-08-31 DIAGNOSIS — R809 Proteinuria, unspecified: Secondary | ICD-10-CM | POA: Diagnosis not present

## 2015-09-02 ENCOUNTER — Other Ambulatory Visit: Payer: Self-pay | Admitting: Obstetrics & Gynecology

## 2015-09-02 LAB — URINE CULTURE

## 2015-09-02 MED ORDER — CIPROFLOXACIN HCL 500 MG PO TABS
500.0000 mg | ORAL_TABLET | Freq: Two times a day (BID) | ORAL | Status: DC
Start: 2015-09-02 — End: 2016-05-03

## 2015-09-02 NOTE — Progress Notes (Signed)
Patient ID: [name], female   DOB: [birth year], 82 y.o.   MRN: [id number]      Chief Complaint  Patient presents with  . gyn visit    cramping like going to have period    Blood pressure 140/80, pulse 66, weight 184 lb (83.462 kg).  82 y.o. No obstetric history on file. No LMP recorded. Patient is postmenopausal. The current method of family planning is post menopausal status.  Subjective Pt feels like she is cramping like a period No urinary symptoms  Objective NEFG No lesions Exam normal  Pertinent ROS No burning with urination, frequency or urgency No nausea, vomiting or diarrhea Nor fever chills or other constitutional symptoms   Labs or studies GYNECOLOGIC SONOGRAM   [name] is a 82 y.o. for a pelvic sonogram for postmenopausal cramping.  Uterus 6.4 x 5.0 x 3.65 cm, heterogenous anteverted uterus  Endometrium 1.3 mm, symmetrical, 5 x 2.5 x 3.8 mm simple cyst w/in the endometrium  Right ovary 1.7 x 1.3 x 1 cm, wnl  Left ovary 1.6 x 1.8 x .9 cm, wnl  No free fluid seen  Technician Comments:  PELVIC US TA/TV: Heterogenous anteverted uterus,EEC 1.52mm w/ a 5 x 2.5 x 3.8 mm simple cyst w/in the endometrium,normal ov's bilat,no pain during ultrasound,no free fluid seen    [employer] 08/30/2015 5:01 PM  Clinical Impression and recommendations:  I have reviewed the sonogram results above, combined with the patient's current clinical course, below are my impressions and any appropriate recommendations for management based on the sonographic findings.  Normal post menopausal uterus Normal thin endometrium post menopausal ovaries  Normal sonogram, no anatomical etiology for patient cramping   Sharde Gover H 09/01/2015 8:14 PM    Impression Diagnoses this Encounter::   ICD-9-CM ICD-10-CM   1. Pelvic pain in female 625.9 R10.2   2. Protein in urine 791.0 R80.9 Urine culture     Established relevant diagnosis(es):   Plan/Recommendations: No orders of the defined types were placed in this encounter.    Labs or Scans Ordered: Orders Placed This Encounter  Procedures  . Urine culture    Management::   Follow up prn        Face to face time:  15 minutes  Greater than 50% of the visit time was spent in counseling and coordination of care with the patient.  The summary and outline of the counseling and care coordination is summarized in the note above.   All questions were answered.

## 2015-09-09 DIAGNOSIS — H353211 Exudative age-related macular degeneration, right eye, with active choroidal neovascularization: Secondary | ICD-10-CM | POA: Diagnosis not present

## 2015-09-09 DIAGNOSIS — H353121 Nonexudative age-related macular degeneration, left eye, early dry stage: Secondary | ICD-10-CM | POA: Diagnosis not present

## 2015-09-09 DIAGNOSIS — H353111 Nonexudative age-related macular degeneration, right eye, early dry stage: Secondary | ICD-10-CM | POA: Diagnosis not present

## 2015-09-24 DIAGNOSIS — H18003 Unspecified corneal deposit, bilateral: Secondary | ICD-10-CM | POA: Diagnosis not present

## 2015-09-24 DIAGNOSIS — H1859 Other hereditary corneal dystrophies: Secondary | ICD-10-CM | POA: Diagnosis not present

## 2015-09-24 DIAGNOSIS — H26493 Other secondary cataract, bilateral: Secondary | ICD-10-CM | POA: Diagnosis not present

## 2015-09-28 DIAGNOSIS — J111 Influenza due to unidentified influenza virus with other respiratory manifestations: Secondary | ICD-10-CM | POA: Diagnosis not present

## 2015-09-30 ENCOUNTER — Ambulatory Visit (HOSPITAL_COMMUNITY)
Admission: RE | Admit: 2015-09-30 | Discharge: 2015-09-30 | Disposition: A | Discharge status: Home / Self Care | Payer: PPO | Source: Ambulatory Visit | Attending: Internal Medicine | Admitting: Internal Medicine

## 2015-09-30 ENCOUNTER — Other Ambulatory Visit (HOSPITAL_COMMUNITY): Payer: Self-pay | Admitting: Internal Medicine

## 2015-09-30 DIAGNOSIS — R059 Cough, unspecified: Secondary | ICD-10-CM

## 2015-09-30 DIAGNOSIS — K449 Diaphragmatic hernia without obstruction or gangrene: Secondary | ICD-10-CM | POA: Diagnosis not present

## 2015-09-30 DIAGNOSIS — R05 Cough: Secondary | ICD-10-CM | POA: Insufficient documentation

## 2015-10-07 ENCOUNTER — Other Ambulatory Visit: Payer: Self-pay | Admitting: Internal Medicine

## 2015-10-21 DIAGNOSIS — H353211 Exudative age-related macular degeneration, right eye, with active choroidal neovascularization: Secondary | ICD-10-CM | POA: Diagnosis not present

## 2015-10-21 DIAGNOSIS — H353221 Exudative age-related macular degeneration, left eye, with active choroidal neovascularization: Secondary | ICD-10-CM | POA: Diagnosis not present

## 2015-11-08 DIAGNOSIS — Z885 Allergy status to narcotic agent status: Secondary | ICD-10-CM | POA: Diagnosis not present

## 2015-11-08 DIAGNOSIS — I4891 Unspecified atrial fibrillation: Secondary | ICD-10-CM | POA: Diagnosis not present

## 2015-11-08 DIAGNOSIS — H26493 Other secondary cataract, bilateral: Secondary | ICD-10-CM | POA: Diagnosis not present

## 2015-11-08 DIAGNOSIS — Z79899 Other long term (current) drug therapy: Secondary | ICD-10-CM | POA: Diagnosis not present

## 2015-11-08 DIAGNOSIS — Z853 Personal history of malignant neoplasm of breast: Secondary | ICD-10-CM | POA: Diagnosis not present

## 2015-11-08 DIAGNOSIS — I10 Essential (primary) hypertension: Secondary | ICD-10-CM | POA: Diagnosis not present

## 2015-11-08 DIAGNOSIS — Z9842 Cataract extraction status, left eye: Secondary | ICD-10-CM | POA: Diagnosis not present

## 2015-11-08 DIAGNOSIS — E7800 Pure hypercholesterolemia, unspecified: Secondary | ICD-10-CM | POA: Diagnosis not present

## 2015-11-08 DIAGNOSIS — Z85828 Personal history of other malignant neoplasm of skin: Secondary | ICD-10-CM | POA: Diagnosis not present

## 2015-11-08 DIAGNOSIS — Z961 Presence of intraocular lens: Secondary | ICD-10-CM | POA: Diagnosis not present

## 2015-11-08 DIAGNOSIS — Z7901 Long term (current) use of anticoagulants: Secondary | ICD-10-CM | POA: Diagnosis not present

## 2015-11-08 DIAGNOSIS — Z9841 Cataract extraction status, right eye: Secondary | ICD-10-CM | POA: Diagnosis not present

## 2015-11-08 DIAGNOSIS — H1859 Other hereditary corneal dystrophies: Secondary | ICD-10-CM | POA: Diagnosis not present

## 2015-11-08 DIAGNOSIS — Z9889 Other specified postprocedural states: Secondary | ICD-10-CM | POA: Diagnosis not present

## 2015-11-08 DIAGNOSIS — H18003 Unspecified corneal deposit, bilateral: Secondary | ICD-10-CM | POA: Diagnosis not present

## 2015-11-10 ENCOUNTER — Encounter: Payer: Self-pay | Admitting: Internal Medicine

## 2015-11-10 ENCOUNTER — Ambulatory Visit (INDEPENDENT_AMBULATORY_CARE_PROVIDER_SITE_OTHER): Payer: PPO | Admitting: Internal Medicine

## 2015-11-10 VITALS — BP 140/80 | HR 91 | Ht 61.0 in | Wt 189.0 lb

## 2015-11-10 DIAGNOSIS — I4891 Unspecified atrial fibrillation: Secondary | ICD-10-CM

## 2015-11-10 NOTE — Progress Notes (Signed)
HPI [name] returns today for followup. She is a very pleasant 82 year old woman with a history of paroxysmal atrial fibrillation which has become persistent. She was on flecainide for many years but eventually, the flecainide became ineffective.  She underwent mastectomy and XRT over a year ago. She was placed on amiodarone but has had recurrent atrial fib and did not know that she was out of rhythm. Her verapamil has been uptitrated.  She has occaisional palpitations.   Allergies  Allergen Reactions  . Atorvastatin Other (See Comments)    Myalgias  . Hydrocodone Other (See Comments)    GI distress.     Current Outpatient Prescriptions  Medication Sig Dispense Refill  . ALPRAZolam (XANAX) 0.25 MG tablet 0.25 mg. TAKE 1/2 TABLET AS NEEDED    . amiodarone (PACERONE) 200 MG tablet Take 1 tablet (200 mg total) by mouth daily. 90 tablet 3  . ciprofloxacin (CIPRO) 500 MG tablet Take 1 tablet (500 mg total) by mouth 2 (two) times daily. 14 tablet 0  . ELIQUIS 2.5 MG TABS tablet TAKE (1) TABLET BY MOUTH TWICE DAILY. 60 tablet 1  . exemestane (AROMASIN) 25 MG tablet Take 1 tablet (25 mg total) by mouth daily after breakfast. 90 tablet 3  . furosemide (LASIX) 40 MG tablet Take 1 tablet (40 mg total) by mouth as needed. 90 tablet 3  . losartan (COZAAR) 25 MG tablet Take 1 tablet (25 mg total) by mouth daily.    . pantoprazole (PROTONIX) 40 MG tablet Take 1 tablet (40 mg total) by mouth daily before breakfast. 30 tablet 11  . potassium chloride SA (K-DUR,KLOR-CON) 20 MEQ tablet Take 1 tablet (20 mEq total) by mouth daily. 90 tablet 3  . pravastatin (PRAVACHOL) 20 MG tablet TAKE ONE TABLET BY MOUTH DAILY. 90 tablet 1  . verapamil (VERELAN PM) 180 MG 24 hr capsule Take 1 capsule (180 mg total) by mouth at bedtime. 90 capsule 3   No current facility-administered medications for this visit.     Past Medical History  Diagnosis Date  . Herpes zoster   . Arteriosclerotic cardiovascular  disease (ASCVD)      cath Feb '07- no obstructive dz - 20% proximal LAD only; EF 65%; possible coronary artery spasm  . Hyperlipidemia     Lipid profile in 11/2008:282, 176, 64, 201  . Polio 1946    at age 73  . Peripheral neuropathy ([place])   . Anxiety   . Hypertension     diastolic dysfunction; normal CMet and TSH in 11/2009; normal CBC in 04/2010  . GERD (gastroesophageal reflux disease)     hiatal hernia  . Rectal bleed   . Chest pain     Long-standing and atypical  . Atrial fibrillation (HCC)     Paroxysmal on event recorder in 2009; regular supraventricular tachycardia at a rate of 150 also identified on that study representing either atrial flutter or PSVT; onset of persistent AF in 03/2011  . Chronic anticoagulation 04/12/2011  . Renal insufficiency   . Carcinoma of breast ([place]) 2001    Left mastectomy with positive nodes  . Malignant neoplasm of breast (female), unspecified site 05/27/2013    right breast lumpectomy with positive node  . Shortness of breath dyspnea     ROS:   All systems reviewed and negative except as noted in the HPI.   Past Surgical History  Procedure Laterality Date  . Cholecystectomy  1999  . Colonoscopy  02/22/2011  Procedure: COLONOSCOPY;  Surgeon: Rogene Houston, MD;  [facility];  Service: Endoscopy;  Laterality: N/A;; performed for scant hematochezia  . Esophagogastroduodenoscopy  02/22/2011    Procedure: ESOPHAGOGASTRODUODENOSCOPY (EGD);  Surgeon: Rogene Houston, MD;  [facility];  Service: Endoscopy;  Laterality: N/A;  . Mastectomy  2001    Left;modified radical with lymph node dissection  . Port-a-cath removal      pac in and out 2002  . Dilation and curettage of uterus    . Breast lumpectomy with needle localization and axillary sentinel lymph node bx Right 05/27/2013    Procedure: BREAST LUMPECTOMY WITH NEEDLE LOCALIZATION AND AXILLARY SENTINEL LYMPH NODE BX;  Surgeon: Pedro Earls, MD;  Location: [facility];  Service: General;  Laterality: Right;  . Breast surgery    . Eye surgery      both cataracts  . Esophagogastroduodenoscopy N/A 06/02/2014    Procedure: ESOPHAGOGASTRODUODENOSCOPY (EGD);  Surgeon: Rogene Houston, MD;  [facility];  Service: Endoscopy;  Laterality: N/A;  730  . Maloney dilation N/A 06/02/2014    Procedure: Venia Minks DILATION;  Surgeon: Rogene Houston, MD;  [facility];  Service: Endoscopy;  Laterality: N/A;     Family History  Problem Relation Age of Onset  . Cancer Sister     lung  . Cancer Brother     lipoma  . Cancer Brother     kidney     Social History   Social History  . Marital Status: Widowed    Spouse Name: N/A  . Number of Children: 2  . Years of Education: N/A   Occupational History  . homemaker    Social History Main Topics  . Smoking status: Never Smoker   . Smokeless tobacco: Never Used  . Alcohol Use: No  . Drug Use: No  . Sexual Activity: Not Currently    Birth Control/ Protection: Post-menopausal   Other Topics Concern  . Not on file   Social History Narrative     BP 140/80 mmHg  Pulse 91  Ht 5\' 1"  (1.549 m)  Wt 189 lb (85.73 kg)  BMI 35.73 kg/m2  SpO2 97%  Physical Exam:  Non-ill appearing 82 year old woman,NAD HEENT: Unremarkable Neck:  6 cm JVD, no thyromegally Back:  No CVA tenderness Lungs:  Scattered wheezes and rales, no rhonchi HEART:  Regular rate rhythm, no murmurs, no rubs, no clicks Abd:  soft, obese, positive bowel sounds, no organomegally, no rebound, no guarding Ext:  2 plus pulses, trace edema, no cyanosis, no clubbing Skin:  No rashes no nodules Neuro:  CN II through XII intact, motor grossly intact  ECG - atrial fib with a CVR   Assess/Plan: 1. Chronic atrial fib - at this point she remains out of rhythm despite amiodarone. She does not know she is out of rhythm. I have asked her to stop her amiodarone. She will continue verapamil for rate control. 2. HTN  - her blood pressure is under good control. Will continue verapamil 240 mg a day. 3. Coags - she will remain on her Eliquis 4. Dyslipidemia - I have asked her to continue pravastatin.  Mikle Bosworth.D.

## 2015-11-10 NOTE — Patient Instructions (Signed)
Your physician wants you to follow-up in: 1 Year with Dr. Lovena Le. You will receive a reminder letter in the mail two months in advance. If you don't receive a letter, please call our office to schedule the follow-up appointment.  Your physician has recommended you make the following change in your medication:   Stop Taking Amiodarone   If you need a refill on your cardiac medications before your next appointment, please call your pharmacy.  Thank you for choosing [facility]!

## 2015-11-22 DIAGNOSIS — E785 Hyperlipidemia, unspecified: Secondary | ICD-10-CM | POA: Diagnosis not present

## 2015-11-22 DIAGNOSIS — I10 Essential (primary) hypertension: Secondary | ICD-10-CM | POA: Diagnosis not present

## 2015-11-22 DIAGNOSIS — I4891 Unspecified atrial fibrillation: Secondary | ICD-10-CM | POA: Diagnosis not present

## 2015-11-30 ENCOUNTER — Other Ambulatory Visit: Payer: Self-pay | Admitting: Internal Medicine

## 2015-12-02 DIAGNOSIS — H353111 Nonexudative age-related macular degeneration, right eye, early dry stage: Secondary | ICD-10-CM | POA: Diagnosis not present

## 2015-12-02 DIAGNOSIS — H353211 Exudative age-related macular degeneration, right eye, with active choroidal neovascularization: Secondary | ICD-10-CM | POA: Diagnosis not present

## 2015-12-18 ENCOUNTER — Encounter (HOSPITAL_COMMUNITY): Payer: Self-pay | Admitting: Emergency Medicine

## 2015-12-18 ENCOUNTER — Emergency Department (HOSPITAL_COMMUNITY)
Admission: EM | Admit: 2015-12-18 | Discharge: 2015-12-18 | Disposition: A | Discharge status: Home / Self Care | Payer: PPO | Attending: Emergency Medicine | Admitting: Emergency Medicine

## 2015-12-18 ENCOUNTER — Emergency Department (HOSPITAL_COMMUNITY): Payer: PPO

## 2015-12-18 DIAGNOSIS — Y999 Unspecified external cause status: Secondary | ICD-10-CM | POA: Insufficient documentation

## 2015-12-18 DIAGNOSIS — S0083XA Contusion of other part of head, initial encounter: Secondary | ICD-10-CM | POA: Diagnosis not present

## 2015-12-18 DIAGNOSIS — S0990XA Unspecified injury of head, initial encounter: Secondary | ICD-10-CM | POA: Diagnosis not present

## 2015-12-18 DIAGNOSIS — E785 Hyperlipidemia, unspecified: Secondary | ICD-10-CM | POA: Diagnosis not present

## 2015-12-18 DIAGNOSIS — S0993XA Unspecified injury of face, initial encounter: Secondary | ICD-10-CM | POA: Diagnosis not present

## 2015-12-18 DIAGNOSIS — Y929 Unspecified place or not applicable: Secondary | ICD-10-CM | POA: Diagnosis not present

## 2015-12-18 DIAGNOSIS — S0011XA Contusion of right eyelid and periocular area, initial encounter: Secondary | ICD-10-CM | POA: Diagnosis not present

## 2015-12-18 DIAGNOSIS — Z792 Long term (current) use of antibiotics: Secondary | ICD-10-CM | POA: Diagnosis not present

## 2015-12-18 DIAGNOSIS — Z79899 Other long term (current) drug therapy: Secondary | ICD-10-CM | POA: Diagnosis not present

## 2015-12-18 DIAGNOSIS — Y939 Activity, unspecified: Secondary | ICD-10-CM | POA: Insufficient documentation

## 2015-12-18 DIAGNOSIS — I4891 Unspecified atrial fibrillation: Secondary | ICD-10-CM | POA: Insufficient documentation

## 2015-12-18 DIAGNOSIS — I10 Essential (primary) hypertension: Secondary | ICD-10-CM | POA: Insufficient documentation

## 2015-12-18 DIAGNOSIS — W1839XA Other fall on same level, initial encounter: Secondary | ICD-10-CM | POA: Insufficient documentation

## 2015-12-18 DIAGNOSIS — Z853 Personal history of malignant neoplasm of breast: Secondary | ICD-10-CM | POA: Insufficient documentation

## 2015-12-18 DIAGNOSIS — I2510 Atherosclerotic heart disease of native coronary artery without angina pectoris: Secondary | ICD-10-CM | POA: Insufficient documentation

## 2015-12-18 DIAGNOSIS — T148XXA Other injury of unspecified body region, initial encounter: Secondary | ICD-10-CM

## 2015-12-18 DIAGNOSIS — W19XXXA Unspecified fall, initial encounter: Secondary | ICD-10-CM

## 2015-12-18 NOTE — ED Provider Notes (Signed)
CSN: [id number]     Arrival date & time 12/18/15  1203 History   First MD Initiated Contact with Patient 12/18/15 1215     Chief Complaint  Patient presents with  . Fall     (Consider location/radiation/quality/duration/timing/severity/associated sxs/prior Treatment) Patient is a 82 y.o. female presenting with fall.  Fall This is a new problem. The current episode started 1 to 2 hours ago. The problem has not changed since onset.Associated symptoms include headaches. Pertinent negatives include no chest pain, no abdominal pain and no shortness of breath. Nothing aggravates the symptoms. Nothing relieves the symptoms. She has tried nothing for the symptoms.    Past Medical History  Diagnosis Date  . Herpes zoster   . Arteriosclerotic cardiovascular disease (ASCVD)      cath Feb '07- no obstructive dz - 20% proximal LAD only; EF 65%; possible coronary artery spasm  . Hyperlipidemia     Lipid profile in 11/2008:282, 176, 64, 201  . Polio 1946    at age 22  . Peripheral neuropathy ([place])   . Anxiety   . Hypertension     diastolic dysfunction; normal CMet and TSH in 11/2009; normal CBC in 04/2010  . GERD (gastroesophageal reflux disease)     hiatal hernia  . Rectal bleed   . Chest pain     Long-standing and atypical  . Atrial fibrillation (HCC)     Paroxysmal on event recorder in 2009; regular supraventricular tachycardia at a rate of 150 also identified on that study representing either atrial flutter or PSVT; onset of persistent AF in 03/2011  . Chronic anticoagulation 04/12/2011  . Renal insufficiency   . Carcinoma of breast ([place]) 2001    Left mastectomy with positive nodes  . Malignant neoplasm of breast (female), unspecified site 05/27/2013    right breast lumpectomy with positive node  . Shortness of breath dyspnea    Past Surgical History  Procedure Laterality Date  . Cholecystectomy  1999  . Colonoscopy  02/22/2011    Procedure: COLONOSCOPY;  Surgeon: Rogene Houston, MD;   [facility];  Service: Endoscopy;  Laterality: N/A;; performed for scant hematochezia  . Esophagogastroduodenoscopy  02/22/2011    Procedure: ESOPHAGOGASTRODUODENOSCOPY (EGD);  Surgeon: Rogene Houston, MD;  [facility];  Service: Endoscopy;  Laterality: N/A;  . Mastectomy  2001    Left;modified radical with lymph node dissection  . Port-a-cath removal      pac in and out 2002  . Dilation and curettage of uterus    . Breast lumpectomy with needle localization and axillary sentinel lymph node bx Right 05/27/2013    Procedure: BREAST LUMPECTOMY WITH NEEDLE LOCALIZATION AND AXILLARY SENTINEL LYMPH NODE BX;  Surgeon: Pedro Earls, MD;  Location: [place];  Service: General;  Laterality: Right;  . Breast surgery    . Eye surgery      both cataracts  . Esophagogastroduodenoscopy N/A 06/02/2014    Procedure: ESOPHAGOGASTRODUODENOSCOPY (EGD);  Surgeon: Rogene Houston, MD;  [facility];  Service: Endoscopy;  Laterality: N/A;  730  . Maloney dilation N/A 06/02/2014    Procedure: Venia Minks DILATION;  Surgeon: Rogene Houston, MD;  [facility];  Service: Endoscopy;  Laterality: N/A;   Family History  Problem Relation Age of Onset  . Cancer Sister     lung  . Cancer Brother     lipoma  . Cancer Brother     kidney   Social History  Substance Use Topics  . Smoking status: Never Smoker   . Smokeless tobacco: Never Used  . Alcohol Use: No   OB History    No data available     Review of Systems  HENT:       Pain around eye and bruising  Respiratory: Negative for shortness of breath.   Cardiovascular: Negative for chest pain.  Gastrointestinal: Negative for abdominal pain.  Neurological: Positive for headaches.  All other systems reviewed and are negative.     Allergies  Atorvastatin and Hydrocodone  Home Medications   Prior to Admission medications   Medication Sig Start Date End Date Taking? Authorizing Provider   ALPRAZolam (XANAX) 0.25 MG tablet 0.25 mg. TAKE 1/2 TABLET AS NEEDED    Historical Provider, MD  ciprofloxacin (CIPRO) 500 MG tablet Take 1 tablet (500 mg total) by mouth 2 (two) times daily. 09/02/15   Florian Buff, MD  ELIQUIS 2.5 MG TABS tablet TAKE (1) TABLET BY MOUTH TWICE DAILY. 11/30/15   Evans Lance, MD  exemestane (AROMASIN) 25 MG tablet Take 1 tablet (25 mg total) by mouth daily after breakfast. 08/27/15   Chauncey Cruel, MD  furosemide (LASIX) 40 MG tablet Take 1 tablet (40 mg total) by mouth as needed. 10/31/13   Evans Lance, MD  losartan (COZAAR) 25 MG tablet Take 1 tablet (25 mg total) by mouth daily. 06/07/15   Chauncey Cruel, MD  pantoprazole (PROTONIX) 40 MG tablet Take 1 tablet (40 mg total) by mouth daily before breakfast. 12/28/14   Rogene Houston, MD  potassium chloride SA (K-DUR,KLOR-CON) 20 MEQ tablet Take 1 tablet (20 mEq total) by mouth daily. 10/31/13   Evans Lance, MD  pravastatin (PRAVACHOL) 20 MG tablet TAKE ONE TABLET BY MOUTH DAILY. 10/07/15   Evans Lance, MD  verapamil (VERELAN PM) 180 MG 24 hr capsule Take 1 capsule (180 mg total) by mouth at bedtime. 11/18/14   Evans Lance, MD   BP 143/91 mmHg  Pulse 83  Temp(Src) 98 F (36.7 C) (Oral)  Resp 14  Ht 5' (1.524 m)  Wt 189 lb (85.73 kg)  BMI 36.91 kg/m2  SpO2 96% Physical Exam  Constitutional: She is oriented to person, place, and time. She appears well-developed and well-nourished.  HENT:  Head: Normocephalic.  Hematoma over right eye, no proptosis or EOM abnormalities.  Neck: Normal range of motion.  Cardiovascular: Normal rate and regular rhythm.   Pulmonary/Chest: No stridor. No respiratory distress.  Abdominal: She exhibits no distension.  Neurological: She is alert and oriented to person, place, and time.  No altered mental status, able to give full seemingly accurate history.  Face is symmetric, EOM's intact, pupils equal and reactive, vision intact, tongue and uvula midline without  deviation Upper and Lower extremity motor 5/5, intact pain perception in distal extremities, 2+ reflexes in biceps, patella and achilles tendons. Finger to nose normal, heel to shin normal. Walks without assistance or evident ataxia.    Nursing note and vitals reviewed.   ED Course  Procedures (including critical care time) Labs Review Labs Reviewed - No data to display  Imaging Review Ct Head Wo Contrast  12/18/2015  CLINICAL DATA:  82 year old female with a history of fall EXAM: CT HEAD WITHOUT CONTRAST CT MAXILLOFACIAL WITHOUT CONTRAST TECHNIQUE: Multidetector CT imaging of the head and maxillofacial structures were performed using the standard protocol without intravenous contrast. Multiplanar CT image reconstructions of the maxillofacial structures were also generated. COMPARISON:  CT 10/19/2013 FINDINGS: CT HEAD FINDINGS Unremarkable appearance of the calvarium without acute fracture or aggressive lesion. Soft tissue swelling in the right supraorbital region. No underlying fracture. Unremarkable appearance of the bilateral orbits. Mastoid air cells are clear. No significant paranasal sinus disease No acute intracranial hemorrhage, midline shift, or mass effect. Gray-white differentiation is maintained, without CT evidence of acute ischemia. Unremarkable configuration of the ventricles. CT MAXILLOFACIAL FINDINGS Mandible: No acute fracture identified. Temporomandibular joints approximated. Evidence of endodontal disease. Mid face: No acute bony abnormality identified. Paranasal sinuses: Patency of the frontal sinuses, ethmoid air cells, bilateral maxillary sinuses, sphenoid sinuses. Mastoid air cells are clear. No fluid within the middle ear on the left or the right. Soft tissue swelling in the right supraorbital region. No radiopaque foreign body. Multiple head and neck lymph nodes are present bilaterally, none of which are suspicious by CT size criteria or configuration. Orbits:  Unremarkable  appearance the bilateral orbits. Unremarkable appearance the bilateral globes. Unremarkable appearance of the visualized craniocervical junction and upper cervical spine IMPRESSION: Head CT: No CT evidence of acute intracranial abnormality. Right supraorbital soft tissue swelling/hematoma. Maxillofacial CT: No fracture identified. Endodontal disease. Signed, Dulcy Fanny. Earleen Newport, DO [facility] Electronically Signed   By: Corrie Mckusick D.O.   On: 12/18/2015 14:20   Ct Maxillofacial Wo Cm  12/18/2015  CLINICAL DATA:  82 year old female with a history of fall EXAM: CT HEAD WITHOUT CONTRAST CT MAXILLOFACIAL WITHOUT CONTRAST TECHNIQUE: Multidetector CT imaging of the head and maxillofacial structures were performed using the standard protocol without intravenous contrast. Multiplanar CT image reconstructions of the maxillofacial structures were also generated. COMPARISON:  CT 10/19/2013 FINDINGS: CT HEAD FINDINGS Unremarkable appearance of the calvarium without acute fracture or aggressive lesion. Soft tissue swelling in the right supraorbital region. No underlying fracture. Unremarkable appearance of the bilateral orbits. Mastoid air cells are clear. No significant paranasal sinus disease No acute intracranial hemorrhage, midline shift, or mass effect. Gray-white differentiation is maintained, without CT evidence of acute ischemia. Unremarkable configuration of the ventricles. CT MAXILLOFACIAL FINDINGS Mandible: No acute fracture identified. Temporomandibular joints approximated. Evidence of endodontal disease. Mid face: No acute bony abnormality identified. Paranasal sinuses: Patency of the frontal sinuses, ethmoid air cells, bilateral maxillary sinuses, sphenoid sinuses. Mastoid air cells are clear. No fluid within the middle ear on the left or the right. Soft tissue swelling in the right supraorbital region. No radiopaque foreign body. Multiple head and neck  lymph nodes are present bilaterally, none of which are suspicious by CT size criteria or configuration. Orbits:  Unremarkable appearance the bilateral orbits. Unremarkable appearance the bilateral globes. Unremarkable appearance of the visualized craniocervical junction and upper cervical spine IMPRESSION: Head CT: No CT evidence of acute intracranial abnormality. Right supraorbital soft tissue swelling/hematoma. Maxillofacial CT: No fracture identified. Endodontal disease. Signed, Dulcy Fanny. Earleen Newport, DO [facility] Electronically Signed   By: Corrie Mckusick D.O.   On: 12/18/2015 14:20   I have personally reviewed and evaluated these images and lab results as part of my medical decision-making.   EKG Interpretation None      MDM   Final diagnoses:  Fall, initial encounter  Hematoma    82 year old female with a mechanical fall on a liquids. CT of head and face is negative. Discussed the possibility of delayed head bleed with her and she'll return for any new or worsening symptoms. Neurologically intact time of discharge.  New Prescriptions: Discharge Medication List  as of 12/18/2015  2:32 PM       I have personally and contemperaneously reviewed labs and imaging and used in my decision making as above.   A medical screening exam was performed and I feel the patient has had an appropriate workup for their chief complaint at this time and likelihood of emergent condition existing is low and thus workup can continue on an outpatient basis.. Their vital signs are stable. They have been counseled on decision, discharge, follow up and which symptoms necessitate immediate return to the emergency department.  They verbally stated understanding and agreement with plan and discharged in stable condition.      Merrily Pew, MD 12/18/15 [phone]

## 2015-12-18 NOTE — ED Notes (Addendum)
Fell at [employer] in [place]. Injury above right eye.  Pt is on blood thinner (Eliquis).  Denies pain or dizziness.

## 2015-12-18 NOTE — ED Notes (Signed)
MD at bedside. 

## 2016-01-06 DIAGNOSIS — X32XXXD Exposure to sunlight, subsequent encounter: Secondary | ICD-10-CM | POA: Diagnosis not present

## 2016-01-06 DIAGNOSIS — C44729 Squamous cell carcinoma of skin of left lower limb, including hip: Secondary | ICD-10-CM | POA: Diagnosis not present

## 2016-01-06 DIAGNOSIS — Z1283 Encounter for screening for malignant neoplasm of skin: Secondary | ICD-10-CM | POA: Diagnosis not present

## 2016-01-06 DIAGNOSIS — L570 Actinic keratosis: Secondary | ICD-10-CM | POA: Diagnosis not present

## 2016-01-06 DIAGNOSIS — D225 Melanocytic nevi of trunk: Secondary | ICD-10-CM | POA: Diagnosis not present

## 2016-01-13 DIAGNOSIS — H353211 Exudative age-related macular degeneration, right eye, with active choroidal neovascularization: Secondary | ICD-10-CM | POA: Diagnosis not present

## 2016-01-27 ENCOUNTER — Other Ambulatory Visit (INDEPENDENT_AMBULATORY_CARE_PROVIDER_SITE_OTHER): Payer: Self-pay | Admitting: Internal Medicine

## 2016-02-09 ENCOUNTER — Telehealth: Payer: Self-pay | Admitting: Internal Medicine

## 2016-02-09 NOTE — Telephone Encounter (Signed)
Called patient. No answer. Left message to call back.  

## 2016-02-09 NOTE — Telephone Encounter (Signed)
Pt

## 2016-02-09 NOTE — Telephone Encounter (Signed)
Spoke with pt. Let her know what documents to bring [place], so we can help her fill out her assistance paperwork.

## 2016-02-10 DIAGNOSIS — Z85828 Personal history of other malignant neoplasm of skin: Secondary | ICD-10-CM | POA: Diagnosis not present

## 2016-02-10 DIAGNOSIS — Z08 Encounter for follow-up examination after completed treatment for malignant neoplasm: Secondary | ICD-10-CM | POA: Diagnosis not present

## 2016-02-10 DIAGNOSIS — D0472 Carcinoma in situ of skin of left lower limb, including hip: Secondary | ICD-10-CM | POA: Diagnosis not present

## 2016-02-14 DIAGNOSIS — Z79899 Other long term (current) drug therapy: Secondary | ICD-10-CM | POA: Diagnosis not present

## 2016-02-14 DIAGNOSIS — F419 Anxiety disorder, unspecified: Secondary | ICD-10-CM | POA: Diagnosis not present

## 2016-02-14 DIAGNOSIS — I482 Chronic atrial fibrillation: Secondary | ICD-10-CM | POA: Diagnosis not present

## 2016-02-14 DIAGNOSIS — E785 Hyperlipidemia, unspecified: Secondary | ICD-10-CM | POA: Diagnosis not present

## 2016-02-14 DIAGNOSIS — I10 Essential (primary) hypertension: Secondary | ICD-10-CM | POA: Diagnosis not present

## 2016-02-14 DIAGNOSIS — K219 Gastro-esophageal reflux disease without esophagitis: Secondary | ICD-10-CM | POA: Diagnosis not present

## 2016-02-16 ENCOUNTER — Telehealth: Payer: Self-pay | Admitting: *Deleted

## 2016-02-16 NOTE — Telephone Encounter (Signed)
Patient notified that Eliquis is at office for pick up

## 2016-02-24 DIAGNOSIS — H353211 Exudative age-related macular degeneration, right eye, with active choroidal neovascularization: Secondary | ICD-10-CM | POA: Diagnosis not present

## 2016-02-24 DIAGNOSIS — H401111 Primary open-angle glaucoma, right eye, mild stage: Secondary | ICD-10-CM | POA: Diagnosis not present

## 2016-02-25 DIAGNOSIS — N183 Chronic kidney disease, stage 3 (moderate): Secondary | ICD-10-CM | POA: Diagnosis not present

## 2016-02-25 DIAGNOSIS — I10 Essential (primary) hypertension: Secondary | ICD-10-CM | POA: Diagnosis not present

## 2016-02-25 DIAGNOSIS — E785 Hyperlipidemia, unspecified: Secondary | ICD-10-CM | POA: Diagnosis not present

## 2016-02-29 ENCOUNTER — Telehealth: Payer: Self-pay | Admitting: *Deleted

## 2016-02-29 NOTE — Telephone Encounter (Signed)
This RN returned call to pt per VM stating she has " gone into the doughnut hole with my prescriptions and the exemestane is going to cost me $ 465 a month until January "  This RN informed pt above will be reviewed with MD and other resources for appropriate antiestrogen therapy.  Return call number for pt is [phone].

## 2016-03-01 ENCOUNTER — Other Ambulatory Visit: Payer: Self-pay | Admitting: *Deleted

## 2016-03-01 ENCOUNTER — Telehealth: Payer: Self-pay | Admitting: *Deleted

## 2016-03-01 NOTE — Telephone Encounter (Signed)
This RN per communication with pharmacy obtained form for assistance with medication coverage through [employer].  This RN informed pt and post completing appropriate data mailed form to patient with written instructions.

## 2016-03-02 ENCOUNTER — Encounter: Payer: Self-pay | Admitting: Oncology

## 2016-03-02 NOTE — Progress Notes (Signed)
Left msg for pt to return my call regarding copay assistance w/ [employer] for Aromasin.

## 2016-03-03 ENCOUNTER — Encounter: Payer: Self-pay | Admitting: Oncology

## 2016-03-03 NOTE — Progress Notes (Signed)
Pt returned my call and gave me consent to apply to the [employer] [place] which pt is approved for $5000 to cover drugs associated w/ her Dx for 12 months from 03/03/16 w/ a 6 month look back period. [id number] is guaranteed, $3350 is accessible on a first-come first-served basis as long as funding is available.  I gave a copy of approval letter to HIM to scan in pt's chart.

## 2016-04-05 ENCOUNTER — Other Ambulatory Visit: Payer: Self-pay | Admitting: *Deleted

## 2016-04-05 DIAGNOSIS — C50911 Malignant neoplasm of unspecified site of right female breast: Secondary | ICD-10-CM

## 2016-04-05 DIAGNOSIS — C50912 Malignant neoplasm of unspecified site of left female breast: Principal | ICD-10-CM

## 2016-04-06 ENCOUNTER — Other Ambulatory Visit: Payer: Self-pay | Admitting: Oncology

## 2016-04-06 ENCOUNTER — Other Ambulatory Visit: Payer: Self-pay | Admitting: *Deleted

## 2016-04-06 DIAGNOSIS — C50019 Malignant neoplasm of nipple and areola, unspecified female breast: Secondary | ICD-10-CM

## 2016-04-06 DIAGNOSIS — H353211 Exudative age-related macular degeneration, right eye, with active choroidal neovascularization: Secondary | ICD-10-CM | POA: Diagnosis not present

## 2016-04-11 DIAGNOSIS — H52203 Unspecified astigmatism, bilateral: Secondary | ICD-10-CM | POA: Diagnosis not present

## 2016-04-11 DIAGNOSIS — H524 Presbyopia: Secondary | ICD-10-CM | POA: Diagnosis not present

## 2016-04-11 DIAGNOSIS — H5203 Hypermetropia, bilateral: Secondary | ICD-10-CM | POA: Diagnosis not present

## 2016-04-11 DIAGNOSIS — H1859 Other hereditary corneal dystrophies: Secondary | ICD-10-CM | POA: Diagnosis not present

## 2016-04-25 ENCOUNTER — Ambulatory Visit (HOSPITAL_COMMUNITY)
Admission: RE | Admit: 2016-04-25 | Discharge: 2016-04-25 | Disposition: A | Discharge status: Home / Self Care | Payer: PPO | Source: Ambulatory Visit | Attending: Oncology | Admitting: Oncology

## 2016-04-25 DIAGNOSIS — C50019 Malignant neoplasm of nipple and areola, unspecified female breast: Secondary | ICD-10-CM | POA: Diagnosis not present

## 2016-04-25 DIAGNOSIS — R928 Other abnormal and inconclusive findings on diagnostic imaging of breast: Secondary | ICD-10-CM | POA: Diagnosis not present

## 2016-04-27 DIAGNOSIS — J069 Acute upper respiratory infection, unspecified: Secondary | ICD-10-CM | POA: Diagnosis not present

## 2016-04-27 DIAGNOSIS — I482 Chronic atrial fibrillation: Secondary | ICD-10-CM | POA: Diagnosis not present

## 2016-05-03 ENCOUNTER — Ambulatory Visit (INDEPENDENT_AMBULATORY_CARE_PROVIDER_SITE_OTHER): Payer: PPO | Admitting: Physician Assistant

## 2016-05-03 ENCOUNTER — Encounter: Payer: Self-pay | Admitting: Physician Assistant

## 2016-05-03 ENCOUNTER — Encounter: Payer: Self-pay | Admitting: *Deleted

## 2016-05-03 VITALS — BP 136/68 | HR 85 | Ht 60.05 in | Wt 190.0 lb

## 2016-05-03 DIAGNOSIS — R0609 Other forms of dyspnea: Secondary | ICD-10-CM | POA: Diagnosis not present

## 2016-05-03 DIAGNOSIS — R0600 Dyspnea, unspecified: Secondary | ICD-10-CM

## 2016-05-03 DIAGNOSIS — I4891 Unspecified atrial fibrillation: Secondary | ICD-10-CM | POA: Diagnosis not present

## 2016-05-03 DIAGNOSIS — R600 Localized edema: Secondary | ICD-10-CM

## 2016-05-03 DIAGNOSIS — I10 Essential (primary) hypertension: Secondary | ICD-10-CM

## 2016-05-03 NOTE — Patient Instructions (Signed)
Your physician recommends that you schedule a follow-up appointment after test are complete.   Your physician has recommended that you wear a holter monitor. Holter monitors are medical devices that record the heart's electrical activity. Doctors most often use these monitors to diagnose arrhythmias. Arrhythmias are problems with the speed or rhythm of the heartbeat. The monitor is a small, portable device. You can wear one while you do your normal daily activities. This is usually used to diagnose what is causing palpitations/syncope (passing out).  Your physician has requested that you have a lexiscan myoview. For further information please visit [email or web address]. Please follow instruction sheet, as given.  Your physician has requested that you have an echocardiogram. Echocardiography is a painless test that uses sound waves to create images of your heart. It provides your doctor with information about the size and shape of your heart and how well your heart's chambers and valves are working. This procedure takes approximately one hour. There are no restrictions for this procedure.  If you need a refill on your cardiac medications before your next appointment, please call your pharmacy.  Thank you for choosing [facility]!

## 2016-05-03 NOTE — Progress Notes (Signed)
Cardiology Office Note    Date:  05/03/2016   ID:  [name], DOB [birth year], MRN [id number]  PCP:  Asencion Noble, MD  Cardiologist: Dr. Lovena Le  Chief Complaint  Patient presents with  . Shortness of Breath    History of Present Illness:  [name] is a 82 y.o. female with history of paroxysmal atrial fibrillation which has become persistent. She had been on flecainide for many years but this became ineffective. She was then placed on amiodarone but had recurrent atrial fibrillation and didn't know she was out of rhythm. Dr. Lovena Le stopped her amiodarone 11/2015 and increased her verapamil to 240 mg daily. She is also on Eliquis. She also has hypertension and dyslipidemia. Lexi scan 02/2013 was normal LVEF 57%. 2-D echo at that time showed mild LVH LVEF 55-60% with mild to moderate MR.  Patient comes in today complaining of progressive worsening of dyspnea on exertion. She says ever since May when she's been in atrial fibrillation for a long time she gets short of breath with little activity. This morning she was trying to feed the homeless at her church and she got so short of breath she developed a tightening in her throat and some dizziness and had to sit down. She says she can hardly do anything without getting out of breath. She can't feel her heart racing but has never been able to feel it. She denies any chest pain, palpitations. She has some lower extremity edema that is chronic and severe varicosities.    Past Medical History:  Diagnosis Date  . Anxiety   . Arteriosclerotic cardiovascular disease (ASCVD)     cath Feb '07- no obstructive dz - 20% proximal LAD only; EF 65%; possible coronary artery spasm  . Atrial fibrillation (HCC)    Paroxysmal on event recorder in 2009; regular supraventricular tachycardia at a rate of 150 also identified on that study representing either atrial flutter or PSVT; onset of persistent AF in 03/2011  . Carcinoma of breast ([place]) 2001   Left  mastectomy with positive nodes  . Chest pain    Long-standing and atypical  . Chronic anticoagulation 04/12/2011  . GERD (gastroesophageal reflux disease)    hiatal hernia  . Herpes zoster   . Hyperlipidemia    Lipid profile in 11/2008:282, 176, 64, 201  . Hypertension    diastolic dysfunction; normal CMet and TSH in 11/2009; normal CBC in 04/2010  . Malignant neoplasm of breast (female), unspecified site 05/27/2013   right breast lumpectomy with positive node  . Peripheral neuropathy ([place])   . Polio 1946   at age 55  . Rectal bleed   . Renal insufficiency   . Shortness of breath dyspnea     Past Surgical History:  Procedure Laterality Date  . BREAST LUMPECTOMY WITH NEEDLE LOCALIZATION AND AXILLARY SENTINEL LYMPH NODE BX Right 05/27/2013   Procedure: BREAST LUMPECTOMY WITH NEEDLE LOCALIZATION AND AXILLARY SENTINEL LYMPH NODE BX;  Surgeon: Pedro Earls, MD;  Location: [facility];  Service: General;  Laterality: Right;  . BREAST SURGERY    . CHOLECYSTECTOMY  1999  . COLONOSCOPY  02/22/2011   Procedure: COLONOSCOPY;  Surgeon: Rogene Houston, MD;  [facility];  Service: Endoscopy;  Laterality: [name];; performed for scant hematochezia  . DILATION AND CURETTAGE OF UTERUS    . ESOPHAGOGASTRODUODENOSCOPY  02/22/2011   Procedure: ESOPHAGOGASTRODUODENOSCOPY (EGD);  Surgeon: Rogene Houston, MD;  [facility];  Service: Endoscopy;  Laterality: [name];  .  ESOPHAGOGASTRODUODENOSCOPY [name] 06/02/2014   Procedure: ESOPHAGOGASTRODUODENOSCOPY (EGD);  Surgeon: Rogene Houston, MD;  [facility];  Service: Endoscopy;  Laterality: [name];  730  . EYE SURGERY     both cataracts  . MALONEY DILATION [name] 06/02/2014   Procedure: Venia Minks DILATION;  Surgeon: Rogene Houston, MD;  [facility];  Service: Endoscopy;  Laterality: [name];  . MASTECTOMY  2001   Left;modified radical with lymph node dissection  . PORT-A-CATH REMOVAL     pac in and out 2002     Current Medications: Outpatient Medications Prior to Visit  Medication Sig Dispense Refill  . ALPRAZolam (XANAX) 0.25 MG tablet 0.25 mg. TAKE 1/2 TABLET AS NEEDED    . ELIQUIS 2.5 MG TABS tablet TAKE (1) TABLET BY MOUTH TWICE DAILY. 60 tablet 11  . exemestane (AROMASIN) 25 MG tablet Take 1 tablet (25 mg total) by mouth daily after breakfast. 90 tablet 3  . furosemide (LASIX) 40 MG tablet Take 1 tablet (40 mg total) by mouth as needed. 90 tablet 3  . losartan (COZAAR) 25 MG tablet Take 1 tablet (25 mg total) by mouth daily.    . pantoprazole (PROTONIX) 40 MG tablet TAKE ONE TABLET BY MOUTH DAILY BEFORE BREAKFAST. 30 tablet 5  . verapamil (VERELAN PM) 180 MG 24 hr capsule Take 1 capsule (180 mg total) by mouth at bedtime. 90 capsule 3  . pravastatin (PRAVACHOL) 20 MG tablet TAKE ONE TABLET BY MOUTH DAILY. 90 tablet 1  . ciprofloxacin (CIPRO) 500 MG tablet Take 1 tablet (500 mg total) by mouth 2 (two) times daily. 14 tablet 0  . potassium chloride SA (K-DUR,KLOR-CON) 20 MEQ tablet Take 1 tablet (20 mEq total) by mouth daily. 90 tablet 3   No facility-administered medications prior to visit.      Allergies:   Atorvastatin and Hydrocodone   Social History   Social History  . Marital status: Widowed    Spouse name: [name]  . Number of children: 2  . Years of education: [name]   Occupational History  . homemaker    Social History Main Topics  . Smoking status: Never Smoker  . Smokeless tobacco: Never Used  . Alcohol use No  . Drug use: No  . Sexual activity: Not Currently    Birth control/ protection: Post-menopausal   Other Topics Concern  . None   Social History Narrative  . None     Family History:  The patient's   family history includes Cancer in her brother, brother, and sister.   ROS:   Please see the history of present illness.    Review of Systems  Constitution: Negative.  HENT: Negative.   Eyes: Negative.   Cardiovascular: Positive for dyspnea on exertion,  irregular heartbeat and leg swelling.  Respiratory: Positive for shortness of breath.   Hematologic/Lymphatic: Negative.   Musculoskeletal: Positive for muscle weakness. Negative for joint pain.  Gastrointestinal: Negative.   Genitourinary: Negative.   Neurological: Negative.    All other systems reviewed and are negative.   PHYSICAL EXAM:   VS:  BP 136/68   Pulse 85   Ht 5' 0.05" (1.525 m)   Wt 190 lb (86.2 kg)   SpO2 96%   BMI 37.05 kg/m   Physical Exam  GEN: Well nourished, well developed, in no acute distress  Neck: no JVD, carotid bruits, or masses Cardiac: Irregular irregular with 2/6 systolic murmur at the left sternal border, no rubs, or gallops  Respiratory:  clear to  auscultation bilaterally, normal work of breathing GI: soft, nontender, nondistended, + BS Ext: 1+ edema with significant varicosities bilaterally, Good distal pulses bilaterally MS: no deformity or atrophy  Skin: warm and dry, no rash Psych: euthymic mood, full affect  Wt Readings from Last 3 Encounters:  05/03/16 190 lb (86.2 kg)  12/18/15 189 lb (85.7 kg)  11/10/15 189 lb (85.7 kg)      Studies/Labs Reviewed:   EKG:  EKG is  ordered today.  The ekg ordered today demonstrates Atrial fibrillation at 89 bpm nonspecific ST-T wave changes, no acute change  Recent Labs: 06/07/2015: ALT 20; BUN 16.6; Creatinine 0.9; HGB 13.7; Platelets 271; Potassium 4.8; Sodium 140   Lipid Panel    Component Value Date/Time   CHOL 194 07/11/2013 0939   TRIG 123 07/11/2013 0939   HDL 71 07/11/2013 0939   CHOLHDL 2.7 07/11/2013 0939   VLDL 25 07/11/2013 0939   LDLCALC 98 07/11/2013 0939   LDLDIRECT 201.5 11/12/2008 1508    Additional studies/ records that were reviewed today include:  Lexi scan Myoview 02/2013 Impression:  Lexiscan Sestamibi:   electrically negative for ischemia.  Sestamibi scan with the probable soft tissue attenuation and normal perfusion, no evidence of significant ischemia or scar. LVEF  calculated at 57%.     Original Report Authenticated By: Dorris Carnes  2-D echo 02/2013 Study Conclusions  - Left ventricle: The cavity size was normal. Wall thickness   was increased in a pattern of mild LVH. Systolic function   was normal. The estimated ejection fraction was in the   range of 55% to 60%. - Mitral valve: Mild to moderate regurgitation. - Left atrium: The atrium was moderately dilated. - Right ventricle: The cavity size was mildly dilated. - Right atrium: The atrium was moderately dilated. - Tricuspid valve: Mild-moderate regurgitation. - Pulmonary arteries: PA peak pressure: 39mm Hg (S). Transthoracic echocardiography.  M-mode, complete 2D, spectral Doppler, and color Doppler.  Height:  Height: 154.9cm. Height: 61in.  Weight:  Weight: 87.5kg. Weight: 192.6lb.  Body mass index:  BMI: 36.5kg/m^2.  Body surface area:    BSA: 1.31m^2.  Patient status:  Outpatient. Location:  Echo laboratory.     ASSESSMENT:    1. DOE (dyspnea on exertion)   2. Atrial fibrillation, unspecified type ([place])   3. Essential hypertension   4. Lower extremity edema      PLAN:  In order of problems listed above:  Dyspnea on exertion could be multifactorial. I think it may be secondary to atrial fibrillation with RVR. We'll place a 48 hour monitor to see that her heart rate is controlled on verapamil. She could also have an ischemic component. She hasn't had a stress test since 2014. We will reassess with Lexi scan Myoview. Also order 2-D echo for LV systolic function. Follow-up with Dr. Lovena Le or myself in 2-3 weeks.  Atrial fibrillation now permanent since she failed flecainide and amiodarone. She is on Eliquis. 48 hour monitor to assess rate control.  Hypertension controlled  Lower extremity edema and varicosities recommend compression stockings     Medication Adjustments/Labs and Tests Ordered: Current medicines are reviewed at length with the patient today.  Concerns regarding  medicines are outlined above.  Medication changes, Labs and Tests ordered today are listed in the Patient Instructions below. Patient Instructions  Your physician recommends that you schedule a follow-up appointment after test are complete.   Your physician has recommended that you wear a holter monitor. Holter monitors are medical devices that  record the heart's electrical activity. Doctors most often use these monitors to diagnose arrhythmias. Arrhythmias are problems with the speed or rhythm of the heartbeat. The monitor is a small, portable device. You can wear one while you do your normal daily activities. This is usually used to diagnose what is causing palpitations/syncope (passing out).  Your physician has requested that you have a lexiscan myoview. For further information please visit [facility]. Please follow instruction sheet, as given.  Your physician has requested that you have an echocardiogram. Echocardiography is a painless test that uses sound waves to create images of your heart. It provides your doctor with information about the size and shape of your heart and how well your heart's chambers and valves are working. This procedure takes approximately one hour. There are no restrictions for this procedure.  If you need a refill on your cardiac medications before your next appointment, please call your pharmacy.  Thank you for choosing [facility]!       Signed, Ermalinda Barrios, PA-C  05/03/2016 [facility] [place], [place], [place]  [address] Phone: [phone]; Fax: [phone]

## 2016-05-05 ENCOUNTER — Ambulatory Visit: Payer: PPO | Admitting: Adult Health

## 2016-05-10 ENCOUNTER — Telehealth: Payer: Self-pay | Admitting: *Deleted

## 2016-05-10 NOTE — Telephone Encounter (Signed)
Patient notified that Eliquis in the office for pick up

## 2016-05-15 ENCOUNTER — Ambulatory Visit (HOSPITAL_COMMUNITY)
Admission: RE | Admit: 2016-05-15 | Discharge: 2016-05-15 | Disposition: A | Discharge status: Home / Self Care | Payer: PPO | Source: Ambulatory Visit | Attending: Physician Assistant | Admitting: Physician Assistant

## 2016-05-15 ENCOUNTER — Encounter (HOSPITAL_COMMUNITY)
Admission: RE | Admit: 2016-05-15 | Discharge: 2016-05-15 | Disposition: A | Discharge status: Home / Self Care | Payer: PPO | Source: Ambulatory Visit | Attending: Physician Assistant | Admitting: Physician Assistant

## 2016-05-15 ENCOUNTER — Encounter (HOSPITAL_COMMUNITY): Payer: Self-pay

## 2016-05-15 ENCOUNTER — Inpatient Hospital Stay (HOSPITAL_COMMUNITY): Admission: RE | Admit: 2016-05-15 | Payer: PPO | Source: Ambulatory Visit

## 2016-05-15 DIAGNOSIS — I4891 Unspecified atrial fibrillation: Secondary | ICD-10-CM | POA: Insufficient documentation

## 2016-05-15 DIAGNOSIS — R0609 Other forms of dyspnea: Secondary | ICD-10-CM | POA: Diagnosis not present

## 2016-05-15 DIAGNOSIS — R0600 Dyspnea, unspecified: Secondary | ICD-10-CM

## 2016-05-15 LAB — NM MYOCAR MULTI W/SPECT W/WALL MOTION / EF
CHL CUP NUCLEAR SDS: 0
CHL CUP NUCLEAR SSS: 4
LHR: 0.38
LVDIAVOL: 91 mL (ref 46–106)
LVSYSVOL: 49 mL
NUC STRESS TID: 1.03
Peak HR: 106 {beats}/min
Rest HR: 87 {beats}/min
SRS: 4

## 2016-05-15 MED ORDER — SODIUM CHLORIDE 0.9% FLUSH
INTRAVENOUS | Status: AC
  Administered 2016-05-15: 10 mL via INTRAVENOUS
  Filled 2016-05-15: qty 10

## 2016-05-15 MED ORDER — TECHNETIUM TC 99M TETROFOSMIN IV KIT
10.0000 | PACK | Freq: Once | INTRAVENOUS | Status: AC | PRN
  Administered 2016-05-15: 10.2 via INTRAVENOUS

## 2016-05-15 MED ORDER — REGADENOSON 0.4 MG/5ML IV SOLN
INTRAVENOUS | Status: AC
  Administered 2016-05-15: 0.4 mg via INTRAVENOUS
  Filled 2016-05-15: qty 5

## 2016-05-15 MED ORDER — TECHNETIUM TC 99M TETROFOSMIN IV KIT: 30.0000 | PACK | Freq: Once | INTRAVENOUS | Status: DC | PRN

## 2016-05-15 MED ORDER — TECHNETIUM TC 99M TETROFOSMIN IV KIT
30.0000 | PACK | Freq: Once | INTRAVENOUS | Status: AC | PRN
Start: 2016-05-15 — End: 2016-05-15
  Administered 2016-05-15: 30 via INTRAVENOUS

## 2016-05-15 NOTE — Progress Notes (Signed)
*  PRELIMINARY RESULTS* Echocardiogram 2D Echocardiogram has been performed.  [name] 05/15/2016, 2:07 PM

## 2016-05-19 ENCOUNTER — Telehealth: Payer: Self-pay | Admitting: *Deleted

## 2016-05-19 NOTE — Telephone Encounter (Signed)
-----   Message from Imogene Burn, PA-C sent at 05/19/2016  1:48 PM EST ----- holter shows good heart rate control

## 2016-05-19 NOTE — Telephone Encounter (Signed)
Called patient with test results. No answer. Left message to call back.  

## 2016-05-22 ENCOUNTER — Ambulatory Visit (INDEPENDENT_AMBULATORY_CARE_PROVIDER_SITE_OTHER): Payer: PPO | Admitting: Internal Medicine

## 2016-05-22 ENCOUNTER — Encounter: Payer: Self-pay | Admitting: Internal Medicine

## 2016-05-22 VITALS — BP 126/80 | HR 93 | Ht 60.5 in | Wt 191.0 lb

## 2016-05-22 DIAGNOSIS — Z79899 Other long term (current) drug therapy: Secondary | ICD-10-CM

## 2016-05-22 MED ORDER — FUROSEMIDE 40 MG PO TABS: 40.0000 mg | ORAL_TABLET | Freq: Every day | ORAL | 3 refills | Status: DC

## 2016-05-22 MED ORDER — CARVEDILOL 6.25 MG PO TABS: 6.2500 mg | ORAL_TABLET | Freq: Two times a day (BID) | ORAL | 3 refills | Status: DC

## 2016-05-22 NOTE — Progress Notes (Signed)
HPI [name] returns today for followup. She is a very pleasant 82 year old woman with a history of paroxysmal atrial fibrillation which has become persistent. She was on flecainide for many years but eventually, the flecainide became ineffective.  She was initially placed on amio but did not maintain NSR. When I saw her last, we elected to stop her amiodarone. Since then, she has developed worsening sob. A repeat echo demonstrated that her EF has gone down to 40%. A holter demonstrated no RVR of significance, NSVT, and no pauses. A stress test was not high risk. She does not have angina. No or minimal peripheral edema. Allergies  Allergen Reactions  . Atorvastatin Other (See Comments)    Myalgias  . Hydrocodone Other (See Comments)    GI distress.     Current Outpatient Prescriptions  Medication Sig Dispense Refill  . ALPRAZolam (XANAX) 0.25 MG tablet 0.25 mg. TAKE 1/2 TABLET AS NEEDED    . ELIQUIS 2.5 MG TABS tablet TAKE (1) TABLET BY MOUTH TWICE DAILY. 60 tablet 11  . exemestane (AROMASIN) 25 MG tablet Take 1 tablet (25 mg total) by mouth daily after breakfast. 90 tablet 3  . furosemide (LASIX) 40 MG tablet Take 1 tablet (40 mg total) by mouth as needed. 90 tablet 3  . losartan (COZAAR) 25 MG tablet Take 1 tablet (25 mg total) by mouth daily.    . pantoprazole (PROTONIX) 40 MG tablet TAKE ONE TABLET BY MOUTH DAILY BEFORE BREAKFAST. 30 tablet 5  . verapamil (VERELAN PM) 180 MG 24 hr capsule Take 1 capsule (180 mg total) by mouth at bedtime. 90 capsule 3   No current facility-administered medications for this visit.      Past Medical History:  Diagnosis Date  . Anxiety   . Arteriosclerotic cardiovascular disease (ASCVD)     cath Feb '07- no obstructive dz - 20% proximal LAD only; EF 65%; possible coronary artery spasm  . Atrial fibrillation (HCC)    Paroxysmal on event recorder in 2009; regular supraventricular tachycardia at a rate of 150 also identified on that study  representing either atrial flutter or PSVT; onset of persistent AF in 03/2011  . Carcinoma of breast ([place]) 2001   Left mastectomy with positive nodes  . Chest pain    Long-standing and atypical  . Chronic anticoagulation 04/12/2011  . GERD (gastroesophageal reflux disease)    hiatal hernia  . Herpes zoster   . Hyperlipidemia    Lipid profile in 11/2008:282, 176, 64, 201  . Hypertension    diastolic dysfunction; normal CMet and TSH in 11/2009; normal CBC in 04/2010  . Malignant neoplasm of breast (female), unspecified site 05/27/2013   right breast lumpectomy with positive node  . Peripheral neuropathy ([place])   . Polio 1946   at age 50  . Rectal bleed   . Renal insufficiency   . Shortness of breath dyspnea     ROS:   All systems reviewed and negative except as noted in the HPI.   Past Surgical History:  Procedure Laterality Date  . BREAST LUMPECTOMY WITH NEEDLE LOCALIZATION AND AXILLARY SENTINEL LYMPH NODE BX Right 05/27/2013   Procedure: BREAST LUMPECTOMY WITH NEEDLE LOCALIZATION AND AXILLARY SENTINEL LYMPH NODE BX;  Surgeon: Shelby Earls, MD;  Location: [facility];  Service: General;  Laterality: Right;  . BREAST SURGERY    . CHOLECYSTECTOMY  1999  . COLONOSCOPY  02/22/2011   Procedure: COLONOSCOPY;  Surgeon: Shelby Houston, MD;  [facility];  Service: Endoscopy;  Laterality: N/A;; performed for scant hematochezia  . DILATION AND CURETTAGE OF UTERUS    . ESOPHAGOGASTRODUODENOSCOPY  02/22/2011   Procedure: ESOPHAGOGASTRODUODENOSCOPY (EGD);  Surgeon: Shelby Houston, MD;  [facility];  Service: Endoscopy;  Laterality: N/A;  . ESOPHAGOGASTRODUODENOSCOPY N/A 06/02/2014   Procedure: ESOPHAGOGASTRODUODENOSCOPY (EGD);  Surgeon: Shelby Houston, MD;  [facility];  Service: Endoscopy;  Laterality: N/A;  730  . EYE SURGERY     both cataracts  . MALONEY DILATION N/A 06/02/2014   Procedure: [name] DILATION;  Surgeon: Shelby Houston,  MD;  [facility];  Service: Endoscopy;  Laterality: N/A;  . MASTECTOMY  2001   Left;modified radical with lymph node dissection  . PORT-A-CATH REMOVAL     pac in and out 2002     Family History  Problem Relation Age of Onset  . Cancer Sister     lung  . Cancer Brother     lipoma  . Cancer Brother     kidney     Social History   Social History  . Marital status: Widowed    Spouse name: N/A  . Number of children: 2  . Years of education: N/A   Occupational History  . homemaker    Social History Main Topics  . Smoking status: Never Smoker  . Smokeless tobacco: Never Used  . Alcohol use No  . Drug use: No  . Sexual activity: Not Currently    Birth control/ protection: Post-menopausal   Other Topics Concern  . Not on file   Social History Narrative  . No narrative on file     BP 126/80   Pulse 93   Ht 5' 0.5" (1.537 m)   Wt 191 lb (86.6 kg)   SpO2 97%   BMI 36.69 kg/m   Physical Exam:  Non-ill appearing 82 year old woman,NAD HEENT: Unremarkable Neck:  6 cm JVD, no thyromegally Back:  No CVA tenderness Lungs:  Scattered wheezes and rales, no rhonchi HEART:  IRegular rate rhythm, no murmurs, no rubs, no clicks Abd:  soft, obese, positive bowel sounds, no organomegally, no rebound, no guarding Ext:  2 plus pulses, trace edema, no cyanosis, no clubbing Skin:  No rashes no nodules Neuro:  CN II through XII intact, motor grossly intact   Assess/Plan: 1. Chronic atrial fib - at this point she remains out of rhythm and I would anticipate a strategy of rate control. 2. HTN - her blood pressure is under good control. Will switch from verapamil to coreg with need to uptitrate. 3. Coags - she will remain on her Eliquis 4. Worsening systolic heart failure - acute on chronic - we will stop verapamil and start her on coreg. Plan is to uptitrate. I have asked her to take lasix every day. I would anticipate increasing her losartan when I see her back.  Will have her come in for an ECG and labs and BP check in 2 weeks. I will see her back in 6 weeks.  [name].D.

## 2016-05-22 NOTE — Patient Instructions (Addendum)
Your physician recommends that you schedule a follow-up appointment in: 2 Weeks for a nurse visit for a blood pressure check and EKG.   Your physician recommends that you return for lab work in: 2 Weeks the day of your Nurse visit.   Your physician recommends that you schedule a follow-up appointment in: 6 Weeks With Dr. Lovena Le.  Your physician has recommended you make the following change in your medication:   Stop taking Verapamil   Start Taking Coreg 6.25 mg Two Times Daily   If you need a refill on your cardiac medications before your next appointment, please call your pharmacy.  Thank you for choosing [facility]!

## 2016-05-24 ENCOUNTER — Ambulatory Visit: Payer: PPO | Admitting: Physician Assistant

## 2016-05-28 ENCOUNTER — Telehealth: Payer: Self-pay | Admitting: Oncology

## 2016-05-28 NOTE — Telephone Encounter (Signed)
Called pt to advise appt chg on 11/27 from [place] to [place]. Pt says she thought she was scheduled to see GM and doesn't want to see Erasmo Downer. Gave pt appt for 11/27 @ 10am with GM.

## 2016-05-30 DIAGNOSIS — H353211 Exudative age-related macular degeneration, right eye, with active choroidal neovascularization: Secondary | ICD-10-CM | POA: Diagnosis not present

## 2016-05-31 ENCOUNTER — Other Ambulatory Visit: Payer: Self-pay | Admitting: *Deleted

## 2016-05-31 DIAGNOSIS — C50211 Malignant neoplasm of upper-inner quadrant of right female breast: Secondary | ICD-10-CM

## 2016-06-05 ENCOUNTER — Other Ambulatory Visit (HOSPITAL_BASED_OUTPATIENT_CLINIC_OR_DEPARTMENT_OTHER): Payer: PPO

## 2016-06-05 ENCOUNTER — Ambulatory Visit: Payer: PPO | Admitting: Nurse Practitioner

## 2016-06-05 ENCOUNTER — Encounter (INDEPENDENT_AMBULATORY_CARE_PROVIDER_SITE_OTHER): Payer: Self-pay | Admitting: Internal Medicine

## 2016-06-05 ENCOUNTER — Ambulatory Visit (HOSPITAL_BASED_OUTPATIENT_CLINIC_OR_DEPARTMENT_OTHER): Payer: PPO | Admitting: Oncology

## 2016-06-05 ENCOUNTER — Other Ambulatory Visit: Payer: PPO

## 2016-06-05 VITALS — BP 112/74 | HR 84 | Temp 97.6°F | Resp 18 | Ht 60.5 in | Wt 185.4 lb

## 2016-06-05 DIAGNOSIS — C50211 Malignant neoplasm of upper-inner quadrant of right female breast: Secondary | ICD-10-CM

## 2016-06-05 DIAGNOSIS — Z79811 Long term (current) use of aromatase inhibitors: Secondary | ICD-10-CM

## 2016-06-05 DIAGNOSIS — C50911 Malignant neoplasm of unspecified site of right female breast: Secondary | ICD-10-CM

## 2016-06-05 DIAGNOSIS — M8580 Other specified disorders of bone density and structure, unspecified site: Secondary | ICD-10-CM

## 2016-06-05 DIAGNOSIS — Z170 Estrogen receptor positive status [ER+]: Secondary | ICD-10-CM

## 2016-06-05 DIAGNOSIS — Z853 Personal history of malignant neoplasm of breast: Secondary | ICD-10-CM

## 2016-06-05 DIAGNOSIS — C50912 Malignant neoplasm of unspecified site of left female breast: Secondary | ICD-10-CM

## 2016-06-05 LAB — CBC WITH DIFFERENTIAL/PLATELET
BASO%: 1.2 % (ref 0.0–2.0)
Basophils Absolute: 0.1 10*3/uL (ref 0.0–0.1)
EOS ABS: 0.1 10*3/uL (ref 0.0–0.5)
EOS%: 1.3 % (ref 0.0–7.0)
HCT: 43.9 % (ref 34.8–46.6)
HEMOGLOBIN: 13.9 g/dL (ref 11.6–15.9)
LYMPH%: 24.9 % (ref 14.0–49.7)
MCH: 26.7 pg (ref 25.1–34.0)
MCHC: 31.7 g/dL (ref 31.5–36.0)
MCV: 84.3 fL (ref 79.5–101.0)
MONO#: 0.7 10*3/uL (ref 0.1–0.9)
MONO%: 12.6 % (ref 0.0–14.0)
NEUT%: 60 % (ref 38.4–76.8)
NEUTROS ABS: 3.1 10*3/uL (ref 1.5–6.5)
Platelets: 244 10*3/uL (ref 145–400)
RBC: 5.21 10*6/uL (ref 3.70–5.45)
RDW: 15.5 % — AB (ref 11.2–14.5)
WBC: 5.2 10*3/uL (ref 3.9–10.3)
lymph#: 1.3 10*3/uL (ref 0.9–3.3)

## 2016-06-05 LAB — COMPREHENSIVE METABOLIC PANEL
ALBUMIN: 3.4 g/dL — AB (ref 3.5–5.0)
ALK PHOS: 125 U/L (ref 40–150)
ALT: 10 U/L (ref 0–55)
AST: 15 U/L (ref 5–34)
Anion Gap: 9 mEq/L (ref 3–11)
BILIRUBIN TOTAL: 0.98 mg/dL (ref 0.20–1.20)
BUN: 15.3 mg/dL (ref 7.0–26.0)
CO2: 30 meq/L — AB (ref 22–29)
CREATININE: 1 mg/dL (ref 0.6–1.1)
Calcium: 9.8 mg/dL (ref 8.4–10.4)
Chloride: 103 mEq/L (ref 98–109)
EGFR: 54 mL/min/{1.73_m2} — ABNORMAL LOW (ref >90)
GLUCOSE: 105 mg/dL (ref 70–140)
Potassium: 3.9 mEq/L (ref 3.5–5.1)
SODIUM: 142 meq/L (ref 136–145)
TOTAL PROTEIN: 7 g/dL (ref 6.4–8.3)

## 2016-06-05 NOTE — Progress Notes (Signed)
[facility]  [phone] [phone]     ID: [name] OB: Dec 01, 1931  MR#: [id number]  PQZ#:[id number]  PCP: Asencion Noble, MD GYN:   SU: Johnathan Hausen OTHER MD: Tyler Pita, Cristopher Peru, Hildred Laser  CHIEF COMPLAINT: Estrogen receptor positive breast cancer  CURRENT TREATMENT:  BREAST CANCER HISTORY: From doctor [place]'s 06/26/2013 note:  "Pleasant 82 year old woman who I graduated from our practice over 3 years ago in 2011. She was a 10 year survivor of an initial stage II, 6 node positive, ER PR positive, HER-2 positive, cancer of the left breast diagnosed in October 2001 treated by mastectomy followed by 6 cycles of Cytoxan Adriamycin Taxotere chemotherapy then 5 years of Arimidex hormonal therapy. Arimidex completed in June 2011.    A recent routine mammogram done  on 03/25/2013 showed clustered calcifications which were suspicious for malignancy. She underwent a diagnostic mammogram on October 3 which confirmed these findings then she underwent a needle biopsy on October 14. MRI done on October 29 showed a 2 x 1.8 x 1.3 cm lesion at the 12:00 position. She underwent a lumpectomy and sentinel lymph node dissection on 05/27/2013 by Dr. Johnathan Hausen. Final measurements of the tumor were 1.5 cm. Primarily invasive ductal. Positive vascular and lymphatic invasion. Grade 2 of 3. Estrogen and progesterone receptors both 100%. HER-2 negative. Ki-67 55%. One of 2 sentinel lymph nodes grossly positive for invasive cancer.   She has had a number of interim medical problems since she last saw me. She has developed atrial fibrillation. This was poorly controlled medically. She underwent DC cardioversion 2 years ago but unfortunately has reverted to atrial fibrillation again recently and another cardioversion procedure is planned. She was on verapamil long-acting and was recently started on amiodarone. Of note she was also started on one of the new  oral anticoagulants, Pradaxa, in full doses of 150 mg twice daily."  Her subsequent history is as detailed below  INTERVAL HISTORY: Jonathan returns today for follow-up of her estrogen receptor positive breast cancer. She continues on exemestane, with good tolerance. Hot flashes and vaginal dryness are not a major issue. She never developed the arthralgias or myalgias that many patients can experience on this medication. She obtains it at a good price except when she falls into the "doughnut hole" in which case its more than $100 a month   REVIEW OF SYSTEMS: Suhana was having more shortness of breath and just went through a very thorough cardiology evaluation with echocardiography, Holter monitoring, and stress test. The main finding there was an ejection fraction in the 40-45%. Her medications were changed, and she started Coreg about 2 weeks ago. She says this is helping her shortness of breath although she still has a very limited exercise tolerance. She has some arthritis symptoms which are not more intense or persistent than before. She is tolerating her apixaban without any unusual bleeding or bruising problemsA detailed review of systems today was otherwise noncontributory  PAST MEDICAL HISTORY: Past Medical History:  Diagnosis Date  . Anxiety   . Arteriosclerotic cardiovascular disease (ASCVD)     cath Feb '07- no obstructive dz - 20% proximal LAD only; EF 65%; possible coronary artery spasm  . Atrial fibrillation (HCC)    Paroxysmal on event recorder in 2009; regular supraventricular tachycardia at a rate of 150 also identified on that study representing either atrial flutter or PSVT; onset of persistent AF in 03/2011  . Carcinoma of breast ([place]) 2001   Left mastectomy with  positive nodes  . Chest pain    Long-standing and atypical  . Chronic anticoagulation 04/12/2011  . GERD (gastroesophageal reflux disease)    hiatal hernia  . Herpes zoster   . Hyperlipidemia    Lipid profile in  11/2008:282, 176, 64, 201  . Hypertension    diastolic dysfunction; normal CMet and TSH in 11/2009; normal CBC in 04/2010  . Malignant neoplasm of breast (female), unspecified site 05/27/2013   right breast lumpectomy with positive node  . Peripheral neuropathy ([place])   . Polio 1946   at age 47  . Rectal bleed   . Renal insufficiency   . Shortness of breath dyspnea     PAST SURGICAL HISTORY: Past Surgical History:  Procedure Laterality Date  . BREAST LUMPECTOMY WITH NEEDLE LOCALIZATION AND AXILLARY SENTINEL LYMPH NODE BX Right 05/27/2013   Procedure: BREAST LUMPECTOMY WITH NEEDLE LOCALIZATION AND AXILLARY SENTINEL LYMPH NODE BX;  Surgeon: Pedro Earls, MD;  Location: [facility];  Service: General;  Laterality: Right;  . BREAST SURGERY    . CHOLECYSTECTOMY  1999  . COLONOSCOPY  02/22/2011   Procedure: COLONOSCOPY;  Surgeon: Rogene Houston, MD;  [facility];  Service: Endoscopy;  Laterality: N/A;; performed for scant hematochezia  . DILATION AND CURETTAGE OF UTERUS    . ESOPHAGOGASTRODUODENOSCOPY  02/22/2011   Procedure: ESOPHAGOGASTRODUODENOSCOPY (EGD);  Surgeon: Rogene Houston, MD;  [facility];  Service: Endoscopy;  Laterality: N/A;  . ESOPHAGOGASTRODUODENOSCOPY N/A 06/02/2014   Procedure: ESOPHAGOGASTRODUODENOSCOPY (EGD);  Surgeon: Rogene Houston, MD;  [facility];  Service: Endoscopy;  Laterality: N/A;  730  . EYE SURGERY     both cataracts  . MALONEY DILATION N/A 06/02/2014   Procedure: Venia Minks DILATION;  Surgeon: Rogene Houston, MD;  [facility];  Service: Endoscopy;  Laterality: N/A;  . MASTECTOMY  2001   Left;modified radical with lymph node dissection  . PORT-A-CATH REMOVAL     pac in and out 2002    FAMILY HISTORY Family History  Problem Relation Age of Onset  . Cancer Sister     lung  . Cancer Brother     lipoma  . Cancer Brother     kidney   the patient's parents died from noncancer related  causes. The patient had 2 sisters and 2 brothers. One sister developed lung cancer and a brother non-Hodgkin's lymphoma. A brother has also had a history of melanoma. There is no history of breast or ovarian cancer in the family  GYNECOLOGIC HISTORY:  Menarche age 68, first live birth age 27. The patient is GX P2. She went through the change of life approximately age 65, and took hormone replacement for "many years".  SOCIAL HISTORY:  Kaaren is a widow and lives by herself.The patient's 2 children are Illene Regulus, who lives in [place] and is retired from [employer], and [employer]nicom, who also lives in [place], and is in the [place]. The patient has 5 grandchildren and 4 great-grandchildren. She is a Psychologist, forensic.    ADVANCED DIRECTIVES: not scanned   HEALTH MAINTENANCE: Social History  Substance Use Topics  . Smoking status: Never Smoker  . Smokeless tobacco: Never Used  . Alcohol use No    Allergies  Allergen Reactions  . Atorvastatin Other (See Comments)    Myalgias  . Hydrocodone Other (See Comments)    GI distress.    Current Outpatient Prescriptions  Medication Sig Dispense Refill  . ALPRAZolam Duanne Moron)  0.25 MG tablet 0.25 mg. TAKE 1/2 TABLET AS NEEDED    . carvedilol (COREG) 6.25 MG tablet Take 1 tablet (6.25 mg total) by mouth 2 (two) times daily. 180 tablet 3  . ELIQUIS 2.5 MG TABS tablet TAKE (1) TABLET BY MOUTH TWICE DAILY. 60 tablet 11  . exemestane (AROMASIN) 25 MG tablet Take 1 tablet (25 mg total) by mouth daily after breakfast. 90 tablet 3  . furosemide (LASIX) 40 MG tablet Take 1 tablet (40 mg total) by mouth daily. 90 tablet 3  . losartan (COZAAR) 25 MG tablet Take 1 tablet (25 mg total) by mouth daily.    . pantoprazole (PROTONIX) 40 MG tablet TAKE ONE TABLET BY MOUTH DAILY BEFORE BREAKFAST. 30 tablet 5   No current facility-administered medications for this visit.     OBJECTIVE: Older white woman who appears stated age 89:   06/05/16 1016    BP: 112/74  Pulse: 84  Resp: 18  Temp: 97.6 F (36.4 C)     Body mass index is 35.61 kg/m.    ECOG FS:1 - Symptomatic but completely ambulatory Filed Weights   06/05/16 1016  Weight: 185 lb 6.4 oz (84.1 kg)   Sclerae unicteric, pupils round and equal Oropharynx clear and moist-- no thrush or other lesions No cervical or supraclavicular adenopathy Lungs no rales or rhonchi Heart regular rate and rhythm Abd soft, Obese,nontender, positive bowel sounds MSK no focal spinal tenderness, no upper extremity lymphedema Neuro: nonfocal, well oriented, appropriate affect Breasts: the right breast is benign. The left breast is status post mastectomy. There is no evidence of chest wall recurrence. The left axilla is benign.  LAB RESULTS:  CMP     Component Value Date/Time   NA 140 06/07/2015 1332   K 4.8 06/07/2015 1332   CL 102 07/11/2013 0939   CO2 29 06/07/2015 1332   GLUCOSE 99 06/07/2015 1332   BUN 16.6 06/07/2015 1332   CREATININE 0.9 06/07/2015 1332   CALCIUM 9.8 06/07/2015 1332   PROT 7.1 06/07/2015 1332   ALBUMIN 3.3 (L) 06/07/2015 1332   AST 24 06/07/2015 1332   ALT 20 06/07/2015 1332   ALKPHOS 136 06/07/2015 1332   BILITOT 0.61 06/07/2015 1332   GFRNONAA 58 (L) 05/22/2013 1115   GFRAA 68 (L) 05/22/2013 1115    I No results found for: SPEP  Lab Results  Component Value Date   WBC 5.2 06/05/2016   NEUTROABS 3.1 06/05/2016   HGB 13.9 06/05/2016   HCT 43.9 06/05/2016   MCV 84.3 06/05/2016   PLT 244 06/05/2016      Chemistry      Component Value Date/Time   NA 140 06/07/2015 1332   K 4.8 06/07/2015 1332   CL 102 07/11/2013 0939   CO2 29 06/07/2015 1332   BUN 16.6 06/07/2015 1332   CREATININE 0.9 06/07/2015 1332      Component Value Date/Time   CALCIUM 9.8 06/07/2015 1332   ALKPHOS 136 06/07/2015 1332   AST 24 06/07/2015 1332   ALT 20 06/07/2015 1332   BILITOT 0.61 06/07/2015 1332       No results found for: LABCA2  No components found for:  LABCA125  No results for input(s): INR in the last 168 hours.  Urinalysis    Component Value Date/Time   COLORURINE LT. YELLOW 11/12/2008 1508   APPEARANCEUR CLEAR 11/12/2008 1508   LABSPEC >=1.030 11/12/2008 1508   PHURINE 5.0 11/12/2008 1508   GLUCOSEU NEGATIVE 11/12/2008 1508   BILIRUBINUR NEGATIVE  11/12/2008 1508   KETONESUR NEGATIVE 11/12/2008 1508   UROBILINOGEN 0.2 11/12/2008 1508   NITRITE NEGATIVE 11/12/2008 1508   LEUKOCYTESUR SMALL 11/12/2008 1508    STUDIES:  nuclear stress test 05/15/2016   There was no ST segment deviation noted during stress.  Defect 1: There is a small defect of mild severity present in the apical anterior and apex location.  The left ventricular ejection fraction is mildly decreased (45-54%).  Intermediate risk scan  Small distal anterior and apical defect consistent with scar and /or soft tissue attenuation (breast) with septal hypokinesis.  No ischemia on nuclear stress test but systolic heart function worse than before.     CLINICAL DATA:  History of right breast cancer in 2014 status post breast conservation surgery. Additional history of left breast cancer in 2002 status post mastectomy.  EXAM: 2D DIGITAL DIAGNOSTIC UNILATERAL RIGHT MAMMOGRAM WITH CAD AND ADJUNCT TOMO  COMPARISON:  Previous exam(s).  ACR Breast Density Category b: There are scattered areas of fibroglandular density.  FINDINGS: There are stable postsurgical changes within the right breast. There are no new dominant masses, suspicious calcifications or secondary signs of malignancy within the right breast. Patient is status post left mastectomy.  Mammographic images were processed with CAD.  IMPRESSION: No evidence of malignancy.  Stable postsurgical changes.  RECOMMENDATION: Right breast diagnostic mammogram in 1 year.  I have discussed the findings and recommendations with the patient. Results were also provided in writing at the  conclusion of the visit. If applicable, a reminder letter will be sent to the patient regarding the next appointment.  BI-RADS CATEGORY  2: Benign.   Electronically Signed   By: Franki Cabot M.D.   On: 04/25/2016 10:31    ASSESSMENT: 82 y.o. [place] woman  (1) status post left modified radical mastectomy 05/25/2000 for a pT1(mic) pN2, stage IIIA upper inner quadrant invasive ductal carcinoma, grade 3 estrogen receptor 93% positive, progesterone receptor 17% positive, with an MIB-1 of 38% and no HER-2 amplification by FISH  (2) status post adjuvant chemotherapy with docetaxel, doxorubicin and cyclophosphamide x6, followed by adjuvant radiation to the left chest wall, followed by anastrozole which was continued until may of 2011  (3) status post right lumpectomy with sentinel lymph node biopsy 05/27/2013 for a pT1c pN1, stage IIA invasive ductal carcinoma, grade 2, estrogen and progesterone receptor strongly positive, HER-2 not amplified, with an MIB-1 of 55%  (4) completed adjuvant radiation to the right breast, axilla, and right supraclavicular region 09/08/2013  (5) started exemestane 12/09/2013  (6) osteopenia, with a T score of -1.2 on bone density study October 2015  PLAN: Alonna is no 3 years out from definitive surgery for her breast cancer with no evidence of disease recurrence. This is very favorable.  She continues on exemestane with very good tolerance. The plan is to continue that medication for total of 5 years.  If she gets into the "doughnut hole" and needs help with the exemestane she will give our pharmacy a call to see if that coupons or other form of assistance for her.  Otherwise she will see me again a year from now and before that visit in addition to her mammogram she will have repeat bone density scan  She knows to call for any problems that may develop before that visit. Chauncey Cruel, MD    11/27/201710:36 AM

## 2016-06-06 ENCOUNTER — Ambulatory Visit (INDEPENDENT_AMBULATORY_CARE_PROVIDER_SITE_OTHER): Payer: PPO | Admitting: *Deleted

## 2016-06-06 ENCOUNTER — Other Ambulatory Visit: Payer: Self-pay | Admitting: Internal Medicine

## 2016-06-06 ENCOUNTER — Other Ambulatory Visit: Payer: Self-pay | Admitting: Oncology

## 2016-06-06 VITALS — BP 110/74 | HR 85

## 2016-06-06 DIAGNOSIS — C50211 Malignant neoplasm of upper-inner quadrant of right female breast: Secondary | ICD-10-CM

## 2016-06-06 DIAGNOSIS — I4891 Unspecified atrial fibrillation: Secondary | ICD-10-CM

## 2016-06-06 DIAGNOSIS — Z79899 Other long term (current) drug therapy: Secondary | ICD-10-CM | POA: Diagnosis not present

## 2016-06-06 MED ORDER — CARVEDILOL 6.25 MG PO TABS: 9.3750 mg | ORAL_TABLET | Freq: Two times a day (BID) | ORAL | 6 refills | Status: DC

## 2016-06-06 NOTE — Patient Instructions (Signed)
Your physician recommends that you schedule a follow-up appointment in: 1 Week for Blood Pressure Check and EKG  Your physician has recommended you make the following change in your medication:  Increase Coreg to 9.375 mg Two Times Daily   If you need a refill on your cardiac medications before your next appointment, please call your pharmacy.  Thank you for choosing [facility]!

## 2016-06-06 NOTE — Progress Notes (Addendum)
Pt states that her breathing is better than before. Pt has no new complaints at this time. Will forward to Dr. Lovena Le and DOD.   Spoke with K. Lawrence,NP. Will increase Coreg to 9.375 mg BID and have pt come back in 1 week for BP check and EKG.

## 2016-06-06 NOTE — Addendum Note (Signed)
Addended by: Levonne Hubert on: 06/06/2016 04:34 PM   Modules accepted: Orders

## 2016-06-07 LAB — BASIC METABOLIC PANEL
BUN/Creatinine Ratio: 15 (ref 12–28)
BUN: 15 mg/dL (ref 8–27)
CALCIUM: 9.4 mg/dL (ref 8.7–10.3)
CHLORIDE: 99 mmol/L (ref 96–106)
CO2: 27 mmol/L (ref 18–29)
Creatinine, Ser: 1.03 mg/dL — ABNORMAL HIGH (ref 0.57–1.00)
GFR calc non Af Amer: 51 mL/min/{1.73_m2} — ABNORMAL LOW (ref >59)
GFR, EST AFRICAN AMERICAN: 58 mL/min/{1.73_m2} — AB (ref >59)
Glucose: 75 mg/dL (ref 65–99)
POTASSIUM: 4.4 mmol/L (ref 3.5–5.2)
Sodium: 143 mmol/L (ref 134–144)

## 2016-06-13 ENCOUNTER — Ambulatory Visit (INDEPENDENT_AMBULATORY_CARE_PROVIDER_SITE_OTHER): Payer: PPO

## 2016-06-13 VITALS — BP 125/84 | HR 104 | Ht 61.0 in | Wt 185.0 lb

## 2016-06-13 DIAGNOSIS — I4891 Unspecified atrial fibrillation: Secondary | ICD-10-CM | POA: Diagnosis not present

## 2016-06-13 NOTE — Patient Instructions (Signed)
We will call you with instructions regarding your medications     Thank you for choosing [facility] !

## 2016-06-13 NOTE — Progress Notes (Signed)
Patient returns for EKG after Coreg dose was increased to 9.375 mg twice a day.Patient walked in and HR was initially 104 bpm. She states on days she feels good,she is "great" , other days she is tired. Wasn't aware HR was elevated upon arrival. I told he after [name] reviews her chart tomorrow, we will call her with instructions.EKG afib @ 86 bpm

## 2016-06-15 ENCOUNTER — Encounter: Payer: Self-pay | Admitting: Adult Health

## 2016-06-15 ENCOUNTER — Telehealth: Payer: Self-pay

## 2016-06-15 NOTE — Telephone Encounter (Signed)
Pt has been on increased dose since 06/06/16 and will return 12/14 at 4 pm for repeat EKG

## 2016-06-15 NOTE — Telephone Encounter (Signed)
Editor: Lendon Colonel, NP (Nurse Practitioner)    Reviewed EKG from 06/13/2016. Remains in Atrial fib with fair rate control, HR 86 bpm. . Would not change any medications at this time. Read over Dr.Taylors previous note in the office from 05/22/2016. Rate control is plan for now. She should remain on Eliquis. Would increase coreg to 9.375 BID with EF of 45%.  Come back for follow up EKG in one week to assess rate control.   Jory Sims NP

## 2016-06-15 NOTE — Progress Notes (Signed)
Reviewed EKG from 06/13/2016. Remains in Atrial fib with fair rate control, HR 86 bpm. . Would not change any medications at this time. Read over Dr.Taylors previous note in the office from 05/22/2016. Rate control is plan for now. She should remain on Eliquis. Would increase coreg to 9.375 BID with EF of 45%.  Come back for follow up EKG in one week to assess rate control.   Jory Sims NP

## 2016-06-22 ENCOUNTER — Ambulatory Visit (INDEPENDENT_AMBULATORY_CARE_PROVIDER_SITE_OTHER): Payer: PPO

## 2016-06-22 VITALS — BP 122/64 | HR 91 | Wt 184.0 lb

## 2016-06-22 DIAGNOSIS — I4891 Unspecified atrial fibrillation: Secondary | ICD-10-CM

## 2016-06-22 MED ORDER — CARVEDILOL 12.5 MG PO TABS: 12.5000 mg | ORAL_TABLET | Freq: Two times a day (BID) | ORAL | 3 refills | Status: DC

## 2016-06-22 NOTE — Patient Instructions (Signed)
Your physician recommends that you schedule a follow-up appointment in: 1 week for repeat EKG  INCREASE Coreg 12.5 mg twice a day     Thank you for choosing [facility] !

## 2016-06-22 NOTE — Progress Notes (Signed)
EKG today afib HR 92. Per Arnold Long NP, increase Coreg to 12.5 mg twice a day and return in 1 week for repeat EKG.Goal is HR below 80

## 2016-06-28 ENCOUNTER — Ambulatory Visit (INDEPENDENT_AMBULATORY_CARE_PROVIDER_SITE_OTHER): Payer: PPO | Admitting: Internal Medicine

## 2016-06-28 ENCOUNTER — Encounter (INDEPENDENT_AMBULATORY_CARE_PROVIDER_SITE_OTHER): Payer: Self-pay | Admitting: Internal Medicine

## 2016-06-28 VITALS — BP 120/80 | HR 64 | Temp 98.0°F | Ht 61.5 in | Wt 186.6 lb

## 2016-06-28 DIAGNOSIS — K219 Gastro-esophageal reflux disease without esophagitis: Secondary | ICD-10-CM | POA: Diagnosis not present

## 2016-06-28 DIAGNOSIS — K449 Diaphragmatic hernia without obstruction or gangrene: Secondary | ICD-10-CM | POA: Diagnosis not present

## 2016-06-28 NOTE — Patient Instructions (Signed)
Continue the Protonix. OV in 1 year.  

## 2016-06-28 NOTE — Progress Notes (Signed)
Subjective:    Patient ID: [name], female    DOB: [birth year], 82 y.o.   MRN: [id number]  HPIWt in December 2016 was 185. HPI Here today for f/u. She was lat seen in December by me.  Hx of GERD and hiatal hernia.  EGD 06/02/2014 revealed normal appearing esophageal mucosa without stricture formation. She had large sliding hiatal hernia with organoaxial rotation normal exam of stomach and first and second part of the duodenum. She tells me she is dong good. She know to eat only a certain amount of food and she stops. Her acid reflux is controlled with the Protonix. Her appetite is good. No weight loss.  She is eating what she wants. She is eating salads so she can have a good BM.  She has a BM daily and sometimes 2-3.  No melena or BRRB  Eliquis daily for atrial fib.  Hx of breast cancer.  Review of Systems Past Medical History:  Diagnosis Date  . Anxiety   . Arteriosclerotic cardiovascular disease (ASCVD)     cath Feb '07- no obstructive dz - 20% proximal LAD only; EF 65%; possible coronary artery spasm  . Atrial fibrillation (HCC)    Paroxysmal on event recorder in 2009; regular supraventricular tachycardia at a rate of 150 also identified on that study representing either atrial flutter or PSVT; onset of persistent AF in 03/2011  . Carcinoma of breast ([place]) 2001   Left mastectomy with positive nodes  . Chest pain    Long-standing and atypical  . Chronic anticoagulation 04/12/2011  . GERD (gastroesophageal reflux disease)    hiatal hernia  . Herpes zoster   . Hyperlipidemia    Lipid profile in 11/2008:282, 176, 64, 201  . Hypertension    diastolic dysfunction; normal CMet and TSH in 11/2009; normal CBC in 04/2010  . Malignant neoplasm of breast (female), unspecified site 05/27/2013   right breast lumpectomy with positive node  . Peripheral neuropathy ([place])   . Polio 1946   at age 20  . Rectal bleed   . Renal insufficiency   . Shortness of breath dyspnea     Past  Surgical History:  Procedure Laterality Date  . BREAST LUMPECTOMY WITH NEEDLE LOCALIZATION AND AXILLARY SENTINEL LYMPH NODE BX Right 05/27/2013   Procedure: BREAST LUMPECTOMY WITH NEEDLE LOCALIZATION AND AXILLARY SENTINEL LYMPH NODE BX;  Surgeon: Pedro Earls, MD;  Location: [facility];  Service: General;  Laterality: Right;  . BREAST SURGERY    . CHOLECYSTECTOMY  1999  . COLONOSCOPY  02/22/2011   Procedure: COLONOSCOPY;  Surgeon: Rogene Houston, MD;  [facility];  Service: Endoscopy;  Laterality: N/A;; performed for scant hematochezia  . DILATION AND CURETTAGE OF UTERUS    . ESOPHAGOGASTRODUODENOSCOPY  02/22/2011   Procedure: ESOPHAGOGASTRODUODENOSCOPY (EGD);  Surgeon: Rogene Houston, MD;  [facility];  Service: Endoscopy;  Laterality: N/A;  . ESOPHAGOGASTRODUODENOSCOPY N/A 06/02/2014   Procedure: ESOPHAGOGASTRODUODENOSCOPY (EGD);  Surgeon: Rogene Houston, MD;  [facility];  Service: Endoscopy;  Laterality: N/A;  730  . EYE SURGERY     both cataracts  . MALONEY DILATION N/A 06/02/2014   Procedure: Venia Minks DILATION;  Surgeon: Rogene Houston, MD;  [facility];  Service: Endoscopy;  Laterality: N/A;  . MASTECTOMY  2001   Left;modified radical with lymph node dissection  . PORT-A-CATH REMOVAL     pac in and out 2002    Allergies  Allergen Reactions  .  Atorvastatin Other (See Comments)    Myalgias  . Hydrocodone Other (See Comments)    GI distress.    Current Outpatient Prescriptions on File Prior to Visit  Medication Sig Dispense Refill  . ALPRAZolam (XANAX) 0.25 MG tablet 0.25 mg. TAKE 1/2 TABLET AS NEEDED    . carvedilol (COREG) 12.5 MG tablet Take 1 tablet (12.5 mg total) by mouth 2 (two) times daily. 180 tablet 3  . ELIQUIS 2.5 MG TABS tablet TAKE (1) TABLET BY MOUTH TWICE DAILY. 60 tablet 11  . exemestane (AROMASIN) 25 MG tablet Take 1 tablet (25 mg total) by mouth daily after breakfast. 90 tablet 3  .  furosemide (LASIX) 40 MG tablet Take 1 tablet (40 mg total) by mouth daily. 90 tablet 3  . losartan (COZAAR) 25 MG tablet Take 1 tablet (25 mg total) by mouth daily.    . pantoprazole (PROTONIX) 40 MG tablet TAKE ONE TABLET BY MOUTH DAILY BEFORE BREAKFAST. 30 tablet 5   No current facility-administered medications on file prior to visit.        Objective:   Physical Exam Blood pressure 120/80, pulse 64, temperature 98 F (36.7 C), height 5' 1.5" (1.562 m), weight 186 lb 9.6 oz (84.6 kg).  Alert and oriented. Skin warm and dry. Oral mucosa is moist.   . Sclera anicteric, conjunctivae is pink. Thyroid not enlarged. No cervical lymphadenopathy. Lungs clear. Heart regular rate and rhythm.  Abdomen is soft. Bowel sounds are positive. No hepatomegaly. No abdominal masses felt. No tenderness.  No edema to lower extremities.        Assessment & Plan:  GERD/Hiatal hernia. Continue the Protonix OV in1 year.

## 2016-06-29 ENCOUNTER — Ambulatory Visit (INDEPENDENT_AMBULATORY_CARE_PROVIDER_SITE_OTHER): Payer: PPO

## 2016-06-29 VITALS — BP 116/75 | HR 91 | Wt 182.6 lb

## 2016-06-29 DIAGNOSIS — I4891 Unspecified atrial fibrillation: Secondary | ICD-10-CM | POA: Diagnosis not present

## 2016-06-29 NOTE — Progress Notes (Signed)
Pt is doing well. Her bp is down from last check.

## 2016-06-29 NOTE — Patient Instructions (Addendum)
Your physician recommends that you continue on your current medications as directed. Please refer to the Current Medication list given to you today.   Your physician recommends that you schedule a follow-up appointment in: 1/4 with Dr. Lovena Le  Thanks for choosing [facility]!!!

## 2016-07-04 ENCOUNTER — Telehealth: Payer: Self-pay | Admitting: Internal Medicine

## 2016-07-04 NOTE — Telephone Encounter (Signed)
Patient states that she wants to ask a question about a medication that she is taking. / tg

## 2016-07-04 NOTE — Telephone Encounter (Signed)
Pt states Dr Willey Blade increased Losartan to 25 mg twice a day recently .

## 2016-07-13 ENCOUNTER — Encounter: Payer: Self-pay | Admitting: Internal Medicine

## 2016-07-13 ENCOUNTER — Ambulatory Visit (INDEPENDENT_AMBULATORY_CARE_PROVIDER_SITE_OTHER): Payer: PPO | Admitting: Internal Medicine

## 2016-07-13 VITALS — BP 113/77 | HR 81 | Ht 61.0 in | Wt 185.0 lb

## 2016-07-13 DIAGNOSIS — I5022 Chronic systolic (congestive) heart failure: Secondary | ICD-10-CM | POA: Diagnosis not present

## 2016-07-13 DIAGNOSIS — I4891 Unspecified atrial fibrillation: Secondary | ICD-10-CM | POA: Diagnosis not present

## 2016-07-13 NOTE — Patient Instructions (Signed)
Your physician wants you to follow-up in: 6 Months with Dr. Taylor. You will receive a reminder letter in the mail two months in advance. If you don't receive a letter, please call our office to schedule the follow-up appointment.  Your physician recommends that you continue on your current medications as directed. Please refer to the Current Medication list given to you today.  If you need a refill on your cardiac medications before your next appointment, please call your pharmacy.  Thank you for choosing [facility]!   

## 2016-07-13 NOTE — Progress Notes (Signed)
HPI Mrs. Chrobak returns today for followup. She is a very pleasant 83 year old woman with a history of paroxysmal atrial fibrillation which has become persistent. She was on flecainide for many years but eventually, the flecainide became ineffective.  She was initially placed on amio but did not maintain NSR. When I saw her last, we elected to stop her amiodarone. Since then, she has developed worsening sob. A repeat echo demonstrated that her EF has gone down to 40%. A holter demonstrated no RVR of significance, NSVT, and no pauses. A stress test was not high risk. She does not have angina. No or minimal peripheral edema. Allergies  Allergen Reactions  . Atorvastatin Other (See Comments)    Myalgias  . Hydrocodone Other (See Comments)    GI distress.     Current Outpatient Prescriptions  Medication Sig Dispense Refill  . ALPRAZolam (XANAX) 0.25 MG tablet 0.25 mg. TAKE 1/2 TABLET AS NEEDED    . carvedilol (COREG) 12.5 MG tablet Take 1 tablet (12.5 mg total) by mouth 2 (two) times daily. 180 tablet 3  . ELIQUIS 2.5 MG TABS tablet TAKE (1) TABLET BY MOUTH TWICE DAILY. 60 tablet 11  . exemestane (AROMASIN) 25 MG tablet Take 1 tablet (25 mg total) by mouth daily after breakfast. 90 tablet 3  . furosemide (LASIX) 40 MG tablet Take 1 tablet (40 mg total) by mouth daily. (Patient taking differently: Take 40 mg by mouth daily. Pt cut her dose to 20 mg daily) 90 tablet 3  . losartan (COZAAR) 25 MG tablet Take 25 mg by mouth 2 (two) times daily.    . pantoprazole (PROTONIX) 40 MG tablet TAKE ONE TABLET BY MOUTH DAILY BEFORE BREAKFAST. 30 tablet 5   No current facility-administered medications for this visit.      Past Medical History:  Diagnosis Date  . Anxiety   . Arteriosclerotic cardiovascular disease (ASCVD)     cath Feb '07- no obstructive dz - 20% proximal LAD only; EF 65%; possible coronary artery spasm  . Atrial fibrillation (HCC)    Paroxysmal on event recorder in 2009;  regular supraventricular tachycardia at a rate of 150 also identified on that study representing either atrial flutter or PSVT; onset of persistent AF in 03/2011  . Carcinoma of breast ([place]) 2001   Left mastectomy with positive nodes  . Chest pain    Long-standing and atypical  . Chronic anticoagulation 04/12/2011  . GERD (gastroesophageal reflux disease)    hiatal hernia  . Herpes zoster   . Hyperlipidemia    Lipid profile in 11/2008:282, 176, 64, 201  . Hypertension    diastolic dysfunction; normal CMet and TSH in 11/2009; normal CBC in 04/2010  . Malignant neoplasm of breast (female), unspecified site 05/27/2013   right breast lumpectomy with positive node  . Peripheral neuropathy ([place])   . Polio 1946   at age 37  . Rectal bleed   . Renal insufficiency   . Shortness of breath dyspnea     ROS:   All systems reviewed and negative except as noted in the HPI.   Past Surgical History:  Procedure Laterality Date  . BREAST LUMPECTOMY WITH NEEDLE LOCALIZATION AND AXILLARY SENTINEL LYMPH NODE BX Right 05/27/2013   Procedure: BREAST LUMPECTOMY WITH NEEDLE LOCALIZATION AND AXILLARY SENTINEL LYMPH NODE BX;  Surgeon: Pedro Earls, MD;  Location: [facility];  Service: General;  Laterality: Right;  . BREAST SURGERY    . CHOLECYSTECTOMY  1999  .  COLONOSCOPY  02/22/2011   Procedure: COLONOSCOPY;  Surgeon: Rogene Houston, MD;  [facility];  Service: Endoscopy;  Laterality: N/A;; performed for scant hematochezia  . DILATION AND CURETTAGE OF UTERUS    . ESOPHAGOGASTRODUODENOSCOPY  02/22/2011   Procedure: ESOPHAGOGASTRODUODENOSCOPY (EGD);  Surgeon: Rogene Houston, MD;  [facility];  Service: Endoscopy;  Laterality: N/A;  . ESOPHAGOGASTRODUODENOSCOPY N/A 06/02/2014   Procedure: ESOPHAGOGASTRODUODENOSCOPY (EGD);  Surgeon: Rogene Houston, MD;  [facility];  Service: Endoscopy;  Laterality: N/A;  730  . EYE SURGERY     both cataracts  . MALONEY  DILATION N/A 06/02/2014   Procedure: Venia Minks DILATION;  Surgeon: Rogene Houston, MD;  [facility];  Service: Endoscopy;  Laterality: N/A;  . MASTECTOMY  2001   Left;modified radical with lymph node dissection  . PORT-A-CATH REMOVAL     pac in and out 2002     Family History  Problem Relation Age of Onset  . Cancer Sister     lung  . Cancer Brother     lipoma  . Cancer Brother     kidney     Social History   Social History  . Marital status: Widowed    Spouse name: N/A  . Number of children: 2  . Years of education: N/A   Occupational History  . homemaker    Social History Main Topics  . Smoking status: Never Smoker  . Smokeless tobacco: Never Used  . Alcohol use No  . Drug use: No  . Sexual activity: Not Currently    Birth control/ protection: Post-menopausal   Other Topics Concern  . Not on file   Social History Narrative  . No narrative on file     BP 113/77   Pulse 81   Ht 5\' 1"  (1.549 m)   Wt 185 lb (83.9 kg)   BMI 34.96 kg/m   Physical Exam:  Non-ill appearing 83 year old woman,NAD HEENT: Unremarkable Neck:  6 cm JVD, no thyromegally Back:  No CVA tenderness Lungs:  Scattered wheezes and rales, no rhonchi HEART:  IRegular rate rhythm, no murmurs, no rubs, no clicks Abd:  soft, obese, positive bowel sounds, no organomegally, no rebound, no guarding Ext:  2 plus pulses, trace edema, no cyanosis, no clubbing Skin:  No rashes no nodules Neuro:  CN II through XII intact, motor grossly intact   Assess/Plan: 1. Chronic atrial fib - at this point she remains out of rhythm and I would anticipate continuing a strategy of rate control.  2. HTN - her blood pressure is under good control. She will continue her current dose of coreg. 3. Coags - she will remain on her Eliquis 4. Worsening systolic heart failure - acute on chronic - Her symptoms are better. She is bothered by some dizziness. I have discussed reducing the lasix or the losartan  but would like to continue both. A strategy to avoid dizziness was outlined. I will see her back in 6 months.  Mikle Bosworth.D.

## 2016-08-03 DIAGNOSIS — H401111 Primary open-angle glaucoma, right eye, mild stage: Secondary | ICD-10-CM | POA: Diagnosis not present

## 2016-08-03 DIAGNOSIS — H3561 Retinal hemorrhage, right eye: Secondary | ICD-10-CM | POA: Diagnosis not present

## 2016-08-03 DIAGNOSIS — H353211 Exudative age-related macular degeneration, right eye, with active choroidal neovascularization: Secondary | ICD-10-CM | POA: Diagnosis not present

## 2016-08-03 DIAGNOSIS — H353121 Nonexudative age-related macular degeneration, left eye, early dry stage: Secondary | ICD-10-CM | POA: Diagnosis not present

## 2016-08-03 DIAGNOSIS — H353111 Nonexudative age-related macular degeneration, right eye, early dry stage: Secondary | ICD-10-CM | POA: Diagnosis not present

## 2016-08-21 DIAGNOSIS — E785 Hyperlipidemia, unspecified: Secondary | ICD-10-CM | POA: Diagnosis not present

## 2016-08-21 DIAGNOSIS — Z79899 Other long term (current) drug therapy: Secondary | ICD-10-CM | POA: Diagnosis not present

## 2016-08-21 DIAGNOSIS — K219 Gastro-esophageal reflux disease without esophagitis: Secondary | ICD-10-CM | POA: Diagnosis not present

## 2016-08-21 DIAGNOSIS — I10 Essential (primary) hypertension: Secondary | ICD-10-CM | POA: Diagnosis not present

## 2016-08-29 DIAGNOSIS — I5022 Chronic systolic (congestive) heart failure: Secondary | ICD-10-CM | POA: Diagnosis not present

## 2016-08-29 DIAGNOSIS — Z6836 Body mass index (BMI) 36.0-36.9, adult: Secondary | ICD-10-CM | POA: Diagnosis not present

## 2016-08-29 DIAGNOSIS — I482 Chronic atrial fibrillation: Secondary | ICD-10-CM | POA: Diagnosis not present

## 2016-08-29 DIAGNOSIS — I10 Essential (primary) hypertension: Secondary | ICD-10-CM | POA: Diagnosis not present

## 2016-09-01 ENCOUNTER — Other Ambulatory Visit (INDEPENDENT_AMBULATORY_CARE_PROVIDER_SITE_OTHER): Payer: Self-pay | Admitting: *Deleted

## 2016-09-01 MED ORDER — PANTOPRAZOLE SODIUM 40 MG PO TBEC: 40.0000 mg | DELAYED_RELEASE_TABLET | Freq: Every day | ORAL | 5 refills | Status: DC

## 2016-09-01 NOTE — Telephone Encounter (Signed)
Rx was escribed tot he pharmacy.

## 2016-09-05 ENCOUNTER — Other Ambulatory Visit: Payer: Self-pay | Admitting: Oncology

## 2016-09-05 DIAGNOSIS — Z170 Estrogen receptor positive status [ER+]: Principal | ICD-10-CM

## 2016-09-05 DIAGNOSIS — C50919 Malignant neoplasm of unspecified site of unspecified female breast: Secondary | ICD-10-CM

## 2016-09-13 ENCOUNTER — Ambulatory Visit (INDEPENDENT_AMBULATORY_CARE_PROVIDER_SITE_OTHER): Payer: PPO

## 2016-09-13 ENCOUNTER — Ambulatory Visit (INDEPENDENT_AMBULATORY_CARE_PROVIDER_SITE_OTHER): Payer: PPO | Admitting: Orthopaedic Surgery

## 2016-09-13 ENCOUNTER — Encounter (INDEPENDENT_AMBULATORY_CARE_PROVIDER_SITE_OTHER): Payer: Self-pay | Admitting: Orthopaedic Surgery

## 2016-09-13 VITALS — BP 117/81 | HR 100 | Resp 14 | Ht 60.5 in | Wt 180.0 lb

## 2016-09-13 DIAGNOSIS — G8929 Other chronic pain: Secondary | ICD-10-CM

## 2016-09-13 DIAGNOSIS — M25561 Pain in right knee: Secondary | ICD-10-CM | POA: Diagnosis not present

## 2016-09-13 DIAGNOSIS — M25562 Pain in left knee: Secondary | ICD-10-CM

## 2016-09-13 MED ORDER — BUPIVACAINE HCL 0.5 % IJ SOLN
3.0000 mL | INTRAMUSCULAR | Status: AC | PRN
  Administered 2016-09-13: 3 mL via INTRA_ARTICULAR

## 2016-09-13 MED ORDER — METHYLPREDNISOLONE ACETATE 40 MG/ML IJ SUSP
80.0000 mg | INTRAMUSCULAR | Status: AC | PRN
  Administered 2016-09-13: 80 mg

## 2016-09-13 MED ORDER — LIDOCAINE HCL 1 % IJ SOLN
5.0000 mL | INTRAMUSCULAR | Status: AC | PRN
  Administered 2016-09-13: 5 mL

## 2016-09-13 NOTE — Progress Notes (Signed)
Office Visit Note   Patient: [name]           Date of Birth: May 11, 1932           MRN: [id number] Visit Date: 09/13/2016              Requested by: Asencion Noble, MD [address] [place], [place] [address] PCP: Asencion Noble, MD   Assessment & Plan: Visit Diagnoses: Advanced tricompartmental degenerative arthrosis both knees   Plan: Long discussion regarding diagnosis and treatment options. [name] would like to proceed with cortisone injection. I injected the right knee today. we'll see her back in 2 weeks and inject the left knee. at some point we may want to consider Visco supplementation.  Follow-Up Instructions: No Follow-up on file.   Orders:  No orders of the defined types were placed in this encounter.  No orders of the defined types were placed in this encounter.     Procedures: Large Joint Inj Date/Time: 09/13/2016 2:36 PM Performed by: Garald Balding Authorized by: Garald Balding   Consent Given by:  Patient Timeout: prior to procedure the correct patient, procedure, and site was verified   Indications:  Pain and joint swelling Location:  Knee Site:  R knee Prep: patient was prepped and draped in usual sterile fashion   Needle Size:  25 G Needle Length:  1.5 inches Approach:  Anteromedial Ultrasound Guidance: No   Fluoroscopic Guidance: No   Arthrogram: No   Medications:  5 mL lidocaine 1 %; 80 mg methylPREDNISolone acetate 40 MG/ML; 3 mL bupivacaine 0.5 % Aspiration Attempted: No   Patient tolerance:  Patient tolerated the procedure well with no immediate complications     Clinical Data: No additional findings.   Subjective: Chief Complaint  Patient presents with  . Right Knee - Pain  . Left Knee - Pain    Pt presents with Bilateral knee pain x 5 years. Pt takes Tylenol due to cardiac issues and is on a blood thinner. Pt states she has trouble walking up and down stairs. Showed pt several isometric exercises to do with her  exercise ball.  [name] complains of both knees being "stiff", sore and "achy". Occasionally she'll feel some swelling in both of her knees. Glucosamine and chondroitin sulfate in the past as well as Tylenol  Review of Systems   Objective: Vital Signs: There were no vitals taken for this visit.  Physical Exam  Ortho Exam [place] very small effusions in both knees. Knees were not hot warm or red. Predominantly medial joint pain bilaterally. Full extension. Flexion about 100. No instability. Venous stasis changes distally without swelling. S1 pulses. Skin was intact. Straight leg raise negative bilaterally. No pain with range of motion of either hip  Specialty Comments:  No specialty comments available.  Imaging: No results found.   PMFS History: Patient Active Problem List   Diagnosis Date Noted  . Lower extremity edema 05/03/2016  . Early satiety 05/07/2014  . Dysphagia, pharyngoesophageal phase 05/07/2014  . Cough 03/09/2014  . Breast cancer of upper-inner quadrant of right female breast ([place]) 06/25/2013  . Bilateral breast cancer ([facility]) 05/09/2013  . Wrist fracture, closed 12/10/2012  . Triquetral fracture 10/29/2012  . Sprain of right wrist 10/29/2012  . Chronic anticoagulation 04/12/2011  . Atrial fibrillation ([facility]) 03/22/2011  . Arteriosclerotic cardiovascular disease (ASCVD)   . Hyperlipidemia   . Polio   . Hypertension   . GERD (gastroesophageal reflux disease)   . HERPES  ZOSTER 11/12/2008  . ANXIETY 07/24/2007  . PERIPHERAL NEUROPATHY 07/24/2007   Past Medical History:  Diagnosis Date  . Anxiety   . Arteriosclerotic cardiovascular disease (ASCVD)     cath Feb '07- no obstructive dz - 20% proximal LAD only; EF 65%; possible coronary artery spasm  . Atrial fibrillation (HCC)    Paroxysmal on event recorder in 2009; regular supraventricular tachycardia at a rate of 150 also identified on that study representing either atrial flutter or PSVT; onset of  persistent AF in 03/2011  . Carcinoma of breast ([place]) 2001   Left mastectomy with positive nodes  . Chest pain    Long-standing and atypical  . Chronic anticoagulation 04/12/2011  . GERD (gastroesophageal reflux disease)    hiatal hernia  . Herpes zoster   . Hyperlipidemia    Lipid profile in 11/2008:282, 176, 64, 201  . Hypertension    diastolic dysfunction; normal CMet and TSH in 11/2009; normal CBC in 04/2010  . Malignant neoplasm of breast (female), unspecified site 05/27/2013   right breast lumpectomy with positive node  . Peripheral neuropathy ([place])   . Polio 1946   at age 55  . Rectal bleed   . Renal insufficiency   . Shortness of breath dyspnea     Family History  Problem Relation Age of Onset  . Cancer Sister     lung  . Cancer Brother     lipoma  . Cancer Brother     kidney    Past Surgical History:  Procedure Laterality Date  . BREAST LUMPECTOMY WITH NEEDLE LOCALIZATION AND AXILLARY SENTINEL LYMPH NODE BX Right 05/27/2013   Procedure: BREAST LUMPECTOMY WITH NEEDLE LOCALIZATION AND AXILLARY SENTINEL LYMPH NODE BX;  Surgeon: Pedro Earls, MD;  Location: [facility];  Service: General;  Laterality: Right;  . BREAST SURGERY    . CHOLECYSTECTOMY  1999  . COLONOSCOPY  02/22/2011   Procedure: COLONOSCOPY;  Surgeon: Rogene Houston, MD;  [facility];  Service: Endoscopy;  Laterality: N/A;; performed for scant hematochezia  . DILATION AND CURETTAGE OF UTERUS    . ESOPHAGOGASTRODUODENOSCOPY  02/22/2011   Procedure: ESOPHAGOGASTRODUODENOSCOPY (EGD);  Surgeon: Rogene Houston, MD;  [facility];  Service: Endoscopy;  Laterality: N/A;  . ESOPHAGOGASTRODUODENOSCOPY N/A 06/02/2014   Procedure: ESOPHAGOGASTRODUODENOSCOPY (EGD);  Surgeon: Rogene Houston, MD;  [facility];  Service: Endoscopy;  Laterality: N/A;  730  . EYE SURGERY     both cataracts  . MALONEY DILATION N/A 06/02/2014   Procedure: Venia Minks DILATION;  Surgeon:  Rogene Houston, MD;  [facility];  Service: Endoscopy;  Laterality: N/A;  . MASTECTOMY  2001   Left;modified radical with lymph node dissection  . PORT-A-CATH REMOVAL     pac in and out 2002   Social History   Occupational History  . homemaker    Social History Main Topics  . Smoking status: Never Smoker  . Smokeless tobacco: Never Used  . Alcohol use No  . Drug use: No  . Sexual activity: Not Currently    Birth control/ protection: Post-menopausal

## 2016-09-26 DIAGNOSIS — H353211 Exudative age-related macular degeneration, right eye, with active choroidal neovascularization: Secondary | ICD-10-CM | POA: Diagnosis not present

## 2016-09-27 ENCOUNTER — Encounter (INDEPENDENT_AMBULATORY_CARE_PROVIDER_SITE_OTHER): Payer: Self-pay | Admitting: Orthopaedic Surgery

## 2016-09-27 ENCOUNTER — Ambulatory Visit (INDEPENDENT_AMBULATORY_CARE_PROVIDER_SITE_OTHER): Payer: PPO | Admitting: Orthopaedic Surgery

## 2016-09-27 VITALS — BP 131/85 | HR 92 | Ht 60.5 in | Wt 184.0 lb

## 2016-09-27 DIAGNOSIS — M25561 Pain in right knee: Secondary | ICD-10-CM | POA: Diagnosis not present

## 2016-09-27 DIAGNOSIS — G8929 Other chronic pain: Secondary | ICD-10-CM

## 2016-09-27 DIAGNOSIS — M25562 Pain in left knee: Secondary | ICD-10-CM

## 2016-09-27 MED ORDER — METHYLPREDNISOLONE ACETATE 40 MG/ML IJ SUSP
80.0000 mg | INTRAMUSCULAR | Status: AC | PRN
  Administered 2016-09-27: 80 mg

## 2016-09-27 MED ORDER — LIDOCAINE HCL 1 % IJ SOLN
5.0000 mL | INTRAMUSCULAR | Status: AC | PRN
  Administered 2016-09-27: 5 mL

## 2016-09-27 MED ORDER — BUPIVACAINE HCL 0.5 % IJ SOLN
3.0000 mL | INTRAMUSCULAR | Status: AC | PRN
  Administered 2016-09-27: 3 mL via INTRA_ARTICULAR

## 2016-09-27 NOTE — Progress Notes (Signed)
Office Visit Note   Patient: [name]           Date of Birth: [birth year]           MRN: [id number] Visit Date: 09/27/2016              Requested by: Asencion Noble, MD [address] [place],  [address] PCP: Asencion Noble, MD   Assessment & Plan: Visit Diagnoses:  1. Chronic pain of both knees   Bilateral knee osteoarthritis injection right knee helped "tremendously" would like to proceed with a cortisone injection left knee  Plan: Cortisone injection left knee, follow-up as needed  Follow-Up Instructions: Return if symptoms worsen or fail to improve.   Orders:  No orders of the defined types were placed in this encounter.  No orders of the defined types were placed in this encounter.     Procedures: Large Joint Inj Date/Time: 09/27/2016 2:27 PM Performed by: Garald Balding Authorized by: Garald Balding   Consent Given by:  Patient Timeout: prior to procedure the correct patient, procedure, and site was verified   Indications:  Pain and joint swelling Location:  Knee Site:  L knee Prep: patient was prepped and draped in usual sterile fashion   Needle Size:  25 G Needle Length:  1.5 inches Approach:  Anteromedial Ultrasound Guidance: No   Fluoroscopic Guidance: No   Arthrogram: No   Medications:  5 mL lidocaine 1 %; 80 mg methylPREDNISolone acetate 40 MG/ML; 3 mL bupivacaine 0.5 % Aspiration Attempted: No   Patient tolerance:  Patient tolerated the procedure well with no immediate complications     Clinical Data: No additional findings.   Subjective: No chief complaint on file. Mrs. Paul was seen 2 weeks ago for evaluation of the osteoarthritis in both knees. She was more symptomatic on the right. Cortisone injection helped "tremendously". She would like to have injection in her left knee today. Her films revealed essentially end-stage osteoarthritis in all 3 compartments both knees  HPI  Review of Systems   Objective: Vital  Signs: There were no vitals taken for this visit.  Physical Exam  Ortho Exam left knee with minimal effusion. Mild anteromedial joint pain. Full extension and flexion over 100 without instability. No calf pain. Multiple varicosities about her right left knee without pain  Specialty Comments:  No specialty comments available.  Imaging: No results found.   PMFS History: Patient Active Problem List   Diagnosis Date Noted  . Lower extremity edema 05/03/2016  . Early satiety 05/07/2014  . Dysphagia, pharyngoesophageal phase 05/07/2014  . Cough 03/09/2014  . Breast cancer of upper-inner quadrant of right female breast ([facility]) 06/25/2013  . Bilateral breast cancer ([facility] [place]) 05/09/2013  . Wrist fracture, closed 12/10/2012  . Triquetral fracture 10/29/2012  . Sprain of right wrist 10/29/2012  . Chronic anticoagulation 04/12/2011  . Atrial fibrillation ([place]) 03/22/2011  . Arteriosclerotic cardiovascular disease (ASCVD)   . Hyperlipidemia   . Polio   . Hypertension   . GERD (gastroesophageal reflux disease)   . HERPES ZOSTER 11/12/2008  . ANXIETY 07/24/2007  . PERIPHERAL NEUROPATHY 07/24/2007   Past Medical History:  Diagnosis Date  . Anxiety   . Arteriosclerotic cardiovascular disease (ASCVD)     cath Feb '07- no obstructive dz - 20% proximal LAD only; EF 65%; possible coronary artery spasm  . Atrial fibrillation (HCC)    Paroxysmal on event recorder in 2009; regular supraventricular tachycardia at a rate of 150 also identified on that  study representing either atrial flutter or PSVT; onset of persistent AF in 03/2011  . Carcinoma of breast ([facility]) 2001   Left mastectomy with positive nodes  . Chest pain    Long-standing and atypical  . Chronic anticoagulation 04/12/2011  . GERD (gastroesophageal reflux disease)    hiatal hernia  . Herpes zoster   . Hyperlipidemia    Lipid profile in 11/2008:282, 176, 64, 201  . Hypertension    diastolic dysfunction; normal CMet and TSH in 11/2009;  normal CBC in 04/2010  . Malignant neoplasm of breast (female), unspecified site 05/27/2013   right breast lumpectomy with positive node  . Peripheral neuropathy ([facility])   . Polio 1946   at age 83  . Rectal bleed   . Renal insufficiency   . Shortness of breath dyspnea     Family History  Problem Relation Age of Onset  . Cancer Sister     lung  . Cancer Brother     lipoma  . Cancer Brother     kidney    Past Surgical History:  Procedure Laterality Date  . BREAST LUMPECTOMY WITH NEEDLE LOCALIZATION AND AXILLARY SENTINEL LYMPH NODE BX Right 05/27/2013   Procedure: BREAST LUMPECTOMY WITH NEEDLE LOCALIZATION AND AXILLARY SENTINEL LYMPH NODE BX;  Surgeon: Pedro Earls, MD;  Location: [facility];  Service: General;  Laterality: Right;  . BREAST SURGERY    . CHOLECYSTECTOMY  1999  . COLONOSCOPY  02/22/2011   Procedure: COLONOSCOPY;  Surgeon: Rogene Houston, MD;  [facility];  Service: Endoscopy;  Laterality: N/A;; performed for scant hematochezia  . DILATION AND CURETTAGE OF UTERUS    . ESOPHAGOGASTRODUODENOSCOPY  02/22/2011   Procedure: ESOPHAGOGASTRODUODENOSCOPY (EGD);  Surgeon: Rogene Houston, MD;  [facility];  Service: Endoscopy;  Laterality: N/A;  . ESOPHAGOGASTRODUODENOSCOPY N/A 06/02/2014   Procedure: ESOPHAGOGASTRODUODENOSCOPY (EGD);  Surgeon: Rogene Houston, MD;  [facility];  Service: Endoscopy;  Laterality: N/A;  730  . EYE SURGERY     both cataracts  . MALONEY DILATION N/A 06/02/2014   Procedure: Venia Minks DILATION;  Surgeon: Rogene Houston, MD;  [facility];  Service: Endoscopy;  Laterality: N/A;  . MASTECTOMY  2001   Left;modified radical with lymph node dissection  . PORT-A-CATH REMOVAL     pac in and out 2002   Social History   Occupational History  . homemaker    Social History Main Topics  . Smoking status: Never Smoker  . Smokeless tobacco: Never Used  . Alcohol use No  . Drug use: No  .  Sexual activity: Not Currently    Birth control/ protection: Post-menopausal

## 2016-10-18 ENCOUNTER — Other Ambulatory Visit: Payer: Self-pay | Admitting: Oncology

## 2016-10-18 ENCOUNTER — Other Ambulatory Visit: Payer: Self-pay | Admitting: *Deleted

## 2016-10-18 DIAGNOSIS — C50211 Malignant neoplasm of upper-inner quadrant of right female breast: Secondary | ICD-10-CM

## 2016-10-18 DIAGNOSIS — C50912 Malignant neoplasm of unspecified site of left female breast: Secondary | ICD-10-CM

## 2016-10-18 DIAGNOSIS — M8580 Other specified disorders of bone density and structure, unspecified site: Secondary | ICD-10-CM

## 2016-10-18 DIAGNOSIS — Z170 Estrogen receptor positive status [ER+]: Secondary | ICD-10-CM

## 2016-10-18 DIAGNOSIS — C50911 Malignant neoplasm of unspecified site of right female breast: Secondary | ICD-10-CM

## 2016-10-26 ENCOUNTER — Other Ambulatory Visit (HOSPITAL_COMMUNITY): Payer: PPO

## 2016-10-26 DIAGNOSIS — R05 Cough: Secondary | ICD-10-CM | POA: Diagnosis not present

## 2016-10-30 ENCOUNTER — Ambulatory Visit (HOSPITAL_COMMUNITY)
Admission: RE | Admit: 2016-10-30 | Discharge: 2016-10-30 | Disposition: A | Discharge status: Home / Self Care | Payer: PPO | Source: Ambulatory Visit | Attending: Oncology | Admitting: Oncology

## 2016-10-30 DIAGNOSIS — M8580 Other specified disorders of bone density and structure, unspecified site: Secondary | ICD-10-CM

## 2016-10-30 DIAGNOSIS — Z170 Estrogen receptor positive status [ER+]: Secondary | ICD-10-CM | POA: Insufficient documentation

## 2016-10-30 DIAGNOSIS — C50211 Malignant neoplasm of upper-inner quadrant of right female breast: Secondary | ICD-10-CM | POA: Diagnosis not present

## 2016-10-30 DIAGNOSIS — C50912 Malignant neoplasm of unspecified site of left female breast: Secondary | ICD-10-CM | POA: Diagnosis not present

## 2016-10-30 DIAGNOSIS — M85832 Other specified disorders of bone density and structure, left forearm: Secondary | ICD-10-CM | POA: Insufficient documentation

## 2016-10-30 DIAGNOSIS — C50911 Malignant neoplasm of unspecified site of right female breast: Secondary | ICD-10-CM

## 2016-11-02 DIAGNOSIS — H353111 Nonexudative age-related macular degeneration, right eye, early dry stage: Secondary | ICD-10-CM | POA: Diagnosis not present

## 2016-11-02 DIAGNOSIS — H353211 Exudative age-related macular degeneration, right eye, with active choroidal neovascularization: Secondary | ICD-10-CM | POA: Diagnosis not present

## 2016-11-15 ENCOUNTER — Other Ambulatory Visit: Payer: Self-pay

## 2016-11-15 ENCOUNTER — Telehealth: Payer: Self-pay | Admitting: Internal Medicine

## 2016-11-15 MED ORDER — CARVEDILOL 12.5 MG PO TABS: 12.5000 mg | ORAL_TABLET | Freq: Two times a day (BID) | ORAL | 3 refills | Status: DC

## 2016-11-15 MED ORDER — LOSARTAN POTASSIUM 25 MG PO TABS: 25.0000 mg | ORAL_TABLET | Freq: Two times a day (BID) | ORAL | 3 refills | Status: DC

## 2016-11-15 MED ORDER — APIXABAN 2.5 MG PO TABS: ORAL_TABLET | ORAL | 3 refills | Status: DC

## 2016-11-15 NOTE — Telephone Encounter (Signed)
Spoke with pt, she wanted to know the status of her pt assistance. I told her I will call to check on it today. She also wanted 90 day refills on medications so it would be less visits to pharmacy.

## 2016-11-15 NOTE — Telephone Encounter (Signed)
Pt lvm stating she has not received the medication she signed for.

## 2016-11-16 NOTE — Telephone Encounter (Signed)
[place] yesterday and today. Placed on hold for a long duration of time multiple times through out the day.

## 2016-11-27 ENCOUNTER — Telehealth: Payer: Self-pay | Admitting: Internal Medicine

## 2016-11-27 NOTE — Telephone Encounter (Signed)
New message    Catalina Antigua from [facility], is calling for allergy information on pt.

## 2016-11-28 ENCOUNTER — Telehealth: Payer: Self-pay | Admitting: Internal Medicine

## 2016-11-28 NOTE — Telephone Encounter (Signed)
Matt calling from [facility] to confirm allergy information for patient. Please fax info to [phone]. Matt did not leave number, states that they have too many calls and would take a long time to get through.Thanks.

## 2016-11-28 NOTE — Telephone Encounter (Signed)
Faxed [facility] med and allergy list on 5/21 then called to confirm list was received and was on hold for 61 mins. Called back today and was on hold for 45 mins.

## 2016-11-29 NOTE — Telephone Encounter (Signed)
Called, spoke with pt. (Call note did not give return phone number of caller, and I did not see this pharmacy listed for pt, so I wanted to call pt to verify this information). Pt verified she recently filled out a form for patient assistance from [employer].    Medication list sent to medical records to be faxed to [facility] [phone].   Pt had questions r/t application. I will forward to Patient Care Advocate department to check on status of application.

## 2016-11-29 NOTE — Telephone Encounter (Signed)
Med list with attached allergies faxed to [phone] Attn: [facility]

## 2016-11-29 NOTE — Telephone Encounter (Signed)
This pt is seen in our [place] office.  Will forward to nurses in the [place] office to address Pt assistance question.

## 2016-12-02 ENCOUNTER — Other Ambulatory Visit: Payer: Self-pay | Admitting: Internal Medicine

## 2016-12-07 ENCOUNTER — Other Ambulatory Visit: Payer: Self-pay | Admitting: Oncology

## 2016-12-07 DIAGNOSIS — H353121 Nonexudative age-related macular degeneration, left eye, early dry stage: Secondary | ICD-10-CM | POA: Diagnosis not present

## 2016-12-07 DIAGNOSIS — C50919 Malignant neoplasm of unspecified site of unspecified female breast: Secondary | ICD-10-CM

## 2016-12-07 DIAGNOSIS — H35721 Serous detachment of retinal pigment epithelium, right eye: Secondary | ICD-10-CM | POA: Diagnosis not present

## 2016-12-07 DIAGNOSIS — H353111 Nonexudative age-related macular degeneration, right eye, early dry stage: Secondary | ICD-10-CM | POA: Diagnosis not present

## 2016-12-07 DIAGNOSIS — H401111 Primary open-angle glaucoma, right eye, mild stage: Secondary | ICD-10-CM | POA: Diagnosis not present

## 2016-12-07 DIAGNOSIS — Z170 Estrogen receptor positive status [ER+]: Principal | ICD-10-CM

## 2016-12-07 DIAGNOSIS — H353212 Exudative age-related macular degeneration, right eye, with inactive choroidal neovascularization: Secondary | ICD-10-CM | POA: Diagnosis not present

## 2016-12-08 ENCOUNTER — Telehealth: Payer: Self-pay | Admitting: Pharmacist

## 2016-12-08 MED ORDER — APIXABAN 5 MG PO TABS: 5.0000 mg | ORAL_TABLET | Freq: Two times a day (BID) | ORAL | 11 refills | Status: DC

## 2016-12-08 NOTE — Telephone Encounter (Signed)
-----   Message from Evans Lance, MD sent at 12/07/2016  8:35 PM EDT ----- Regarding: RE: Eliquis dosing I do not recall why she is on the lower dose. I would be ok with you trying her on the increased dose unless she can tell you differently. GT ----- Message ----- From: Leeroy Bock, [facility] Sent: 12/05/2016  11:19 AM To: Evans Lance, MD Subject: Eliquis dosing                                 Dr Lovena Le,  Ms Matson has been taking Eliquis 2.5mg  BID since she was switched from Toco in January 2015. She qualifies for the 5mg  BID dosing based on her normal SCr and weight. It looks like you had previously discussed switch from Pradaxa to Eliquis and Dr Waymon Budge was involved at some point (08/01/13 phone note). I wanted to touch base with you to see if there was a reason you'd like her to continue the lower dose of Eliquis or if you'd like [place] to increase her dose to 5mg  BID.  Thanks, [employer]

## 2016-12-08 NOTE — Telephone Encounter (Signed)
Spoke with pt to explain the need for higher Eliquis dosing. Refill sent in for Eliquis 5mg  BID. Pt also in the process of getting patient assistance on Eliquis for when she is in the donut hole. Will forward dose update to RN who is helping with paperwork.

## 2016-12-12 DIAGNOSIS — I10 Essential (primary) hypertension: Secondary | ICD-10-CM | POA: Diagnosis not present

## 2016-12-12 DIAGNOSIS — I482 Chronic atrial fibrillation: Secondary | ICD-10-CM | POA: Diagnosis not present

## 2016-12-12 DIAGNOSIS — I5022 Chronic systolic (congestive) heart failure: Secondary | ICD-10-CM | POA: Diagnosis not present

## 2016-12-13 ENCOUNTER — Encounter (INDEPENDENT_AMBULATORY_CARE_PROVIDER_SITE_OTHER): Payer: Self-pay | Admitting: Orthopaedic Surgery

## 2016-12-13 ENCOUNTER — Ambulatory Visit (INDEPENDENT_AMBULATORY_CARE_PROVIDER_SITE_OTHER): Payer: PPO | Admitting: Orthopaedic Surgery

## 2016-12-13 ENCOUNTER — Ambulatory Visit (INDEPENDENT_AMBULATORY_CARE_PROVIDER_SITE_OTHER): Payer: PPO

## 2016-12-13 VITALS — BP 120/62 | HR 88 | Ht 60.0 in | Wt 182.0 lb

## 2016-12-13 DIAGNOSIS — M25552 Pain in left hip: Secondary | ICD-10-CM | POA: Diagnosis not present

## 2016-12-13 DIAGNOSIS — M5440 Lumbago with sciatica, unspecified side: Secondary | ICD-10-CM

## 2016-12-13 MED ORDER — METHYLPREDNISOLONE ACETATE 40 MG/ML IJ SUSP
80.0000 mg | INTRAMUSCULAR | Status: AC | PRN
  Administered 2016-12-13: 80 mg

## 2016-12-13 MED ORDER — LIDOCAINE HCL 1 % IJ SOLN
5.0000 mL | INTRAMUSCULAR | Status: AC | PRN
  Administered 2016-12-13: 5 mL

## 2016-12-13 NOTE — Progress Notes (Signed)
Office Visit Note   Patient: [name]           Date of Birth: [birth year]           MRN: [id number] Visit Date: 12/13/2016              Requested by: Asencion Noble, MD [address] [place], [place] [address] PCP: Asencion Noble, MD   Assessment & Plan: Visit Diagnoses:  1. Low back pain with sciatica, sciatica laterality unspecified, unspecified back pain laterality, unspecified chronicity   Several possible etiologies for left hip pain including that referred from lumbar spine, early arthritis in the left hip and greater trochanteric bursitis  Plan: Cortisone injection over area of greatest tenderness level of greater trochanter left hip. Office 2-3 weeks and monitor her response. Consider MRI scan of lumbar spine if no improvement  Follow-Up Instructions: No Follow-up on file.   Orders:  Orders Placed This Encounter  Procedures  . XR Lumbar Spine 2-3 Views  . XR Pelvis 1-2 Views   No orders of the defined types were placed in this encounter.     Procedures: Large Joint Inj Date/Time: 12/13/2016 12:04 PM Performed by: Garald Balding Authorized by: Garald Balding   Consent Given by:  Patient Timeout: prior to procedure the correct patient, procedure, and site was verified   Indications:  Pain Location:  Hip Site:  L greater trochanter Prep: patient was prepped and draped in usual sterile fashion   Needle Size:  22 G Needle Length:  3.5 inches Approach:  Lateral Ultrasound Guidance: No   Fluoroscopic Guidance: No   Arthrogram: No   Medications:  5 mL lidocaine 1 %; 80 mg methylPREDNISolone acetate 40 MG/ML Aspiration Attempted: No   Patient tolerance:  Patient tolerated the procedure well with no immediate complications     Clinical Data: No additional findings.   Subjective: Chief Complaint  Patient presents with  . Lower Back - Pain    [name] is n 83 y o that presents with Left sided low back pain that radiates into her hip and  buttock area.  [name] experienced insidious onset of left hip pain over period of several weeks. She denies any history of injury or trauma. She has experienced some back pain and left buttock discomfort. Pain will be referred to the lateral aspect of her left hip in the anterior lateral aspect of her left thigh. Not experiencing much groin pain. No numbness or tingling distally. Has a history of arthritis in both knees status post injections with good relief.  HPI  Review of Systems   Objective: Vital Signs: BP 120/62   Pulse 88   Ht 5' (1.524 m)   Wt 182 lb (82.6 kg)   BMI 35.54 kg/m   Physical Exam  Ortho Exam straight leg raise negative bilaterally. Deep tendon reflexes symmetrical. Mild nonpitting edema both ankles. Stasis changes both ankles. +1 pulses. Mild effusion both knees but without localized tenderness. Specific tenderness over the greater trochanter left hip. No pain with internal/external rotation of the groin  Specialty Comments:  No specialty comments available.  Imaging: Xr Lumbar Spine 2-3 Views  Result Date: 12/13/2016 Films of the lumbar spine were obtained in 2 projections. Abundant bowel gas. Some sclerosis about the sacroiliac joints. Joint degenerative changes at L4-5 and L5-S1. Mild left sided degenerative scoliosis. Lateral films reveal near complete collapse of the disc space at L5-S1 with peripheral osteophytes. Slight anterior listhesis of L4 on 5.  Diffuse calcification of the abdominal aorta  Xr Pelvis 1-2 Views  Result Date: 12/13/2016 AP of the pelvis demonstrate some mild degenerative changes of both hips. Slightly more on the left compared to the right. Joint space still maintained. Peripheral osteophyte right spot the acetabulum. Supple irregularity along the greater trochanters bilaterally. No ectopic calcification    PMFS History: Patient Active Problem List   Diagnosis Date Noted  . Lower extremity edema 05/03/2016  . Early satiety  05/07/2014  . Dysphagia, pharyngoesophageal phase 05/07/2014  . Cough 03/09/2014  . Breast cancer of upper-inner quadrant of right female breast ([place]) 06/25/2013  . Bilateral breast cancer ([place]) 05/09/2013  . Wrist fracture, closed 12/10/2012  . Triquetral fracture 10/29/2012  . Sprain of right wrist 10/29/2012  . Chronic anticoagulation 04/12/2011  . Atrial fibrillation ([place]) 03/22/2011  . Arteriosclerotic cardiovascular disease (ASCVD)   . Hyperlipidemia   . Polio   . Hypertension   . GERD (gastroesophageal reflux disease)   . HERPES ZOSTER 11/12/2008  . ANXIETY 07/24/2007  . PERIPHERAL NEUROPATHY 07/24/2007   Past Medical History:  Diagnosis Date  . Anxiety   . Arteriosclerotic cardiovascular disease (ASCVD)     cath Feb '07- no obstructive dz - 20% proximal LAD only; EF 65%; possible coronary artery spasm  . Atrial fibrillation (HCC)    Paroxysmal on event recorder in 2009; regular supraventricular tachycardia at a rate of 150 also identified on that study representing either atrial flutter or PSVT; onset of persistent AF in 03/2011  . Carcinoma of breast ([place]) 2001   Left mastectomy with positive nodes  . Chest pain    Long-standing and atypical  . Chronic anticoagulation 04/12/2011  . GERD (gastroesophageal reflux disease)    hiatal hernia  . Herpes zoster   . Hyperlipidemia    Lipid profile in 11/2008:282, 176, 64, 201  . Hypertension    diastolic dysfunction; normal CMet and TSH in 11/2009; normal CBC in 04/2010  . Malignant neoplasm of breast (female), unspecified site 05/27/2013   right breast lumpectomy with positive node  . Peripheral neuropathy   . Polio 1946   at age 6  . Rectal bleed   . Renal insufficiency   . Shortness of breath dyspnea     Family History  Problem Relation Age of Onset  . Cancer Sister        lung  . Cancer Brother        lipoma  . Cancer Brother        kidney    Past Surgical History:  Procedure Laterality Date  . BREAST  LUMPECTOMY WITH NEEDLE LOCALIZATION AND AXILLARY SENTINEL LYMPH NODE BX Right 05/27/2013   Procedure: BREAST LUMPECTOMY WITH NEEDLE LOCALIZATION AND AXILLARY SENTINEL LYMPH NODE BX;  Surgeon: Pedro Earls, MD;  Location: [facility];  Service: General;  Laterality: Right;  . BREAST SURGERY    . CHOLECYSTECTOMY  1999  . COLONOSCOPY  02/22/2011   Procedure: COLONOSCOPY;  Surgeon: Rogene Houston, MD;  [facility];  Service: Endoscopy;  Laterality: N/A;; performed for scant hematochezia  . DILATION AND CURETTAGE OF UTERUS    . ESOPHAGOGASTRODUODENOSCOPY  02/22/2011   Procedure: ESOPHAGOGASTRODUODENOSCOPY (EGD);  Surgeon: Rogene Houston, MD;  [facility];  Service: Endoscopy;  Laterality: N/A;  . ESOPHAGOGASTRODUODENOSCOPY N/A 06/02/2014   Procedure: ESOPHAGOGASTRODUODENOSCOPY (EGD);  Surgeon: Rogene Houston, MD;  [facility];  Service: Endoscopy;  Laterality: N/A;  730  . EYE SURGERY  both cataracts  . MALONEY DILATION N/A 06/02/2014   Procedure: Venia Minks DILATION;  Surgeon: Rogene Houston, MD;  [facility];  Service: Endoscopy;  Laterality: N/A;  . MASTECTOMY  2001   Left;modified radical with lymph node dissection  . PORT-A-CATH REMOVAL     pac in and out 2002   Social History   Occupational History  . homemaker    Social History Main Topics  . Smoking status: Never Smoker  . Smokeless tobacco: Never Used  . Alcohol use No  . Drug use: No  . Sexual activity: Not Currently    Birth control/ protection: Post-menopausal     Garald Balding, MD   Note - This record has been created using [employer].  Chart creation errors have been sought, but may not always  have been located. Such creation errors do not reflect on  the standard of medical care.

## 2016-12-15 NOTE — Telephone Encounter (Signed)
New Rx faxed to [facility]

## 2017-01-04 DIAGNOSIS — H353122 Nonexudative age-related macular degeneration, left eye, intermediate dry stage: Secondary | ICD-10-CM | POA: Diagnosis not present

## 2017-01-04 DIAGNOSIS — H35721 Serous detachment of retinal pigment epithelium, right eye: Secondary | ICD-10-CM | POA: Diagnosis not present

## 2017-01-04 DIAGNOSIS — H401112 Primary open-angle glaucoma, right eye, moderate stage: Secondary | ICD-10-CM | POA: Diagnosis not present

## 2017-01-04 DIAGNOSIS — H353114 Nonexudative age-related macular degeneration, right eye, advanced atrophic with subfoveal involvement: Secondary | ICD-10-CM | POA: Diagnosis not present

## 2017-01-04 DIAGNOSIS — H353212 Exudative age-related macular degeneration, right eye, with inactive choroidal neovascularization: Secondary | ICD-10-CM | POA: Diagnosis not present

## 2017-01-19 DIAGNOSIS — H401112 Primary open-angle glaucoma, right eye, moderate stage: Secondary | ICD-10-CM | POA: Diagnosis not present

## 2017-01-19 DIAGNOSIS — H353211 Exudative age-related macular degeneration, right eye, with active choroidal neovascularization: Secondary | ICD-10-CM | POA: Diagnosis not present

## 2017-01-23 DIAGNOSIS — H35721 Serous detachment of retinal pigment epithelium, right eye: Secondary | ICD-10-CM | POA: Diagnosis not present

## 2017-01-23 DIAGNOSIS — H353211 Exudative age-related macular degeneration, right eye, with active choroidal neovascularization: Secondary | ICD-10-CM | POA: Diagnosis not present

## 2017-01-23 DIAGNOSIS — H353114 Nonexudative age-related macular degeneration, right eye, advanced atrophic with subfoveal involvement: Secondary | ICD-10-CM | POA: Diagnosis not present

## 2017-01-23 DIAGNOSIS — H353122 Nonexudative age-related macular degeneration, left eye, intermediate dry stage: Secondary | ICD-10-CM | POA: Diagnosis not present

## 2017-01-25 DIAGNOSIS — N390 Urinary tract infection, site not specified: Secondary | ICD-10-CM | POA: Diagnosis not present

## 2017-02-01 ENCOUNTER — Ambulatory Visit (INDEPENDENT_AMBULATORY_CARE_PROVIDER_SITE_OTHER): Payer: PPO | Admitting: Internal Medicine

## 2017-02-01 ENCOUNTER — Other Ambulatory Visit: Payer: Self-pay | Admitting: Nurse Practitioner

## 2017-02-01 ENCOUNTER — Encounter: Payer: Self-pay | Admitting: Internal Medicine

## 2017-02-01 VITALS — BP 130/86 | HR 72 | Ht 60.0 in | Wt 181.0 lb

## 2017-02-01 DIAGNOSIS — I4891 Unspecified atrial fibrillation: Secondary | ICD-10-CM

## 2017-02-01 DIAGNOSIS — I10 Essential (primary) hypertension: Secondary | ICD-10-CM | POA: Diagnosis not present

## 2017-02-01 DIAGNOSIS — I5022 Chronic systolic (congestive) heart failure: Secondary | ICD-10-CM

## 2017-02-01 NOTE — Progress Notes (Signed)
HPI [name] returns today for followup. She is a very pleasant 83 year old woman with a history of paroxysmal atrial fibrillation which has become persistent/chronic. She was on flecainide for many years but eventually, the flecainide became ineffective.  She was initially placed on amio but did not maintain NSR and we have gone to a strategy of rate control. A repeat echo demonstrated that her EF has gone down to 40%. A holter demonstrated no RVR of significance, NSVT, and no pauses. She has had minimal edema and notes that at times her BP is low. Allergies  Allergen Reactions  . Atorvastatin Other (See Comments)    Myalgias  . Hydrocodone Other (See Comments)    GI distress.     Current Outpatient Prescriptions  Medication Sig Dispense Refill  . ALPRAZolam (XANAX) 0.25 MG tablet 0.25 mg. TAKE 1/2 TABLET AS NEEDED    . apixaban (ELIQUIS) 5 MG TABS tablet Take 1 tablet (5 mg total) by mouth 2 (two) times daily. 60 tablet 11  . carvedilol (COREG) 12.5 MG tablet Take 1 tablet (12.5 mg total) by mouth 2 (two) times daily. 180 tablet 3  . COMBIGAN 0.2-0.5 % ophthalmic solution     . exemestane (AROMASIN) 25 MG tablet TAKE 1 TABLET EACH MORNING AFTER BREAKFAST TO PREVENT BREAST CANCER. 90 tablet 0  . furosemide (LASIX) 40 MG tablet Take 1 tablet (40 mg total) by mouth daily. (Patient taking differently: Take 40 mg by mouth daily. Pt cut her dose to 20 mg daily) 90 tablet 3  . losartan (COZAAR) 25 MG tablet Take 1 tablet (25 mg total) by mouth 2 (two) times daily. 90 tablet 3  . pantoprazole (PROTONIX) 40 MG tablet Take 1 tablet (40 mg total) by mouth daily before breakfast. 30 tablet 5   No current facility-administered medications for this visit.      Past Medical History:  Diagnosis Date  . Anxiety   . Arteriosclerotic cardiovascular disease (ASCVD)     cath Feb '07- no obstructive dz - 20% proximal LAD only; EF 65%; possible coronary artery spasm  . Atrial fibrillation  (HCC)    Paroxysmal on event recorder in 2009; regular supraventricular tachycardia at a rate of 150 also identified on that study representing either atrial flutter or PSVT; onset of persistent AF in 03/2011  . Carcinoma of breast ([place]) 2001   Left mastectomy with positive nodes  . Chest pain    Long-standing and atypical  . Chronic anticoagulation 04/12/2011  . GERD (gastroesophageal reflux disease)    hiatal hernia  . Herpes zoster   . Hyperlipidemia    Lipid profile in 11/2008:282, 176, 64, 201  . Hypertension    diastolic dysfunction; normal CMet and TSH in 11/2009; normal CBC in 04/2010  . Malignant neoplasm of breast (female), unspecified site 05/27/2013   right breast lumpectomy with positive node  . Peripheral neuropathy   . Polio 1946   at age 65  . Rectal bleed   . Renal insufficiency   . Shortness of breath dyspnea     ROS:   All systems reviewed and negative except as noted in the HPI.   Past Surgical History:  Procedure Laterality Date  . BREAST LUMPECTOMY WITH NEEDLE LOCALIZATION AND AXILLARY SENTINEL LYMPH NODE BX Right 05/27/2013   Procedure: BREAST LUMPECTOMY WITH NEEDLE LOCALIZATION AND AXILLARY SENTINEL LYMPH NODE BX;  Surgeon: Pedro Earls, MD;  Location: [facility];  Service: General;  Laterality: Right;  .  BREAST SURGERY    . CHOLECYSTECTOMY  1999  . COLONOSCOPY  02/22/2011   Procedure: COLONOSCOPY;  Surgeon: Rogene Houston, MD;  [facility];  Service: Endoscopy;  Laterality: N/A;; performed for scant hematochezia  . DILATION AND CURETTAGE OF UTERUS    . ESOPHAGOGASTRODUODENOSCOPY  02/22/2011   Procedure: ESOPHAGOGASTRODUODENOSCOPY (EGD);  Surgeon: Rogene Houston, MD;  [facility];  Service: Endoscopy;  Laterality: N/A;  . ESOPHAGOGASTRODUODENOSCOPY N/A 06/02/2014   Procedure: ESOPHAGOGASTRODUODENOSCOPY (EGD);  Surgeon: Rogene Houston, MD;  [facility];  Service: Endoscopy;  Laterality: N/A;  730  .  EYE SURGERY     both cataracts  . MALONEY DILATION N/A 06/02/2014   Procedure: Venia Minks DILATION;  Surgeon: Rogene Houston, MD;  [facility];  Service: Endoscopy;  Laterality: N/A;  . MASTECTOMY  2001   Left;modified radical with lymph node dissection  . PORT-A-CATH REMOVAL     pac in and out 2002     Family History  Problem Relation Age of Onset  . Cancer Sister        lung  . Cancer Brother        lipoma  . Cancer Brother        kidney     Social History   Social History  . Marital status: Widowed    Spouse name: N/A  . Number of children: 2  . Years of education: N/A   Occupational History  . homemaker    Social History Main Topics  . Smoking status: Never Smoker  . Smokeless tobacco: Never Used  . Alcohol use No  . Drug use: No  . Sexual activity: Not Currently    Birth control/ protection: Post-menopausal   Other Topics Concern  . Not on file   Social History Narrative  . No narrative on file     BP 130/86   Pulse 72   Ht 5' (1.524 m)   Wt 181 lb (82.1 kg)   SpO2 92%   BMI 35.35 kg/m   Physical Exam:  Non-ill appearing 83 year old woman,NAD HEENT: Unremarkable Neck:  6 cm JVD, no thyromegally Back:  No CVA tenderness Lungs:  Scattered wheezes and rales, no rhonchi HEART:  IRegular rate rhythm, no murmurs, no rubs, no clicks Abd:  soft, obese, positive bowel sounds, no organomegally, no rebound, no guarding Ext:  2 plus pulses, no edema, no cyanosis, no clubbing Skin:  No rashes no nodules Neuro:  CN II through XII intact, motor grossly intact   Assess/Plan: 1. Chronic atrial fib - at this point she remains out of rhythm and I would anticipate continuing a strategy of rate control.  2. HTN - her blood pressure is under good control. She will continue her current dose of coreg. At times her BP has been low and I have asked her to reduce her coreg by half when her SBP is below 110. 3. Coags - she will remain on her Eliquis 4.  Worsening systolic heart failure - acute on chronic - Her symptoms are better. She is bothered by some dizziness. I have askedher to continue her current meds but if she has low bp, below 110, she is to reduce her coreg in half.  Mikle Bosworth.D.

## 2017-02-01 NOTE — Patient Instructions (Signed)
Medication Instructions:  Your physician has recommended you make the following change in your medication: You may Decrease Carvedilol to 1/2 tablet if Blood Pressure is below 110.    Labwork: NONE  Testing/Procedures: NONE  Follow-Up: Your physician wants you to follow-up in: 1 Year with Dr. Lovena Le.  You will receive a reminder letter in the mail two months in advance. If you don't receive a letter, please call our office to schedule the follow-up appointment.   Any Other Special Instructions Will Be Listed Below (If Applicable).     If you need a refill on your cardiac medications before your next appointment, please call your pharmacy. Thank you for choosing [facility]!

## 2017-02-19 ENCOUNTER — Other Ambulatory Visit: Payer: Self-pay | Admitting: Oncology

## 2017-02-19 DIAGNOSIS — C50919 Malignant neoplasm of unspecified site of unspecified female breast: Secondary | ICD-10-CM

## 2017-02-19 DIAGNOSIS — Z170 Estrogen receptor positive status [ER+]: Principal | ICD-10-CM

## 2017-02-21 ENCOUNTER — Telehealth: Payer: Self-pay | Admitting: *Deleted

## 2017-02-21 NOTE — Telephone Encounter (Signed)
Patient walked into office requesting samples of Eliquis. 2 Boxes of Eliquis placed at front desk for pick up. Lot # [id number] EXP: 12/20

## 2017-02-27 DIAGNOSIS — H353114 Nonexudative age-related macular degeneration, right eye, advanced atrophic with subfoveal involvement: Secondary | ICD-10-CM | POA: Diagnosis not present

## 2017-02-27 DIAGNOSIS — H401112 Primary open-angle glaucoma, right eye, moderate stage: Secondary | ICD-10-CM | POA: Diagnosis not present

## 2017-02-27 DIAGNOSIS — H353113 Nonexudative age-related macular degeneration, right eye, advanced atrophic without subfoveal involvement: Secondary | ICD-10-CM | POA: Diagnosis not present

## 2017-02-27 DIAGNOSIS — H353211 Exudative age-related macular degeneration, right eye, with active choroidal neovascularization: Secondary | ICD-10-CM | POA: Diagnosis not present

## 2017-02-27 DIAGNOSIS — H43811 Vitreous degeneration, right eye: Secondary | ICD-10-CM | POA: Diagnosis not present

## 2017-02-27 DIAGNOSIS — H35721 Serous detachment of retinal pigment epithelium, right eye: Secondary | ICD-10-CM | POA: Diagnosis not present

## 2017-03-05 ENCOUNTER — Encounter: Payer: Self-pay | Admitting: Oncology

## 2017-03-05 NOTE — Progress Notes (Signed)
Called pt to talk about reapplying for copay assistance for Aromasin w/ the Patient [name].  Pt informed me she called the foundation today and did an application over the phone and they would be sending the physician form to be signed.  I verbalized understanding and told her once I receive the form I will get Dr. Jana Hakim to sign it and fax it back to the foundation.  She appreciated my help.

## 2017-03-07 ENCOUNTER — Encounter (INDEPENDENT_AMBULATORY_CARE_PROVIDER_SITE_OTHER): Payer: Self-pay | Admitting: Orthopaedic Surgery

## 2017-03-07 ENCOUNTER — Ambulatory Visit (INDEPENDENT_AMBULATORY_CARE_PROVIDER_SITE_OTHER): Payer: PPO | Admitting: Urology

## 2017-03-07 ENCOUNTER — Ambulatory Visit (INDEPENDENT_AMBULATORY_CARE_PROVIDER_SITE_OTHER): Payer: PPO | Admitting: Orthopaedic Surgery

## 2017-03-07 VITALS — BP 116/87 | HR 94 | Ht 61.0 in | Wt 185.0 lb

## 2017-03-07 DIAGNOSIS — M25552 Pain in left hip: Secondary | ICD-10-CM

## 2017-03-07 DIAGNOSIS — R3912 Poor urinary stream: Secondary | ICD-10-CM

## 2017-03-07 DIAGNOSIS — M1711 Unilateral primary osteoarthritis, right knee: Secondary | ICD-10-CM

## 2017-03-07 MED ORDER — METHYLPREDNISOLONE ACETATE 40 MG/ML IJ SUSP
40.0000 mg | INTRAMUSCULAR | Status: AC | PRN
  Administered 2017-03-07: 40 mg via INTRA_ARTICULAR

## 2017-03-07 MED ORDER — LIDOCAINE HCL 1 % IJ SOLN
5.0000 mL | INTRAMUSCULAR | Status: AC | PRN
Start: 2017-03-07 — End: 2017-03-07
  Administered 2017-03-07: 5 mL

## 2017-03-07 MED ORDER — BUPIVACAINE HCL 0.5 % IJ SOLN
3.0000 mL | INTRAMUSCULAR | Status: AC | PRN
  Administered 2017-03-07: 3 mL via INTRA_ARTICULAR

## 2017-03-07 MED ORDER — LIDOCAINE HCL 1 % IJ SOLN
5.0000 mL | INTRAMUSCULAR | Status: AC | PRN
  Administered 2017-03-07: 5 mL

## 2017-03-07 MED ORDER — METHYLPREDNISOLONE ACETATE 40 MG/ML IJ SUSP
80.0000 mg | INTRAMUSCULAR | Status: AC | PRN
  Administered 2017-03-07: 80 mg

## 2017-03-07 NOTE — Progress Notes (Signed)
Office Visit Note   Patient: [name]           Date of Birth: [birth year]           MRN: [id number] Visit Date: 03/07/2017              Requested by: Asencion Noble, MD [address] [place], [place] [address] PCP: Asencion Noble, MD   Assessment & Plan: Visit Diagnoses:  1. Unilateral primary osteoarthritis, right knee   2. Pain of left hip joint   , Greater trochanteric bursitis left hip versus referred pain from back  Plan:Orders are injection right knee, cortisone injection over greater trochanter left hip and monitor her response  Follow-Up Instructions: Return if symptoms worsen or fail to improve.   Orders:  No orders of the defined types were placed in this encounter.  No orders of the defined types were placed in this encounter.     Procedures: Large Joint Inj Date/Time: 03/07/2017 3:43 PM Performed by: Garald Balding Authorized by: Garald Balding   Consent Given by:  Patient Timeout: prior to procedure the correct patient, procedure, and site was verified   Indications:  Pain and joint swelling Location:  Knee Site:  R knee Prep: patient was prepped and draped in usual sterile fashion   Needle Size:  25 G Needle Length:  1.5 inches Approach:  Anteromedial Ultrasound Guidance: No   Fluoroscopic Guidance: No   Arthrogram: No   Medications:  3 mL bupivacaine 0.5 %; 5 mL lidocaine 1 %; 40 mg methylPREDNISolone acetate 40 MG/ML; 80 mg methylPREDNISolone acetate 40 MG/ML Aspiration Attempted: No   Patient tolerance:  Patient tolerated the procedure well with no immediate complications  Large Joint Inj Date/Time: 03/07/2017 3:44 PM Performed by: Garald Balding Authorized by: Garald Balding   Consent Given by:  Patient Timeout: prior to procedure the correct patient, procedure, and site was verified   Indications:  Pain Location:  Hip Site:  L greater trochanter Prep: patient was prepped and draped in usual sterile fashion   Needle  Size:  22 G Needle Length:  3.5 inches Approach:  Lateral Ultrasound Guidance: No   Fluoroscopic Guidance: No   Arthrogram: No   Medications:  5 mL lidocaine 1 %; 40 mg methylPREDNISolone acetate 40 MG/ML Aspiration Attempted: No   Patient tolerance:  Patient tolerated the procedure well with no immediate complications     Clinical Data: No additional findings.   Subjective: Chief Complaint  Patient presents with  . Right Knee - Pain  . Left Hip - Pain, Numbness, Weakness  [name] is known to have osteoarthritis of her lumbar spine and right knee. She's also had recurrent pain about the lateral aspect of her left hip. She's had a prior cortisone injection of the greater trochanter that did provide some relief. She's had some recurrent pain recently seems to be worse when she sits for a length of time. The pain is mostly over the lateral aspect of her left hip. She's had some mild back pain. No significant referred pain in her left lower extremity. She also recently has had some recurrent pain in her right knee. Prior films of her right knee revealed significant osteoarthritis with near bone-on-bone in the medial compartment. If she could have another "cortisone injection".  HPI  Review of Systems  Constitutional: Positive for fatigue. Negative for chills and fever.  Eyes: Positive for itching.  Respiratory: Negative for chest tightness and shortness of breath.  Cardiovascular: Negative for chest pain, palpitations and leg swelling.  Gastrointestinal: Negative for blood in stool, constipation and diarrhea.  Musculoskeletal: Negative for back pain, joint swelling, neck pain and neck stiffness.  Neurological: Positive for light-headedness. Negative for dizziness, weakness, numbness and headaches.  Hematological: Does not bruise/bleed easily.  Psychiatric/Behavioral: Negative for sleep disturbance. The patient is not nervous/anxious.      Objective: Vital Signs: BP 116/87    Pulse 94   Ht 5\' 1"  (1.549 m)   Wt 185 lb (83.9 kg)   BMI 34.96 kg/m   Physical Exam  Ortho Exam awake alert and oriented 3 comfortable sitting. Does have some local tenderness along the medial aspect of her right knee. Large knees. Difficult to know if there is an effusion. Full extension and about 100 of flexion without instability. No calf pain. No popliteal pain. +1 pulses.  Local tenderness over the greater trochanter of her left hip skin intact. Neurovascular exam intact. No pain range of motion of either hip. Straight leg raise negative. No percussible tenderness lumbar spine.  Specialty Comments:  No specialty comments available.  Imaging: No results found.   PMFS History: Patient Active Problem List   Diagnosis Date Noted  . Lower extremity edema 05/03/2016  . Early satiety 05/07/2014  . Dysphagia, pharyngoesophageal phase 05/07/2014  . Cough 03/09/2014  . Breast cancer of upper-inner quadrant of right female breast ([place]) 06/25/2013  . Bilateral breast cancer ([place]) 05/09/2013  . Wrist fracture, closed 12/10/2012  . Triquetral fracture 10/29/2012  . Sprain of right wrist 10/29/2012  . Chronic anticoagulation 04/12/2011  . Atrial fibrillation ([place]) 03/22/2011  . Arteriosclerotic cardiovascular disease (ASCVD)   . Hyperlipidemia   . Polio   . Hypertension   . GERD (gastroesophageal reflux disease)   . HERPES ZOSTER 11/12/2008  . ANXIETY 07/24/2007  . PERIPHERAL NEUROPATHY 07/24/2007   Past Medical History:  Diagnosis Date  . Anxiety   . Arteriosclerotic cardiovascular disease (ASCVD)     cath Feb '07- no obstructive dz - 20% proximal LAD only; EF 65%; possible coronary artery spasm  . Atrial fibrillation (HCC)    Paroxysmal on event recorder in 2009; regular supraventricular tachycardia at a rate of 150 also identified on that study representing either atrial flutter or PSVT; onset of persistent AF in 03/2011  . Carcinoma of breast ([place]) 2001   Left mastectomy  with positive nodes  . Chest pain    Long-standing and atypical  . Chronic anticoagulation 04/12/2011  . GERD (gastroesophageal reflux disease)    hiatal hernia  . Herpes zoster   . Hyperlipidemia    Lipid profile in 11/2008:282, 176, 64, 201  . Hypertension    diastolic dysfunction; normal CMet and TSH in 11/2009; normal CBC in 04/2010  . Malignant neoplasm of breast (female), unspecified site 05/27/2013   right breast lumpectomy with positive node  . Peripheral neuropathy   . Polio 1946   at age 83  . Rectal bleed   . Renal insufficiency   . Shortness of breath dyspnea     Family History  Problem Relation Age of Onset  . Cancer Sister        lung  . Cancer Brother        lipoma  . Cancer Brother        kidney    Past Surgical History:  Procedure Laterality Date  . BREAST LUMPECTOMY WITH NEEDLE LOCALIZATION AND AXILLARY SENTINEL LYMPH NODE BX Right 05/27/2013   Procedure: BREAST LUMPECTOMY WITH  NEEDLE LOCALIZATION AND AXILLARY SENTINEL LYMPH NODE BX;  Surgeon: Pedro Earls, MD;  Location: [facility];  Service: General;  Laterality: Right;  . BREAST SURGERY    . CHOLECYSTECTOMY  1999  . COLONOSCOPY  02/22/2011   Procedure: COLONOSCOPY;  Surgeon: Rogene Houston, MD;  [facility];  Service: Endoscopy;  Laterality: N/A;; performed for scant hematochezia  . DILATION AND CURETTAGE OF UTERUS    . ESOPHAGOGASTRODUODENOSCOPY  02/22/2011   Procedure: ESOPHAGOGASTRODUODENOSCOPY (EGD);  Surgeon: Rogene Houston, MD;  [facility];  Service: Endoscopy;  Laterality: N/A;  . ESOPHAGOGASTRODUODENOSCOPY N/A 06/02/2014   Procedure: ESOPHAGOGASTRODUODENOSCOPY (EGD);  Surgeon: Rogene Houston, MD;  [facility];  Service: Endoscopy;  Laterality: N/A;  730  . EYE SURGERY     both cataracts  . MALONEY DILATION N/A 06/02/2014   Procedure: Venia Minks DILATION;  Surgeon: Rogene Houston, MD;  [facility];  Service: Endoscopy;  Laterality:  N/A;  . MASTECTOMY  2001   Left;modified radical with lymph node dissection  . PORT-A-CATH REMOVAL     pac in and out 2002   Social History   Occupational History  . homemaker    Social History Main Topics  . Smoking status: Never Smoker  . Smokeless tobacco: Never Used  . Alcohol use No  . Drug use: No  . Sexual activity: Not Currently    Birth control/ protection: Post-menopausal

## 2017-03-13 DIAGNOSIS — I482 Chronic atrial fibrillation: Secondary | ICD-10-CM | POA: Diagnosis not present

## 2017-03-13 DIAGNOSIS — C50919 Malignant neoplasm of unspecified site of unspecified female breast: Secondary | ICD-10-CM | POA: Diagnosis not present

## 2017-03-13 DIAGNOSIS — I5022 Chronic systolic (congestive) heart failure: Secondary | ICD-10-CM | POA: Diagnosis not present

## 2017-03-13 DIAGNOSIS — Z79899 Other long term (current) drug therapy: Secondary | ICD-10-CM | POA: Diagnosis not present

## 2017-03-13 DIAGNOSIS — I10 Essential (primary) hypertension: Secondary | ICD-10-CM | POA: Diagnosis not present

## 2017-03-19 ENCOUNTER — Telehealth: Payer: Self-pay | Admitting: Internal Medicine

## 2017-03-19 NOTE — Telephone Encounter (Signed)
Called pt., no answer left message for pt. To return call.

## 2017-03-19 NOTE — Telephone Encounter (Signed)
Patient left message on voicemail that she needed to speak with Dr.Taylor's nurse. Did not state reason. / tg

## 2017-03-20 ENCOUNTER — Other Ambulatory Visit (INDEPENDENT_AMBULATORY_CARE_PROVIDER_SITE_OTHER): Payer: Self-pay | Admitting: Internal Medicine

## 2017-03-20 ENCOUNTER — Other Ambulatory Visit: Payer: Self-pay | Admitting: Adult Health

## 2017-03-20 DIAGNOSIS — I482 Chronic atrial fibrillation: Secondary | ICD-10-CM | POA: Diagnosis not present

## 2017-03-20 DIAGNOSIS — I5022 Chronic systolic (congestive) heart failure: Secondary | ICD-10-CM | POA: Diagnosis not present

## 2017-03-20 DIAGNOSIS — I10 Essential (primary) hypertension: Secondary | ICD-10-CM | POA: Diagnosis not present

## 2017-03-20 NOTE — Telephone Encounter (Signed)
Pt. Notified that she need to show proof of spending $125.47 in Rx. Three sample boxes of Eliquis placed at front desk for pt pick up.

## 2017-03-27 DIAGNOSIS — H401112 Primary open-angle glaucoma, right eye, moderate stage: Secondary | ICD-10-CM | POA: Diagnosis not present

## 2017-04-03 ENCOUNTER — Telehealth: Payer: Self-pay | Admitting: *Deleted

## 2017-04-03 NOTE — Telephone Encounter (Signed)
Patient declined Euflexxa at this time.

## 2017-04-05 ENCOUNTER — Telehealth: Payer: Self-pay | Admitting: *Deleted

## 2017-04-05 DIAGNOSIS — H353122 Nonexudative age-related macular degeneration, left eye, intermediate dry stage: Secondary | ICD-10-CM | POA: Diagnosis not present

## 2017-04-05 DIAGNOSIS — H353114 Nonexudative age-related macular degeneration, right eye, advanced atrophic with subfoveal involvement: Secondary | ICD-10-CM | POA: Diagnosis not present

## 2017-04-05 DIAGNOSIS — H35721 Serous detachment of retinal pigment epithelium, right eye: Secondary | ICD-10-CM | POA: Diagnosis not present

## 2017-04-05 DIAGNOSIS — H353211 Exudative age-related macular degeneration, right eye, with active choroidal neovascularization: Secondary | ICD-10-CM | POA: Diagnosis not present

## 2017-04-05 NOTE — Telephone Encounter (Signed)
Pt notified she have been approved for Eliquis assistance program

## 2017-04-11 ENCOUNTER — Telehealth: Payer: Self-pay

## 2017-04-11 NOTE — Telephone Encounter (Signed)
LM that Eliquis samples were at front desk

## 2017-04-18 ENCOUNTER — Ambulatory Visit (INDEPENDENT_AMBULATORY_CARE_PROVIDER_SITE_OTHER): Payer: PPO | Admitting: Urology

## 2017-04-18 DIAGNOSIS — R3912 Poor urinary stream: Secondary | ICD-10-CM | POA: Diagnosis not present

## 2017-04-18 DIAGNOSIS — N3281 Overactive bladder: Secondary | ICD-10-CM | POA: Diagnosis not present

## 2017-04-25 ENCOUNTER — Telehealth: Payer: Self-pay

## 2017-04-25 ENCOUNTER — Other Ambulatory Visit: Payer: Self-pay

## 2017-04-25 DIAGNOSIS — C50912 Malignant neoplasm of unspecified site of left female breast: Principal | ICD-10-CM

## 2017-04-25 DIAGNOSIS — Z170 Estrogen receptor positive status [ER+]: Principal | ICD-10-CM

## 2017-04-25 DIAGNOSIS — C50911 Malignant neoplasm of unspecified site of right female breast: Secondary | ICD-10-CM

## 2017-04-25 DIAGNOSIS — C50211 Malignant neoplasm of upper-inner quadrant of right female breast: Secondary | ICD-10-CM

## 2017-04-25 NOTE — Telephone Encounter (Signed)
Mammogram scheduled for pt at [facility] for 11/06 at 2:10.  Pt is aware of appt date/time

## 2017-04-26 ENCOUNTER — Other Ambulatory Visit: Payer: Self-pay | Admitting: Oncology

## 2017-04-26 DIAGNOSIS — Z170 Estrogen receptor positive status [ER+]: Principal | ICD-10-CM

## 2017-04-26 DIAGNOSIS — C50919 Malignant neoplasm of unspecified site of unspecified female breast: Secondary | ICD-10-CM

## 2017-05-10 DIAGNOSIS — H353114 Nonexudative age-related macular degeneration, right eye, advanced atrophic with subfoveal involvement: Secondary | ICD-10-CM | POA: Diagnosis not present

## 2017-05-10 DIAGNOSIS — H353211 Exudative age-related macular degeneration, right eye, with active choroidal neovascularization: Secondary | ICD-10-CM | POA: Diagnosis not present

## 2017-05-10 DIAGNOSIS — H353122 Nonexudative age-related macular degeneration, left eye, intermediate dry stage: Secondary | ICD-10-CM | POA: Diagnosis not present

## 2017-05-10 DIAGNOSIS — H35721 Serous detachment of retinal pigment epithelium, right eye: Secondary | ICD-10-CM | POA: Diagnosis not present

## 2017-05-15 ENCOUNTER — Ambulatory Visit (HOSPITAL_COMMUNITY)
Admission: RE | Admit: 2017-05-15 | Discharge: 2017-05-15 | Disposition: A | Discharge status: Home / Self Care | Payer: PPO | Source: Ambulatory Visit | Attending: Oncology | Admitting: Oncology

## 2017-05-15 DIAGNOSIS — Z853 Personal history of malignant neoplasm of breast: Secondary | ICD-10-CM | POA: Diagnosis not present

## 2017-05-15 DIAGNOSIS — R928 Other abnormal and inconclusive findings on diagnostic imaging of breast: Secondary | ICD-10-CM | POA: Diagnosis not present

## 2017-05-15 DIAGNOSIS — C50911 Malignant neoplasm of unspecified site of right female breast: Secondary | ICD-10-CM | POA: Diagnosis present

## 2017-05-15 DIAGNOSIS — C50912 Malignant neoplasm of unspecified site of left female breast: Secondary | ICD-10-CM

## 2017-05-15 DIAGNOSIS — Z170 Estrogen receptor positive status [ER+]: Secondary | ICD-10-CM | POA: Diagnosis present

## 2017-05-15 DIAGNOSIS — C50211 Malignant neoplasm of upper-inner quadrant of right female breast: Secondary | ICD-10-CM

## 2017-05-25 ENCOUNTER — Other Ambulatory Visit: Payer: Self-pay | Admitting: Oncology

## 2017-05-25 DIAGNOSIS — C50919 Malignant neoplasm of unspecified site of unspecified female breast: Secondary | ICD-10-CM

## 2017-05-25 DIAGNOSIS — Z170 Estrogen receptor positive status [ER+]: Principal | ICD-10-CM

## 2017-05-29 ENCOUNTER — Telehealth: Payer: Self-pay | Admitting: Internal Medicine

## 2017-05-29 NOTE — Telephone Encounter (Signed)
Call returned to Pt.   Call went to VM.  Left this nurse name and # to call back.

## 2017-05-29 NOTE — Telephone Encounter (Signed)
Call back received from Pt.  Verified Pt current blood pressure medications.  Current list accurate and Pt taking as directed.  Will discuss with Dr. Lovena Le.

## 2017-05-29 NOTE — Telephone Encounter (Signed)
Called stating she's having high BP --would like to speak w/ nurse. Pt can be reached @ [phone]

## 2017-05-29 NOTE — Telephone Encounter (Signed)
Returned pt call. She stated that she has had high bp readings for the past three weeks. They are ranging from 153/113, 146/106, 140/105. It has not been lower than 142/98 in several days. She has been taking her lasix @ 40 mg daily for the past few days and is now feeling weak. She stated that she has not eaten any different than normal and she has been taking her medications as directed. She asks if she can take a different medication to help lower her blood pressure. Please advise.

## 2017-05-30 ENCOUNTER — Ambulatory Visit: Payer: PPO | Admitting: Urology

## 2017-05-30 DIAGNOSIS — N3281 Overactive bladder: Secondary | ICD-10-CM | POA: Diagnosis not present

## 2017-05-30 DIAGNOSIS — R3912 Poor urinary stream: Secondary | ICD-10-CM | POA: Diagnosis not present

## 2017-05-30 MED ORDER — CARVEDILOL 12.5 MG PO TABS: 18.3750 mg | ORAL_TABLET | Freq: Two times a day (BID) | ORAL | 3 refills | Status: DC

## 2017-05-30 NOTE — Telephone Encounter (Signed)
Call placed to Pt.  Per Dr. Lovena Le have Pt increase carvedilol 12.5 mg bid to 18.375 bid.  Pt notified.  Script sent.  Pt indicates understanding.

## 2017-05-31 NOTE — Progress Notes (Signed)
[facility]  [phone] [phone]     ID: [name] OB: [birth year]  MR#: [id number]  AUQ#:[id number]  PCP: Asencion Noble, MD GYN:   SU: Johnathan Hausen OTHER MD: Tyler Pita, Cristopher Peru, Hildred Laser  CHIEF COMPLAINT: Estrogen receptor positive breast cancer  CURRENT TREATMENT: [Exemestane]  BREAST CANCER HISTORY: From doctor [facility]'s 06/26/2013 note:  "Pleasant 83 year old woman who I graduated from our practice over 3 years ago in 2011. She was a 10 year survivor of an initial stage II, 6 node positive, ER PR positive, HER-2 positive, cancer of the left breast diagnosed in October 2001 treated by mastectomy followed by 6 cycles of Cytoxan Adriamycin Taxotere chemotherapy then 5 years of Arimidex hormonal therapy. Arimidex completed in June 2011.    A recent routine mammogram done  on 03/25/2013 showed clustered calcifications which were suspicious for malignancy. She underwent a diagnostic mammogram on October 3 which confirmed these findings then she underwent a needle biopsy on October 14. MRI done on October 29 showed a 2 x 1.8 x 1.3 cm lesion at the 12:00 position. She underwent a lumpectomy and sentinel lymph node dissection on 05/27/2013 by Dr. Johnathan Hausen. Final measurements of the tumor were 1.5 cm. Primarily invasive ductal. Positive vascular and lymphatic invasion. Grade 2 of 3. Estrogen and progesterone receptors both 100%. HER-2 negative. Ki-67 55%. One of 2 sentinel lymph nodes grossly positive for invasive cancer.   She has had a number of interim medical problems since she last saw me. She has developed atrial fibrillation. This was poorly controlled medically. She underwent DC cardioversion 2 years ago but unfortunately has reverted to atrial fibrillation again recently and another cardioversion procedure is planned. She was on verapamil long-acting and was recently started on amiodarone. Of note she was also started on one  of the new oral anticoagulants, Pradaxa, in full doses of 150 mg twice daily."  Her subsequent history is as detailed below  INTERVAL HISTORY: [name] returns today for follow-up and treatment of her estrogen receptor positive breast cancer.  She continues on exemestane, with fair tolerance overall. She denies hot flashes and vaginal dryness. She reports generalized achy joints that is exacerbated with standing from a seated position. She notes that her achy joints tend to "loosen up" with walking.   Since her last visit to the office, she underwent Bone Density scan on 10/30/2016 with results revealing: T-score of -1.3. She has also had an unilateral right diagnostic mammography with tomography completed at [facility] on 05/15/2017 with results showing: No mammographic evidence of malignancy in the right beast.      REVIEW OF SYSTEMS: [name] reports that she remains active during the week. 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 She notes that she is off on Thursdays and she plays rook in her spare time. She reports residual neuropathy from chemotherapy. She has bilateral knee problems. She has a large hiatal hernia. She has a PMHx of A-fib and HTN. She is taking eliquis without bleeding issues and she notes an occasional bruise. She denies unusual headaches, visual changes, nausea, vomiting, or dizziness. There has been no unusual cough, phlegm production, or pleurisy. This been no change in bowel or bladder habits. She denies unexplained fatigue or unexplained weight loss, bleeding, rash, or fever. A detailed review of systems was otherwise stable.     PAST MEDICAL HISTORY: Past  Medical History:  Diagnosis Date  . Anxiety   . Arteriosclerotic cardiovascular disease (ASCVD)     cath Feb '07- no obstructive dz - 20% proximal LAD only; EF 65%; possible coronary artery spasm    . Atrial fibrillation (HCC)    Paroxysmal on event recorder in 2009; regular supraventricular tachycardia at a rate of 150 also identified on that study representing either atrial flutter or PSVT; onset of persistent AF in 03/2011  . Carcinoma of breast ([place]) 2001   Left mastectomy with positive nodes  . Chest pain    Long-standing and atypical  . Chronic anticoagulation 04/12/2011  . GERD (gastroesophageal reflux disease)    hiatal hernia  . Herpes zoster   . Hyperlipidemia    Lipid profile in 11/2008:282, 176, 64, 201  . Hypertension    diastolic dysfunction; normal CMet and TSH in 11/2009; normal CBC in 04/2010  . Malignant neoplasm of breast (female), unspecified site 05/27/2013   right breast lumpectomy with positive node  . Peripheral neuropathy   . Polio 1946   at age 8  . Rectal bleed   . Renal insufficiency   . Shortness of breath dyspnea     PAST SURGICAL HISTORY: Past Surgical History:  Procedure Laterality Date  . BREAST LUMPECTOMY WITH NEEDLE LOCALIZATION AND AXILLARY SENTINEL LYMPH NODE BX Right 05/27/2013   Procedure: BREAST LUMPECTOMY WITH NEEDLE LOCALIZATION AND AXILLARY SENTINEL LYMPH NODE BX;  Surgeon: Pedro Earls, MD;  Location: [facility];  Service: General;  Laterality: Right;  . BREAST SURGERY    . CHOLECYSTECTOMY  1999  . COLONOSCOPY  02/22/2011   Procedure: COLONOSCOPY;  Surgeon: Rogene Houston, MD;  [facility];  Service: Endoscopy;  Laterality: N/A;; performed for scant hematochezia  . DILATION AND CURETTAGE OF UTERUS    . ESOPHAGOGASTRODUODENOSCOPY  02/22/2011   Procedure: ESOPHAGOGASTRODUODENOSCOPY (EGD);  Surgeon: Rogene Houston, MD;  [facility];  Service: Endoscopy;  Laterality: N/A;  . ESOPHAGOGASTRODUODENOSCOPY N/A 06/02/2014   Procedure: ESOPHAGOGASTRODUODENOSCOPY (EGD);  Surgeon: Rogene Houston, MD;  [facility];  Service: Endoscopy;  Laterality: N/A;  730  . EYE SURGERY     both  cataracts  . MALONEY DILATION N/A 06/02/2014   Procedure: Venia Minks DILATION;  Surgeon: Rogene Houston, MD;  [facility];  Service: Endoscopy;  Laterality: N/A;  . MASTECTOMY  2001   Left;modified radical with lymph node dissection  . PORT-A-CATH REMOVAL     pac in and out 2002    FAMILY HISTORY Family History  Problem Relation Age of Onset  . Cancer Sister        lung  . Cancer Brother        lipoma  . Cancer Brother        kidney   the patient's parents died from noncancer related causes. The patient had 2 sisters and 2 brothers. One sister developed lung cancer and a brother non-Hodgkin's lymphoma. A brother has also had a history of melanoma. There is no history of breast or ovarian cancer in the family  GYNECOLOGIC HISTORY:  Menarche age 25, first live birth age 64. The patient is GX P2. She went through the change of life approximately age 68, and took hormone replacement for "many years".  SOCIAL HISTORY:  [name] is a widow and lives by herself.The patient's 2 children are [name], who lives in [place] and is retired from [employer], and [name], who also lives in  [place], and is in the building trade. The patient has 5 grandchildren and 4 great-grandchildren. She is a Psychologist, forensic.    ADVANCED DIRECTIVES: not scanned   HEALTH MAINTENANCE: Social History   Tobacco Use  . Smoking status: Never Smoker  . Smokeless tobacco: Never Used  Substance Use Topics  . Alcohol use: No    Alcohol/week: 0.0 oz  . Drug use: No    Allergies  Allergen Reactions  . Atorvastatin Other (See Comments)    Myalgias  . Hydrocodone Other (See Comments)    GI distress.    Current Outpatient Medications  Medication Sig Dispense Refill  . ALPRAZolam (XANAX) 0.25 MG tablet 0.25 mg. TAKE 1/2 TABLET AS NEEDED    . apixaban (ELIQUIS) 5 MG TABS tablet Take 1 tablet (5 mg total) by mouth 2 (two) times daily. 60 tablet 11  . carvedilol (COREG) 12.5 MG tablet Take  1.5 tablets (18.75 mg total) by mouth 2 (two) times daily. 270 tablet 3  . COMBIGAN 0.2-0.5 % ophthalmic solution     . exemestane (AROMASIN) 25 MG tablet TAKE 1 TABLET EACH MORNING AFTER BREAKFAST TO PREVENT BREAST CANCER. 30 tablet 0  . furosemide (LASIX) 40 MG tablet Take 1 tablet (40 mg total) by mouth daily. (Patient taking differently: Take 40 mg by mouth daily. Pt cut her dose to 20 mg daily) 90 tablet 3  . Latanoprostene Bunod (VYZULTA) 0.024 % SOLN Apply to eye.    . losartan (COZAAR) 25 MG tablet Take 1 tablet (25 mg total) by mouth 2 (two) times daily. 90 tablet 3  . Netarsudil Dimesylate (RHOPRESSA) 0.02 % SOLN Apply to eye.    . pantoprazole (PROTONIX) 40 MG tablet TAKE ONE TABLET BY MOUTH DAILY BEFORE BREAKFAST FOR ACID REFLUX. 30 tablet 11   No current facility-administered medications for this visit.     OBJECTIVE: Older white woman in no acute distress  Vitals:   06/05/17 1035  BP: (!) 129/111  Pulse: 81  Resp: 18  Temp: (!) 97.5 F (36.4 C)  SpO2: 96%     Body mass index is 34.33 kg/m.    ECOG FS:1 - Symptomatic but completely ambulatory Filed Weights   06/05/17 1035  Weight: 181 lb 11.2 oz (82.4 kg)   Sclerae unicteric, EOMs intact Oropharynx clear and moist No cervical or supraclavicular adenopathy Lungs no rales or rhonchi Heart regular rate and rhythm Abd soft, nontender, positive bowel sounds MSK no focal spinal tenderness, no upper extremity lymphedema Neuro: nonfocal, well oriented, appropriate affect Breasts: The right breast is unremarkable.  The left breast is status post mastectomy.  There is no evidence of local recurrence.  Both axillae are benign.  LAB RESULTS:  CMP     Component Value Date/Time   NA 143 06/06/2016 1532   NA 142 06/05/2016 1000   K 4.4 06/06/2016 1532   K 3.9 06/05/2016 1000   CL 99 06/06/2016 1532   CO2 27 06/06/2016 1532   CO2 30 (H) 06/05/2016 1000   GLUCOSE 75 06/06/2016 1532   GLUCOSE 105 06/05/2016 1000   BUN  15 06/06/2016 1532   BUN 15.3 06/05/2016 1000   CREATININE 1.03 (H) 06/06/2016 1532   CREATININE 1.0 06/05/2016 1000   CALCIUM 9.4 06/06/2016 1532   CALCIUM 9.8 06/05/2016 1000   PROT 7.0 06/05/2016 1000   ALBUMIN 3.4 (L) 06/05/2016 1000   AST 15 06/05/2016 1000   ALT 10 06/05/2016 1000   ALKPHOS 125 06/05/2016 1000   BILITOT 0.98 06/05/2016  1000   GFRNONAA 51 (L) 06/06/2016 1532   GFRAA 58 (L) 06/06/2016 1532    I No results found for: SPEP  Lab Results  Component Value Date   WBC 5.8 06/05/2017   NEUTROABS 3.7 06/05/2017   HGB 14.7 06/05/2017   HCT 44.8 06/05/2017   MCV 89.0 06/05/2017   PLT 217 06/05/2017      Chemistry      Component Value Date/Time   NA 143 06/06/2016 1532   NA 142 06/05/2016 1000   K 4.4 06/06/2016 1532   K 3.9 06/05/2016 1000   CL 99 06/06/2016 1532   CO2 27 06/06/2016 1532   CO2 30 (H) 06/05/2016 1000   BUN 15 06/06/2016 1532   BUN 15.3 06/05/2016 1000   CREATININE 1.03 (H) 06/06/2016 1532   CREATININE 1.0 06/05/2016 1000      Component Value Date/Time   CALCIUM 9.4 06/06/2016 1532   CALCIUM 9.8 06/05/2016 1000   ALKPHOS 125 06/05/2016 1000   AST 15 06/05/2016 1000   ALT 10 06/05/2016 1000   BILITOT 0.98 06/05/2016 1000       No results found for: LABCA2  No components found for: [id number]  No results for input(s): INR in the last 168 hours.  Urinalysis    Component Value Date/Time   COLORURINE LT. YELLOW 11/12/2008 1508   APPEARANCEUR CLEAR 11/12/2008 1508   LABSPEC >=1.030 11/12/2008 1508   PHURINE 5.0 11/12/2008 1508   GLUCOSEU NEGATIVE 11/12/2008 1508   BILIRUBINUR NEGATIVE 11/12/2008 1508   KETONESUR NEGATIVE 11/12/2008 1508   UROBILINOGEN 0.2 11/12/2008 1508   NITRITE NEGATIVE 11/12/2008 1508   LEUKOCYTESUR SMALL 11/12/2008 1508    STUDIES: Mm Diag Breast Tomo Uni Right  Result Date: 05/15/2017 CLINICAL DATA:  83 year old female with history of right breast cancer post lumpectomy 05/27/2013. Remote history of  left breast cancer with lumpectomy in 2001. EXAM: 2D DIGITAL DIAGNOSTIC UNILATERAL RIGHT MAMMOGRAM WITH CAD AND ADJUNCT TOMO COMPARISON:  Previous exam(s). ACR Breast Density Category b: There are scattered areas of fibroglandular density. FINDINGS: No suspicious masses or calcifications are seen in the right breast. Postsurgical changes are present in the central upper right breast related prior lumpectomy. Spot compression magnification view of the lumpectomy site in the right breast was performed. There is no mammographic evidence of locally recurrent malignancy. Mammographic images were processed with CAD. IMPRESSION: No mammographic evidence of malignancy in the right breast. RECOMMENDATION: Diagnostic mammography of the right breast in 1 year. I have discussed the findings and recommendations with the patient. Results were also provided in writing at the conclusion of the visit. If applicable, a reminder letter will be sent to the patient regarding the next appointment. BI-RADS CATEGORY  2: Benign. Electronically Signed   By: Everlean Alstrom M.D.   On: 05/15/2017 14:41   Since her last visit to the office, she has undergone Bone Density scan on 10/30/2016 with results revealing: T-score of -1.3.    ASSESSMENT: 83 y.o. [place] woman  (1) status post left modified radical mastectomy 05/25/2000 for a pT1(mic) pN2, stage IIIA upper inner quadrant invasive ductal carcinoma, grade 3 estrogen receptor 93% positive, progesterone receptor 17% positive, with an MIB-1 of 38% and no HER-2 amplification by FISH  (2) status post adjuvant chemotherapy with docetaxel, doxorubicin and cyclophosphamide x6, followed by adjuvant radiation to the left chest wall, followed by anastrozole which was continued until may of 2011  (3) status post right lumpectomy with sentinel lymph node biopsy 05/27/2013 for a pT1c  pN1, stage IIA invasive ductal carcinoma, grade 2, estrogen and progesterone receptor strongly positive,  HER-2 not amplified, with an MIB-1 of 55%  (4) completed adjuvant radiation to the right breast, axilla, and right supraclavicular region 09/08/2013  (5) started exemestane 12/09/2013--held November 2018 with concern regarding arthralgias/myalgias  (6) osteopenia, with a T score of -1.2 on bone density study October 2015  PLAN: [name] is now 4 years out from definitive surgery for her breast cancer with no evidence of disease recurrence.  This is very favorable.  She has taken exemestane for 3-1/2 years.  She feels that she is having some arthralgias and myalgias related to this.  Of course she could simply be having osteoarthritis related to her age.  We discussed that at length.  The only way to find out is to stop the exemestane and reassess.  We are stopping the exemestane now.  She is going to give [place] a call the first week in February.  If she is feeling at least 10% better we will simply stay off that medication.  (We have no data comparing 3-4 years of exemestane versus 5 years).  If she is really feeling no better she will go back on the  Either way she will see me again one last time a year from now and at that point she will be ready to "graduate" from follow-up here  She knows to call for any other issues that may develop before then.  Magrinat, Virgie Dad, MD  06/05/17 10:51 AM Medical Oncology and Hematology [facility] [address] [place], [place] [address] Tel. [phone]    Fax. [phone]    This document serves as a record of services personally performed by Lurline Del, MD. It was created on his behalf by Steva Colder, a trained medical scribe. The creation of this record is based on the scribe's personal observations and the provider's statements to them.   I have reviewed the above documentation for accuracy and completeness, and I agree with the above.

## 2017-06-05 ENCOUNTER — Other Ambulatory Visit (HOSPITAL_BASED_OUTPATIENT_CLINIC_OR_DEPARTMENT_OTHER): Payer: PPO

## 2017-06-05 ENCOUNTER — Ambulatory Visit: Payer: PPO | Admitting: Oncology

## 2017-06-05 ENCOUNTER — Telehealth: Payer: Self-pay | Admitting: Oncology

## 2017-06-05 VITALS — BP 129/111 | HR 81 | Temp 97.5°F | Resp 18 | Ht 61.0 in | Wt 181.7 lb

## 2017-06-05 DIAGNOSIS — C50211 Malignant neoplasm of upper-inner quadrant of right female breast: Secondary | ICD-10-CM

## 2017-06-05 DIAGNOSIS — Z170 Estrogen receptor positive status [ER+]: Secondary | ICD-10-CM | POA: Diagnosis not present

## 2017-06-05 DIAGNOSIS — C50212 Malignant neoplasm of upper-inner quadrant of left female breast: Secondary | ICD-10-CM

## 2017-06-05 DIAGNOSIS — M8580 Other specified disorders of bone density and structure, unspecified site: Secondary | ICD-10-CM

## 2017-06-05 DIAGNOSIS — C50912 Malignant neoplasm of unspecified site of left female breast: Secondary | ICD-10-CM

## 2017-06-05 DIAGNOSIS — Z7901 Long term (current) use of anticoagulants: Secondary | ICD-10-CM

## 2017-06-05 DIAGNOSIS — Z923 Personal history of irradiation: Secondary | ICD-10-CM

## 2017-06-05 DIAGNOSIS — G620 Drug-induced polyneuropathy: Secondary | ICD-10-CM | POA: Diagnosis not present

## 2017-06-05 DIAGNOSIS — C50911 Malignant neoplasm of unspecified site of right female breast: Secondary | ICD-10-CM

## 2017-06-05 LAB — COMPREHENSIVE METABOLIC PANEL
ALBUMIN: 3.7 g/dL (ref 3.5–5.0)
ALT: 11 U/L (ref 0–55)
ANION GAP: 8 meq/L (ref 3–11)
AST: 17 U/L (ref 5–34)
Alkaline Phosphatase: 111 U/L (ref 40–150)
BILIRUBIN TOTAL: 1.17 mg/dL (ref 0.20–1.20)
BUN: 16.7 mg/dL (ref 7.0–26.0)
CALCIUM: 10 mg/dL (ref 8.4–10.4)
CO2: 29 meq/L (ref 22–29)
CREATININE: 0.9 mg/dL (ref 0.6–1.1)
Chloride: 104 mEq/L (ref 98–109)
EGFR: >60 mL/min/{1.73_m2} (ref >60)
Glucose: 102 mg/dl (ref 70–140)
Potassium: 4.6 mEq/L (ref 3.5–5.1)
Sodium: 140 mEq/L (ref 136–145)
TOTAL PROTEIN: 7.1 g/dL (ref 6.4–8.3)

## 2017-06-05 LAB — CBC WITH DIFFERENTIAL/PLATELET
BASO%: 1.1 % (ref 0.0–2.0)
Basophils Absolute: 0.1 10*3/uL (ref 0.0–0.1)
EOS%: 1.4 % (ref 0.0–7.0)
Eosinophils Absolute: 0.1 10*3/uL (ref 0.0–0.5)
HEMATOCRIT: 44.8 % (ref 34.8–46.6)
HEMOGLOBIN: 14.7 g/dL (ref 11.6–15.9)
LYMPH#: 1.4 10*3/uL (ref 0.9–3.3)
LYMPH%: 23.4 % (ref 14.0–49.7)
MCH: 29.2 pg (ref 25.1–34.0)
MCHC: 32.8 g/dL (ref 31.5–36.0)
MCV: 89 fL (ref 79.5–101.0)
MONO#: 0.6 10*3/uL (ref 0.1–0.9)
MONO%: 10.6 % (ref 0.0–14.0)
NEUT%: 63.5 % (ref 38.4–76.8)
NEUTROS ABS: 3.7 10*3/uL (ref 1.5–6.5)
PLATELETS: 217 10*3/uL (ref 145–400)
RBC: 5.03 10*6/uL (ref 3.70–5.45)
RDW: 14.3 % (ref 11.2–14.5)
WBC: 5.8 10*3/uL (ref 3.9–10.3)

## 2017-06-05 NOTE — Telephone Encounter (Signed)
Patient declined AVS . Patient received calendar of upcoming November 2019 appointments.

## 2017-06-13 ENCOUNTER — Encounter (INDEPENDENT_AMBULATORY_CARE_PROVIDER_SITE_OTHER): Payer: Self-pay | Admitting: Internal Medicine

## 2017-06-21 DIAGNOSIS — H401112 Primary open-angle glaucoma, right eye, moderate stage: Secondary | ICD-10-CM | POA: Diagnosis not present

## 2017-06-22 DIAGNOSIS — Z79899 Other long term (current) drug therapy: Secondary | ICD-10-CM | POA: Diagnosis not present

## 2017-06-22 DIAGNOSIS — E785 Hyperlipidemia, unspecified: Secondary | ICD-10-CM | POA: Diagnosis not present

## 2017-06-22 DIAGNOSIS — I10 Essential (primary) hypertension: Secondary | ICD-10-CM | POA: Diagnosis not present

## 2017-06-26 DIAGNOSIS — H353122 Nonexudative age-related macular degeneration, left eye, intermediate dry stage: Secondary | ICD-10-CM | POA: Diagnosis not present

## 2017-06-26 DIAGNOSIS — H353114 Nonexudative age-related macular degeneration, right eye, advanced atrophic with subfoveal involvement: Secondary | ICD-10-CM | POA: Diagnosis not present

## 2017-06-26 DIAGNOSIS — H35721 Serous detachment of retinal pigment epithelium, right eye: Secondary | ICD-10-CM | POA: Diagnosis not present

## 2017-06-26 DIAGNOSIS — H353211 Exudative age-related macular degeneration, right eye, with active choroidal neovascularization: Secondary | ICD-10-CM | POA: Diagnosis not present

## 2017-06-28 ENCOUNTER — Ambulatory Visit (INDEPENDENT_AMBULATORY_CARE_PROVIDER_SITE_OTHER): Payer: PPO | Admitting: Internal Medicine

## 2017-06-28 ENCOUNTER — Encounter (INDEPENDENT_AMBULATORY_CARE_PROVIDER_SITE_OTHER): Payer: Self-pay | Admitting: Internal Medicine

## 2017-06-28 VITALS — BP 102/80 | HR 56 | Temp 98.0°F | Ht 61.5 in | Wt 180.1 lb

## 2017-06-28 DIAGNOSIS — I5022 Chronic systolic (congestive) heart failure: Secondary | ICD-10-CM | POA: Diagnosis not present

## 2017-06-28 DIAGNOSIS — K219 Gastro-esophageal reflux disease without esophagitis: Secondary | ICD-10-CM | POA: Diagnosis not present

## 2017-06-28 DIAGNOSIS — Z23 Encounter for immunization: Secondary | ICD-10-CM | POA: Diagnosis not present

## 2017-06-28 DIAGNOSIS — I10 Essential (primary) hypertension: Secondary | ICD-10-CM | POA: Diagnosis not present

## 2017-06-28 DIAGNOSIS — I482 Chronic atrial fibrillation: Secondary | ICD-10-CM | POA: Diagnosis not present

## 2017-06-28 NOTE — Patient Instructions (Signed)
OV in 1 year.  

## 2017-06-28 NOTE — Progress Notes (Signed)
Subjective:    Patient ID: [name], female    DOB: 15-Feb-1932, 83 y.o.   MRN: [id number]  HPI Here today for f/u. Last seen in December of 2017. Hx of chronic GERD and hiatal hernia.  She is doing good. She has lost 5 pounds since her last visit.  Acid reflux for the most part controlled with Protonix. Her BMs are normal. Has BM daily. No melena or BRRB.    Hx of GERD and hiatal hernia. EGD 06/02/2014 revealed normal appearing esophageal mucosa without stricture formation. She had large sliding hiatal hernia with organoaxial rotation normal exam of stomach and first and second part of the duodenum.  Take Eliquis for atrial fib.    Review of Systems Past Medical History:  Diagnosis Date  . Anxiety   . Arteriosclerotic cardiovascular disease (ASCVD)     cath Feb '07- no obstructive dz - 20% proximal LAD only; EF 65%; possible coronary artery spasm  . Atrial fibrillation (HCC)    Paroxysmal on event recorder in 2009; regular supraventricular tachycardia at a rate of 150 also identified on that study representing either atrial flutter or PSVT; onset of persistent AF in 03/2011  . Carcinoma of breast ([place]) 2001   Left mastectomy with positive nodes  . Chest pain    Long-standing and atypical  . Chronic anticoagulation 04/12/2011  . GERD (gastroesophageal reflux disease)    hiatal hernia  . Herpes zoster   . Hyperlipidemia    Lipid profile in 11/2008:282, 176, 64, 201  . Hypertension    diastolic dysfunction; normal CMet and TSH in 11/2009; normal CBC in 04/2010  . Malignant neoplasm of breast (female), unspecified site 05/27/2013   right breast lumpectomy with positive node  . Peripheral neuropathy   . Polio 1946   at age 7  . Rectal bleed   . Renal insufficiency   . Shortness of breath dyspnea     Past Surgical History:  Procedure Laterality Date  . BREAST LUMPECTOMY WITH NEEDLE LOCALIZATION AND AXILLARY SENTINEL LYMPH NODE BX Right 05/27/2013   Procedure: BREAST  LUMPECTOMY WITH NEEDLE LOCALIZATION AND AXILLARY SENTINEL LYMPH NODE BX;  Surgeon: Pedro Earls, MD;  Location: [facility];  Service: General;  Laterality: Right;  . BREAST SURGERY    . CHOLECYSTECTOMY  1999  . COLONOSCOPY  02/22/2011   Procedure: COLONOSCOPY;  Surgeon: Rogene Houston, MD;  [facility];  Service: Endoscopy;  Laterality: N/A;; performed for scant hematochezia  . DILATION AND CURETTAGE OF UTERUS    . ESOPHAGOGASTRODUODENOSCOPY  02/22/2011   Procedure: ESOPHAGOGASTRODUODENOSCOPY (EGD);  Surgeon: Rogene Houston, MD;  [facility];  Service: Endoscopy;  Laterality: N/A;  . ESOPHAGOGASTRODUODENOSCOPY N/A 06/02/2014   Procedure: ESOPHAGOGASTRODUODENOSCOPY (EGD);  Surgeon: Rogene Houston, MD;  [facility];  Service: Endoscopy;  Laterality: N/A;  730  . EYE SURGERY     both cataracts  . MALONEY DILATION N/A 06/02/2014   Procedure: Venia Minks DILATION;  Surgeon: Rogene Houston, MD;  [facility];  Service: Endoscopy;  Laterality: N/A;  . MASTECTOMY  2001   Left;modified radical with lymph node dissection  . PORT-A-CATH REMOVAL     pac in and out 2002    Allergies  Allergen Reactions  . Atorvastatin Other (See Comments)    Myalgias  . Hydrocodone Other (See Comments)    GI distress.    Current Outpatient Medications on File Prior to Visit  Medication Sig Dispense  Refill  . ALPRAZolam (XANAX) 0.25 MG tablet 0.25 mg. TAKE 1/2 TABLET AS NEEDED    . apixaban (ELIQUIS) 5 MG TABS tablet Take 1 tablet (5 mg total) by mouth 2 (two) times daily. 60 tablet 11  . carvedilol (COREG) 12.5 MG tablet Take 1.5 tablets (18.75 mg total) by mouth 2 (two) times daily. 270 tablet 3  . COMBIGAN 0.2-0.5 % ophthalmic solution     . exemestane (AROMASIN) 25 MG tablet TAKE 1 TABLET EACH MORNING AFTER BREAKFAST TO PREVENT BREAST CANCER. 30 tablet 0  . Latanoprostene Bunod (VYZULTA) 0.024 % SOLN Apply to eye.    . losartan (COZAAR) 25 MG  tablet Take 1 tablet (25 mg total) by mouth 2 (two) times daily. 90 tablet 3  . Netarsudil Dimesylate (RHOPRESSA) 0.02 % SOLN Apply to eye.    . pantoprazole (PROTONIX) 40 MG tablet TAKE ONE TABLET BY MOUTH DAILY BEFORE BREAKFAST FOR ACID REFLUX. 30 tablet 11  . furosemide (LASIX) 40 MG tablet Take 1 tablet (40 mg total) by mouth daily. (Patient taking differently: Take 40 mg by mouth daily. Pt cut her dose to 20 mg daily) 90 tablet 3   No current facility-administered medications on file prior to visit.         Objective:   Physical Exam Blood pressure 102/80, pulse (!) 56, temperature 98 F (36.7 C), height 5' 1.5" (1.562 m), weight 180 lb 1.6 oz (81.7 kg). Alert and oriented. Skin warm and dry. Oral mucosa is moist.   . Sclera anicteric, conjunctivae is pink. Thyroid not enlarged. No cervical lymphadenopathy. Lungs clear. Heart irregular  Abdomen is soft. Bowel sounds are positive. No hepatomegaly. No abdominal masses felt. No tenderness.  No edema to lower extremities.             Assessment & Plan:     GERD/Hiatal hernia. Continue the Protonix OV in 1 year.

## 2017-07-05 ENCOUNTER — Encounter (HOSPITAL_COMMUNITY): Payer: Self-pay

## 2017-07-05 ENCOUNTER — Inpatient Hospital Stay (HOSPITAL_COMMUNITY)
Admission: EM | Admit: 2017-07-05 | Discharge: 2017-07-07 | DRG: 292 | Disposition: A | Discharge status: Home / Self Care | Payer: PPO | Attending: Internal Medicine | Admitting: Internal Medicine

## 2017-07-05 ENCOUNTER — Other Ambulatory Visit: Payer: Self-pay

## 2017-07-05 ENCOUNTER — Emergency Department (HOSPITAL_COMMUNITY): Payer: PPO

## 2017-07-05 DIAGNOSIS — Z9012 Acquired absence of left breast and nipple: Secondary | ICD-10-CM

## 2017-07-05 DIAGNOSIS — Z7901 Long term (current) use of anticoagulants: Secondary | ICD-10-CM | POA: Diagnosis not present

## 2017-07-05 DIAGNOSIS — R9431 Abnormal electrocardiogram [ECG] [EKG]: Secondary | ICD-10-CM | POA: Diagnosis not present

## 2017-07-05 DIAGNOSIS — K219 Gastro-esophageal reflux disease without esophagitis: Secondary | ICD-10-CM | POA: Diagnosis present

## 2017-07-05 DIAGNOSIS — Z885 Allergy status to narcotic agent status: Secondary | ICD-10-CM | POA: Diagnosis not present

## 2017-07-05 DIAGNOSIS — R05 Cough: Secondary | ICD-10-CM | POA: Diagnosis not present

## 2017-07-05 DIAGNOSIS — R0603 Acute respiratory distress: Secondary | ICD-10-CM | POA: Diagnosis not present

## 2017-07-05 DIAGNOSIS — Z808 Family history of malignant neoplasm of other organs or systems: Secondary | ICD-10-CM | POA: Diagnosis not present

## 2017-07-05 DIAGNOSIS — E785 Hyperlipidemia, unspecified: Secondary | ICD-10-CM | POA: Diagnosis present

## 2017-07-05 DIAGNOSIS — J111 Influenza due to unidentified influenza virus with other respiratory manifestations: Secondary | ICD-10-CM | POA: Diagnosis present

## 2017-07-05 DIAGNOSIS — Z79899 Other long term (current) drug therapy: Secondary | ICD-10-CM | POA: Diagnosis not present

## 2017-07-05 DIAGNOSIS — Z888 Allergy status to other drugs, medicaments and biological substances status: Secondary | ICD-10-CM | POA: Diagnosis not present

## 2017-07-05 DIAGNOSIS — R0602 Shortness of breath: Secondary | ICD-10-CM | POA: Diagnosis not present

## 2017-07-05 DIAGNOSIS — I2510 Atherosclerotic heart disease of native coronary artery without angina pectoris: Secondary | ICD-10-CM | POA: Diagnosis not present

## 2017-07-05 DIAGNOSIS — R0902 Hypoxemia: Secondary | ICD-10-CM | POA: Diagnosis not present

## 2017-07-05 DIAGNOSIS — I480 Paroxysmal atrial fibrillation: Secondary | ICD-10-CM

## 2017-07-05 DIAGNOSIS — I110 Hypertensive heart disease with heart failure: Secondary | ICD-10-CM | POA: Diagnosis not present

## 2017-07-05 DIAGNOSIS — Z8051 Family history of malignant neoplasm of kidney: Secondary | ICD-10-CM | POA: Diagnosis not present

## 2017-07-05 DIAGNOSIS — Z853 Personal history of malignant neoplasm of breast: Secondary | ICD-10-CM | POA: Diagnosis not present

## 2017-07-05 DIAGNOSIS — J209 Acute bronchitis, unspecified: Secondary | ICD-10-CM | POA: Diagnosis present

## 2017-07-05 DIAGNOSIS — Z801 Family history of malignant neoplasm of trachea, bronchus and lung: Secondary | ICD-10-CM

## 2017-07-05 DIAGNOSIS — I481 Persistent atrial fibrillation: Secondary | ICD-10-CM | POA: Diagnosis not present

## 2017-07-05 DIAGNOSIS — C50912 Malignant neoplasm of unspecified site of left female breast: Secondary | ICD-10-CM | POA: Diagnosis present

## 2017-07-05 DIAGNOSIS — G629 Polyneuropathy, unspecified: Secondary | ICD-10-CM | POA: Diagnosis not present

## 2017-07-05 DIAGNOSIS — I509 Heart failure, unspecified: Secondary | ICD-10-CM

## 2017-07-05 DIAGNOSIS — C50911 Malignant neoplasm of unspecified site of right female breast: Secondary | ICD-10-CM | POA: Diagnosis not present

## 2017-07-05 DIAGNOSIS — I4891 Unspecified atrial fibrillation: Secondary | ICD-10-CM | POA: Diagnosis present

## 2017-07-05 DIAGNOSIS — I4581 Long QT syndrome: Secondary | ICD-10-CM | POA: Diagnosis not present

## 2017-07-05 DIAGNOSIS — Z9221 Personal history of antineoplastic chemotherapy: Secondary | ICD-10-CM | POA: Diagnosis not present

## 2017-07-05 DIAGNOSIS — R69 Illness, unspecified: Secondary | ICD-10-CM

## 2017-07-05 DIAGNOSIS — I5023 Acute on chronic systolic (congestive) heart failure: Secondary | ICD-10-CM | POA: Diagnosis not present

## 2017-07-05 LAB — CBC WITH DIFFERENTIAL/PLATELET
BASOS ABS: 0.1 10*3/uL (ref 0.0–0.1)
BASOS PCT: 2 %
EOS PCT: 0 %
Eosinophils Absolute: 0 10*3/uL (ref 0.0–0.7)
HCT: 44.2 % (ref 36.0–46.0)
Hemoglobin: 13.8 g/dL (ref 12.0–15.0)
Lymphocytes Relative: 32 %
Lymphs Abs: 2 10*3/uL (ref 0.7–4.0)
MCH: 28.9 pg (ref 26.0–34.0)
MCHC: 31.2 g/dL (ref 30.0–36.0)
MCV: 92.7 fL (ref 78.0–100.0)
MONO ABS: 0.7 10*3/uL (ref 0.1–1.0)
Monocytes Relative: 12 %
Neutro Abs: 3.4 10*3/uL (ref 1.7–7.7)
Neutrophils Relative %: 54 %
PLATELETS: 221 10*3/uL (ref 150–400)
RBC: 4.77 MIL/uL (ref 3.87–5.11)
RDW: 13.7 % (ref 11.5–15.5)
WBC: 6.3 10*3/uL (ref 4.0–10.5)

## 2017-07-05 LAB — COMPREHENSIVE METABOLIC PANEL
ALBUMIN: 3.7 g/dL (ref 3.5–5.0)
ALT: 10 U/L — ABNORMAL LOW (ref 14–54)
AST: 22 U/L (ref 15–41)
Alkaline Phosphatase: 110 U/L (ref 38–126)
Anion gap: 12 (ref 5–15)
BUN: 12 mg/dL (ref 6–20)
CO2: 26 mmol/L (ref 22–32)
Calcium: 9.3 mg/dL (ref 8.9–10.3)
Chloride: 98 mmol/L — ABNORMAL LOW (ref 101–111)
Creatinine, Ser: 0.86 mg/dL (ref 0.44–1.00)
GFR calc Af Amer: >60 mL/min (ref >60)
GFR calc non Af Amer: >60 mL/min (ref >60)
GLUCOSE: 116 mg/dL — AB (ref 65–99)
POTASSIUM: 3.7 mmol/L (ref 3.5–5.1)
SODIUM: 136 mmol/L (ref 135–145)
Total Bilirubin: 0.9 mg/dL (ref 0.3–1.2)
Total Protein: 7.1 g/dL (ref 6.5–8.1)

## 2017-07-05 LAB — URINALYSIS, ROUTINE W REFLEX MICROSCOPIC
Bilirubin Urine: NEGATIVE
Glucose, UA: NEGATIVE mg/dL
HGB URINE DIPSTICK: NEGATIVE
Ketones, ur: 20 mg/dL — AB
Leukocytes, UA: NEGATIVE
Nitrite: NEGATIVE
Protein, ur: NEGATIVE mg/dL
Specific Gravity, Urine: 1.023 (ref 1.005–1.030)
pH: 5 (ref 5.0–8.0)

## 2017-07-05 LAB — PROCALCITONIN

## 2017-07-05 LAB — I-STAT CG4 LACTIC ACID, ED: Lactic Acid, Venous: 1.13 mmol/L (ref 0.5–1.9)

## 2017-07-05 LAB — TROPONIN I

## 2017-07-05 LAB — INFLUENZA PANEL BY PCR (TYPE A & B)
INFLAPCR: NEGATIVE
INFLBPCR: NEGATIVE

## 2017-07-05 LAB — BRAIN NATRIURETIC PEPTIDE: B Natriuretic Peptide: 477 pg/mL — ABNORMAL HIGH (ref 0.0–100.0)

## 2017-07-05 MED ORDER — ACETAMINOPHEN 325 MG PO TABS: 650.0000 mg | ORAL_TABLET | ORAL | Status: DC | PRN

## 2017-07-05 MED ORDER — SODIUM CHLORIDE 0.9 % IV SOLN
1000.0000 mL | INTRAVENOUS | Status: DC
  Administered 2017-07-05: 1000 mL via INTRAVENOUS

## 2017-07-05 MED ORDER — ONDANSETRON HCL 4 MG/2ML IJ SOLN: 4.0000 mg | Freq: Four times a day (QID) | INTRAMUSCULAR | Status: DC | PRN

## 2017-07-05 MED ORDER — SODIUM CHLORIDE 0.9% FLUSH: 3.0000 mL | INTRAVENOUS | Status: DC | PRN

## 2017-07-05 MED ORDER — DEXTROSE 5 % IV SOLN
500.0000 mg | Freq: Once | INTRAVENOUS | Status: AC
  Administered 2017-07-05: 500 mg via INTRAVENOUS
  Filled 2017-07-05: qty 500

## 2017-07-05 MED ORDER — BRIMONIDINE TARTRATE-TIMOLOL 0.2-0.5 % OP SOLN
1.0000 [drp] | Freq: Every day | OPHTHALMIC | Status: DC
  Filled 2017-07-05: qty 5

## 2017-07-05 MED ORDER — CARVEDILOL 6.25 MG PO TABS
18.3750 mg | ORAL_TABLET | Freq: Two times a day (BID) | ORAL | Status: DC
  Filled 2017-07-05 (×2): qty 1

## 2017-07-05 MED ORDER — BENZONATATE 100 MG PO CAPS: 200.0000 mg | ORAL_CAPSULE | Freq: Three times a day (TID) | ORAL | Status: DC | PRN

## 2017-07-05 MED ORDER — ZOLPIDEM TARTRATE 5 MG PO TABS: 5.0000 mg | ORAL_TABLET | Freq: Every evening | ORAL | Status: DC | PRN

## 2017-07-05 MED ORDER — SODIUM CHLORIDE 0.9 % IV SOLN: 250.0000 mL | INTRAVENOUS | Status: DC | PRN

## 2017-07-05 MED ORDER — FUROSEMIDE 10 MG/ML IJ SOLN
40.0000 mg | INTRAMUSCULAR | Status: AC
  Administered 2017-07-05: 40 mg via INTRAVENOUS
  Filled 2017-07-05: qty 4

## 2017-07-05 MED ORDER — ALBUTEROL SULFATE (2.5 MG/3ML) 0.083% IN NEBU: 2.5000 mg | INHALATION_SOLUTION | RESPIRATORY_TRACT | Status: DC | PRN

## 2017-07-05 MED ORDER — ALBUTEROL (5 MG/ML) CONTINUOUS INHALATION SOLN
10.0000 mg/h | INHALATION_SOLUTION | RESPIRATORY_TRACT | Status: DC
  Administered 2017-07-05: 10 mg/h via RESPIRATORY_TRACT
  Filled 2017-07-05: qty 20

## 2017-07-05 MED ORDER — APIXABAN 5 MG PO TABS
5.0000 mg | ORAL_TABLET | Freq: Two times a day (BID) | ORAL | Status: DC
  Administered 2017-07-05 – 2017-07-07 (×4): 5 mg via ORAL
  Filled 2017-07-05 (×4): qty 1

## 2017-07-05 MED ORDER — FUROSEMIDE 10 MG/ML IJ SOLN: 40.0000 mg | Freq: Two times a day (BID) | INTRAMUSCULAR | Status: DC

## 2017-07-05 MED ORDER — ALBUTEROL SULFATE (2.5 MG/3ML) 0.083% IN NEBU: 2.5000 mg | INHALATION_SOLUTION | Freq: Four times a day (QID) | RESPIRATORY_TRACT | Status: DC | PRN

## 2017-07-05 MED ORDER — PANTOPRAZOLE SODIUM 40 MG PO TBEC: 40.0000 mg | DELAYED_RELEASE_TABLET | Freq: Every day | ORAL | Status: DC

## 2017-07-05 MED ORDER — LOSARTAN POTASSIUM 25 MG PO TABS
25.0000 mg | ORAL_TABLET | Freq: Two times a day (BID) | ORAL | Status: DC
  Administered 2017-07-05 – 2017-07-07 (×4): 25 mg via ORAL
  Filled 2017-07-05 (×4): qty 1

## 2017-07-05 MED ORDER — POTASSIUM CHLORIDE CRYS ER 20 MEQ PO TBCR: 20.0000 meq | EXTENDED_RELEASE_TABLET | Freq: Every day | ORAL | Status: DC

## 2017-07-05 MED ORDER — SODIUM CHLORIDE 0.9% FLUSH
3.0000 mL | Freq: Two times a day (BID) | INTRAVENOUS | Status: DC
  Administered 2017-07-06 – 2017-07-07 (×3): 3 mL via INTRAVENOUS

## 2017-07-05 MED ORDER — DEXTROSE 5 % IV SOLN
1.0000 g | Freq: Once | INTRAVENOUS | Status: AC
  Administered 2017-07-05: 1 g via INTRAVENOUS
  Filled 2017-07-05: qty 10

## 2017-07-05 MED ORDER — POTASSIUM CHLORIDE CRYS ER 20 MEQ PO TBCR
20.0000 meq | EXTENDED_RELEASE_TABLET | Freq: Two times a day (BID) | ORAL | Status: DC
  Administered 2017-07-05 – 2017-07-07 (×4): 20 meq via ORAL
  Filled 2017-07-05 (×2): qty 1
  Filled 2017-07-05: qty 2
  Filled 2017-07-05: qty 1

## 2017-07-05 MED ORDER — LATANOPROSTENE BUNOD 0.024 % OP SOLN: 1.0000 [drp] | Freq: Every day | OPHTHALMIC | Status: DC

## 2017-07-05 MED ORDER — MAGNESIUM SULFATE 2 GM/50ML IV SOLN
2.0000 g | Freq: Once | INTRAVENOUS | Status: AC
  Administered 2017-07-05: 2 g via INTRAVENOUS
  Filled 2017-07-05: qty 50

## 2017-07-05 MED ORDER — ALPRAZOLAM 0.25 MG PO TABS: 0.2500 mg | ORAL_TABLET | Freq: Two times a day (BID) | ORAL | Status: DC | PRN

## 2017-07-05 MED ORDER — METHYLPREDNISOLONE SODIUM SUCC 125 MG IJ SOLR
125.0000 mg | Freq: Once | INTRAMUSCULAR | Status: AC
  Administered 2017-07-05: 125 mg via INTRAVENOUS
  Filled 2017-07-05: qty 2

## 2017-07-05 MED ORDER — NETARSUDIL DIMESYLATE 0.02 % OP SOLN
1.0000 [drp] | Freq: Every day | OPHTHALMIC | Status: DC
  Administered 2017-07-06 – 2017-07-07 (×2): 1 [drp] via OPHTHALMIC

## 2017-07-05 NOTE — ED Notes (Signed)
Pt's son Vania Rosero [phone] cell and [facility]re Matson son house # [phone]

## 2017-07-05 NOTE — ED Provider Notes (Signed)
[facility] EMERGENCY DEPARTMENT Provider Note   CSN: [id number] Arrival date & time: 07/05/17  1729     History   Chief Complaint Chief Complaint  Patient presents with  . Shortness of Breath    HPI [name] is a 83 y.o. female.  HPI  The patient is a pleasant 83 year old female with a known history of vascular disease, atrial fibrillation which is paroxysmal, history of breast cancer on the left status post mastectomy over 15 years ago, history of hyperlipidemia and hypertension.  The patient has had several weeks of worsening cough with shortness of breath and ultimately called her doctor's office today to ask for some cough medicine but because of ongoing and severe dyspnea she requested a visit, they asked her to come to the ER as they did not have room and she was very short of breath.  She denies fevers or swelling of the legs but has had a progressive shortness of breath with tightness and wheezing and coughing which seems to get worse and prevents her from doing much in the way of exertion.  She has not had any specific treatment prior to arrival.  Past Medical History:  Diagnosis Date  . Anxiety   . Arteriosclerotic cardiovascular disease (ASCVD)     cath Feb '07- no obstructive dz - 20% proximal LAD only; EF 65%; possible coronary artery spasm  . Atrial fibrillation (HCC)    Paroxysmal on event recorder in 2009; regular supraventricular tachycardia at a rate of 150 also identified on that study representing either atrial flutter or PSVT; onset of persistent AF in 03/2011  . Carcinoma of breast ([place]) 2001   Left mastectomy with positive nodes  . Chest pain    Long-standing and atypical  . Chronic anticoagulation 04/12/2011  . GERD (gastroesophageal reflux disease)    hiatal hernia  . Herpes zoster   . Hyperlipidemia    Lipid profile in 11/2008:282, 176, 64, 201  . Hypertension    diastolic dysfunction; normal CMet and TSH in 11/2009; normal CBC in 04/2010  .  Malignant neoplasm of breast (female), unspecified site 05/27/2013   right breast lumpectomy with positive node  . Peripheral neuropathy   . Polio 1946   at age 83  . Rectal bleed   . Renal insufficiency   . Shortness of breath dyspnea     Patient Active Problem List   Diagnosis Date Noted  . Malignant neoplasm of upper-inner quadrant of left breast in female, estrogen receptor positive ([place]) 06/05/2017  . Lower extremity edema 05/03/2016  . Early satiety 05/07/2014  . Dysphagia, pharyngoesophageal phase 05/07/2014  . Cough 03/09/2014  . Malignant neoplasm of upper-inner quadrant of right breast in female, estrogen receptor positive ([place]) 06/25/2013  . Bilateral breast cancer ([place]) 05/09/2013  . Wrist fracture, closed 12/10/2012  . Triquetral fracture 10/29/2012  . Sprain of right wrist 10/29/2012  . Chronic anticoagulation 04/12/2011  . Atrial fibrillation ([place]) 03/22/2011  . Arteriosclerotic cardiovascular disease (ASCVD)   . Hyperlipidemia   . Polio   . Hypertension   . GERD (gastroesophageal reflux disease)   . HERPES ZOSTER 11/12/2008  . ANXIETY 07/24/2007  . PERIPHERAL NEUROPATHY 07/24/2007    Past Surgical History:  Procedure Laterality Date  . BREAST LUMPECTOMY WITH NEEDLE LOCALIZATION AND AXILLARY SENTINEL LYMPH NODE BX Right 05/27/2013   Procedure: BREAST LUMPECTOMY WITH NEEDLE LOCALIZATION AND AXILLARY SENTINEL LYMPH NODE BX;  Surgeon: Pedro Earls, MD;  Location: [facility];  Service: General;  Laterality: Right;  . BREAST SURGERY    . CHOLECYSTECTOMY  1999  . COLONOSCOPY  02/22/2011   Procedure: COLONOSCOPY;  Surgeon: Rogene Houston, MD;  [facility];  Service: Endoscopy;  Laterality: N/A;; performed for scant hematochezia  . DILATION AND CURETTAGE OF UTERUS    . ESOPHAGOGASTRODUODENOSCOPY  02/22/2011   Procedure: ESOPHAGOGASTRODUODENOSCOPY (EGD);  Surgeon: Rogene Houston, MD;  [facility];  Service: Endoscopy;   Laterality: N/A;  . ESOPHAGOGASTRODUODENOSCOPY N/A 06/02/2014   Procedure: ESOPHAGOGASTRODUODENOSCOPY (EGD);  Surgeon: Rogene Houston, MD;  [facility];  Service: Endoscopy;  Laterality: N/A;  730  . EYE SURGERY     both cataracts  . MALONEY DILATION N/A 06/02/2014   Procedure: Venia Minks DILATION;  Surgeon: Rogene Houston, MD;  [facility];  Service: Endoscopy;  Laterality: N/A;  . MASTECTOMY  2001   Left;modified radical with lymph node dissection  . PORT-A-CATH REMOVAL     pac in and out 2002    OB History    No data available       Home Medications    Prior to Admission medications   Medication Sig Start Date End Date Taking? Authorizing Provider  apixaban (ELIQUIS) 5 MG TABS tablet Take 1 tablet (5 mg total) by mouth 2 (two) times daily. 12/08/16  Yes Evans Lance, MD  benzonatate (TESSALON) 200 MG capsule Take 200 mg by mouth 3 (three) times daily as needed for cough.   Yes [provider]  carvedilol (COREG) 12.5 MG tablet Take 1.5 tablets (18.75 mg total) by mouth 2 (two) times daily. 05/30/17 08/28/17 Yes Evans Lance, MD  COMBIGAN 0.2-0.5 % ophthalmic solution Place 1 drop into the right eye at bedtime.  08/14/16  Yes [provider]  furosemide (LASIX) 40 MG tablet Take 1 tablet (40 mg total) by mouth daily. Patient taking differently: Take 40 mg by mouth daily. Pt cut her dose to 20 mg daily 05/22/16 07/05/17 Yes Evans Lance, MD  Latanoprostene Bunod (VYZULTA) 0.024 % SOLN Place 1 drop into the right eye at bedtime.    Yes [provider]  losartan (COZAAR) 25 MG tablet Take 1 tablet (25 mg total) by mouth 2 (two) times daily. 11/15/16  Yes Evans Lance, MD  OVER THE COUNTER MEDICATION Take 1 capsule by mouth daily. [employer] eye supplement   Yes [provider]  pantoprazole (PROTONIX) 40 MG tablet TAKE ONE TABLET BY MOUTH DAILY BEFORE BREAKFAST FOR ACID REFLUX. 03/20/17  Yes Setzer, Terri L, NP    exemestane (AROMASIN) 25 MG tablet TAKE 1 TABLET EACH MORNING AFTER BREAKFAST TO PREVENT BREAST CANCER. Patient not taking: Reported on 07/05/2017 05/25/17   Magrinat, Virgie Dad, MD  Netarsudil Dimesylate (RHOPRESSA) 0.02 % SOLN Apply to eye.    [provider]    Family History Family History  Problem Relation Age of Onset  . Cancer Sister        lung  . Cancer Brother        lipoma  . Cancer Brother        kidney    Social History Social History   Tobacco Use  . Smoking status: Never Smoker  . Smokeless tobacco: Never Used  Substance Use Topics  . Alcohol use: No    Alcohol/week: 0.0 oz  . Drug use: No     Allergies   Atorvastatin and Hydrocodone   Review of Systems Review of Systems  All  other systems reviewed and are negative.    Physical Exam Updated Vital Signs BP (!) 142/99 (BP Location: Right Arm)   Pulse (!) 102   Temp 97.8 F (36.6 C) (Oral)   Resp 19   Ht 5\' 1"  (1.549 m)   Wt 80.7 kg (178 lb)   SpO2 91%   BMI 33.63 kg/m   Physical Exam  Constitutional: She appears well-developed and well-nourished. She appears ill. She appears distressed.  HENT:  Head: Normocephalic and atraumatic.  Mouth/Throat: Oropharynx is clear and moist. No oropharyngeal exudate.  Eyes: Conjunctivae and EOM are normal. Pupils are equal, round, and reactive to light. Right eye exhibits no discharge. Left eye exhibits no discharge. No scleral icterus.  Neck: Normal range of motion. Neck supple. No JVD present. No thyromegaly present.  Cardiovascular: Normal heart sounds and intact distal pulses. Exam reveals no gallop and no friction rub.  No murmur heard. Atrial fibrillation with a rapid ventricular rate,  Pulmonary/Chest: Tachypnea noted. She is in respiratory distress. She has wheezes. She has no rales.  The patient is wheezing in all lung fields, she speaks in shortened sentences  Abdominal: Soft. Bowel sounds are normal. She exhibits no distension and no  mass. There is no tenderness.  Musculoskeletal: Normal range of motion. She exhibits no edema or tenderness.  Lymphadenopathy:    She has no cervical adenopathy.  Neurological: She is alert. Coordination normal.  Skin: Skin is warm and dry. No rash noted. No erythema.  Psychiatric: She has a normal mood and affect. Her behavior is normal.  Nursing note and vitals reviewed.    ED Treatments / Results  Labs (all labs ordered are listed, but only abnormal results are displayed) Labs Reviewed  COMPREHENSIVE METABOLIC PANEL - Abnormal; Notable for the following components:      Result Value   Chloride 98 (*)    Glucose, Bld 116 (*)    ALT 10 (*)    All other components within normal limits  BRAIN NATRIURETIC PEPTIDE - Abnormal; Notable for the following components:   B Natriuretic Peptide 477.0 (*)    All other components within normal limits  CULTURE, BLOOD (ROUTINE X 2)  CULTURE, BLOOD (ROUTINE X 2)  CBC WITH DIFFERENTIAL/PLATELET  TROPONIN I  URINALYSIS, ROUTINE W REFLEX MICROSCOPIC  PROCALCITONIN  INFLUENZA PANEL BY PCR (TYPE A & B)  I-STAT CG4 LACTIC ACID, ED  I-STAT CG4 LACTIC ACID, ED    EKG  EKG Interpretation  Date/Time:  Thursday July 05 2017 17:52:51 EST Ventricular Rate:  115 PR Interval:    QRS Duration: 93 QT Interval:  382 QTC Calculation: 529 R Axis:   -158 Text Interpretation:  Atrial fibrillation Low voltage, precordial leads Probable right ventricular hypertrophy Borderline T abnormalities, anterior leads Prolonged QT interval Since last tracing afib now present Confirmed by Noemi Chapel [phone]) on 07/05/2017 6:00:17 PM       Radiology Dg Chest 2 View  Result Date: 07/05/2017 CLINICAL DATA:  Cough, shortness of breath, code sepsis EXAM: CHEST  2 VIEW COMPARISON:  09/30/2015 FINDINGS: Increased interstitial markings. No focal consolidation. No pleural effusion or pneumothorax. Cardiomegaly. Surgical clips in the right lateral chest wall and left  axilla. IMPRESSION: No evidence of acute cardiopulmonary disease. Electronically Signed   By: Julian Hy M.D.   On: 07/05/2017 18:50    Procedures .Critical Care Performed by: Noemi Chapel, MD Authorized by: Noemi Chapel, MD   Critical care provider statement:    Critical care time (minutes):  35   Critical care time was exclusive of:  Separately billable procedures and treating other patients and teaching time   Critical care was necessary to treat or prevent imminent or life-threatening deterioration of the following conditions:  Respiratory failure   Critical care was time spent personally by me on the following activities:  Blood draw for specimens, development of treatment plan with patient or surrogate, discussions with consultants, evaluation of patient's response to treatment, examination of patient, obtaining history from patient or surrogate, ordering and performing treatments and interventions, ordering and review of laboratory studies, ordering and review of radiographic studies, pulse oximetry, re-evaluation of patient's condition and review of old charts   (including critical care time)  Medications Ordered in ED Medications  0.9 %  sodium chloride infusion (1,000 mLs Intravenous New Bag/Given 07/05/17 1826)  azithromycin (ZITHROMAX) 500 mg in dextrose 5 % 250 mL IVPB (not administered)  albuterol (PROVENTIL,VENTOLIN) solution continuous neb (10 mg/hr Nebulization New Bag/Given 07/05/17 1818)  furosemide (LASIX) injection 40 mg (not administered)  cefTRIAXone (ROCEPHIN) 1 g in dextrose 5 % 50 mL IVPB (1 g Intravenous New Bag/Given 07/05/17 1823)  methylPREDNISolone sodium succinate (SOLU-MEDROL) 125 mg/2 mL injection 125 mg (125 mg Intravenous Given 07/05/17 1823)     Initial Impression / Assessment and Plan / ED Course  I have reviewed the triage vital signs and the nursing notes.  Pertinent labs & imaging results that were available during my care of the patient  were reviewed by me and considered in my medical decision making (see chart for details).  Clinical Course as of Jul 05 1941  Thu Jul 05, 2017  1918 The patient has ongoing mild tachycardia, her oxygenation is requiring supplemental oxygen, her lungs are wheezy and her BNP is over 470 suggestive that some component of this could be congestive heart failure which may also explain her persistent hypoxia and tachypnea.  She will most certainly need to be admitted for the persistent hypoxia, she may benefit from some diuresis.  Her last echocardiogram showed an ejection fraction of between 40 and 45%  [BM]  1936 Repeat exam shows ongoing significant wheezing and respiratory distress after continuous nebulizer therapy.  She is continually hypoxic and will need to have admission to the hospital as well as ongoing nebulizer therapy.  Critical care provided for this patient was persistently hypoxic, tachycardic and in respiratory distress  [BM]    Clinical Course User Index [BM] Noemi Chapel, MD    The patient most certainly has a worsening pulmonary condition and given her diffuse wheezing I suspect this is related to more of a bronchitis or pneumonia.  This could be a cardiac wheeze and a BNP will be ordered.  Chest x-ray is ordered.  The patient will need albuterol treatment but because of her hypoxia and tachycardia would also consider this could be sepsis, cultures and antibiotics.  Anticipate admission  Flu sample ordered,  Patient reexamined and has ongoing wheezing  Discussed with hospitalist who will admit.  Patient will need high level of care and continuous nebulizer therapy for ongoing hypoxia and respiratory distress  Appreciate Dr. Myna Hidalgo for his willingness to admit and care for [name]  Final Clinical Impressions(s) / ED Diagnoses   Final diagnoses:  Respiratory distress  Acute congestive heart failure, unspecified heart failure type [facility])    ED Discharge Orders    None        Noemi Chapel, MD 07/05/17 1942

## 2017-07-05 NOTE — ED Triage Notes (Addendum)
Patient reports cough 3 weeks ago. States she went to Dr. Willey Blade for routine blood work. States she received pneumonia shot last thursday . Now having shortness of breath and cough.

## 2017-07-05 NOTE — ED Notes (Signed)
Room 309

## 2017-07-05 NOTE — H&P (Signed)
History and Physical    NISHA DHAMI YBO:[id number] DOB: 22-Jul-1931 DOA: 07/05/2017  PCP: Asencion Noble, MD   Patient coming from: Home  Chief Complaint: SOB, cough   HPI: [name] is a 83 y.o. female with medical history significant for atrial fibrillation on Eliquis, chronic systolic CHF, history of breast cancer with no evidence for recurrence, now presenting to the ED for evaluation of shortness of test 2 weeks.  The patient reports that she had been in her usual state until the insidious development of shortness of breath and cough approximately 2 weeks ago.  Since that time, symptoms have worsened slightly.  She denies fevers or chills, denies chest pain or palpitations, and denies lower extremity swelling or tenderness.  Patient was evaluated for these complaints by her primary care physician, noted to have a new oxygen requirement, and directed to the ED for further evaluation.  ED Course: Upon arrival to the ED, patient is found to be afebrile, saturating high 80s on room air, slightly tachycardic, and with stable blood pressure.  EKG features atrial fibrillation with QTc interval of 529 ms.  Chest x-ray is negative for acute cardiopulmonary disease, but does features some increased interstitial markings.  Chemistry panel and CBC are unremarkable.  Lactic acid is reassuring at 1.13.  Troponin is undetectable and BNP is elevated to 477.  Influenza testing is negative.  Was given 125 mg of IV Solu-Medrol, continuous albuterol treatment, 40 mg IV Lasix, and Rocephin plus azithromycin.  She remains hemodynamically stable, is not in any acute distress, but continues to require supplemental oxygen, and will be admitted to the telemetry unit for ongoing evaluation and suspected acute on chronic systolic CHF.  Review of Systems:  All other systems reviewed and apart from HPI, are negative.  Past Medical History:  Diagnosis Date  . Anxiety   . Arteriosclerotic cardiovascular disease (ASCVD)      cath Feb '07- no obstructive dz - 20% proximal LAD only; EF 65%; possible coronary artery spasm  . Atrial fibrillation (HCC)    Paroxysmal on event recorder in 2009; regular supraventricular tachycardia at a rate of 150 also identified on that study representing either atrial flutter or PSVT; onset of persistent AF in 03/2011  . Carcinoma of breast ([place]) 2001   Left mastectomy with positive nodes  . Chest pain    Long-standing and atypical  . Chronic anticoagulation 04/12/2011  . GERD (gastroesophageal reflux disease)    hiatal hernia  . Herpes zoster   . Hyperlipidemia    Lipid profile in 11/2008:282, 176, 64, 201  . Hypertension    diastolic dysfunction; normal CMet and TSH in 11/2009; normal CBC in 04/2010  . Malignant neoplasm of breast (female), unspecified site 05/27/2013   right breast lumpectomy with positive node  . Peripheral neuropathy   . Polio 1946   at age 30  . Rectal bleed   . Renal insufficiency   . Shortness of breath dyspnea     Past Surgical History:  Procedure Laterality Date  . BREAST LUMPECTOMY WITH NEEDLE LOCALIZATION AND AXILLARY SENTINEL LYMPH NODE BX Right 05/27/2013   Procedure: BREAST LUMPECTOMY WITH NEEDLE LOCALIZATION AND AXILLARY SENTINEL LYMPH NODE BX;  Surgeon: Pedro Earls, MD;  Location: [facility];  Service: General;  Laterality: Right;  . BREAST SURGERY    . CHOLECYSTECTOMY  1999  . COLONOSCOPY  02/22/2011   Procedure: COLONOSCOPY;  Surgeon: Rogene Houston, MD;  [facility];  Service: Endoscopy;  Laterality: N/A;; performed for scant hematochezia  . DILATION AND CURETTAGE OF UTERUS    . ESOPHAGOGASTRODUODENOSCOPY  02/22/2011   Procedure: ESOPHAGOGASTRODUODENOSCOPY (EGD);  Surgeon: Rogene Houston, MD;  [facility];  Service: Endoscopy;  Laterality: N/A;  . ESOPHAGOGASTRODUODENOSCOPY N/A 06/02/2014   Procedure: ESOPHAGOGASTRODUODENOSCOPY (EGD);  Surgeon: Rogene Houston, MD;  [facility];   Service: Endoscopy;  Laterality: N/A;  730  . EYE SURGERY     both cataracts  . MALONEY DILATION N/A 06/02/2014   Procedure: Venia Minks DILATION;  Surgeon: Rogene Houston, MD;  [facility];  Service: Endoscopy;  Laterality: N/A;  . MASTECTOMY  2001   Left;modified radical with lymph node dissection  . PORT-A-CATH REMOVAL     pac in and out 2002     reports that  has never smoked. she has never used smokeless tobacco. She reports that she does not drink alcohol or use drugs.  Allergies  Allergen Reactions  . Atorvastatin Other (See Comments)    Myalgias  . Hydrocodone Other (See Comments)    GI distress.    Family History  Problem Relation Age of Onset  . Cancer Sister        lung  . Cancer Brother        lipoma  . Cancer Brother        kidney     Prior to Admission medications   Medication Sig Start Date End Date Taking? Authorizing Provider  apixaban (ELIQUIS) 5 MG TABS tablet Take 1 tablet (5 mg total) by mouth 2 (two) times daily. 12/08/16  Yes Evans Lance, MD  benzonatate (TESSALON) 200 MG capsule Take 200 mg by mouth 3 (three) times daily as needed for cough.   Yes [provider]  carvedilol (COREG) 12.5 MG tablet Take 1.5 tablets (18.75 mg total) by mouth 2 (two) times daily. 05/30/17 08/28/17 Yes Evans Lance, MD  COMBIGAN 0.2-0.5 % ophthalmic solution Place 1 drop into the right eye at bedtime.  08/14/16  Yes [provider]  furosemide (LASIX) 40 MG tablet Take 1 tablet (40 mg total) by mouth daily. Patient taking differently: Take 40 mg by mouth daily. Pt cut her dose to 20 mg daily 05/22/16 07/05/17 Yes Evans Lance, MD  Latanoprostene Bunod (VYZULTA) 0.024 % SOLN Place 1 drop into the right eye at bedtime.    Yes [provider]  losartan (COZAAR) 25 MG tablet Take 1 tablet (25 mg total) by mouth 2 (two) times daily. 11/15/16  Yes Evans Lance, MD  OVER THE COUNTER MEDICATION Take 1 capsule by mouth daily. [facility]  eye supplement   Yes [provider]  pantoprazole (PROTONIX) 40 MG tablet TAKE ONE TABLET BY MOUTH DAILY BEFORE BREAKFAST FOR ACID REFLUX. 03/20/17  Yes Setzer, Terri L, NP  exemestane (AROMASIN) 25 MG tablet TAKE 1 TABLET EACH MORNING AFTER BREAKFAST TO PREVENT BREAST CANCER. Patient not taking: Reported on 07/05/2017 05/25/17   Magrinat, Virgie Dad, MD  Netarsudil Dimesylate (RHOPRESSA) 0.02 % SOLN Apply to eye.    [provider]    Physical Exam: Vitals:   07/05/17 1742 07/05/17 1815 07/05/17 1818 07/05/17 1930  BP:    (!) 129/99  Pulse:  (!) 102  97  Resp:  19  14  Temp:      TempSrc:      SpO2: 92% 94% 91% 97%  Weight:      Height:  Constitutional: NAD, calm  Eyes: PERTLA, lids and conjunctivae normal ENMT: Mucous membranes are moist. Posterior pharynx clear of any exudate or lesions.   Neck: normal, supple, no masses, no thyromegaly Respiratory: Occasional wheeze bilaterally. Normal respiratory effort. No accessory muscle use.  Cardiovascular: Rate ~100 and irregular. Trace pretibial edema bilaterally. JVP 9 mmH2O. Abdomen: No distension, no tenderness, no masses palpated. Bowel sounds normal.  Musculoskeletal: no clubbing / cyanosis. No joint deformity upper and lower extremities.    Skin: no significant rashes, lesions, ulcers. Warm, dry, well-perfused. Neurologic: CN 2-12 grossly intact. Sensation intact. Strength 5/5 in all 4 limbs.  Psychiatric: Alert and oriented x 3. Calm, cooperative.     Labs on Admission: I have personally reviewed following labs and imaging studies  CBC: Recent Labs  Lab 07/05/17 1801  WBC 6.3  NEUTROABS 3.4  HGB 13.8  HCT 44.2  MCV 92.7  PLT 629   Basic Metabolic Panel: Recent Labs  Lab 07/05/17 1801  NA 136  K 3.7  CL 98*  CO2 26  GLUCOSE 116*  BUN 12  CREATININE 0.86  CALCIUM 9.3   GFR: Estimated Creatinine Clearance: 47.7 mL/min (by C-G formula based on SCr of 0.86 mg/dL). Liver Function  Tests: Recent Labs  Lab 07/05/17 1801  AST 22  ALT 10*  ALKPHOS 110  BILITOT 0.9  PROT 7.1  ALBUMIN 3.7   No results for input(s): LIPASE, AMYLASE in the last 168 hours. No results for input(s): AMMONIA in the last 168 hours. Coagulation Profile: No results for input(s): INR, PROTIME in the last 168 hours. Cardiac Enzymes: Recent Labs  Lab 07/05/17 1801  TROPONINI <0.03   BNP (last 3 results) No results for input(s): PROBNP in the last 8760 hours. HbA1C: No results for input(s): HGBA1C in the last 72 hours. CBG: No results for input(s): GLUCAP in the last 168 hours. Lipid Profile: No results for input(s): CHOL, HDL, LDLCALC, TRIG, CHOLHDL, LDLDIRECT in the last 72 hours. Thyroid Function Tests: No results for input(s): TSH, T4TOTAL, FREET4, T3FREE, THYROIDAB in the last 72 hours. Anemia Panel: No results for input(s): VITAMINB12, FOLATE, FERRITIN, TIBC, IRON, RETICCTPCT in the last 72 hours. Urine analysis:    Component Value Date/Time   COLORURINE LT. YELLOW 11/12/2008 1508   APPEARANCEUR CLEAR 11/12/2008 1508   LABSPEC >=1.030 11/12/2008 1508   PHURINE 5.0 11/12/2008 1508   GLUCOSEU NEGATIVE 11/12/2008 1508   BILIRUBINUR NEGATIVE 11/12/2008 1508   KETONESUR NEGATIVE 11/12/2008 1508   UROBILINOGEN 0.2 11/12/2008 1508   NITRITE NEGATIVE 11/12/2008 1508   LEUKOCYTESUR SMALL 11/12/2008 1508   Sepsis Labs: @LABRCNTIP (procalcitonin:4,lacticidven:4) )No results found for this or any previous visit (from the past 240 hour(s)).   Radiological Exams on Admission: Dg Chest 2 View  Result Date: 07/05/2017 CLINICAL DATA:  Cough, shortness of breath, code sepsis EXAM: CHEST  2 VIEW COMPARISON:  09/30/2015 FINDINGS: Increased interstitial markings. No focal consolidation. No pleural effusion or pneumothorax. Cardiomegaly. Surgical clips in the right lateral chest wall and left axilla. IMPRESSION: No evidence of acute cardiopulmonary disease. Electronically Signed   By:  Julian Hy M.D.   On: 07/05/2017 18:50    EKG: Independently reviewed. Atrial fibrillation, QTc 529 ms.   Assessment/Plan  1. Acute on chronic systolic CHF; hypoxia  - Presents with 2 weeks of dyspnea and cough, found to be hypoxic on room air  - There is elevated BNP, only trace LE edema, mild elevation in JVP - Echo from November 2017 with EF 40-45%, moderate TR,  severe LAE  - Treated in ED with Lasix 40 mg IV  - Continue cardiac monitoring, SLIV, fluid-restrict diet, follow daily wts and I/O's, continue diuresis with Lasix 40 mg IV q12h, continue losartan and Coreg, update echo    2. Atrial fibrillation  - In rate-controlled a fib on admission  - CHADS-VASc is 27 (age x2, gender, CHF)  - Continue Eliquis and Coreg    3. Breast cancer  - Status-post mastectomy and adjuvant chemotherapy  - Continues to follow with oncology and no evidence for recurrence per recent notes   4. Prolonged QT - QTc interval is 529 ms on admission  - Continue cardiac monitoring, check mag level and replace to 2, replace potassium to 4, limit offending medications    DVT prophylaxis: Eliquis  Code Status: Full  Family Communication: Discussed with patient Disposition Plan: Admit to telemetry Consults called: None Admission status: Inpatient    Vianne Bulls, MD [facility] Pager [phone]  If 7PM-7AM, please contact night-coverage [email or web address] Password [facility]  07/05/2017, 8:09 PM

## 2017-07-06 ENCOUNTER — Inpatient Hospital Stay (HOSPITAL_COMMUNITY): Payer: PPO

## 2017-07-06 ENCOUNTER — Encounter (HOSPITAL_COMMUNITY): Payer: Self-pay | Admitting: *Deleted

## 2017-07-06 LAB — BASIC METABOLIC PANEL
ANION GAP: 14 (ref 5–15)
BUN: 13 mg/dL (ref 6–20)
CALCIUM: 8.8 mg/dL — AB (ref 8.9–10.3)
CO2: 25 mmol/L (ref 22–32)
Chloride: 96 mmol/L — ABNORMAL LOW (ref 101–111)
Creatinine, Ser: 0.7 mg/dL (ref 0.44–1.00)
Glucose, Bld: 144 mg/dL — ABNORMAL HIGH (ref 65–99)
Potassium: 3.7 mmol/L (ref 3.5–5.1)
Sodium: 135 mmol/L (ref 135–145)

## 2017-07-06 LAB — MAGNESIUM: Magnesium: 1.9 mg/dL (ref 1.7–2.4)

## 2017-07-06 MED ORDER — TIMOLOL MALEATE 0.5 % OP SOLN: 1.0000 [drp] | Freq: Every day | OPHTHALMIC | Status: DC

## 2017-07-06 MED ORDER — BRIMONIDINE TARTRATE 0.2 % OP SOLN: 1.0000 [drp] | Freq: Every day | OPHTHALMIC | Status: DC

## 2017-07-06 MED ORDER — BENZONATATE 100 MG PO CAPS
200.0000 mg | ORAL_CAPSULE | Freq: Three times a day (TID) | ORAL | Status: DC
  Administered 2017-07-06 – 2017-07-07 (×3): 200 mg via ORAL
  Filled 2017-07-06 (×3): qty 2

## 2017-07-06 MED ORDER — METHYLPREDNISOLONE SODIUM SUCC 125 MG IJ SOLR
60.0000 mg | Freq: Every day | INTRAMUSCULAR | Status: DC
  Administered 2017-07-06 – 2017-07-07 (×2): 60 mg via INTRAVENOUS
  Filled 2017-07-06 (×2): qty 2

## 2017-07-06 MED ORDER — FUROSEMIDE 40 MG PO TABS
40.0000 mg | ORAL_TABLET | Freq: Every day | ORAL | Status: DC
Start: 2017-07-07 — End: 2017-07-07
  Administered 2017-07-07: 40 mg via ORAL
  Filled 2017-07-06: qty 1

## 2017-07-06 MED ORDER — LEVALBUTEROL HCL 0.63 MG/3ML IN NEBU
0.6300 mg | INHALATION_SOLUTION | Freq: Four times a day (QID) | RESPIRATORY_TRACT | Status: DC
  Administered 2017-07-06 – 2017-07-07 (×3): 0.63 mg via RESPIRATORY_TRACT
  Filled 2017-07-06 (×3): qty 3

## 2017-07-06 MED ORDER — IPRATROPIUM BROMIDE 0.02 % IN SOLN
0.5000 mg | Freq: Four times a day (QID) | RESPIRATORY_TRACT | Status: DC
  Administered 2017-07-06 – 2017-07-07 (×3): 0.5 mg via RESPIRATORY_TRACT
  Filled 2017-07-06 (×3): qty 2.5

## 2017-07-06 MED ORDER — GUAIFENESIN ER 600 MG PO TB12
1200.0000 mg | ORAL_TABLET | Freq: Two times a day (BID) | ORAL | Status: DC
  Administered 2017-07-06 – 2017-07-07 (×2): 1200 mg via ORAL
  Filled 2017-07-06 (×2): qty 2

## 2017-07-06 MED ORDER — CARVEDILOL 3.125 MG PO TABS
18.7500 mg | ORAL_TABLET | Freq: Two times a day (BID) | ORAL | Status: DC
  Administered 2017-07-06 – 2017-07-07 (×3): 18.75 mg via ORAL
  Filled 2017-07-06 (×3): qty 2

## 2017-07-06 NOTE — Progress Notes (Signed)
PROGRESS NOTE    [name]  WUJ:[id number] DOB: 11-17-1931 DOA: 07/05/2017 PCP: Asencion Noble, MD    Brief Narrative:  83 year old female with a history of systolic CHF, atrial fibrillation on anticoagulation, presented to the hospital with persistent cough, wheezing and shortness of breath.  Initially felt to have CHF exacerbation and started on intravenous Lasix.  On further evaluation, it appears that she may have a bronchitis.  She has been started on steroids bronchodilators and mucolytic's.   Assessment & Plan:   Principal Problem:   Acute on chronic systolic CHF (congestive heart failure) (HCC) Active Problems:   Atrial fibrillation (HCC)   Bilateral breast cancer (HCC)   Influenza-like illness   Prolonged QT interval   1. Acute bronchitis.  Patient presents with cough, wheezing, shortness of breath.  Will start on steroids, bronchodilators and pulmonary hygiene. 2. Chronic systolic CHF.  EF of 40-45%.  She was treated with intravenous Lasix and has had approximately 1.7 L of urine out.  She does not feel any better despite diuresis.  Family reports that her lower extremity edema is near her baseline.  We will continue her on her outpatient dose of Lasix. 3. Atrial fibrillation.  Rate controlled.  Continue Coreg.  Anticoagulated with Eliquis. 4. Breast cancer.  Status post mastectomy and adjuvant chemotherapy.  Continue to follow with oncology.   DVT prophylaxis: Eliquis Code Status: Full code Family Communication: Discussed with son at the bedside Disposition Plan: Discharge home once improved   Consultants:     Procedures:     Antimicrobials:      Subjective: Continues to have cough, wheezing and feels short of breath  Objective: Vitals:   07/05/17 2100 07/05/17 2220 07/06/17 0613 07/06/17 1500  BP: 128/89 (!) 147/94 138/71 (!) 89/47  Pulse: (!) 101 (!) 104 (!) 101 90  Resp: 13 16 16 19   Temp:  97.9 F (36.6 C) 98 F (36.7 C) 98.2 F (36.8 C)    TempSrc:  Oral Oral Oral  SpO2: 96% 93% 94% 92%  Weight:  80.7 kg (177 lb 14.6 oz)    Height:   5\' 1"  (1.549 m)     Intake/Output Summary (Last 24 hours) at 07/06/2017 1831 Last data filed at 07/06/2017 9562 Gross per 24 hour  Intake 420 ml  Output 1750 ml  Net -1330 ml   Filed Weights   07/05/17 1741 07/05/17 2220  Weight: 80.7 kg (178 lb) 80.7 kg (177 lb 14.6 oz)    Examination:  General exam: Appears calm and comfortable  Respiratory system: Mild wheeze in upper airways. Respiratory effort normal. Cardiovascular system: S1 & S2 heard, RRR. No JVD, murmurs, rubs, gallops or clicks.  1+ pedal edema. Gastrointestinal system: Abdomen is nondistended, soft and nontender. No organomegaly or masses felt. Normal bowel sounds heard. Central nervous system: Alert and oriented. No focal neurological deficits. Extremities: Symmetric 5 x 5 power. Skin: Venous stasis changes in lower extremities bilaterally Psychiatry: Judgement and insight appear normal. Mood & affect appropriate.     Data Reviewed: I have personally reviewed following labs and imaging studies  CBC: Recent Labs  Lab 07/05/17 1801  WBC 6.3  NEUTROABS 3.4  HGB 13.8  HCT 44.2  MCV 92.7  PLT 130   Basic Metabolic Panel: Recent Labs  Lab 07/05/17 1801 07/06/17 0631  NA 136 135  K 3.7 3.7  CL 98* 96*  CO2 26 25  GLUCOSE 116* 144*  BUN 12 13  CREATININE 0.86 0.70  CALCIUM 9.3  8.8*  MG  --  1.9   GFR: Estimated Creatinine Clearance: 51.3 mL/min (by C-G formula based on SCr of 0.7 mg/dL). Liver Function Tests: Recent Labs  Lab 07/05/17 1801  AST 22  ALT 10*  ALKPHOS 110  BILITOT 0.9  PROT 7.1  ALBUMIN 3.7   No results for input(s): LIPASE, AMYLASE in the last 168 hours. No results for input(s): AMMONIA in the last 168 hours. Coagulation Profile: No results for input(s): INR, PROTIME in the last 168 hours. Cardiac Enzymes: Recent Labs  Lab 07/05/17 1801  TROPONINI <0.03   BNP (last 3  results) No results for input(s): PROBNP in the last 8760 hours. HbA1C: No results for input(s): HGBA1C in the last 72 hours. CBG: No results for input(s): GLUCAP in the last 168 hours. Lipid Profile: No results for input(s): CHOL, HDL, LDLCALC, TRIG, CHOLHDL, LDLDIRECT in the last 72 hours. Thyroid Function Tests: No results for input(s): TSH, T4TOTAL, FREET4, T3FREE, THYROIDAB in the last 72 hours. Anemia Panel: No results for input(s): VITAMINB12, FOLATE, FERRITIN, TIBC, IRON, RETICCTPCT in the last 72 hours. Sepsis Labs: Recent Labs  Lab 07/05/17 1801 07/05/17 1817  PROCALCITON <0.10  --   LATICACIDVEN  --  1.13    Recent Results (from the past 240 hour(s))  Blood Culture (routine x 2)     Status: None (Preliminary result)   Collection Time: 07/05/17  6:01 PM  Result Value Ref Range Status   Specimen Description BLOOD RIGHT FOREARM  Final   Special Requests   Final    BOTTLES DRAWN AEROBIC AND ANAEROBIC Blood Culture adequate volume   Culture NO GROWTH < 12 HOURS  Final   Report Status PENDING  Incomplete  Blood Culture (routine x 2)     Status: None (Preliminary result)   Collection Time: 07/05/17  6:01 PM  Result Value Ref Range Status   Specimen Description RIGHT ANTECUBITAL  Final   Special Requests   Final    BOTTLES DRAWN AEROBIC AND ANAEROBIC Blood Culture adequate volume   Culture NO GROWTH < 12 HOURS  Final   Report Status PENDING  Incomplete         Radiology Studies: Dg Chest 2 View  Result Date: 07/05/2017 CLINICAL DATA:  Cough, shortness of breath, code sepsis EXAM: CHEST  2 VIEW COMPARISON:  09/30/2015 FINDINGS: Increased interstitial markings. No focal consolidation. No pleural effusion or pneumothorax. Cardiomegaly. Surgical clips in the right lateral chest wall and left axilla. IMPRESSION: No evidence of acute cardiopulmonary disease. Electronically Signed   By: Julian Hy M.D.   On: 07/05/2017 18:50        Scheduled Meds: . apixaban   5 mg Oral BID  . benzonatate  200 mg Oral TID  . brimonidine  1 drop Right Eye QHS   And  . timolol  1 drop Right Eye QHS  . carvedilol  18.75 mg Oral BID WC  . guaiFENesin  1,200 mg Oral BID  . ipratropium  0.5 mg Nebulization Q6H  . Latanoprostene Bunod  1 drop Right Eye QHS  . levalbuterol  0.63 mg Nebulization Q6H  . losartan  25 mg Oral BID  . methylPREDNISolone (SOLU-MEDROL) injection  60 mg Intravenous Daily  . Netarsudil Dimesylate  1 drop Ophthalmic Daily  . potassium chloride  20 mEq Oral BID  . sodium chloride flush  3 mL Intravenous Q12H   Continuous Infusions: . sodium chloride       LOS: 1 day    Time  spent: 80mins    Kathie Dike, MD [facility] Pager [phone]  If 7PM-7AM, please contact night-coverage [email or web address] Password [facility] 07/06/2017, 6:31 PM

## 2017-07-06 NOTE — Care Management Note (Signed)
Case Management Note  Patient Details  Name: [name] MRN: [id number] Date of Birth: October 20, 1931  Subjective/Objective:  Adm with CHF. From home alone, has 2 sons that are very supportive. She is independent with ADL's. No DME or HH pta. She has PCP, Dr. Willey Blade, still drives to appointments, reports no issues affording medications. She reports she takes 1/2 pill for fluid retention prn. She does have scales, weighs daily. Discussed [facility] eligibility. Patient declines [facility] services and/or [facility] calls.                   Action/Plan: DC home with self care. Not appropriated for Home health as she is not home bound. Declined [facility] services.   Expected Discharge Date:     07/07/2017             Expected Discharge Plan:  Home/Self Care  In-House Referral:     Discharge planning Services  CM Consult  Post Acute Care Choice:  NA Choice offered to:  NA  DME Arranged:    DME Agency:     HH Arranged:    HH Agency:     Status of Service:  Completed, signed off  If discussed at [employer] of Stay Meetings, dates discussed:    Additional Comments:  Orvie Caradine, Chauncey Reading, RN 07/06/2017, 10:12 AM

## 2017-07-07 DIAGNOSIS — R69 Illness, unspecified: Secondary | ICD-10-CM

## 2017-07-07 LAB — BASIC METABOLIC PANEL
ANION GAP: 11 (ref 5–15)
BUN: 24 mg/dL — ABNORMAL HIGH (ref 6–20)
CALCIUM: 8.8 mg/dL — AB (ref 8.9–10.3)
CO2: 25 mmol/L (ref 22–32)
Chloride: 99 mmol/L — ABNORMAL LOW (ref 101–111)
Creatinine, Ser: 0.76 mg/dL (ref 0.44–1.00)
GLUCOSE: 147 mg/dL — AB (ref 65–99)
POTASSIUM: 4.3 mmol/L (ref 3.5–5.1)
Sodium: 135 mmol/L (ref 135–145)

## 2017-07-07 MED ORDER — CARVEDILOL 3.125 MG PO TABS
6.2500 mg | ORAL_TABLET | ORAL | Status: DC
  Filled 2017-07-07: qty 2

## 2017-07-07 MED ORDER — ALBUTEROL SULFATE HFA 108 (90 BASE) MCG/ACT IN AERS: 2.0000 | INHALATION_SPRAY | Freq: Four times a day (QID) | RESPIRATORY_TRACT | 2 refills | Status: DC | PRN

## 2017-07-07 MED ORDER — GUAIFENESIN ER 600 MG PO TB12: 600.0000 mg | ORAL_TABLET | Freq: Two times a day (BID) | ORAL | 0 refills | Status: DC

## 2017-07-07 MED ORDER — PREDNISONE 10 MG PO TABS: ORAL_TABLET | ORAL | 0 refills | Status: DC

## 2017-07-07 MED ORDER — CARVEDILOL 25 MG PO TABS: 25.0000 mg | ORAL_TABLET | Freq: Two times a day (BID) | ORAL | 3 refills | Status: DC

## 2017-07-07 NOTE — Progress Notes (Signed)
Pt ambulated approximately 100 feet in hallway. Tolerated well. O2 saturation's fluctuated between 91-93% on room air.

## 2017-07-07 NOTE — Discharge Summary (Signed)
Physician Discharge Summary  [name] ZOX:[id number] DOB: [birth year] DOA: 07/05/2017  PCP: Asencion Noble, MD  Admit date: 07/05/2017 Discharge date: 07/07/2017  Admitted From: Home Disposition: Home  Recommendations for Outpatient Follow-up:  1. Follow up with PCP in 1-2 weeks 2. Please obtain BMP/CBC in one week 3. Follow-up with primary cardiologist in the next 1-2 weeks  Home Health: Equipment/Devices:  Discharge Condition: Stable CODE STATUS: Full code Diet recommendation: Heart Healthy   Brief/Interim Summary: 83 year old female with a history of systolic CHF, EF of 81-19%, atrial fibrillation on anticoagulation, admitted to the hospital with worsening cough, wheezing and shortness of breath.  She was initially felt to have decompensated CHF and started on intravenous Lasix.  On further evaluation, it was felt that she likely has an acute bronchitis.  Chest x-ray did not show any significant edema.  She was diuresed 1.7 L of urine I did not have any improvement of her shortness of breath.  She was started on bronchodilators and intravenous steroids with significant improvement of wheezing, cough, shortness of breath.  Her atrial fibrillation was somewhat uncontrolled.  Heart rate was ranging between the 110-120 range.  She was asymptomatic.  It was recommended that her Coreg dose increased to 25 mg twice daily.  Patient did not wish to make any changes in her medications and wanted to follow-up with her primary cardiologist before altering her medication regimen.  She is feeling well at this time and is very adamant about being discharged home.  She will be sent home on a prednisone taper and albuterol inhaler.  I have also sent a prescription for Coreg 25 mg twice daily to her pharmacy if she chooses to fill it.  Discharge Diagnoses:  Principal Problem:   Acute on chronic systolic CHF (congestive heart failure) (HCC) Active Problems:   Atrial fibrillation (HCC)   Bilateral  breast cancer (HCC)   Influenza-like illness   Prolonged QT interval    Discharge Instructions   Allergies as of 07/07/2017      Reactions   Atorvastatin Other (See Comments)   Myalgias   Hydrocodone Other (See Comments)   GI distress.      Medication List    TAKE these medications   albuterol 108 (90 Base) MCG/ACT inhaler Commonly known as:  PROVENTIL HFA;VENTOLIN HFA Inhale 2 puffs into the lungs every 6 (six) hours as needed for wheezing or shortness of breath.   apixaban 5 MG Tabs tablet Commonly known as:  ELIQUIS Take 1 tablet (5 mg total) by mouth 2 (two) times daily.   benzonatate 200 MG capsule Commonly known as:  TESSALON Take 200 mg by mouth 3 (three) times daily as needed for cough.   carvedilol 25 MG tablet Commonly known as:  COREG Take 1 tablet (25 mg total) by mouth 2 (two) times daily. What changed:    medication strength  how much to take   COMBIGAN 0.2-0.5 % ophthalmic solution Generic drug:  brimonidine-timolol Place 1 drop into the right eye at bedtime.   exemestane 25 MG tablet Commonly known as:  AROMASIN TAKE 1 TABLET EACH MORNING AFTER BREAKFAST TO PREVENT BREAST CANCER.   furosemide 40 MG tablet Commonly known as:  LASIX Take 1 tablet (40 mg total) by mouth daily. What changed:  additional instructions   guaiFENesin 600 MG 12 hr tablet Commonly known as:  MUCINEX Take 1 tablet (600 mg total) by mouth 2 (two) times daily.   losartan 25 MG tablet Commonly known as:  COZAAR  Take 1 tablet (25 mg total) by mouth 2 (two) times daily.   OVER THE COUNTER MEDICATION Take 1 capsule by mouth daily. [employer] eye supplement   pantoprazole 40 MG tablet Commonly known as:  PROTONIX TAKE ONE TABLET BY MOUTH DAILY BEFORE BREAKFAST FOR ACID REFLUX.   predniSONE 10 MG tablet Commonly known as:  DELTASONE Take 40mg  po daily for 2 days then 30mg  daily for 2 days then 20mg  daily for 2 days then 10mg  daily for 2 days then stop    RHOPRESSA 0.02 % Soln Generic drug:  Netarsudil Dimesylate Apply to eye.   VYZULTA 0.024 % Soln Generic drug:  Latanoprostene Bunod Place 1 drop into the right eye at bedtime.       Allergies  Allergen Reactions  . Atorvastatin Other (See Comments)    Myalgias  . Hydrocodone Other (See Comments)    GI distress.    Consultations:     Procedures/Studies: Dg Chest 2 View  Result Date: 07/05/2017 CLINICAL DATA:  Cough, shortness of breath, code sepsis EXAM: CHEST  2 VIEW COMPARISON:  09/30/2015 FINDINGS: Increased interstitial markings. No focal consolidation. No pleural effusion or pneumothorax. Cardiomegaly. Surgical clips in the right lateral chest wall and left axilla. IMPRESSION: No evidence of acute cardiopulmonary disease. Electronically Signed   By: Julian Hy M.D.   On: 07/05/2017 18:50       Subjective: Feeling better today.  Cough and shortness of breath is better.  Wheezing is also better.  Wants to go home.  Discharge Exam: Vitals:   07/07/17 1149 07/07/17 1323  BP:  114/76  Pulse:  (!) 107  Resp:  18  Temp:    SpO2: 92% 92%   Vitals:   07/07/17 0518 07/07/17 0753 07/07/17 1149 07/07/17 1323  BP: (!) 110/59   114/76  Pulse: 72   (!) 107  Resp: 20   18  Temp: 98.4 F (36.9 C)     TempSrc: Oral     SpO2: 97% 93% 92% 92%  Weight: 80.7 kg (177 lb 14.9 oz)     Height:        General: Pt is alert, awake, not in acute distress Cardiovascular: Irregular, S1/S2 +, no rubs, no gallops Respiratory: CTA bilaterally, no wheezing, no rhonchi Abdominal: Soft, NT, ND, bowel sounds + Extremities: Trace to 1+ edema, no cyanosis    The results of significant diagnostics from this hospitalization (including imaging, microbiology, ancillary and laboratory) are listed below for reference.     Microbiology: Recent Results (from the past 240 hour(s))  Blood Culture (routine x 2)     Status: None (Preliminary result)   Collection Time: 07/05/17  6:01  PM  Result Value Ref Range Status   Specimen Description BLOOD RIGHT FOREARM  Final   Special Requests   Final    BOTTLES DRAWN AEROBIC AND ANAEROBIC Blood Culture adequate volume   Culture NO GROWTH 2 DAYS  Final   Report Status PENDING  Incomplete  Blood Culture (routine x 2)     Status: None (Preliminary result)   Collection Time: 07/05/17  6:01 PM  Result Value Ref Range Status   Specimen Description RIGHT ANTECUBITAL  Final   Special Requests   Final    BOTTLES DRAWN AEROBIC AND ANAEROBIC Blood Culture adequate volume   Culture NO GROWTH 2 DAYS  Final   Report Status PENDING  Incomplete     Labs: BNP (last 3 results) Recent Labs    07/05/17 1801  BNP  527.7*   Basic Metabolic Panel: Recent Labs  Lab 07/05/17 1801 07/06/17 0631 07/07/17 0625  NA 136 135 135  K 3.7 3.7 4.3  CL 98* 96* 99*  CO2 26 25 25   GLUCOSE 116* 144* 147*  BUN 12 13 24*  CREATININE 0.86 0.70 0.76  CALCIUM 9.3 8.8* 8.8*  MG  --  1.9  --    Liver Function Tests: Recent Labs  Lab 07/05/17 1801  AST 22  ALT 10*  ALKPHOS 110  BILITOT 0.9  PROT 7.1  ALBUMIN 3.7   No results for input(s): LIPASE, AMYLASE in the last 168 hours. No results for input(s): AMMONIA in the last 168 hours. CBC: Recent Labs  Lab 07/05/17 1801  WBC 6.3  NEUTROABS 3.4  HGB 13.8  HCT 44.2  MCV 92.7  PLT 221   Cardiac Enzymes: Recent Labs  Lab 07/05/17 1801  TROPONINI <0.03   BNP: Invalid input(s): POCBNP CBG: No results for input(s): GLUCAP in the last 168 hours. D-Dimer No results for input(s): DDIMER in the last 72 hours. Hgb A1c No results for input(s): HGBA1C in the last 72 hours. Lipid Profile No results for input(s): CHOL, HDL, LDLCALC, TRIG, CHOLHDL, LDLDIRECT in the last 72 hours. Thyroid function studies No results for input(s): TSH, T4TOTAL, T3FREE, THYROIDAB in the last 72 hours.  Invalid input(s): FREET3 Anemia work up No results for input(s): VITAMINB12, FOLATE, FERRITIN, TIBC,  IRON, RETICCTPCT in the last 72 hours. Urinalysis    Component Value Date/Time   COLORURINE YELLOW 07/05/2017 2125   APPEARANCEUR CLEAR 07/05/2017 2125   LABSPEC 1.023 07/05/2017 2125   PHURINE 5.0 07/05/2017 2125   GLUCOSEU NEGATIVE 07/05/2017 2125   GLUCOSEU NEGATIVE 11/12/2008 1508   HGBUR NEGATIVE 07/05/2017 2125   BILIRUBINUR NEGATIVE 07/05/2017 2125   KETONESUR 20 (A) 07/05/2017 2125   PROTEINUR NEGATIVE 07/05/2017 2125   UROBILINOGEN 0.2 11/12/2008 1508   NITRITE NEGATIVE 07/05/2017 2125   LEUKOCYTESUR NEGATIVE 07/05/2017 2125   Sepsis Labs Invalid input(s): PROCALCITONIN,  WBC,  LACTICIDVEN Microbiology Recent Results (from the past 240 hour(s))  Blood Culture (routine x 2)     Status: None (Preliminary result)   Collection Time: 07/05/17  6:01 PM  Result Value Ref Range Status   Specimen Description BLOOD RIGHT FOREARM  Final   Special Requests   Final    BOTTLES DRAWN AEROBIC AND ANAEROBIC Blood Culture adequate volume   Culture NO GROWTH 2 DAYS  Final   Report Status PENDING  Incomplete  Blood Culture (routine x 2)     Status: None (Preliminary result)   Collection Time: 07/05/17  6:01 PM  Result Value Ref Range Status   Specimen Description RIGHT ANTECUBITAL  Final   Special Requests   Final    BOTTLES DRAWN AEROBIC AND ANAEROBIC Blood Culture adequate volume   Culture NO GROWTH 2 DAYS  Final   Report Status PENDING  Incomplete     Time coordinating discharge: Over 30 minutes  SIGNED:   Kathie Dike, MD  [facility] 07/07/2017, 6:24 PM Pager   If 7PM-7AM, please contact night-coverage [email or web address] Password [email or web address]

## 2017-07-07 NOTE — Progress Notes (Signed)
IV removed, WNL. D/C instructions given to pt. Verbalized understanding. Pt refused the 1445 Coreg dose as she was hesitant to increase her dosage. States she will follow up with her heart doctor to manage meds. Pt awaiting family member to transport home.

## 2017-07-10 LAB — CULTURE, BLOOD (ROUTINE X 2)
Culture: NO GROWTH
Culture: NO GROWTH
Special Requests: ADEQUATE
Special Requests: ADEQUATE

## 2017-07-12 DIAGNOSIS — J40 Bronchitis, not specified as acute or chronic: Secondary | ICD-10-CM | POA: Diagnosis not present

## 2017-07-12 DIAGNOSIS — B379 Candidiasis, unspecified: Secondary | ICD-10-CM | POA: Diagnosis not present

## 2017-07-26 DIAGNOSIS — H401112 Primary open-angle glaucoma, right eye, moderate stage: Secondary | ICD-10-CM | POA: Diagnosis not present

## 2017-08-16 DIAGNOSIS — H3561 Retinal hemorrhage, right eye: Secondary | ICD-10-CM | POA: Diagnosis not present

## 2017-08-16 DIAGNOSIS — H353211 Exudative age-related macular degeneration, right eye, with active choroidal neovascularization: Secondary | ICD-10-CM | POA: Diagnosis not present

## 2017-08-16 DIAGNOSIS — H401112 Primary open-angle glaucoma, right eye, moderate stage: Secondary | ICD-10-CM | POA: Diagnosis not present

## 2017-08-29 ENCOUNTER — Other Ambulatory Visit (HOSPITAL_COMMUNITY)
Admission: AD | Admit: 2017-08-29 | Discharge: 2017-08-29 | Disposition: A | Discharge status: Home / Self Care | Payer: PPO | Source: Other Acute Inpatient Hospital | Attending: Urology | Admitting: Urology

## 2017-08-29 ENCOUNTER — Ambulatory Visit (INDEPENDENT_AMBULATORY_CARE_PROVIDER_SITE_OTHER): Payer: PPO | Admitting: Urology

## 2017-08-29 DIAGNOSIS — R300 Dysuria: Secondary | ICD-10-CM | POA: Insufficient documentation

## 2017-08-29 DIAGNOSIS — N3281 Overactive bladder: Secondary | ICD-10-CM | POA: Diagnosis not present

## 2017-08-29 DIAGNOSIS — R3912 Poor urinary stream: Secondary | ICD-10-CM

## 2017-08-31 LAB — URINE CULTURE

## 2017-09-12 ENCOUNTER — Other Ambulatory Visit (HOSPITAL_COMMUNITY)
Admission: RE | Admit: 2017-09-12 | Discharge: 2017-09-12 | Disposition: A | Discharge status: Home / Self Care | Payer: PPO | Source: Other Acute Inpatient Hospital | Attending: Urology | Admitting: Urology

## 2017-09-12 DIAGNOSIS — R300 Dysuria: Secondary | ICD-10-CM | POA: Insufficient documentation

## 2017-09-14 LAB — URINE CULTURE

## 2017-09-27 DIAGNOSIS — N183 Chronic kidney disease, stage 3 (moderate): Secondary | ICD-10-CM | POA: Diagnosis not present

## 2017-09-27 DIAGNOSIS — I5022 Chronic systolic (congestive) heart failure: Secondary | ICD-10-CM | POA: Diagnosis not present

## 2017-09-27 DIAGNOSIS — I10 Essential (primary) hypertension: Secondary | ICD-10-CM | POA: Diagnosis not present

## 2017-09-27 DIAGNOSIS — Z79899 Other long term (current) drug therapy: Secondary | ICD-10-CM | POA: Diagnosis not present

## 2017-10-04 DIAGNOSIS — I4891 Unspecified atrial fibrillation: Secondary | ICD-10-CM | POA: Diagnosis not present

## 2017-10-04 DIAGNOSIS — R030 Elevated blood-pressure reading, without diagnosis of hypertension: Secondary | ICD-10-CM | POA: Diagnosis not present

## 2017-10-04 DIAGNOSIS — Z6835 Body mass index (BMI) 35.0-35.9, adult: Secondary | ICD-10-CM | POA: Diagnosis not present

## 2017-10-04 DIAGNOSIS — I509 Heart failure, unspecified: Secondary | ICD-10-CM | POA: Diagnosis not present

## 2017-10-09 DIAGNOSIS — H353114 Nonexudative age-related macular degeneration, right eye, advanced atrophic with subfoveal involvement: Secondary | ICD-10-CM | POA: Diagnosis not present

## 2017-10-09 DIAGNOSIS — H353211 Exudative age-related macular degeneration, right eye, with active choroidal neovascularization: Secondary | ICD-10-CM | POA: Diagnosis not present

## 2017-10-10 ENCOUNTER — Ambulatory Visit (INDEPENDENT_AMBULATORY_CARE_PROVIDER_SITE_OTHER): Payer: PPO | Admitting: Urology

## 2017-10-10 ENCOUNTER — Other Ambulatory Visit (HOSPITAL_COMMUNITY)
Admission: AD | Admit: 2017-10-10 | Discharge: 2017-10-10 | Disposition: A | Discharge status: Home / Self Care | Payer: PPO | Source: Other Acute Inpatient Hospital | Attending: Urology | Admitting: Urology

## 2017-10-10 DIAGNOSIS — N3281 Overactive bladder: Secondary | ICD-10-CM

## 2017-10-10 DIAGNOSIS — R300 Dysuria: Secondary | ICD-10-CM | POA: Diagnosis not present

## 2017-10-10 DIAGNOSIS — R3912 Poor urinary stream: Secondary | ICD-10-CM

## 2017-10-12 LAB — URINE CULTURE: CULTURE: NO GROWTH

## 2017-10-31 ENCOUNTER — Other Ambulatory Visit (HOSPITAL_COMMUNITY)
Admission: RE | Admit: 2017-10-31 | Discharge: 2017-10-31 | Disposition: A | Discharge status: Home / Self Care | Payer: PPO | Source: Ambulatory Visit | Attending: Urology | Admitting: Urology

## 2017-10-31 DIAGNOSIS — R300 Dysuria: Secondary | ICD-10-CM | POA: Diagnosis not present

## 2017-11-02 LAB — URINE CULTURE

## 2017-11-10 IMAGING — NM NM MYOCAR MULTI W/SPECT W/WALL MOTION & EF
2 series · 12 of 12 positions shown · non-contrast
Comparison: none

[Series 1: rest · 8.28mm/px · 6 of 64 frames shown]
[frame 6/64]
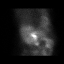
[frame 16/64]
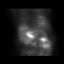
[frame 27/64]
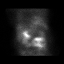
[frame 38/64]
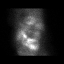
[frame 48/64]
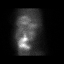
[frame 59/64]
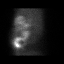

[Series 2: stress gated · 8.28mm/px · 6 of 64 frames shown]
[frame 6/64]
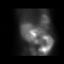
[frame 16/64]
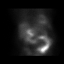
[frame 27/64]
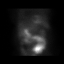
[frame 38/64]
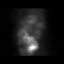
[frame 48/64]
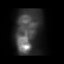
[frame 59/64]
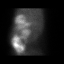

[12 of 12 positions shown; findings below may reference images not displayed]

Canned report from images found in remote index.

Refer to host system for actual result text.

## 2017-11-21 ENCOUNTER — Other Ambulatory Visit (HOSPITAL_COMMUNITY)
Admission: AD | Admit: 2017-11-21 | Discharge: 2017-11-21 | Disposition: A | Discharge status: Home / Self Care | Payer: PPO | Source: Skilled Nursing Facility | Attending: Urology | Admitting: Urology

## 2017-11-21 ENCOUNTER — Ambulatory Visit: Payer: PPO | Admitting: Urology

## 2017-11-21 DIAGNOSIS — N3281 Overactive bladder: Secondary | ICD-10-CM

## 2017-11-21 DIAGNOSIS — R300 Dysuria: Secondary | ICD-10-CM | POA: Insufficient documentation

## 2017-11-23 LAB — URINE CULTURE

## 2017-12-06 DIAGNOSIS — H9213 Otorrhea, bilateral: Secondary | ICD-10-CM | POA: Diagnosis not present

## 2017-12-11 ENCOUNTER — Telehealth: Payer: Self-pay | Admitting: Internal Medicine

## 2017-12-11 DIAGNOSIS — H353211 Exudative age-related macular degeneration, right eye, with active choroidal neovascularization: Secondary | ICD-10-CM | POA: Diagnosis not present

## 2017-12-11 DIAGNOSIS — H353122 Nonexudative age-related macular degeneration, left eye, intermediate dry stage: Secondary | ICD-10-CM | POA: Diagnosis not present

## 2017-12-11 DIAGNOSIS — H353114 Nonexudative age-related macular degeneration, right eye, advanced atrophic with subfoveal involvement: Secondary | ICD-10-CM | POA: Diagnosis not present

## 2017-12-11 DIAGNOSIS — H35721 Serous detachment of retinal pigment epithelium, right eye: Secondary | ICD-10-CM | POA: Diagnosis not present

## 2017-12-11 NOTE — Telephone Encounter (Signed)
Would like to speak w/ Dr. Forde Dandy nurse, please give her a call @ [phone]

## 2017-12-11 NOTE — Telephone Encounter (Signed)
Returned pt call. She complains of worsening SOB. She states that it has been going on for some time, but she has been putting off coming to an appointment. She stated that she needs to see Dr. Lovena Le before August. Appointment made for 6/12 @ 10:15 [address] [facility] location.

## 2017-12-19 ENCOUNTER — Ambulatory Visit (INDEPENDENT_AMBULATORY_CARE_PROVIDER_SITE_OTHER): Payer: PPO | Admitting: Internal Medicine

## 2017-12-19 ENCOUNTER — Encounter (INDEPENDENT_AMBULATORY_CARE_PROVIDER_SITE_OTHER): Payer: Self-pay

## 2017-12-19 ENCOUNTER — Encounter: Payer: Self-pay | Admitting: Internal Medicine

## 2017-12-19 VITALS — BP 122/86 | HR 90 | Ht 61.0 in | Wt 181.0 lb

## 2017-12-19 DIAGNOSIS — R0602 Shortness of breath: Secondary | ICD-10-CM | POA: Diagnosis not present

## 2017-12-19 DIAGNOSIS — I4891 Unspecified atrial fibrillation: Secondary | ICD-10-CM | POA: Diagnosis not present

## 2017-12-19 DIAGNOSIS — I10 Essential (primary) hypertension: Secondary | ICD-10-CM | POA: Diagnosis not present

## 2017-12-19 DIAGNOSIS — I5022 Chronic systolic (congestive) heart failure: Secondary | ICD-10-CM

## 2017-12-19 NOTE — Progress Notes (Addendum)
HPI [name] returns today for followup. She is a very pleasant 84 year old woman with a history of paroxysmal atrial fibrillation which has become persistent/chronic. She was on flecainide for many years but eventually, the flecainide became ineffective.  She was initially placed on amio but did not maintain NSR and we have gone to a strategy of rate control. A repeat echo demonstrated that her EF has gone down to 40%. The patient has had increased dyspnea. She admits to not taking her lasix daily because she is unable to go out after taking the lasix. She has a non-productive cough. Minimal peripheral edema. Previously, she had worn a holter which did not show a RVR in her atrial fib.  Allergies  Allergen Reactions  . Atorvastatin Other (See Comments)    Myalgias  . Hydrocodone Other (See Comments)    GI distress.     Current Outpatient Medications  Medication Sig Dispense Refill  . albuterol (PROVENTIL HFA;VENTOLIN HFA) 108 (90 Base) MCG/ACT inhaler Inhale 2 puffs into the lungs every 6 (six) hours as needed for wheezing or shortness of breath. 1 Inhaler 2  . apixaban (ELIQUIS) 5 MG TABS tablet Take 1 tablet (5 mg total) by mouth 2 (two) times daily. 60 tablet 11  . benzonatate (TESSALON) 200 MG capsule Take 200 mg by mouth 3 (three) times daily as needed for cough.    . carvedilol (COREG) 12.5 MG tablet Take 12.5 mg by mouth 2 (two) times daily with a meal.     . dorzolamide-timolol (COSOPT) 22.3-6.8 MG/ML ophthalmic solution Place 1 drop into the left eye 2 (two) times daily.     . furosemide (LASIX) 40 MG tablet Take 1 tablet (40 mg total) by mouth daily. (Patient taking differently: Take 40 mg by mouth daily. Pt cut her dose to 20 mg daily) 90 tablet 3  . Latanoprostene Bunod (VYZULTA) 0.024 % SOLN Place 1 drop into the right eye at bedtime.     Marland Kitchen losartan (COZAAR) 25 MG tablet Take 1 tablet (25 mg total) by mouth 2 (two) times daily. 90 tablet 3  . OVER THE COUNTER  MEDICATION Take 1 capsule by mouth daily. [employer] eye supplement    . pantoprazole (PROTONIX) 40 MG tablet TAKE ONE TABLET BY MOUTH DAILY BEFORE BREAKFAST FOR ACID REFLUX. 30 tablet 11   No current facility-administered medications for this visit.      Past Medical History:  Diagnosis Date  . Anxiety   . Arteriosclerotic cardiovascular disease (ASCVD)     cath Feb '07- no obstructive dz - 20% proximal LAD only; EF 65%; possible coronary artery spasm  . Atrial fibrillation (HCC)    Paroxysmal on event recorder in 2009; regular supraventricular tachycardia at a rate of 150 also identified on that study representing either atrial flutter or PSVT; onset of persistent AF in 03/2011  . Carcinoma of breast ([place]) 2001   Left mastectomy with positive nodes  . Chest pain    Long-standing and atypical  . Chronic anticoagulation 04/12/2011  . GERD (gastroesophageal reflux disease)    hiatal hernia  . Herpes zoster   . Hyperlipidemia    Lipid profile in 11/2008:282, 176, 64, 201  . Hypertension    diastolic dysfunction; normal CMet and TSH in 11/2009; normal CBC in 04/2010  . Malignant neoplasm of breast (female), unspecified site 05/27/2013   right breast lumpectomy with positive node  . Peripheral neuropathy   . Polio 1946   at age 53  .  Rectal bleed   . Renal insufficiency   . Shortness of breath dyspnea     ROS:   All systems reviewed and negative except as noted in the HPI.   Past Surgical History:  Procedure Laterality Date  . BREAST LUMPECTOMY WITH NEEDLE LOCALIZATION AND AXILLARY SENTINEL LYMPH NODE BX Right 05/27/2013   Procedure: BREAST LUMPECTOMY WITH NEEDLE LOCALIZATION AND AXILLARY SENTINEL LYMPH NODE BX;  Surgeon: Pedro Earls, MD;  Location: [facility];  Service: General;  Laterality: Right;  . BREAST SURGERY    . CHOLECYSTECTOMY  1999  . COLONOSCOPY  02/22/2011   Procedure: COLONOSCOPY;  Surgeon: Rogene Houston, MD;  [facility];   Service: Endoscopy;  Laterality: N/A;; performed for scant hematochezia  . DILATION AND CURETTAGE OF UTERUS    . ESOPHAGOGASTRODUODENOSCOPY  02/22/2011   Procedure: ESOPHAGOGASTRODUODENOSCOPY (EGD);  Surgeon: Rogene Houston, MD;  [facility];  Service: Endoscopy;  Laterality: N/A;  . ESOPHAGOGASTRODUODENOSCOPY N/A 06/02/2014   Procedure: ESOPHAGOGASTRODUODENOSCOPY (EGD);  Surgeon: Rogene Houston, MD;  [facility];  Service: Endoscopy;  Laterality: N/A;  730  . EYE SURGERY     both cataracts  . MALONEY DILATION N/A 06/02/2014   Procedure: Venia Minks DILATION;  Surgeon: Rogene Houston, MD;  [facility];  Service: Endoscopy;  Laterality: N/A;  . MASTECTOMY  2001   Left;modified radical with lymph node dissection  . PORT-A-CATH REMOVAL     pac in and out 2002     Family History  Problem Relation Age of Onset  . Cancer Sister        lung  . Cancer Brother        lipoma  . Cancer Brother        kidney     Social History   Socioeconomic History  . Marital status: Widowed    Spouse name: Not on file  . Number of children: 2  . Years of education: Not on file  . Highest education level: Not on file  Occupational History  . Occupation: homemaker  Social Needs  . Financial resource strain: Not on file  . Food insecurity:    Worry: Not on file    Inability: Not on file  . Transportation needs:    Medical: Not on file    Non-medical: Not on file  Tobacco Use  . Smoking status: Never Smoker  . Smokeless tobacco: Never Used  Substance and Sexual Activity  . Alcohol use: No    Alcohol/week: 0.0 oz  . Drug use: No  . Sexual activity: Not Currently    Birth control/protection: Post-menopausal  Lifestyle  . Physical activity:    Days per week: Not on file    Minutes per session: Not on file  . Stress: Not on file  Relationships  . Social connections:    Talks on phone: Not on file    Gets together: Not on file    Attends religious  service: Not on file    Active member of club or organization: Not on file    Attends meetings of clubs or organizations: Not on file    Relationship status: Not on file  . Intimate partner violence:    Fear of current or ex partner: Not on file    Emotionally abused: Not on file    Physically abused: Not on file    Forced sexual activity: Not on file  Other Topics Concern  . Not on file  Social History Narrative  . Not on file     BP 122/86   Pulse 90   Ht 5\' 1"  (1.549 m)   Wt 181 lb (82.1 kg)   BMI 34.20 kg/m   Physical Exam:  Well appearing 84 yo woman, NAD HEENT: Unremarkable Neck:  6 cm JVD, no thyromegally Lymphatics:  No adenopathy Back:  No CVA tenderness Lungs:  Clear with minimal basilar rales. HEART:  Regular rate rhythm, no murmurs, no rubs, no clicks Abd:  soft, positive bowel sounds, no organomegally, no rebound, no guarding Ext:  2 plus pulses, no edema, no cyanosis, no clubbing Skin:  No rashes no nodules Neuro:  CN II through XII intact, motor grossly intact  EKG - atrial fib with a  Controlled VR.   Assess/Plan: 1. Dyspnea - I suspect her symptoms are multifactorial but she admits to not liking to take her lasix because it makes her urinate all morning. I encouraged her to get her errands done in the morning and then take it in early afternoon. I would like for her to undergo PFT's with a DLCO.  2. Atrial fib - her rate was controlled in Nov 2017. I doubt repeating her holter will add much. It appears that her rate is adequately controlled. She is not a candidate for rhythm control having failed amiodarone. 3. Systolic heart failure - she will continue current beta blocker and after load reduction. She is strongly encouraged to take her lasix and maintain a low sodium diet. 4. Coags - she has not had any bleeding on Eliquis. She is encouraged to continue this medication.  Mikle Bosworth.D

## 2017-12-19 NOTE — Patient Instructions (Addendum)
Medication Instructions:  Your physician has recommended you make the following change in your medication:  1.  Take your lasix every day.  Labwork: None ordered.  Testing/Procedures: Your physician has recommended that you have a pulmonary function test. Pulmonary Function Tests are a group of tests that measure how well air moves in and out of your lungs.  Please schedule for PFT's at [facility] has recommended that you wear a holter monitor. Holter monitors are medical devices that record the heart's electrical activity. Doctors most often use these monitors to diagnose arrhythmias. Arrhythmias are problems with the speed or rhythm of the heartbeat. The monitor is a small, portable device. You can wear one while you do your normal daily activities. This is usually used to diagnose what is causing palpitations/syncope (passing out).  Please schedule for 24 hour holter monitor at [facility] office  Follow-Up: Your physician wants you to follow-up in: July 2019 with Dr. Lovena Le in [place].  Any Other Special Instructions Will Be Listed Below (If Applicable).  If you need a refill on your cardiac medications before your next appointment, please call your pharmacy.

## 2017-12-20 ENCOUNTER — Ambulatory Visit (HOSPITAL_COMMUNITY)
Admission: RE | Admit: 2017-12-20 | Discharge: 2017-12-20 | Disposition: A | Discharge status: Home / Self Care | Payer: PPO | Source: Ambulatory Visit | Attending: Internal Medicine | Admitting: Internal Medicine

## 2017-12-20 ENCOUNTER — Encounter (HOSPITAL_COMMUNITY): Payer: PPO

## 2017-12-20 DIAGNOSIS — R0602 Shortness of breath: Secondary | ICD-10-CM | POA: Insufficient documentation

## 2017-12-20 LAB — PULMONARY FUNCTION TEST
DL/VA % pred: 109 %
DL/VA: 4.83 ml/min/mmHg/L
DLCO UNC: 14.55 ml/min/mmHg
DLCO unc % pred: 71 %
FEF 25-75 PRE: 1.94 L/s
FEF2575-%Pred-Pre: 186 %
FEV1-%Pred-Pre: 95 %
FEV1-PRE: 1.46 L
FEV1FVC-%Pred-Pre: 120 %
FEV6-%PRED-PRE: 85 %
FEV6-PRE: 1.65 L
FEV6FVC-%PRED-PRE: 107 %
FVC-%PRED-PRE: 79 %
FVC-PRE: 1.65 L
PRE FEV1/FVC RATIO: 88 %
Pre FEV6/FVC Ratio: 100 %

## 2017-12-24 ENCOUNTER — Telehealth: Payer: Self-pay | Admitting: *Deleted

## 2017-12-24 DIAGNOSIS — Z79899 Other long term (current) drug therapy: Secondary | ICD-10-CM | POA: Diagnosis not present

## 2017-12-24 DIAGNOSIS — I5023 Acute on chronic systolic (congestive) heart failure: Secondary | ICD-10-CM

## 2017-12-24 DIAGNOSIS — I5022 Chronic systolic (congestive) heart failure: Secondary | ICD-10-CM | POA: Diagnosis not present

## 2017-12-24 DIAGNOSIS — I482 Chronic atrial fibrillation: Secondary | ICD-10-CM | POA: Diagnosis not present

## 2017-12-24 DIAGNOSIS — I2510 Atherosclerotic heart disease of native coronary artery without angina pectoris: Secondary | ICD-10-CM

## 2017-12-24 NOTE — Telephone Encounter (Signed)
-----   Message from Evans Lance, MD sent at 12/22/2017 10:01 PM EDT ----- Let her know that her lung function is fair and likely part but not the only reason for her sob. Please repeat her 2D echo and followup with me in 6-8 weeks.

## 2017-12-24 NOTE — Telephone Encounter (Signed)
Patient informed and verbalized understanding of plan. 

## 2017-12-26 ENCOUNTER — Other Ambulatory Visit: Payer: Self-pay | Admitting: Internal Medicine

## 2017-12-28 ENCOUNTER — Ambulatory Visit (HOSPITAL_COMMUNITY)
Admission: RE | Admit: 2017-12-28 | Discharge: 2017-12-28 | Disposition: A | Discharge status: Home / Self Care | Payer: PPO | Source: Ambulatory Visit | Attending: Internal Medicine | Admitting: Internal Medicine

## 2017-12-28 DIAGNOSIS — Z853 Personal history of malignant neoplasm of breast: Secondary | ICD-10-CM | POA: Insufficient documentation

## 2017-12-28 DIAGNOSIS — E785 Hyperlipidemia, unspecified: Secondary | ICD-10-CM | POA: Diagnosis not present

## 2017-12-28 DIAGNOSIS — I5023 Acute on chronic systolic (congestive) heart failure: Secondary | ICD-10-CM | POA: Diagnosis not present

## 2017-12-28 DIAGNOSIS — I2510 Atherosclerotic heart disease of native coronary artery without angina pectoris: Secondary | ICD-10-CM | POA: Diagnosis not present

## 2017-12-28 DIAGNOSIS — I083 Combined rheumatic disorders of mitral, aortic and tricuspid valves: Secondary | ICD-10-CM | POA: Insufficient documentation

## 2017-12-28 DIAGNOSIS — I110 Hypertensive heart disease with heart failure: Secondary | ICD-10-CM | POA: Diagnosis not present

## 2017-12-28 DIAGNOSIS — R0602 Shortness of breath: Secondary | ICD-10-CM | POA: Diagnosis present

## 2017-12-28 DIAGNOSIS — Z7901 Long term (current) use of anticoagulants: Secondary | ICD-10-CM | POA: Insufficient documentation

## 2017-12-28 DIAGNOSIS — I5032 Chronic diastolic (congestive) heart failure: Secondary | ICD-10-CM | POA: Diagnosis not present

## 2017-12-28 DIAGNOSIS — I4891 Unspecified atrial fibrillation: Secondary | ICD-10-CM | POA: Diagnosis not present

## 2017-12-28 DIAGNOSIS — Z9011 Acquired absence of right breast and nipple: Secondary | ICD-10-CM | POA: Insufficient documentation

## 2017-12-28 NOTE — Progress Notes (Signed)
*  PRELIMINARY RESULTS* Echocardiogram 2D Echocardiogram has been performed.  [name] 12/28/2017, 1:50 PM

## 2017-12-31 ENCOUNTER — Telehealth: Payer: Self-pay

## 2017-12-31 DIAGNOSIS — I4891 Unspecified atrial fibrillation: Secondary | ICD-10-CM | POA: Diagnosis not present

## 2017-12-31 DIAGNOSIS — F419 Anxiety disorder, unspecified: Secondary | ICD-10-CM | POA: Diagnosis not present

## 2017-12-31 DIAGNOSIS — I5022 Chronic systolic (congestive) heart failure: Secondary | ICD-10-CM | POA: Diagnosis not present

## 2017-12-31 NOTE — Telephone Encounter (Signed)
Patient  Taking lasix daily now

## 2017-12-31 NOTE — Telephone Encounter (Signed)
-----   Message from Damian Leavell, RN sent at 12/31/2017 10:17 AM EDT ----- Can you call with results please?

## 2018-01-02 ENCOUNTER — Ambulatory Visit: Payer: PPO | Admitting: Urology

## 2018-01-02 ENCOUNTER — Ambulatory Visit: Payer: PPO | Admitting: Student

## 2018-01-02 DIAGNOSIS — N3281 Overactive bladder: Secondary | ICD-10-CM | POA: Diagnosis not present

## 2018-01-02 DIAGNOSIS — R300 Dysuria: Secondary | ICD-10-CM

## 2018-01-17 ENCOUNTER — Ambulatory Visit (INDEPENDENT_AMBULATORY_CARE_PROVIDER_SITE_OTHER): Payer: PPO | Admitting: Otolaryngology

## 2018-01-17 DIAGNOSIS — H6983 Other specified disorders of Eustachian tube, bilateral: Secondary | ICD-10-CM | POA: Diagnosis not present

## 2018-01-17 DIAGNOSIS — H903 Sensorineural hearing loss, bilateral: Secondary | ICD-10-CM

## 2018-01-28 DIAGNOSIS — H401112 Primary open-angle glaucoma, right eye, moderate stage: Secondary | ICD-10-CM | POA: Diagnosis not present

## 2018-01-28 DIAGNOSIS — H353211 Exudative age-related macular degeneration, right eye, with active choroidal neovascularization: Secondary | ICD-10-CM | POA: Diagnosis not present

## 2018-01-29 ENCOUNTER — Ambulatory Visit: Payer: PPO | Admitting: Internal Medicine

## 2018-01-29 ENCOUNTER — Encounter: Payer: Self-pay | Admitting: Internal Medicine

## 2018-01-29 VITALS — BP 106/72 | HR 88 | Ht 60.0 in | Wt 172.0 lb

## 2018-01-29 DIAGNOSIS — H35721 Serous detachment of retinal pigment epithelium, right eye: Secondary | ICD-10-CM | POA: Diagnosis not present

## 2018-01-29 DIAGNOSIS — H353113 Nonexudative age-related macular degeneration, right eye, advanced atrophic without subfoveal involvement: Secondary | ICD-10-CM | POA: Diagnosis not present

## 2018-01-29 DIAGNOSIS — I5032 Chronic diastolic (congestive) heart failure: Secondary | ICD-10-CM

## 2018-01-29 DIAGNOSIS — H353211 Exudative age-related macular degeneration, right eye, with active choroidal neovascularization: Secondary | ICD-10-CM | POA: Diagnosis not present

## 2018-01-29 DIAGNOSIS — H353114 Nonexudative age-related macular degeneration, right eye, advanced atrophic with subfoveal involvement: Secondary | ICD-10-CM | POA: Diagnosis not present

## 2018-01-29 DIAGNOSIS — H353122 Nonexudative age-related macular degeneration, left eye, intermediate dry stage: Secondary | ICD-10-CM | POA: Diagnosis not present

## 2018-01-29 NOTE — Patient Instructions (Signed)
Medication Instructions:  Your physician recommends that you continue on your current medications as directed. Please refer to the Current Medication list given to you today.  Continue Taking Lasix Decrease Salt intake.  Labwork: NONE   Testing/Procedures: NONE   Follow-Up: Your physician wants you to follow-up in: 1 Year.  You will receive a reminder letter in the mail two months in advance. If you don't receive a letter, please call our office to schedule the follow-up appointment.   Any Other Special Instructions Will Be Listed Below (If Applicable).     If you need a refill on your cardiac medications before your next appointment, please call your pharmacy. Thank you for choosing [facility]!

## 2018-01-29 NOTE — Progress Notes (Signed)
HPI [name] returns today for followup of dyspnea. She is a pleasant 84 yo woman who c/o worsening dyspnea. She has chronic atrial fib. She has diastolic heart failure. She denies angina. She had PFT's which were not too bad and a 2D echo demonstrated that her ef was 50%. She has been taking her lasix and she notes that she is much improved. She can now walk without stopping to catch her breath. Allergies  Allergen Reactions  . Atorvastatin Other (See Comments)    Myalgias  . Hydrocodone Other (See Comments)    GI distress.     Current Outpatient Medications  Medication Sig Dispense Refill  . albuterol (PROVENTIL HFA;VENTOLIN HFA) 108 (90 Base) MCG/ACT inhaler Inhale 2 puffs into the lungs every 6 (six) hours as needed for wheezing or shortness of breath. 1 Inhaler 2  . benzonatate (TESSALON) 200 MG capsule Take 200 mg by mouth 3 (three) times daily as needed for cough.    . carvedilol (COREG) 12.5 MG tablet Take 12.5 mg by mouth 2 (two) times daily with a meal.     . dorzolamide-timolol (COSOPT) 22.3-6.8 MG/ML ophthalmic solution Place 1 drop into the left eye 2 (two) times daily.     Marland Kitchen ELIQUIS 5 MG TABS tablet TAKE (1) TABLET BY MOUTH TWICE DAILY FOR BLOOD THINNER. 60 tablet 5  . furosemide (LASIX) 40 MG tablet TAKE ONE TABLET BY MOUTH ONCE DAILY. 90 tablet 3  . Latanoprostene Bunod (VYZULTA) 0.024 % SOLN Place 1 drop into the right eye at bedtime.     Marland Kitchen losartan (COZAAR) 25 MG tablet Take 1 tablet (25 mg total) by mouth 2 (two) times daily. 90 tablet 3  . OVER THE COUNTER MEDICATION Take 1 capsule by mouth daily. [employer] eye supplement    . pantoprazole (PROTONIX) 40 MG tablet TAKE ONE TABLET BY MOUTH DAILY BEFORE BREAKFAST FOR ACID REFLUX. 30 tablet 11   No current facility-administered medications for this visit.      Past Medical History:  Diagnosis Date  . Anxiety   . Arteriosclerotic cardiovascular disease (ASCVD)     cath Feb '07- no obstructive dz - 20%  proximal LAD only; EF 65%; possible coronary artery spasm  . Atrial fibrillation (HCC)    Paroxysmal on event recorder in 2009; regular supraventricular tachycardia at a rate of 150 also identified on that study representing either atrial flutter or PSVT; onset of persistent AF in 03/2011  . Carcinoma of breast ([facility]) 2001   Left mastectomy with positive nodes  . Chest pain    Long-standing and atypical  . Chronic anticoagulation 04/12/2011  . GERD (gastroesophageal reflux disease)    hiatal hernia  . Herpes zoster   . Hyperlipidemia    Lipid profile in 11/2008:282, 176, 64, 201  . Hypertension    diastolic dysfunction; normal CMet and TSH in 11/2009; normal CBC in 04/2010  . Malignant neoplasm of breast (female), unspecified site 05/27/2013   right breast lumpectomy with positive node  . Peripheral neuropathy   . Polio 1946   at age 89  . Rectal bleed   . Renal insufficiency   . Shortness of breath dyspnea     ROS:   All systems reviewed and negative except as noted in the HPI.   Past Surgical History:  Procedure Laterality Date  . BREAST LUMPECTOMY WITH NEEDLE LOCALIZATION AND AXILLARY SENTINEL LYMPH NODE BX Right 05/27/2013   Procedure: BREAST LUMPECTOMY WITH NEEDLE LOCALIZATION AND AXILLARY SENTINEL  LYMPH NODE BX;  Surgeon: Pedro Earls, MD;  Location: [facility];  Service: General;  Laterality: Right;  . BREAST SURGERY    . CHOLECYSTECTOMY  1999  . COLONOSCOPY  02/22/2011   Procedure: COLONOSCOPY;  Surgeon: Rogene Houston, MD;  [facility];  Service: Endoscopy;  Laterality: N/A;; performed for scant hematochezia  . DILATION AND CURETTAGE OF UTERUS    . ESOPHAGOGASTRODUODENOSCOPY  02/22/2011   Procedure: ESOPHAGOGASTRODUODENOSCOPY (EGD);  Surgeon: Rogene Houston, MD;  [facility];  Service: Endoscopy;  Laterality: N/A;  . ESOPHAGOGASTRODUODENOSCOPY N/A 06/02/2014   Procedure: ESOPHAGOGASTRODUODENOSCOPY (EGD);  Surgeon: Rogene Houston, MD;  [facility];  Service: Endoscopy;  Laterality: N/A;  730  . EYE SURGERY     both cataracts  . MALONEY DILATION N/A 06/02/2014   Procedure: Venia Minks DILATION;  Surgeon: Rogene Houston, MD;  [facility];  Service: Endoscopy;  Laterality: N/A;  . MASTECTOMY  2001   Left;modified radical with lymph node dissection  . PORT-A-CATH REMOVAL     pac in and out 2002     Family History  Problem Relation Age of Onset  . Cancer Sister        lung  . Cancer Brother        lipoma  . Cancer Brother        kidney     Social History   Socioeconomic History  . Marital status: Widowed    Spouse name: Not on file  . Number of children: 2  . Years of education: Not on file  . Highest education level: Not on file  Occupational History  . Occupation: homemaker  Social Needs  . Financial resource strain: Not on file  . Food insecurity:    Worry: Not on file    Inability: Not on file  . Transportation needs:    Medical: Not on file    Non-medical: Not on file  Tobacco Use  . Smoking status: Never Smoker  . Smokeless tobacco: Never Used  Substance and Sexual Activity  . Alcohol use: No    Alcohol/week: 0.0 oz  . Drug use: No  . Sexual activity: Not Currently    Birth control/protection: Post-menopausal  Lifestyle  . Physical activity:    Days per week: Not on file    Minutes per session: Not on file  . Stress: Not on file  Relationships  . Social connections:    Talks on phone: Not on file    Gets together: Not on file    Attends religious service: Not on file    Active member of club or organization: Not on file    Attends meetings of clubs or organizations: Not on file    Relationship status: Not on file  . Intimate partner violence:    Fear of current or ex partner: Not on file    Emotionally abused: Not on file    Physically abused: Not on file    Forced sexual activity: Not on file  Other Topics Concern  . Not on file  Social  History Narrative  . Not on file     BP 106/72 (BP Location: Right Arm)   Pulse 88   Ht 5' (1.524 m)   Wt 172 lb (78 kg)   SpO2 97%   BMI 33.59 kg/m   Physical Exam:  Well appearing 84 yo man, NAD HEENT: Unremarkable Neck:  6 cm JVD, no thyromegally Lymphatics:  No adenopathy Back:  No CVA tenderness Lungs:  Clear with no wheezes HEART:  Regular rate rhythm, no murmurs, no rubs, no clicks Abd:  soft, positive bowel sounds, no organomegally, no rebound, no guarding Ext:  2 plus pulses, no edema, no cyanosis, no clubbing Skin:  No rashes no nodules Neuro:  CN II through XII intact, motor grossly intact  EKG - none   Assess/Plan: 1. Dyspnea - she is much improved with lasix. She will continue and is instructed to maintain a low sodium diet. 2. Chronic diastolic heart failure - her EF is 50%. She will continue her current meds. I strongly encouraged her to avoid salty foods. 3. Atrial fib - her ventricular rate is well controlled. She will continue her current meds. 4. HTN her blood pressure is well controlled. NO change in meds. See above regarding a low sodium diet.  Mikle Bosworth.D.

## 2018-02-18 ENCOUNTER — Ambulatory Visit: Payer: PPO | Admitting: Internal Medicine

## 2018-02-28 ENCOUNTER — Ambulatory Visit (INDEPENDENT_AMBULATORY_CARE_PROVIDER_SITE_OTHER): Payer: PPO | Admitting: Otolaryngology

## 2018-02-28 DIAGNOSIS — H6983 Other specified disorders of Eustachian tube, bilateral: Secondary | ICD-10-CM | POA: Diagnosis not present

## 2018-02-28 DIAGNOSIS — H903 Sensorineural hearing loss, bilateral: Secondary | ICD-10-CM | POA: Diagnosis not present

## 2018-03-06 DIAGNOSIS — H401112 Primary open-angle glaucoma, right eye, moderate stage: Secondary | ICD-10-CM | POA: Diagnosis not present

## 2018-03-18 DIAGNOSIS — F419 Anxiety disorder, unspecified: Secondary | ICD-10-CM | POA: Diagnosis not present

## 2018-03-18 DIAGNOSIS — I482 Chronic atrial fibrillation: Secondary | ICD-10-CM | POA: Diagnosis not present

## 2018-03-18 DIAGNOSIS — I5022 Chronic systolic (congestive) heart failure: Secondary | ICD-10-CM | POA: Diagnosis not present

## 2018-03-18 DIAGNOSIS — Z79899 Other long term (current) drug therapy: Secondary | ICD-10-CM | POA: Diagnosis not present

## 2018-03-25 DIAGNOSIS — I5022 Chronic systolic (congestive) heart failure: Secondary | ICD-10-CM | POA: Diagnosis not present

## 2018-03-25 DIAGNOSIS — I482 Chronic atrial fibrillation: Secondary | ICD-10-CM | POA: Diagnosis not present

## 2018-04-01 ENCOUNTER — Other Ambulatory Visit (INDEPENDENT_AMBULATORY_CARE_PROVIDER_SITE_OTHER): Payer: Self-pay | Admitting: Internal Medicine

## 2018-04-02 DIAGNOSIS — H35721 Serous detachment of retinal pigment epithelium, right eye: Secondary | ICD-10-CM | POA: Diagnosis not present

## 2018-04-02 DIAGNOSIS — H353211 Exudative age-related macular degeneration, right eye, with active choroidal neovascularization: Secondary | ICD-10-CM | POA: Diagnosis not present

## 2018-04-02 DIAGNOSIS — H353122 Nonexudative age-related macular degeneration, left eye, intermediate dry stage: Secondary | ICD-10-CM | POA: Diagnosis not present

## 2018-04-02 DIAGNOSIS — H353114 Nonexudative age-related macular degeneration, right eye, advanced atrophic with subfoveal involvement: Secondary | ICD-10-CM | POA: Diagnosis not present

## 2018-04-10 ENCOUNTER — Ambulatory Visit (INDEPENDENT_AMBULATORY_CARE_PROVIDER_SITE_OTHER): Payer: PPO | Admitting: Orthopaedic Surgery

## 2018-04-10 ENCOUNTER — Encounter (INDEPENDENT_AMBULATORY_CARE_PROVIDER_SITE_OTHER): Payer: Self-pay | Admitting: Orthopaedic Surgery

## 2018-04-10 VITALS — BP 99/66 | HR 74 | Ht 60.0 in | Wt 170.0 lb

## 2018-04-10 DIAGNOSIS — M25552 Pain in left hip: Secondary | ICD-10-CM | POA: Diagnosis not present

## 2018-04-10 DIAGNOSIS — M170 Bilateral primary osteoarthritis of knee: Secondary | ICD-10-CM

## 2018-04-10 MED ORDER — LIDOCAINE HCL 1 % IJ SOLN
2.0000 mL | INTRAMUSCULAR | Status: AC | PRN
  Administered 2018-04-10: 2 mL

## 2018-04-10 MED ORDER — METHYLPREDNISOLONE ACETATE 40 MG/ML IJ SUSP
40.0000 mg | INTRAMUSCULAR | Status: AC | PRN
  Administered 2018-04-10: 40 mg via INTRA_ARTICULAR

## 2018-04-10 MED ORDER — BUPIVACAINE HCL 0.5 % IJ SOLN
2.0000 mL | INTRAMUSCULAR | Status: AC | PRN
  Administered 2018-04-10: 2 mL via INTRA_ARTICULAR

## 2018-04-10 NOTE — Progress Notes (Signed)
Office Visit Note   Patient: [name]           Date of Birth: [birth year]           MRN: [id number] Visit Date: 04/10/2018              Requested by: Asencion Noble, MD [address] [place], [place] [address] PCP: Asencion Noble, MD   Assessment & Plan: Visit Diagnoses:  1. Bilateral primary osteoarthritis of knee   2. Pain of left hip joint     Plan: Cortisone injection right knee and left greater trochanter.  We will see in 2 weeks to consider injecting the left knee  Follow-Up Instructions: Return in about 2 weeks (around 04/24/2018).   Orders:  Orders Placed This Encounter  Procedures  . Large Joint Inj: R knee  . Large Joint Inj: L greater trochanter   No orders of the defined types were placed in this encounter.     Procedures: Large Joint Inj: R knee on 04/10/2018 1:41 PM Indications: pain and diagnostic evaluation Details: 25 G 1.5 in needle, anteromedial approach  Arthrogram: No  Medications: 2 mL bupivacaine 0.5 %; 2 mL lidocaine 1 %; 40 mg methylPREDNISolone acetate 40 MG/ML Procedure, treatment alternatives, risks and benefits explained, specific risks discussed. Consent was given by the patient. Immediately prior to procedure a time out was called to verify the correct patient, procedure, equipment, support staff and site/side marked as required. Patient was prepped and draped in the usual sterile fashion.   Large Joint Inj: L greater trochanter on 04/10/2018 1:42 PM Indications: pain and diagnostic evaluation Details: 25 G 1.5 in needle, lateral approach  Arthrogram: No  Medications: 2 mL lidocaine 1 %; 2 mL bupivacaine 0.5 %; 40 mg methylPREDNISolone acetate 40 MG/ML Procedure, treatment alternatives, risks and benefits explained, specific risks discussed. Consent was given by the patient. Immediately prior to procedure a time out was called to verify the correct patient, procedure, equipment, support staff and site/side marked as required. Patient  was prepped and draped in the usual sterile fashion.       Clinical Data: No additional findings.   Subjective: Chief Complaint  Patient presents with  . Follow-up    L HIP PAIN FOR ABOUT 2 YRS GETING WORSE, NO INJURY. BI LAT KNEE PAIN FOR OVER 10 YRS USED KNEE BRACES, TYLENOL AND INJECTIONS LAST INJECTIONS WERE DONE IN BOTH KNEES ABOUT 2 YRS AGO  [name] is 84 years old and was seen up over a year ago for evaluation of bilateral knee pain and left greater trochanteric pain.  X-rays revealed osteoarthritis of her knees.  At the time I injected her right knee and left greater trochanter with good relief of her pain.  She is having some recurrent discomfort and wanted another cortisone injection.  No recent fever or chills.  Has many medical comorbidities  HPI  Review of Systems  Constitutional: Positive for fatigue. Negative for fever.  HENT: Negative for ear pain.   Eyes: Negative for pain.  Respiratory: Negative for cough and shortness of breath.   Cardiovascular: Negative for leg swelling.  Gastrointestinal: Negative for constipation and diarrhea.  Genitourinary: Negative for difficulty urinating.  Musculoskeletal: Positive for back pain. Negative for neck pain.  Skin: Negative for rash.  Neurological: Positive for weakness. Negative for numbness.  Hematological: Does not bruise/bleed easily.  Psychiatric/Behavioral: Positive for sleep disturbance.     Objective: Vital Signs: BP 99/66 (BP Location: Right Arm, Patient Position: Sitting,  Cuff Size: Normal)   Pulse 74   Ht 5' (1.524 m)   Wt 170 lb (77.1 kg)   BMI 33.20 kg/m   Physical Exam  Constitutional: She is oriented to person, place, and time. She appears well-developed and well-nourished.  HENT:  Mouth/Throat: Oropharynx is clear and moist.  Eyes: Pupils are equal, round, and reactive to light. EOM are normal.  Pulmonary/Chest: Effort normal.  Neurological: She is alert and oriented to person, place, and  time.  Skin: Skin is warm and dry.  Psychiatric: She has a normal mood and affect. Her behavior is normal.    Ortho Exam awake alert and oriented x3.  Comfortable sitting.  Venous stasis changes of both lower extremities distally with some varicosities.  Some pain along the medial compartment of her right knee.  Minimal limp.  Full extension flexed about 100 degrees without instability right knee.  Pain directly over the greater trochanter of the left hip.  No pain with range of motion of her hip Specialty Comments:  No specialty comments available.  Imaging: No results found.   PMFS History: Patient Active Problem List   Diagnosis Date Noted  . Bilateral primary osteoarthritis of knee 04/10/2018  . Pain of left hip joint 04/10/2018  . Acute on chronic systolic CHF (congestive heart failure) ([facility]) 07/05/2017  . Influenza-like illness 07/05/2017  . Prolonged QT interval 07/05/2017  . Malignant neoplasm of upper-inner quadrant of left breast in female, estrogen receptor positive ([facility]) 06/05/2017  . Lower extremity edema 05/03/2016  . Early satiety 05/07/2014  . Dysphagia, pharyngoesophageal phase 05/07/2014  . Cough 03/09/2014  . Malignant neoplasm of upper-inner quadrant of right breast in female, estrogen receptor positive ([place]) 06/25/2013  . Bilateral breast cancer ([facility]) 05/09/2013  . Wrist fracture, closed 12/10/2012  . Triquetral fracture 10/29/2012  . Sprain of right wrist 10/29/2012  . Chronic anticoagulation 04/12/2011  . Atrial fibrillation ([facility]) 03/22/2011  . Arteriosclerotic cardiovascular disease (ASCVD)   . Hyperlipidemia   . Polio   . Hypertension   . GERD (gastroesophageal reflux disease)   . HERPES ZOSTER 11/12/2008  . ANXIETY 07/24/2007  . PERIPHERAL NEUROPATHY 07/24/2007   Past Medical History:  Diagnosis Date  . Anxiety   . Arteriosclerotic cardiovascular disease (ASCVD)     cath Feb '07- no obstructive dz - 20% proximal LAD only; EF 65%; possible  coronary artery spasm  . Atrial fibrillation (HCC)    Paroxysmal on event recorder in 2009; regular supraventricular tachycardia at a rate of 150 also identified on that study representing either atrial flutter or PSVT; onset of persistent AF in 03/2011  . Carcinoma of breast ([facility]) 2001   Left mastectomy with positive nodes  . Chest pain    Long-standing and atypical  . Chronic anticoagulation 04/12/2011  . GERD (gastroesophageal reflux disease)    hiatal hernia  . Herpes zoster   . Hyperlipidemia    Lipid profile in 11/2008:282, 176, 64, 201  . Hypertension    diastolic dysfunction; normal CMet and TSH in 11/2009; normal CBC in 04/2010  . Malignant neoplasm of breast (female), unspecified site 05/27/2013   right breast lumpectomy with positive node  . Peripheral neuropathy   . Polio 1946   at age 62  . Rectal bleed   . Renal insufficiency   . Shortness of breath dyspnea     Family History  Problem Relation Age of Onset  . Cancer Sister        lung  . Cancer Brother  lipoma  . Cancer Brother        kidney    Past Surgical History:  Procedure Laterality Date  . BREAST LUMPECTOMY WITH NEEDLE LOCALIZATION AND AXILLARY SENTINEL LYMPH NODE BX Right 05/27/2013   Procedure: BREAST LUMPECTOMY WITH NEEDLE LOCALIZATION AND AXILLARY SENTINEL LYMPH NODE BX;  Surgeon: Pedro Earls, MD;  Location: [facility];  Service: General;  Laterality: Right;  . BREAST SURGERY    . CHOLECYSTECTOMY  1999  . COLONOSCOPY  02/22/2011   Procedure: COLONOSCOPY;  Surgeon: Rogene Houston, MD;  [facility];  Service: Endoscopy;  Laterality: N/A;; performed for scant hematochezia  . DILATION AND CURETTAGE OF UTERUS    . ESOPHAGOGASTRODUODENOSCOPY  02/22/2011   Procedure: ESOPHAGOGASTRODUODENOSCOPY (EGD);  Surgeon: Rogene Houston, MD;  [facility];  Service: Endoscopy;  Laterality: N/A;  . ESOPHAGOGASTRODUODENOSCOPY N/A 06/02/2014   Procedure:  ESOPHAGOGASTRODUODENOSCOPY (EGD);  Surgeon: Rogene Houston, MD;  [facility];  Service: Endoscopy;  Laterality: N/A;  730  . EYE SURGERY     both cataracts  . MALONEY DILATION N/A 06/02/2014   Procedure: Venia Minks DILATION;  Surgeon: Rogene Houston, MD;  [facility];  Service: Endoscopy;  Laterality: N/A;  . MASTECTOMY  2001   Left;modified radical with lymph node dissection  . PORT-A-CATH REMOVAL     pac in and out 2002   Social History   Occupational History  . Occupation: homemaker  Tobacco Use  . Smoking status: Never Smoker  . Smokeless tobacco: Never Used  Substance and Sexual Activity  . Alcohol use: No    Alcohol/week: 0.0 standard drinks  . Drug use: No  . Sexual activity: Not Currently    Birth control/protection: Post-menopausal

## 2018-04-18 ENCOUNTER — Other Ambulatory Visit: Payer: Self-pay | Admitting: *Deleted

## 2018-04-23 ENCOUNTER — Other Ambulatory Visit: Payer: Self-pay | Admitting: *Deleted

## 2018-04-23 DIAGNOSIS — Z170 Estrogen receptor positive status [ER+]: Secondary | ICD-10-CM

## 2018-04-23 DIAGNOSIS — C50211 Malignant neoplasm of upper-inner quadrant of right female breast: Secondary | ICD-10-CM

## 2018-05-07 ENCOUNTER — Telehealth: Payer: Self-pay | Admitting: Internal Medicine

## 2018-05-07 NOTE — Telephone Encounter (Signed)
Pt lvm wanting to know if there has been any Eliquis sent here for her. [phone]

## 2018-05-08 NOTE — Telephone Encounter (Signed)
No eliquis has arrived today, pt has 1 week supply left

## 2018-05-14 ENCOUNTER — Other Ambulatory Visit: Payer: PPO

## 2018-05-14 ENCOUNTER — Ambulatory Visit: Payer: PPO | Admitting: Oncology

## 2018-05-21 ENCOUNTER — Ambulatory Visit (HOSPITAL_COMMUNITY)
Admission: RE | Admit: 2018-05-21 | Discharge: 2018-05-21 | Disposition: A | Discharge status: Home / Self Care | Payer: PPO | Source: Ambulatory Visit | Attending: Oncology | Admitting: Oncology

## 2018-05-21 ENCOUNTER — Ambulatory Visit (HOSPITAL_COMMUNITY): Payer: PPO

## 2018-05-21 DIAGNOSIS — Z170 Estrogen receptor positive status [ER+]: Secondary | ICD-10-CM | POA: Diagnosis not present

## 2018-05-21 DIAGNOSIS — C50211 Malignant neoplasm of upper-inner quadrant of right female breast: Secondary | ICD-10-CM | POA: Insufficient documentation

## 2018-05-21 DIAGNOSIS — R921 Mammographic calcification found on diagnostic imaging of breast: Secondary | ICD-10-CM | POA: Diagnosis not present

## 2018-05-21 DIAGNOSIS — Z23 Encounter for immunization: Secondary | ICD-10-CM | POA: Diagnosis not present

## 2018-05-29 DIAGNOSIS — L570 Actinic keratosis: Secondary | ICD-10-CM | POA: Diagnosis not present

## 2018-05-29 DIAGNOSIS — L821 Other seborrheic keratosis: Secondary | ICD-10-CM | POA: Diagnosis not present

## 2018-05-29 DIAGNOSIS — X32XXXD Exposure to sunlight, subsequent encounter: Secondary | ICD-10-CM | POA: Diagnosis not present

## 2018-05-29 DIAGNOSIS — L820 Inflamed seborrheic keratosis: Secondary | ICD-10-CM | POA: Diagnosis not present

## 2018-06-24 NOTE — Progress Notes (Signed)
[facility]  [phone] Fax:([phone]) [phone]     ID: [name] OB: 1932/01/26  MR#: [id number]  KGY#:[id number]  Patient Care Team: Asencion Noble, MD as PCP - General (Internal Medicine) Evans Lance, MD (Cardiology) Rothbart, Cristopher Estimable, MD (Cardiology) Biagio Borg, MD (Internal Medicine) Annia Belt, MD (Hematology and Oncology) Asencion Noble, MD as Consulting Physician (Internal Medicine) Tyler Pita, MD as Consulting Physician (Radiation Oncology) SU: Johnathan Hausen OTHER MD: Hildred Laser  CHIEF COMPLAINT: Estrogen receptor positive breast cancer  CURRENT TREATMENT: Observation  BREAST CANCER HISTORY: From doctor Grandf[place]a's 06/26/2013 note:  "Pleasant 84 year old woman who I graduated from our practice over 3 years ago in 2011. She was a 10 year survivor of an initial stage II, 6 node positive, ER PR positive, HER-2 positive, cancer of the left breast diagnosed in October 2001 treated by mastectomy followed by 6 cycles of Cytoxan Adriamycin Taxotere chemotherapy then 5 years of Arimidex hormonal therapy. Arimidex completed in June 2011.    A recent routine mammogram done  on 03/25/2013 showed clustered calcifications which were suspicious for malignancy. She underwent a diagnostic mammogram on October 3 which confirmed these findings then she underwent a needle biopsy on October 14. MRI done on October 29 showed a 2 x 1.8 x 1.3 cm lesion at the 12:00 position. She underwent a lumpectomy and sentinel lymph node dissection on 05/27/2013 by Dr. Johnathan Hausen. Final measurements of the tumor were 1.5 cm. Primarily invasive ductal. Positive vascular and lymphatic invasion. Grade 2 of 3. Estrogen and progesterone receptors both 100%. HER-2 negative. Ki-67 55%. One of 2 sentinel lymph nodes grossly positive for invasive cancer.   She has had a number of interim medical problems since she last saw me. She has developed atrial fibrillation. This  was poorly controlled medically. She underwent DC cardioversion 2 years ago but unfortunately has reverted to atrial fibrillation again recently and another cardioversion procedure is planned. She was on verapamil long-acting and was recently started on amiodarone. Of note she was also started on one of the new oral anticoagulants, Pradaxa, in full doses of 150 mg twice daily."  Her subsequent history is as detailed below  INTERVAL HISTORY: [name] returns today for follow-up of her estrogen receptor positive breast cancer.   The patient discontinued exemestane approximately a year ago.  She was supposed to have called [place] to tell us if she was feeling any better off the drug but she was and she did not call and stayed off the medication..   Since her last visit here, she underwent a digital diagnostic unilateral right mammogram with tomography on 05/21/2018 showing Breast Density Category B. There is no mammographic evidence to suggest malignancy.    REVIEW OF SYSTEMS: [name] is doing well overall. She has been diagnosed with Congestive Heart Failure, which is being treated by Dr. Cristopher Peru. She is taking a diuretic, but it makes her "droopy" all day. She is bruising occasionally, but "it's not bad." She has macular degeneration and glaucoma. For Thanksgiving, she went to her son's house in [place]. The patient denies unusual headaches, nausea, vomiting, or dizziness. There has been no unusual cough, phlegm production, or pleurisy. This been no change in bowel habits. The patient denies unexplained fatigue or unexplained weight loss, bleeding, rash, or fever. A detailed review of systems was otherwise noncontributory.    PAST MEDICAL HISTORY: Past Medical History:  Diagnosis Date  . Anxiety   . Arteriosclerotic cardiovascular disease (ASCVD)  cath Feb '07- no obstructive dz - 20% proximal LAD only; EF 65%; possible coronary artery spasm  . Atrial fibrillation (HCC)    Paroxysmal on event  recorder in 2009; regular supraventricular tachycardia at a rate of 150 also identified on that study representing either atrial flutter or PSVT; onset of persistent AF in 03/2011  . Carcinoma of breast ([place]) 2001   Left mastectomy with positive nodes  . Chest pain    Long-standing and atypical  . Chronic anticoagulation 04/12/2011  . GERD (gastroesophageal reflux disease)    hiatal hernia  . Herpes zoster   . Hyperlipidemia    Lipid profile in 11/2008:282, 176, 64, 201  . Hypertension    diastolic dysfunction; normal CMet and TSH in 11/2009; normal CBC in 04/2010  . Malignant neoplasm of breast (female), unspecified site 05/27/2013   right breast lumpectomy with positive node  . Peripheral neuropathy   . Polio 1946   at age 53  . Rectal bleed   . Renal insufficiency   . Shortness of breath dyspnea     PAST SURGICAL HISTORY: Past Surgical History:  Procedure Laterality Date  . BREAST LUMPECTOMY WITH NEEDLE LOCALIZATION AND AXILLARY SENTINEL LYMPH NODE BX Right 05/27/2013   Procedure: BREAST LUMPECTOMY WITH NEEDLE LOCALIZATION AND AXILLARY SENTINEL LYMPH NODE BX;  Surgeon: Pedro Earls, MD;  Location: [facility];  Service: General;  Laterality: Right;  . BREAST SURGERY    . CHOLECYSTECTOMY  1999  . COLONOSCOPY  02/22/2011   Procedure: COLONOSCOPY;  Surgeon: Rogene Houston, MD;  [facility];  Service: Endoscopy;  Laterality: N/A;; performed for scant hematochezia  . DILATION AND CURETTAGE OF UTERUS    . ESOPHAGOGASTRODUODENOSCOPY  02/22/2011   Procedure: ESOPHAGOGASTRODUODENOSCOPY (EGD);  Surgeon: Rogene Houston, MD;  [facility];  Service: Endoscopy;  Laterality: N/A;  . ESOPHAGOGASTRODUODENOSCOPY N/A 06/02/2014   Procedure: ESOPHAGOGASTRODUODENOSCOPY (EGD);  Surgeon: Rogene Houston, MD;  [facility];  Service: Endoscopy;  Laterality: N/A;  730  . EYE SURGERY     both cataracts  . MALONEY DILATION N/A 06/02/2014   Procedure:  Venia Minks DILATION;  Surgeon: Rogene Houston, MD;  [facility];  Service: Endoscopy;  Laterality: N/A;  . MASTECTOMY  2001   Left;modified radical with lymph node dissection  . PORT-A-CATH REMOVAL     pac in and out 2002    FAMILY HISTORY Family History  Problem Relation Age of Onset  . Cancer Sister        lung  . Cancer Brother        lipoma  . Cancer Brother        kidney   The patient's parents died from noncancer related causes. The patient had 2 sisters and 2 brothers. One sister developed lung cancer and a brother non-Hodgkin's lymphoma. A brother has also had a history of melanoma. There is no history of breast or ovarian cancer in the family  GYNECOLOGIC HISTORY:  Menarche age 64, first live birth age 20. The patient is GX P2. She went through the change of life approximately age 57, and took hormone replacement for "many years".  SOCIAL HISTORY:  [name] is a widow and lives by herself.The patient's 2 children are [name], who lives in [place] and is retired from [employer], and [name], who also lives in [place], and is in the [employer].  1 of her grand children is in the [facility].  The  patient has 5 grandchildren and 7 great-grandchildren. She is a Psychologist, forensic.    ADVANCED DIRECTIVES: not scanned   HEALTH MAINTENANCE: Social History   Tobacco Use  . Smoking status: Never Smoker  . Smokeless tobacco: Never Used  Substance Use Topics  . Alcohol use: No    Alcohol/week: 0.0 standard drinks  . Drug use: No    Allergies  Allergen Reactions  . Atorvastatin Other (See Comments)    Myalgias  . Hydrocodone Other (See Comments)    GI distress.    Current Outpatient Medications  Medication Sig Dispense Refill  . albuterol (PROVENTIL HFA;VENTOLIN HFA) 108 (90 Base) MCG/ACT inhaler Inhale 2 puffs into the lungs every 6 (six) hours as needed for wheezing or shortness of breath. 1 Inhaler 2  . benzonatate (TESSALON) 200 MG capsule  Take 200 mg by mouth 3 (three) times daily as needed for cough.    . carvedilol (COREG) 12.5 MG tablet Take 12.5 mg by mouth 2 (two) times daily with a meal.     . dorzolamide-timolol (COSOPT) 22.3-6.8 MG/ML ophthalmic solution Place 1 drop into the left eye 2 (two) times daily.     Marland Kitchen ELIQUIS 5 MG TABS tablet TAKE (1) TABLET BY MOUTH TWICE DAILY FOR BLOOD THINNER. 60 tablet 5  . furosemide (LASIX) 40 MG tablet TAKE ONE TABLET BY MOUTH ONCE DAILY. 90 tablet 3  . Latanoprostene Bunod (VYZULTA) 0.024 % SOLN Place 1 drop into the right eye at bedtime.     Marland Kitchen losartan (COZAAR) 25 MG tablet Take 1 tablet (25 mg total) by mouth 2 (two) times daily. 90 tablet 3  . OVER THE COUNTER MEDICATION Take 1 capsule by mouth daily. [employer] eye supplement    . pantoprazole (PROTONIX) 40 MG tablet TAKE ONE TABLET BY MOUTH DAILY BEFORE BREAKFAST FOR ACID REFLUX. 30 tablet 5   No current facility-administered medications for this visit.     OBJECTIVE: Older white woman who appears stated age 40:   06/25/18 1244  BP: 118/83  Pulse: 86  Resp: 18  Temp: 97.8 F (36.6 C)  SpO2: 95%     Body mass index is 34.65 kg/m.    ECOG FS:1 - Symptomatic but completely ambulatory Filed Weights   06/25/18 1244  Weight: 177 lb 6.4 oz (80.5 kg)   Sclerae unicteric, EOMs intact; the right pupil is still dilated from today's treatment No cervical or supraclavicular adenopathy Lungs no rales or rhonchi Heart regular rate and rhythm Abd soft, nontender, positive bowel sounds MSK no focal spinal tenderness, no upper extremity lymphedema Neuro: nonfocal, well oriented, appropriate affect Breasts: The right breast is status post remote surgery, with no evidence of disease activity.  The left breast is status post mastectomy with no evidence of chest wall recurrence.  Both axillae are benign.  LAB RESULTS:  CMP     Component Value Date/Time   NA 145 06/25/2018 1228   NA 140 06/05/2017 1019   K 4.2 06/25/2018  1228   K 4.6 06/05/2017 1019   CL 107 06/25/2018 1228   CO2 30 06/25/2018 1228   CO2 29 06/05/2017 1019   GLUCOSE 135 (H) 06/25/2018 1228   GLUCOSE 102 06/05/2017 1019   BUN 14 06/25/2018 1228   BUN 16.7 06/05/2017 1019   CREATININE 0.90 06/25/2018 1228   CREATININE 0.9 06/05/2017 1019   CALCIUM 9.9 06/25/2018 1228   CALCIUM 10.0 06/05/2017 1019   PROT 6.5 06/25/2018 1228   PROT 7.1 06/05/2017 1019   ALBUMIN  3.7 06/25/2018 1228   ALBUMIN 3.7 06/05/2017 1019   AST 14 (L) 06/25/2018 1228   AST 17 06/05/2017 1019   ALT 9 06/25/2018 1228   ALT 11 06/05/2017 1019   ALKPHOS 107 06/25/2018 1228   ALKPHOS 111 06/05/2017 1019   BILITOT 1.0 06/25/2018 1228   BILITOT 1.17 06/05/2017 1019   GFRNONAA 59 (L) 06/25/2018 1228   GFRAA >60 06/25/2018 1228    I No results found for: SPEP  Lab Results  Component Value Date   WBC 6.1 06/25/2018   NEUTROABS 3.7 06/25/2018   HGB 13.4 06/25/2018   HCT 43.3 06/25/2018   MCV 95.8 06/25/2018   PLT 218 06/25/2018      Chemistry      Component Value Date/Time   NA 145 06/25/2018 1228   NA 140 06/05/2017 1019   K 4.2 06/25/2018 1228   K 4.6 06/05/2017 1019   CL 107 06/25/2018 1228   CO2 30 06/25/2018 1228   CO2 29 06/05/2017 1019   BUN 14 06/25/2018 1228   BUN 16.7 06/05/2017 1019   CREATININE 0.90 06/25/2018 1228   CREATININE 0.9 06/05/2017 1019      Component Value Date/Time   CALCIUM 9.9 06/25/2018 1228   CALCIUM 10.0 06/05/2017 1019   ALKPHOS 107 06/25/2018 1228   ALKPHOS 111 06/05/2017 1019   AST 14 (L) 06/25/2018 1228   AST 17 06/05/2017 1019   ALT 9 06/25/2018 1228   ALT 11 06/05/2017 1019   BILITOT 1.0 06/25/2018 1228   BILITOT 1.17 06/05/2017 1019       No results found for: LABCA2  No components found for: LABCA125  No results for input(s): INR in the last 168 hours.  Urinalysis    Component Value Date/Time   COLORURINE YELLOW 07/05/2017 2125   APPEARANCEUR CLEAR 07/05/2017 2125   LABSPEC 1.023  07/05/2017 2125   PHURINE 5.0 07/05/2017 2125   GLUCOSEU NEGATIVE 07/05/2017 2125   GLUCOSEU NEGATIVE 11/12/2008 1508   HGBUR NEGATIVE 07/05/2017 2125   BILIRUBINUR NEGATIVE 07/05/2017 2125   KETONESUR 20 (A) 07/05/2017 2125   PROTEINUR NEGATIVE 07/05/2017 2125   UROBILINOGEN 0.2 11/12/2008 1508   NITRITE NEGATIVE 07/05/2017 2125   LEUKOCYTESUR NEGATIVE 07/05/2017 2125    STUDIES: No results found.   ASSESSMENT: 84 y.o. [place] woman  (1) status post left modified radical mastectomy 05/25/2000 for a pT1(mic) pN2, stage IIIA upper inner quadrant invasive ductal carcinoma, grade 3 estrogen receptor 93% positive, progesterone receptor 17% positive, with an MIB-1 of 38% and no HER-2 amplification by FISH  (2) status post adjuvant chemotherapy with docetaxel, doxorubicin and cyclophosphamide x6, followed by adjuvant radiation to the left chest wall, followed by anastrozole which was continued until may of 2011  (3) status post right lumpectomy with sentinel lymph node biopsy 05/27/2013 for a pT1c pN1, stage IIA invasive ductal carcinoma, grade 2, estrogen and progesterone receptor strongly positive, HER-2 not amplified, with an MIB-1 of 55%  (4) completed adjuvant radiation to the right breast, axilla, and right supraclavicular region 09/08/2013  (5) started exemestane 12/09/2013--held November 2018 with concern regarding arthralgias/myalgias  (6) osteopenia, with a T score of -1.2 on bone density study October 2015  PLAN: Armella is now a little over 5 years out from definitive surgery for her breast cancer with no evidence of disease recurrence.  This is very favorable.  She was able to take Aromasin for 4 years.  This should have decreased her chance of recurrence by about half and also  cut about in half her risk of developing another breast cancer.  At this point I feel comfortable releasing her to her primary care physician.  All she will need in terms of breast cancer follow-up  is a yearly mammogram and a yearly physician breast and chest wall exam  I will be glad to see Liviya again at any point in the future if and when the need arises but as of now are making no further routine appointments for her here.   , Virgie Dad, MD  06/25/18 1:24 PM Medical Oncology and Hematology [facility] [address] [place], [place] [address] Tel. [phone]    Fax. [phone]    I, Jacqualyn Posey am acting as a Education administrator for Chauncey Cruel, MD.   I, Lurline Del MD, have reviewed the above documentation for accuracy and completeness, and I agree with the above.

## 2018-06-25 ENCOUNTER — Inpatient Hospital Stay: Payer: PPO | Attending: Oncology

## 2018-06-25 ENCOUNTER — Inpatient Hospital Stay (HOSPITAL_BASED_OUTPATIENT_CLINIC_OR_DEPARTMENT_OTHER): Payer: PPO | Admitting: Oncology

## 2018-06-25 VITALS — BP 118/83 | HR 86 | Temp 97.8°F | Resp 18 | Ht 60.0 in | Wt 177.4 lb

## 2018-06-25 DIAGNOSIS — I10 Essential (primary) hypertension: Secondary | ICD-10-CM | POA: Diagnosis not present

## 2018-06-25 DIAGNOSIS — Z9221 Personal history of antineoplastic chemotherapy: Secondary | ICD-10-CM | POA: Diagnosis not present

## 2018-06-25 DIAGNOSIS — Z7901 Long term (current) use of anticoagulants: Secondary | ICD-10-CM

## 2018-06-25 DIAGNOSIS — Z923 Personal history of irradiation: Secondary | ICD-10-CM | POA: Diagnosis not present

## 2018-06-25 DIAGNOSIS — Z170 Estrogen receptor positive status [ER+]: Secondary | ICD-10-CM | POA: Diagnosis not present

## 2018-06-25 DIAGNOSIS — Z853 Personal history of malignant neoplasm of breast: Secondary | ICD-10-CM

## 2018-06-25 DIAGNOSIS — M8580 Other specified disorders of bone density and structure, unspecified site: Secondary | ICD-10-CM | POA: Diagnosis not present

## 2018-06-25 DIAGNOSIS — C50911 Malignant neoplasm of unspecified site of right female breast: Secondary | ICD-10-CM

## 2018-06-25 DIAGNOSIS — C50211 Malignant neoplasm of upper-inner quadrant of right female breast: Secondary | ICD-10-CM

## 2018-06-25 DIAGNOSIS — Z79899 Other long term (current) drug therapy: Secondary | ICD-10-CM | POA: Diagnosis not present

## 2018-06-25 DIAGNOSIS — C50912 Malignant neoplasm of unspecified site of left female breast: Secondary | ICD-10-CM

## 2018-06-25 DIAGNOSIS — Z9012 Acquired absence of left breast and nipple: Secondary | ICD-10-CM | POA: Diagnosis not present

## 2018-06-25 DIAGNOSIS — H353211 Exudative age-related macular degeneration, right eye, with active choroidal neovascularization: Secondary | ICD-10-CM | POA: Diagnosis not present

## 2018-06-25 DIAGNOSIS — I4891 Unspecified atrial fibrillation: Secondary | ICD-10-CM | POA: Insufficient documentation

## 2018-06-25 DIAGNOSIS — H3561 Retinal hemorrhage, right eye: Secondary | ICD-10-CM | POA: Diagnosis not present

## 2018-06-25 LAB — CBC WITH DIFFERENTIAL/PLATELET
ABS IMMATURE GRANULOCYTES: 0.01 10*3/uL (ref 0.00–0.07)
BASOS ABS: 0.1 10*3/uL (ref 0.0–0.1)
Basophils Relative: 1 %
EOS PCT: 2 %
Eosinophils Absolute: 0.1 10*3/uL (ref 0.0–0.5)
HEMATOCRIT: 43.3 % (ref 36.0–46.0)
HEMOGLOBIN: 13.4 g/dL (ref 12.0–15.0)
IMMATURE GRANULOCYTES: 0 %
LYMPHS ABS: 1.7 10*3/uL (ref 0.7–4.0)
LYMPHS PCT: 28 %
MCH: 29.6 pg (ref 26.0–34.0)
MCHC: 30.9 g/dL (ref 30.0–36.0)
MCV: 95.8 fL (ref 80.0–100.0)
Monocytes Absolute: 0.5 10*3/uL (ref 0.1–1.0)
Monocytes Relative: 9 %
NRBC: 0 % (ref 0.0–0.2)
Neutro Abs: 3.7 10*3/uL (ref 1.7–7.7)
Neutrophils Relative %: 60 %
Platelets: 218 10*3/uL (ref 150–400)
RBC: 4.52 MIL/uL (ref 3.87–5.11)
RDW: 13.1 % (ref 11.5–15.5)
WBC: 6.1 10*3/uL (ref 4.0–10.5)

## 2018-06-25 LAB — COMPREHENSIVE METABOLIC PANEL
ALT: 9 U/L (ref 0–44)
AST: 14 U/L — AB (ref 15–41)
Albumin: 3.7 g/dL (ref 3.5–5.0)
Alkaline Phosphatase: 107 U/L (ref 38–126)
Anion gap: 8 (ref 5–15)
BUN: 14 mg/dL (ref 8–23)
CHLORIDE: 107 mmol/L (ref 98–111)
CO2: 30 mmol/L (ref 22–32)
CREATININE: 0.9 mg/dL (ref 0.44–1.00)
Calcium: 9.9 mg/dL (ref 8.9–10.3)
GFR calc non Af Amer: 59 mL/min — ABNORMAL LOW (ref >60)
Glucose, Bld: 135 mg/dL — ABNORMAL HIGH (ref 70–99)
Potassium: 4.2 mmol/L (ref 3.5–5.1)
SODIUM: 145 mmol/L (ref 135–145)
Total Bilirubin: 1 mg/dL (ref 0.3–1.2)
Total Protein: 6.5 g/dL (ref 6.5–8.1)

## 2018-06-26 ENCOUNTER — Telehealth: Payer: Self-pay | Admitting: Oncology

## 2018-06-26 ENCOUNTER — Other Ambulatory Visit (HOSPITAL_COMMUNITY)
Admission: AD | Admit: 2018-06-26 | Discharge: 2018-06-26 | Disposition: A | Discharge status: Home / Self Care | Payer: PPO | Source: Skilled Nursing Facility | Attending: Urology | Admitting: Urology

## 2018-06-26 ENCOUNTER — Ambulatory Visit (INDEPENDENT_AMBULATORY_CARE_PROVIDER_SITE_OTHER): Payer: PPO | Admitting: Urology

## 2018-06-26 DIAGNOSIS — R300 Dysuria: Secondary | ICD-10-CM

## 2018-06-26 DIAGNOSIS — N3020 Other chronic cystitis without hematuria: Secondary | ICD-10-CM | POA: Insufficient documentation

## 2018-06-26 DIAGNOSIS — N3281 Overactive bladder: Secondary | ICD-10-CM

## 2018-06-26 NOTE — Telephone Encounter (Signed)
Per 12/17 no los °

## 2018-06-27 LAB — URINE CULTURE: CULTURE: NO GROWTH

## 2018-06-28 ENCOUNTER — Ambulatory Visit (INDEPENDENT_AMBULATORY_CARE_PROVIDER_SITE_OTHER): Payer: PPO | Admitting: Internal Medicine

## 2018-07-05 ENCOUNTER — Encounter (INDEPENDENT_AMBULATORY_CARE_PROVIDER_SITE_OTHER): Payer: Self-pay | Admitting: Internal Medicine

## 2018-07-05 ENCOUNTER — Ambulatory Visit (INDEPENDENT_AMBULATORY_CARE_PROVIDER_SITE_OTHER): Payer: PPO | Admitting: Internal Medicine

## 2018-07-05 VITALS — BP 124/80 | HR 64 | Temp 97.4°F | Ht 61.5 in | Wt 175.3 lb

## 2018-07-05 DIAGNOSIS — K219 Gastro-esophageal reflux disease without esophagitis: Secondary | ICD-10-CM | POA: Diagnosis not present

## 2018-07-05 NOTE — Patient Instructions (Signed)
Continue the Protonix. OV in 1 year.  

## 2018-07-05 NOTE — Progress Notes (Signed)
Subjective:    Patient ID: [name], female    DOB: [birth year], 84 y.o.   MRN: [id number]  HPI Here today for f/u. Last seen in December of 2018. She tells me she is doing good. She does occasionally have hemorrhoids when she takes the Lasix. Her appetite is good. She has lost 5 pounds since her last visit which was intentional.  She has a BM x 1 a day. No melena or BRRB. Remains active with her family. Lives alone.      Hx of GERD and hiatal hernia. EGD 06/02/2014 revealed normal appearing esophageal mucosa without stricture formation. She had large sliding hiatal hernia with organoaxial rotation normal exam of stomach and first and second part of the duodenum.  Take Eliquis for atrial fib. Hx of breast cancer.   Review of Systems Past Medical History:  Diagnosis Date  . Anxiety   . Arteriosclerotic cardiovascular disease (ASCVD)     cath Feb '07- no obstructive dz - 20% proximal LAD only; EF 65%; possible coronary artery spasm  . Atrial fibrillation (HCC)    Paroxysmal on event recorder in 2009; regular supraventricular tachycardia at a rate of 150 also identified on that study representing either atrial flutter or PSVT; onset of persistent AF in 03/2011  . Carcinoma of breast ([place]) 2001   Left mastectomy with positive nodes  . Chest pain    Long-standing and atypical  . Chronic anticoagulation 04/12/2011  . GERD (gastroesophageal reflux disease)    hiatal hernia  . Herpes zoster   . Hyperlipidemia    Lipid profile in 11/2008:282, 176, 64, 201  . Hypertension    diastolic dysfunction; normal CMet and TSH in 11/2009; normal CBC in 04/2010  . Malignant neoplasm of breast (female), unspecified site 05/27/2013   right breast lumpectomy with positive node  . Peripheral neuropathy   . Polio 1946   at age 44  . Rectal bleed   . Renal insufficiency   . Shortness of breath dyspnea     Past Surgical History:  Procedure Laterality Date  . BREAST LUMPECTOMY WITH NEEDLE  LOCALIZATION AND AXILLARY SENTINEL LYMPH NODE BX Right 05/27/2013   Procedure: BREAST LUMPECTOMY WITH NEEDLE LOCALIZATION AND AXILLARY SENTINEL LYMPH NODE BX;  Surgeon: Pedro Earls, MD;  Location: [facility];  Service: General;  Laterality: Right;  . BREAST SURGERY    . CHOLECYSTECTOMY  1999  . COLONOSCOPY  02/22/2011   Procedure: COLONOSCOPY;  Surgeon: Rogene Houston, MD;  [facility];  Service: Endoscopy;  Laterality: N/A;; performed for scant hematochezia  . DILATION AND CURETTAGE OF UTERUS    . ESOPHAGOGASTRODUODENOSCOPY  02/22/2011   Procedure: ESOPHAGOGASTRODUODENOSCOPY (EGD);  Surgeon: Rogene Houston, MD;  [facility];  Service: Endoscopy;  Laterality: N/A;  . ESOPHAGOGASTRODUODENOSCOPY N/A 06/02/2014   Procedure: ESOPHAGOGASTRODUODENOSCOPY (EGD);  Surgeon: Rogene Houston, MD;  [facility];  Service: Endoscopy;  Laterality: N/A;  730  . EYE SURGERY     both cataracts  . MALONEY DILATION N/A 06/02/2014   Procedure: Venia Minks DILATION;  Surgeon: Rogene Houston, MD;  [facility];  Service: Endoscopy;  Laterality: N/A;  . MASTECTOMY  2001   Left;modified radical with lymph node dissection  . PORT-A-CATH REMOVAL     pac in and out 2002    Allergies  Allergen Reactions  . Atorvastatin Other (See Comments)    Myalgias  . Hydrocodone Other (See Comments)    GI  distress.    Current Outpatient Medications on File Prior to Visit  Medication Sig Dispense Refill  . carvedilol (COREG) 12.5 MG tablet Take 12.5 mg by mouth 2 (two) times daily with a meal.     . dorzolamide-timolol (COSOPT) 22.3-6.8 MG/ML ophthalmic solution Place 1 drop into the left eye 2 (two) times daily.     Marland Kitchen ELIQUIS 5 MG TABS tablet TAKE (1) TABLET BY MOUTH TWICE DAILY FOR BLOOD THINNER. 60 tablet 5  . furosemide (LASIX) 40 MG tablet TAKE ONE TABLET BY MOUTH ONCE DAILY. (Patient taking differently: 20 mg. ) 90 tablet 3  . Latanoprostene Bunod  (VYZULTA) 0.024 % SOLN Place 1 drop into the right eye at bedtime.     Marland Kitchen losartan (COZAAR) 25 MG tablet Take 1 tablet (25 mg total) by mouth 2 (two) times daily. 90 tablet 3  . Multiple Vitamins-Minerals (VIS[employer]MULA EYE HEALTH PO) Take by mouth.    Marland Kitchen OVER THE COUNTER MEDICATION Take 1 capsule by mouth daily. [employer] eye supplement    . pantoprazole (PROTONIX) 40 MG tablet TAKE ONE TABLET BY MOUTH DAILY BEFORE BREAKFAST FOR ACID REFLUX. 30 tablet 5   No current facility-administered medications on file prior to visit.         Objective:   Physical Exam Blood pressure 124/80, pulse 64, temperature (!) 97.4 F (36.3 C), height 5' 1.5" (1.562 m), weight 175 lb 4.8 oz (79.5 kg). Alert and oriented. Skin warm and dry. Oral mucosa is moist.   . Sclera anicteric, conjunctivae is pink. Thyroid not enlarged. No cervical lymphadenopathy. Lungs clear. Heart regular rate and rhythm.  Abdomen is soft. Bowel sounds are positive. No hepatomegaly. No abdominal masses felt. No tenderness.  No edema to lower extremities.          Assessment & Plan:  GERD. She is doing well. She will continue the Protoinix. OV in 1 year.

## 2018-07-09 DIAGNOSIS — I482 Chronic atrial fibrillation, unspecified: Secondary | ICD-10-CM | POA: Diagnosis not present

## 2018-07-09 DIAGNOSIS — I5022 Chronic systolic (congestive) heart failure: Secondary | ICD-10-CM | POA: Diagnosis not present

## 2018-07-16 DIAGNOSIS — I482 Chronic atrial fibrillation, unspecified: Secondary | ICD-10-CM | POA: Diagnosis not present

## 2018-07-16 DIAGNOSIS — I5022 Chronic systolic (congestive) heart failure: Secondary | ICD-10-CM | POA: Diagnosis not present

## 2018-09-06 ENCOUNTER — Telehealth: Payer: Self-pay | Admitting: Internal Medicine

## 2018-09-06 NOTE — Telephone Encounter (Signed)
Pt c/o SOB states that started in July and  is worse since Christmas. Denies swelling at this time. States that weight is unchanged.

## 2018-09-06 NOTE — Telephone Encounter (Signed)
Pt is having problems and she's not feeling well, thinks its coming from medication. Would like to speak with someone. Please call [phone]

## 2018-09-09 DIAGNOSIS — H353211 Exudative age-related macular degeneration, right eye, with active choroidal neovascularization: Secondary | ICD-10-CM | POA: Diagnosis not present

## 2018-09-09 DIAGNOSIS — H401112 Primary open-angle glaucoma, right eye, moderate stage: Secondary | ICD-10-CM | POA: Diagnosis not present

## 2018-09-10 NOTE — Telephone Encounter (Signed)
Spoke with pt and notified of appt with Dr. Lovena Le 3/26 @ 10:45. Patient voiced understanding.

## 2018-09-26 DIAGNOSIS — H353211 Exudative age-related macular degeneration, right eye, with active choroidal neovascularization: Secondary | ICD-10-CM | POA: Diagnosis not present

## 2018-09-26 DIAGNOSIS — H353122 Nonexudative age-related macular degeneration, left eye, intermediate dry stage: Secondary | ICD-10-CM | POA: Diagnosis not present

## 2018-09-26 DIAGNOSIS — H401112 Primary open-angle glaucoma, right eye, moderate stage: Secondary | ICD-10-CM | POA: Diagnosis not present

## 2018-09-26 DIAGNOSIS — H353114 Nonexudative age-related macular degeneration, right eye, advanced atrophic with subfoveal involvement: Secondary | ICD-10-CM | POA: Diagnosis not present

## 2018-09-30 ENCOUNTER — Telehealth: Payer: Self-pay | Admitting: Internal Medicine

## 2018-09-30 NOTE — Telephone Encounter (Signed)
Patient would like return phone call to discuss appointment with Dr.Taylor on 3/26/tg

## 2018-09-30 NOTE — Telephone Encounter (Signed)
[id number]    Patient called back and states she is doing better, wants to r/s apt with Dr.Taylor, messaged front desk staff

## 2018-10-03 ENCOUNTER — Ambulatory Visit: Payer: PPO | Admitting: Internal Medicine

## 2018-11-28 DIAGNOSIS — K625 Hemorrhage of anus and rectum: Secondary | ICD-10-CM | POA: Diagnosis not present

## 2018-11-29 DIAGNOSIS — Z7901 Long term (current) use of anticoagulants: Secondary | ICD-10-CM | POA: Diagnosis not present

## 2018-11-29 DIAGNOSIS — K921 Melena: Secondary | ICD-10-CM | POA: Diagnosis not present

## 2018-12-03 ENCOUNTER — Telehealth (INDEPENDENT_AMBULATORY_CARE_PROVIDER_SITE_OTHER): Payer: Self-pay | Admitting: Internal Medicine

## 2018-12-03 NOTE — Telephone Encounter (Signed)
I spoke with patient. Has not had any rectal bleeding since Friday. She says she is not bleeding now. Wants to hold off on OV. She will call me back if it starts again.

## 2018-12-19 ENCOUNTER — Other Ambulatory Visit: Payer: Self-pay | Admitting: Internal Medicine

## 2018-12-23 ENCOUNTER — Telehealth: Payer: Self-pay | Admitting: Internal Medicine

## 2018-12-23 NOTE — Telephone Encounter (Signed)

## 2018-12-25 ENCOUNTER — Telehealth (INDEPENDENT_AMBULATORY_CARE_PROVIDER_SITE_OTHER): Payer: PPO | Admitting: Internal Medicine

## 2018-12-25 ENCOUNTER — Other Ambulatory Visit: Payer: Self-pay

## 2018-12-25 ENCOUNTER — Encounter: Payer: Self-pay | Admitting: Internal Medicine

## 2018-12-25 VITALS — BP 118/86 | HR 70 | Ht 60.0 in | Wt 179.0 lb

## 2018-12-25 DIAGNOSIS — I5032 Chronic diastolic (congestive) heart failure: Secondary | ICD-10-CM

## 2018-12-25 DIAGNOSIS — I10 Essential (primary) hypertension: Secondary | ICD-10-CM | POA: Diagnosis not present

## 2018-12-25 DIAGNOSIS — I4891 Unspecified atrial fibrillation: Secondary | ICD-10-CM

## 2018-12-25 NOTE — Progress Notes (Signed)
Electrophysiology TeleHealth Note   Due to national recommendations of social distancing due to COVID 19, an audio/video telehealth visit is felt to be most appropriate for this patient at this time.  See MyChart message from today for the patient's consent to telehealth for [facility].   Date:  12/25/2018   ID:  [name], DOB [birth year], MRN [id number]  Location: patient's home  Provider location: [address] [place]  Evaluation Performed: Follow-up visit  PCP:  Asencion Noble, MD  Cardiologist:  No primary care provider on file. none Electrophysiologist:  Dr Lovena Le  Chief Complaint:  "I've been alright."  History of Present Illness:    [name] is a 85 y.o. female who presents via audio/video conferencing for a telehealth visit today.  She has chronic diastolic heart failure, atrial fib, and HTN. I placed her on lasix when I saw her several months ago. She c/o feeling bad on the days when she takes the lasix but is short of breath when she doesn't take it.  The patient is otherwise without complaint today.  The patient denies symptoms of fevers, chills, cough, or new SOB worrisome for COVID 19.  Past Medical History:  Diagnosis Date  . Anxiety   . Arteriosclerotic cardiovascular disease (ASCVD)     cath Feb '07- no obstructive dz - 20% proximal LAD only; EF 65%; possible coronary artery spasm  . Atrial fibrillation (HCC)    Paroxysmal on event recorder in 2009; regular supraventricular tachycardia at a rate of 150 also identified on that study representing either atrial flutter or PSVT; onset of persistent AF in 03/2011  . Carcinoma of breast ([place]) 2001   Left mastectomy with positive nodes  . Chest pain    Long-standing and atypical  . Chronic anticoagulation 04/12/2011  . GERD (gastroesophageal reflux disease)    hiatal hernia  . Herpes zoster   . Hyperlipidemia    Lipid profile in 11/2008:282, 176, 64, 201  . Hypertension    diastolic  dysfunction; normal CMet and TSH in 11/2009; normal CBC in 04/2010  . Malignant neoplasm of breast (female), unspecified site 05/27/2013   right breast lumpectomy with positive node  . Peripheral neuropathy   . Polio 1946   at age 81  . Rectal bleed   . Renal insufficiency   . Shortness of breath dyspnea     Past Surgical History:  Procedure Laterality Date  . BREAST LUMPECTOMY WITH NEEDLE LOCALIZATION AND AXILLARY SENTINEL LYMPH NODE BX Right 05/27/2013   Procedure: BREAST LUMPECTOMY WITH NEEDLE LOCALIZATION AND AXILLARY SENTINEL LYMPH NODE BX;  Surgeon: Pedro Earls, MD;  Location: [place];  Service: General;  Laterality: Right;  . BREAST SURGERY    . CHOLECYSTECTOMY  1999  . COLONOSCOPY  02/22/2011   Procedure: COLONOSCOPY;  Surgeon: Rogene Houston, MD;  [facility];  Service: Endoscopy;  Laterality: N/A;; performed for scant hematochezia  . DILATION AND CURETTAGE OF UTERUS    . ESOPHAGOGASTRODUODENOSCOPY  02/22/2011   Procedure: ESOPHAGOGASTRODUODENOSCOPY (EGD);  Surgeon: Rogene Houston, MD;  [facility];  Service: Endoscopy;  Laterality: N/A;  . ESOPHAGOGASTRODUODENOSCOPY N/A 06/02/2014   Procedure: ESOPHAGOGASTRODUODENOSCOPY (EGD);  Surgeon: Rogene Houston, MD;  [facility];  Service: Endoscopy;  Laterality: N/A;  730  . EYE SURGERY     both cataracts  . MALONEY DILATION N/A 06/02/2014   Procedure: Venia Minks DILATION;  Surgeon: Rogene Houston, MD;  [facility];  Service: Endoscopy;  Laterality: N/A;  . MASTECTOMY  2001   Left;modified radical with lymph node dissection  . PORT-A-CATH REMOVAL     pac in and out 2002    Current Outpatient Medications  Medication Sig Dispense Refill  . carvedilol (COREG) 12.5 MG tablet Take 12.5 mg by mouth 2 (two) times daily with a meal.     . dorzolamide-timolol (COSOPT) 22.3-6.8 MG/ML ophthalmic solution Place 1 drop into the left eye 2 (two) times daily.     Marland Kitchen ELIQUIS 5  MG TABS tablet TAKE (1) TABLET BY MOUTH TWICE DAILY FOR BLOOD THINNER. 60 tablet 5  . furosemide (LASIX) 40 MG tablet TAKE ONE TABLET BY MOUTH ONCE DAILY. 90 tablet 0  . Latanoprostene Bunod (VYZULTA) 0.024 % SOLN Place 1 drop into the right eye at bedtime.     Marland Kitchen losartan (COZAAR) 25 MG tablet Take 1 tablet (25 mg total) by mouth 2 (two) times daily. 90 tablet 3  . Multiple Vitamins-Minerals (VISION FORMULA EYE HEALTH PO) Take by mouth.    Marland Kitchen OVER THE COUNTER MEDICATION Take 1 capsule by mouth daily. Vision Health eye supplement    . pantoprazole (PROTONIX) 40 MG tablet TAKE ONE TABLET BY MOUTH DAILY BEFORE BREAKFAST FOR ACID REFLUX. 30 tablet 5   No current facility-administered medications for this visit.     Allergies:   Atorvastatin and Hydrocodone   Social History:  The patient  reports that she has never smoked. She has never used smokeless tobacco. She reports that she does not drink alcohol or use drugs.   Family History:  The patient's  family history includes Cancer in her brother, brother, and sister.   ROS:  Please see the history of present illness.   All other systems are personally reviewed and negative.    Exam:    Vital Signs:  BP 118/86   Pulse 70   Ht 5' (1.524 m)   Wt 179 lb (81.2 kg)   BMI 34.96 kg/m     Labs/Other Tests and Data Reviewed:    Recent Labs: 06/25/2018: ALT 9; BUN 14; Creatinine, Ser 0.90; Hemoglobin 13.4; Platelets 218; Potassium 4.2; Sodium 145   Wt Readings from Last 3 Encounters:  12/25/18 179 lb (81.2 kg)  07/05/18 175 lb 4.8 oz (79.5 kg)  06/25/18 177 lb 6.4 oz (80.5 kg)     Other studies personally reviewed:   ASSESSMENT & PLAN:    1.  Chronic diastolic heart failure - I asked her to reduce her lasix to 3 times a week. She will maintain a low sodium diet. 2. Atrial fib - she is asymptomatic. She will continue her current meds. 3. HTN - her BP is well controlled.  4. COVID 19 screen The patient denies symptoms of COVID 19 at  this time.  The importance of social distancing was discussed today.  Follow-up:  6 months in office Next remote: n/a  Current medicines are reviewed at length with the patient today.   The patient does not have concerns regarding her medicines.  The following changes were made today:  none  Labs/ tests ordered today include: none No orders of the defined types were placed in this encounter.    Patient Risk:  after full review of this patients clinical status, I feel that they are at moderate risk at this time.  Today, I have spent 165 minutes with the patient with telehealth technology discussing all of the above .  Signed, Cristopher Peru, MD  12/25/2018 8:41 AM     [address] [place] [address] [phone] (office) [phone] (fax)

## 2018-12-25 NOTE — Patient Instructions (Signed)
Medication Instructions:  Your physician recommends that you continue on your current medications as directed. Please refer to the Current Medication list given to you today.  If you need a refill on your cardiac medications before your next appointment, please call your pharmacy.   Lab work: NONE   If you have labs (blood work) drawn today and your tests are completely normal, you will receive your results only by: . MyChart Message (if you have MyChart) OR . A paper copy in the mail If you have any lab test that is abnormal or we need to change your treatment, we will call you to review the results.  Testing/Procedures: NONE   Follow-Up: At [facility], you and your health needs are our priority.  As part of our continuing mission to provide you with exceptional heart care, we have created designated Provider Care Teams.  These Care Teams include your primary Cardiologist (physician) and Advanced Practice Providers (APPs -  Physician Assistants and Nurse Practitioners) who all work together to provide you with the care you need, when you need it. You will need a follow up appointment in 6 months.  Please call our office 2 months in advance to schedule this appointment.  You may see Dr. Taylor or one of the following Advanced Practice Providers on your designated Care Team:   Amber Seiler, NP . Renee Ursuy, PA-C  Any Other Special Instructions Will Be Listed Below (If Applicable). Thank you for choosing  [facility]!     

## 2018-12-31 IMAGING — DX DG CHEST 2V
2 series · 2 of 2 positions shown · non-contrast
Comparison: 09/30/2015

CLINICAL DATA: Cough, shortness of breath, code sepsis

EXAM:
CHEST  2 VIEW

[chest ap]
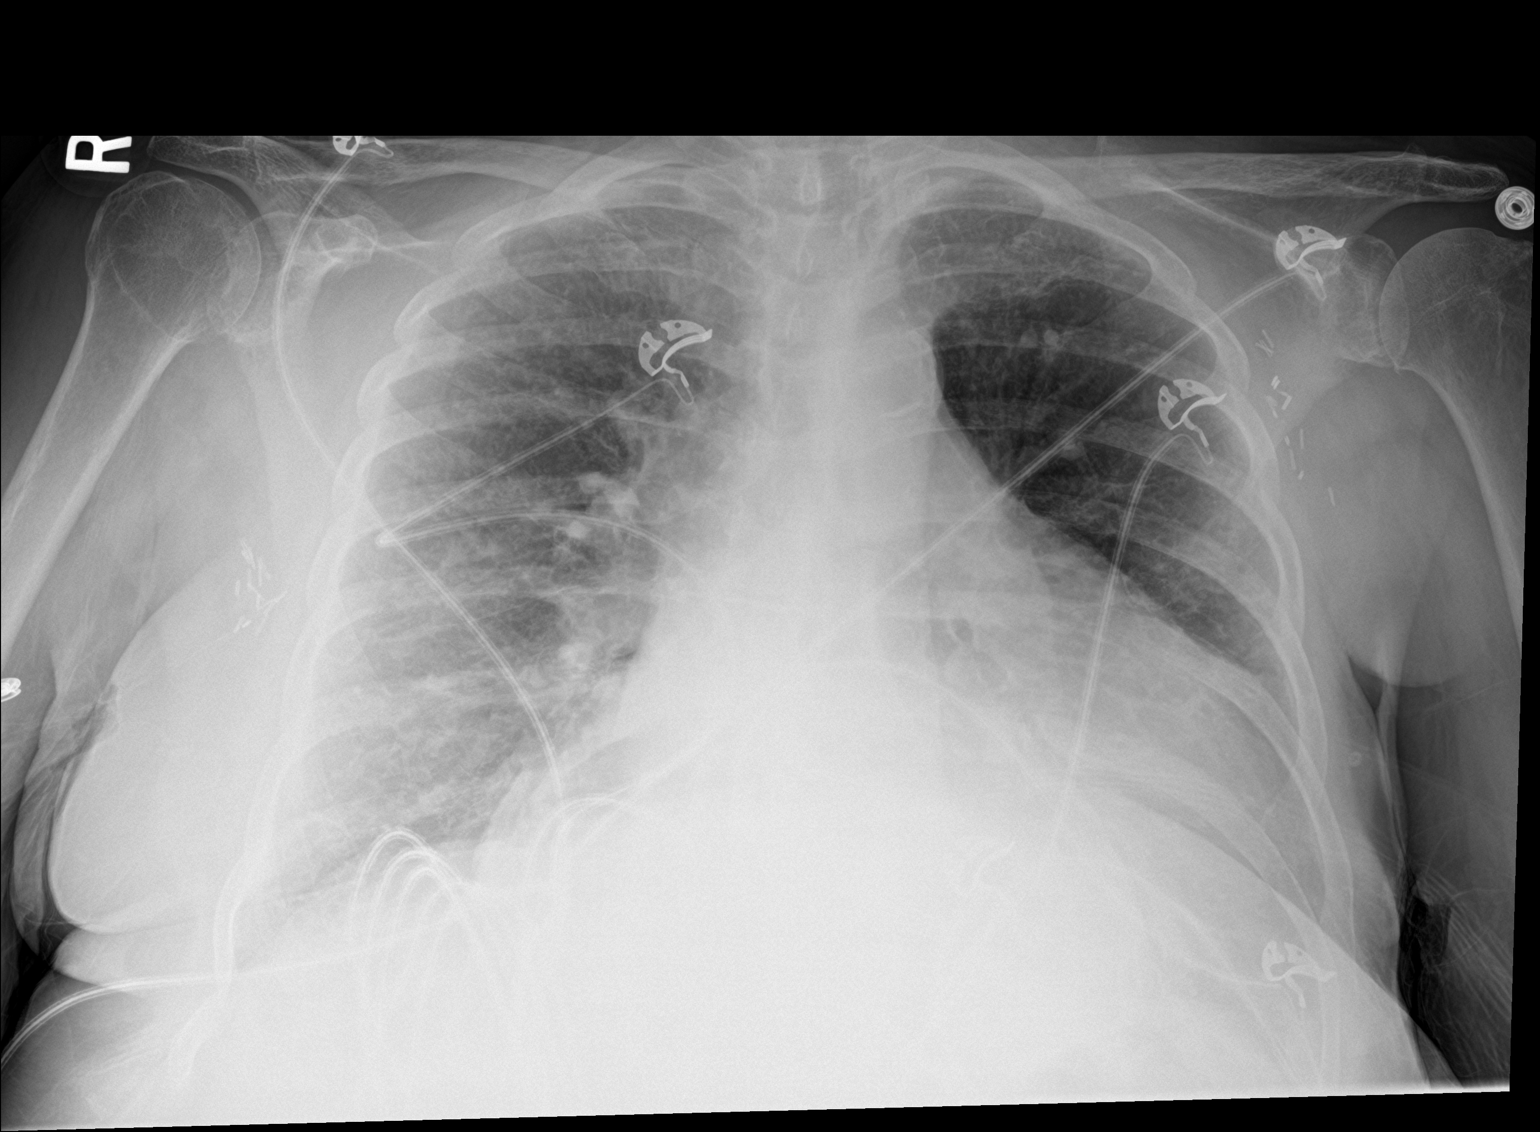

[chest lat]
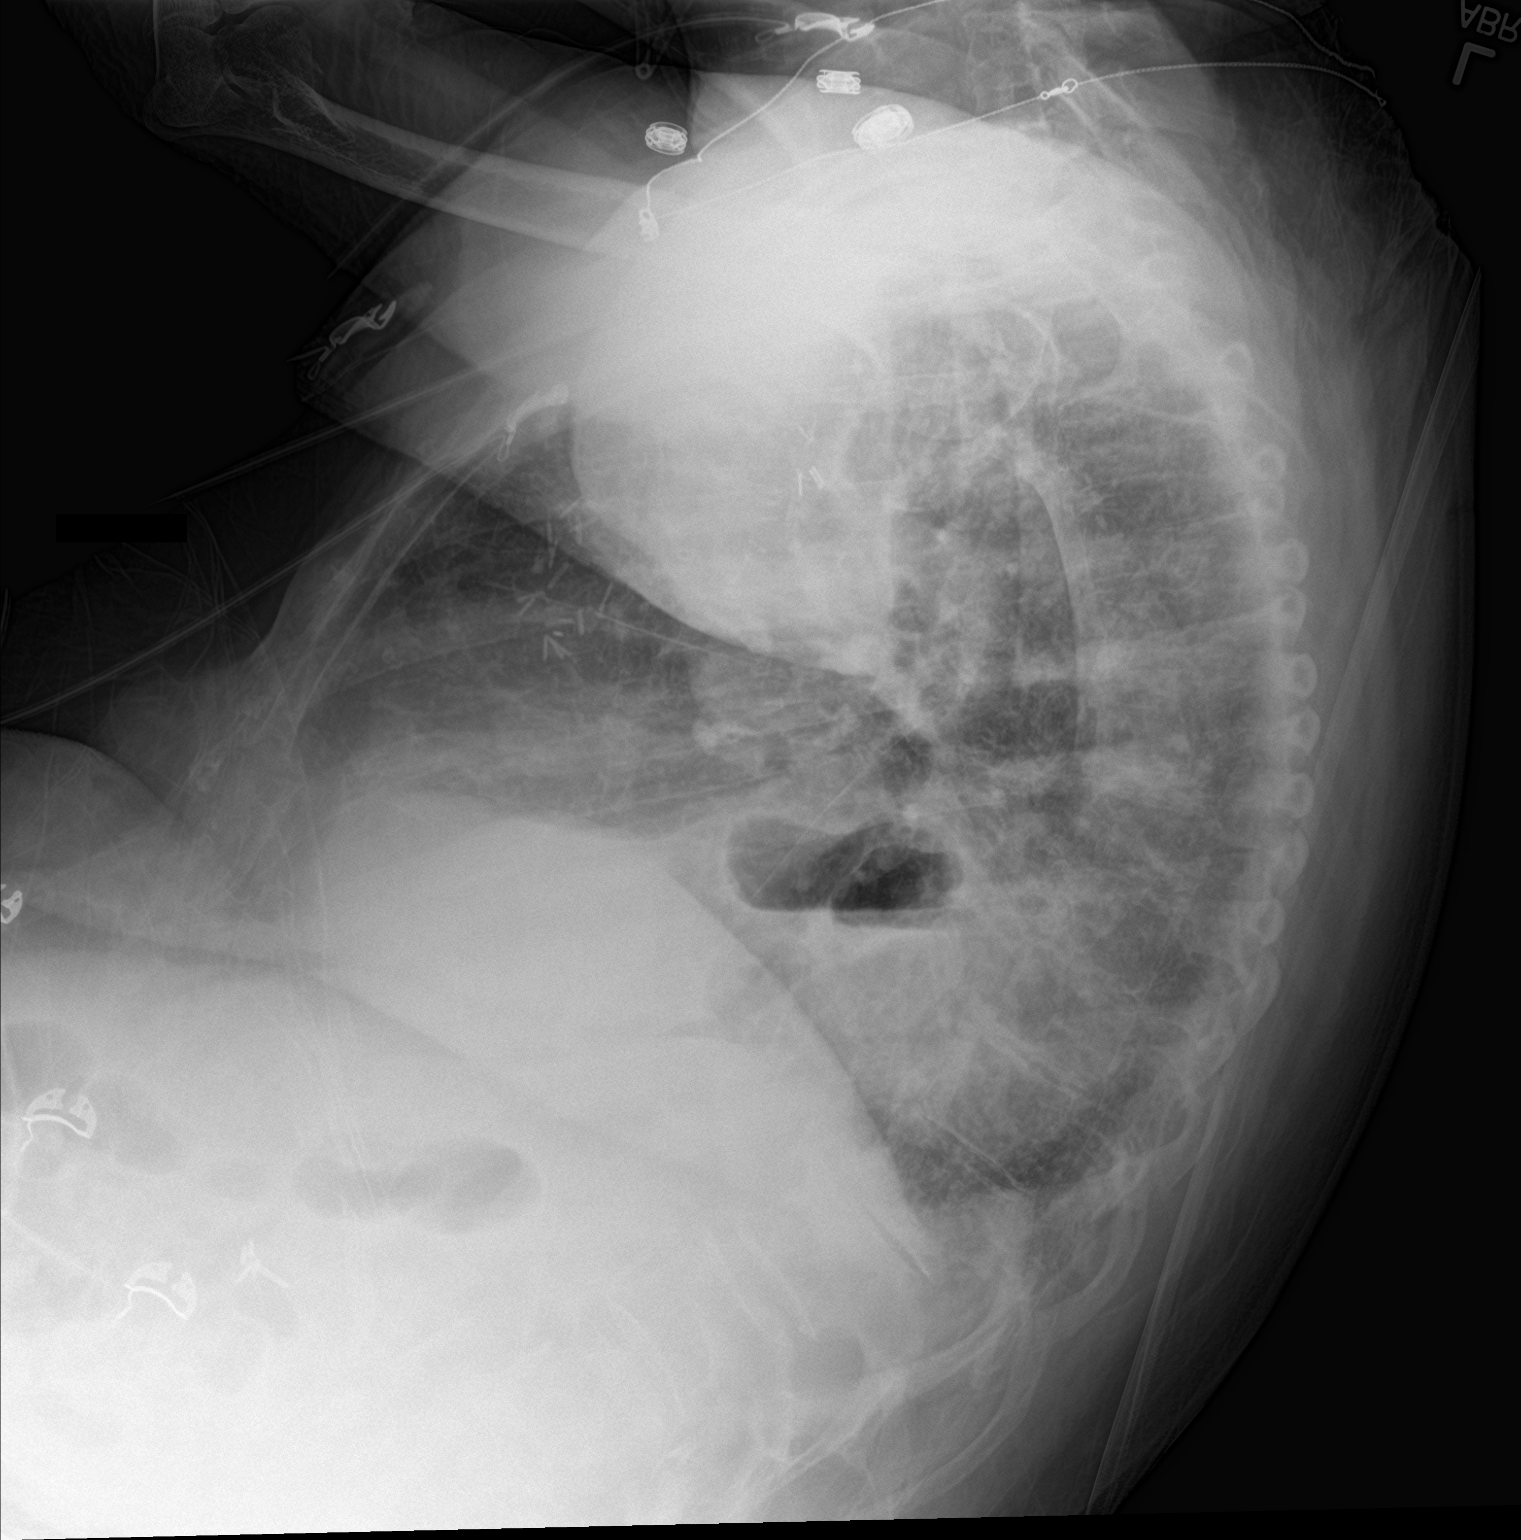

[2 of 2 positions shown; findings below may reference images not displayed]

FINDINGS: Increased interstitial markings. No focal consolidation. No pleural
effusion or pneumothorax.

Cardiomegaly.

Surgical clips in the right lateral chest wall and left axilla.
IMPRESSION: No evidence of acute cardiopulmonary disease.

## 2019-01-28 DIAGNOSIS — H353122 Nonexudative age-related macular degeneration, left eye, intermediate dry stage: Secondary | ICD-10-CM | POA: Diagnosis not present

## 2019-01-28 DIAGNOSIS — H35721 Serous detachment of retinal pigment epithelium, right eye: Secondary | ICD-10-CM | POA: Diagnosis not present

## 2019-01-28 DIAGNOSIS — H353114 Nonexudative age-related macular degeneration, right eye, advanced atrophic with subfoveal involvement: Secondary | ICD-10-CM | POA: Diagnosis not present

## 2019-01-28 DIAGNOSIS — H353211 Exudative age-related macular degeneration, right eye, with active choroidal neovascularization: Secondary | ICD-10-CM | POA: Diagnosis not present

## 2019-02-13 DIAGNOSIS — Z79899 Other long term (current) drug therapy: Secondary | ICD-10-CM | POA: Diagnosis not present

## 2019-02-13 DIAGNOSIS — I5022 Chronic systolic (congestive) heart failure: Secondary | ICD-10-CM | POA: Diagnosis not present

## 2019-02-13 DIAGNOSIS — I482 Chronic atrial fibrillation, unspecified: Secondary | ICD-10-CM | POA: Diagnosis not present

## 2019-02-20 DIAGNOSIS — J302 Other seasonal allergic rhinitis: Secondary | ICD-10-CM | POA: Diagnosis not present

## 2019-02-20 DIAGNOSIS — I5032 Chronic diastolic (congestive) heart failure: Secondary | ICD-10-CM | POA: Diagnosis not present

## 2019-02-20 DIAGNOSIS — I482 Chronic atrial fibrillation, unspecified: Secondary | ICD-10-CM | POA: Diagnosis not present

## 2019-03-13 DIAGNOSIS — H401112 Primary open-angle glaucoma, right eye, moderate stage: Secondary | ICD-10-CM | POA: Diagnosis not present

## 2019-04-14 DIAGNOSIS — X32XXXD Exposure to sunlight, subsequent encounter: Secondary | ICD-10-CM | POA: Diagnosis not present

## 2019-04-14 DIAGNOSIS — L821 Other seborrheic keratosis: Secondary | ICD-10-CM | POA: Diagnosis not present

## 2019-04-14 DIAGNOSIS — I83009 Varicose veins of unspecified lower extremity with ulcer of unspecified site: Secondary | ICD-10-CM | POA: Diagnosis not present

## 2019-04-14 DIAGNOSIS — L570 Actinic keratosis: Secondary | ICD-10-CM | POA: Diagnosis not present

## 2019-04-22 ENCOUNTER — Encounter (INDEPENDENT_AMBULATORY_CARE_PROVIDER_SITE_OTHER): Payer: Self-pay | Admitting: *Deleted

## 2019-05-06 DIAGNOSIS — H401112 Primary open-angle glaucoma, right eye, moderate stage: Secondary | ICD-10-CM | POA: Diagnosis not present

## 2019-05-06 DIAGNOSIS — H353211 Exudative age-related macular degeneration, right eye, with active choroidal neovascularization: Secondary | ICD-10-CM | POA: Diagnosis not present

## 2019-05-06 DIAGNOSIS — H3561 Retinal hemorrhage, right eye: Secondary | ICD-10-CM | POA: Diagnosis not present

## 2019-05-19 DIAGNOSIS — L8990 Pressure ulcer of unspecified site, unspecified stage: Secondary | ICD-10-CM | POA: Diagnosis not present

## 2019-05-19 DIAGNOSIS — L570 Actinic keratosis: Secondary | ICD-10-CM | POA: Diagnosis not present

## 2019-05-19 DIAGNOSIS — X32XXXD Exposure to sunlight, subsequent encounter: Secondary | ICD-10-CM | POA: Diagnosis not present

## 2019-05-22 DIAGNOSIS — R7989 Other specified abnormal findings of blood chemistry: Secondary | ICD-10-CM

## 2019-05-22 DIAGNOSIS — Z79899 Other long term (current) drug therapy: Secondary | ICD-10-CM | POA: Diagnosis not present

## 2019-05-22 DIAGNOSIS — I5032 Chronic diastolic (congestive) heart failure: Secondary | ICD-10-CM | POA: Diagnosis not present

## 2019-05-22 DIAGNOSIS — I482 Chronic atrial fibrillation, unspecified: Secondary | ICD-10-CM | POA: Diagnosis not present

## 2019-05-27 DIAGNOSIS — J302 Other seasonal allergic rhinitis: Secondary | ICD-10-CM | POA: Diagnosis not present

## 2019-05-27 DIAGNOSIS — I5032 Chronic diastolic (congestive) heart failure: Secondary | ICD-10-CM | POA: Diagnosis not present

## 2019-05-27 DIAGNOSIS — I10 Essential (primary) hypertension: Secondary | ICD-10-CM | POA: Diagnosis not present

## 2019-05-27 DIAGNOSIS — I482 Chronic atrial fibrillation, unspecified: Secondary | ICD-10-CM | POA: Diagnosis not present

## 2019-06-09 DIAGNOSIS — Z23 Encounter for immunization: Secondary | ICD-10-CM | POA: Diagnosis not present

## 2019-06-30 ENCOUNTER — Other Ambulatory Visit (HOSPITAL_COMMUNITY): Payer: Self-pay | Admitting: Internal Medicine

## 2019-07-02 ENCOUNTER — Other Ambulatory Visit (HOSPITAL_COMMUNITY): Payer: Self-pay | Admitting: Internal Medicine

## 2019-07-02 DIAGNOSIS — Z1231 Encounter for screening mammogram for malignant neoplasm of breast: Secondary | ICD-10-CM

## 2019-07-07 ENCOUNTER — Ambulatory Visit (INDEPENDENT_AMBULATORY_CARE_PROVIDER_SITE_OTHER): Payer: PPO | Admitting: Nurse Practitioner

## 2019-07-07 ENCOUNTER — Other Ambulatory Visit (HOSPITAL_COMMUNITY): Payer: Self-pay | Admitting: Internal Medicine

## 2019-07-07 ENCOUNTER — Ambulatory Visit (HOSPITAL_COMMUNITY)
Admission: RE | Admit: 2019-07-07 | Discharge: 2019-07-07 | Disposition: A | Discharge status: Home / Self Care | Payer: PPO | Source: Ambulatory Visit | Attending: Internal Medicine | Admitting: Internal Medicine

## 2019-07-07 ENCOUNTER — Other Ambulatory Visit: Payer: Self-pay

## 2019-07-07 DIAGNOSIS — Z1231 Encounter for screening mammogram for malignant neoplasm of breast: Secondary | ICD-10-CM | POA: Diagnosis not present

## 2019-07-09 ENCOUNTER — Ambulatory Visit (INDEPENDENT_AMBULATORY_CARE_PROVIDER_SITE_OTHER): Payer: PPO | Admitting: Nurse Practitioner

## 2019-07-09 ENCOUNTER — Encounter (INDEPENDENT_AMBULATORY_CARE_PROVIDER_SITE_OTHER): Payer: Self-pay | Admitting: Nurse Practitioner

## 2019-07-09 ENCOUNTER — Other Ambulatory Visit: Payer: Self-pay

## 2019-07-09 VITALS — BP 133/88 | HR 74 | Temp 96.6°F | Ht 61.5 in | Wt 177.7 lb

## 2019-07-09 DIAGNOSIS — K219 Gastro-esophageal reflux disease without esophagitis: Secondary | ICD-10-CM

## 2019-07-09 MED ORDER — PANTOPRAZOLE SODIUM 40 MG PO TBEC: 40.0000 mg | DELAYED_RELEASE_TABLET | Freq: Every day | ORAL | 3 refills | Status: DC

## 2019-07-09 NOTE — Progress Notes (Signed)
Patient profile: [name] is a 85 y.o. female seen for evaluation of GERD . Last seen in clinic on 06/2018  History of Present Illness: [name] is seen today for yearly office visit for reflux.  She is on Protonix 40 mg daily, takes in morning before breakfast.  Feels that her current dose works well.  She is only symptomatic with protonix if she eats spicy trigger foods (tries to limit these).  She is symptomatic if she misses Protonix as well.  She does not have any dysphagia, nausea, vomiting.  No epigastric pain.  Reports appetite is good.  Weight is stable since last visit.  Takes Lasix 3 times a week, on days she takes Lasix she tends to have several small stools that are formed.  On days she does not take Lasix she has 1-2 formed stools. Denies constipation or diarrhea.  No rectal bleeding-brief course last summer that resolved spontaneously after few days-will notify me if returns.  No abdominal pain.  Feels well today without complaints.    Wt Readings from Last 3 Encounters:  07/09/19 177 lb 11.2 oz (80.6 kg)  12/25/18 179 lb (81.2 kg)  07/05/18 175 lb 4.8 oz (79.5 kg)     Last Colonoscopy: 2012- Prep excellent. Normal terminal ileum Few small diverticula at sigmoid colon. Moderate size external hemorrhoids   Last Endoscopy: 2015-- No evidence of erosive esophagitis or distal esophageal stricture as suspected based on esophagogram. Therefore esophagus was not dilated. Large sliding hiatal hernia with organoaxial rotation and difficulty encountered in passing scope from proximal into distal half of the stomach. No evidence of pyloric stenosis or peptic ulcer disease.    Past Medical History:  Past Medical History:  Diagnosis Date  . Anxiety   . Arteriosclerotic cardiovascular disease (ASCVD)     cath Feb '07- no obstructive dz - 20% proximal LAD only; EF 65%; possible coronary artery spasm  . Atrial fibrillation (HCC)    Paroxysmal on event recorder in  2009; regular supraventricular tachycardia at a rate of 150 also identified on that study representing either atrial flutter or PSVT; onset of persistent AF in 03/2011  . Carcinoma of breast ([place]) 2001   Left mastectomy with positive nodes  . Chest pain    Long-standing and atypical  . Chronic anticoagulation 04/12/2011  . GERD (gastroesophageal reflux disease)    hiatal hernia  . Herpes zoster   . Hyperlipidemia    Lipid profile in 11/2008:282, 176, 64, 201  . Hypertension    diastolic dysfunction; normal CMet and TSH in 11/2009; normal CBC in 04/2010  . Malignant neoplasm of breast (female), unspecified site 05/27/2013   right breast lumpectomy with positive node  . Peripheral neuropathy   . Polio 1946   at age 8  . Rectal bleed   . Renal insufficiency   . Shortness of breath dyspnea     Problem List: Patient Active Problem List   Diagnosis Date Noted  . Bilateral primary osteoarthritis of knee 04/10/2018  . Pain of left hip joint 04/10/2018  . Acute on chronic systolic CHF (congestive heart failure) ([place]) 07/05/2017  . Influenza-like illness 07/05/2017  . Prolonged QT interval 07/05/2017  . Malignant neoplasm of upper-inner quadrant of left breast in female, estrogen receptor positive ([place]) 06/05/2017  . Lower extremity edema 05/03/2016  . Early satiety 05/07/2014  . Dysphagia, pharyngoesophageal phase 05/07/2014  . Cough 03/09/2014  . Malignant neoplasm of upper-inner quadrant of right breast in female, estrogen receptor  positive ([place]) 06/25/2013  . Bilateral breast cancer ([place]) 05/09/2013  . Wrist fracture, closed 12/10/2012  . Triquetral fracture 10/29/2012  . Sprain of right wrist 10/29/2012  . Chronic anticoagulation 04/12/2011  . Atrial fibrillation ([place]) 03/22/2011  . Arteriosclerotic cardiovascular disease (ASCVD)   . Hyperlipidemia   . Polio   . Hypertension   . GERD (gastroesophageal reflux disease)   . HERPES ZOSTER 11/12/2008  . ANXIETY 07/24/2007  .  PERIPHERAL NEUROPATHY 07/24/2007    Past Surgical History: Past Surgical History:  Procedure Laterality Date  . BREAST LUMPECTOMY WITH NEEDLE LOCALIZATION AND AXILLARY SENTINEL LYMPH NODE BX Right 05/27/2013   Procedure: BREAST LUMPECTOMY WITH NEEDLE LOCALIZATION AND AXILLARY SENTINEL LYMPH NODE BX;  Surgeon: Pedro Earls, MD;  Location: [place];  Service: General;  Laterality: Right;  . BREAST SURGERY    . CHOLECYSTECTOMY  1999  . COLONOSCOPY  02/22/2011   Procedure: COLONOSCOPY;  Surgeon: Rogene Houston, MD;  [facility];  Service: Endoscopy;  Laterality: N/A;; performed for scant hematochezia  . DILATION AND CURETTAGE OF UTERUS    . ESOPHAGOGASTRODUODENOSCOPY  02/22/2011   Procedure: ESOPHAGOGASTRODUODENOSCOPY (EGD);  Surgeon: Rogene Houston, MD;  [facility];  Service: Endoscopy;  Laterality: N/A;  . ESOPHAGOGASTRODUODENOSCOPY N/A 06/02/2014   Procedure: ESOPHAGOGASTRODUODENOSCOPY (EGD);  Surgeon: Rogene Houston, MD;  [facility];  Service: Endoscopy;  Laterality: N/A;  730  . EYE SURGERY     both cataracts  . MALONEY DILATION N/A 06/02/2014   Procedure: Venia Minks DILATION;  Surgeon: Rogene Houston, MD;  [facility];  Service: Endoscopy;  Laterality: N/A;  . MASTECTOMY  2001   Left;modified radical with lymph node dissection  . PORT-A-CATH REMOVAL     pac in and out 2002    Allergies: Allergies  Allergen Reactions  . Atorvastatin Other (See Comments)    Myalgias  . Hydrocodone Other (See Comments)    GI distress.      Home Medications:  Current Outpatient Medications:  .  carvedilol (COREG) 12.5 MG tablet, Take 12.5 mg by mouth 2 (two) times daily with a meal. , Disp: , Rfl:  .  dorzolamide-timolol (COSOPT) 22.3-6.8 MG/ML ophthalmic solution, Place 1 drop into the right eye 2 (two) times daily. , Disp: , Rfl:  .  ELIQUIS 5 MG TABS tablet, TAKE (1) TABLET BY MOUTH TWICE DAILY FOR BLOOD THINNER., Disp: 60  tablet, Rfl: 5 .  furosemide (LASIX) 40 MG tablet, TAKE ONE TABLET BY MOUTH ONCE DAILY., Disp: 90 tablet, Rfl: 0 .  Latanoprostene Bunod (VYZULTA) 0.024 % SOLN, Place 1 drop into the right eye at bedtime. , Disp: , Rfl:  .  losartan (COZAAR) 25 MG tablet, Take 1 tablet (25 mg total) by mouth 2 (two) times daily., Disp: 90 tablet, Rfl: 3 .  Multiple Vitamins-Minerals (VISION FORMULA EYE HEALTH PO), Take by mouth 2 (two) times daily. , Disp: , Rfl:  .  OVER THE COUNTER MEDICATION, Take 1 capsule by mouth daily. Vision Health eye supplement, Disp: , Rfl:  .  pantoprazole (PROTONIX) 40 MG tablet, Take 1 tablet (40 mg total) by mouth daily., Disp: 90 tablet, Rfl: 3   Family History: family history includes Cancer in her brother, brother, and sister.  Denies family history of colon cancer.  28 Brother had lymphoma, other brother had lung cancer.  Social History:   reports that she has never smoked. She has never used smokeless tobacco. She reports that  she does not drink alcohol or use drugs.   Review of Systems: Constitutional: Denies weight loss/weight gain  Eyes: No changes in vision. ENT: No oral lesions, sore throat.  GI: see HPI.  Heme/Lymph: No easy bruising.  CV: No chest pain.  GU: No hematuria.  Integumentary: No rashes.  Neuro: No headaches.  Psych: No depression/anxiety.  Endocrine: No heat/cold intolerance.  Allergic/Immunologic: No urticaria.  Resp: No cough, SOB.  Musculoskeletal: No joint swelling.    Physical Examination: BP 133/88 (BP Location: Right Arm, Patient Position: Sitting, Cuff Size: Large)   Pulse 74   Temp (!) 96.6 F (35.9 C) (Oral)   Ht 5' 1.5" (1.562 m)   Wt 177 lb 11.2 oz (80.6 kg)   BMI 33.03 kg/m  Gen: NAD, alert and oriented x 4 HEENT: PEERLA, EOMI, Neck: supple, no JVD Chest: CTA bilaterally, no wheezes, crackles, or other adventitious sounds CV: RRR, no m/g/c/r Abd: soft, NT, ND, +BS in all four quadrants; no HSM, guarding, ridigity, or  rebound tenderness Ext: no edema, well perfused with 2+ pulses, Skin: no rash or lesions noted on observed skin Lymph: no noted LAD  Data: May 2020 - cbc normal CMP requested from PCP to ensure creatinine normal  Assessment/Plan: Ms. Cains is a 85 y.o. female   1.  GERD-she is well controlled on her PPI, she is symptomatic without PPI. She has no dysphagia or upper GI alarm symptoms. Does have large hiatal hernia on prior endoscopy.  We reviewed diet modifications.  She does not use NSAIDs, only uses Tylenol for pain.  Continue PPI current dose.  Alarm symptoms to contact us with reviewed with her today.  No lower GI symptoms.   Follow-up 1 year-call sooner if needed  Kanitha was seen today for follow-up.  Diagnoses and all orders for this visit:  Chronic GERD  Other orders -     pantoprazole (PROTONIX) 40 MG tablet; Take 1 tablet (40 mg total) by mouth daily.      I personally performed the service, non-incident to. (WP)  Laurine Blazer, PA-C [facility]  I agree with the above evaluation and plan [place]

## 2019-07-09 NOTE — Patient Instructions (Signed)
GERD instructions: -Please avoid lying flat within 2 to 3 hours of eating, this will make reflux symptoms worse. -Some patients find elevating the head of the bed beneficial. -Avoid spicy greasy foods as well as caffeine, coffee, sodas-these food/drinks can worsen heartburn and reflux. -Tobacco will worsen reflux, please try to decrease/eliminate tobacco intake if applicable. -Avoid NSAID products (ibuprofen, aspirin, Advil, Aleve, Goody's, BCs, Alka-Seltzer) - if needing these occasionally please try to take with meal or snack to decrease stomach irritation. -If taking medication for reflux such as prilosec, nexium, aciphex, dexilant, prevacid, protonix - take 20-30 minutes before a meal for maximum effectiveness.   Continue protonix - please call w/ any change in symptoms as we discussed

## 2019-07-30 NOTE — Progress Notes (Signed)
Please notify patient we got her labs from her primary in November and her alkaline phosphate which is one of her liver enzymes is elevated.  I would like to recheck this-I placed an order for repeat hepatic panel to be done at [facility] at her convenience. Thanks.

## 2019-07-30 NOTE — Progress Notes (Signed)
[name] pls review labs on [name] patient, received from PCP. Pls evaluate elevated alk phos. thx

## 2019-08-02 ENCOUNTER — Other Ambulatory Visit: Payer: Self-pay | Admitting: Nurse Practitioner

## 2019-08-05 DIAGNOSIS — H353221 Exudative age-related macular degeneration, left eye, with active choroidal neovascularization: Secondary | ICD-10-CM | POA: Diagnosis not present

## 2019-08-05 DIAGNOSIS — H35721 Serous detachment of retinal pigment epithelium, right eye: Secondary | ICD-10-CM | POA: Diagnosis not present

## 2019-08-05 DIAGNOSIS — H3562 Retinal hemorrhage, left eye: Secondary | ICD-10-CM | POA: Diagnosis not present

## 2019-08-05 DIAGNOSIS — H353211 Exudative age-related macular degeneration, right eye, with active choroidal neovascularization: Secondary | ICD-10-CM | POA: Diagnosis not present

## 2019-08-06 ENCOUNTER — Other Ambulatory Visit (INDEPENDENT_AMBULATORY_CARE_PROVIDER_SITE_OTHER): Payer: Self-pay | Admitting: *Deleted

## 2019-08-06 DIAGNOSIS — R748 Abnormal levels of other serum enzymes: Secondary | ICD-10-CM

## 2019-08-07 ENCOUNTER — Telehealth (INDEPENDENT_AMBULATORY_CARE_PROVIDER_SITE_OTHER): Payer: Self-pay | Admitting: Gastroenterology

## 2019-08-07 NOTE — Telephone Encounter (Signed)
Patient called would like a call back regarding orders for lab work - ph# [phone]

## 2019-08-07 NOTE — Telephone Encounter (Signed)
I called patient -no answer. I LMOM to notify her that her labs when we received them from her primary care showed an elevated alk phos.  We are planning to repeat her alkaline phosphate to see if still elevated.  I have placed a lab order and these can be done at [place]. Asked for return call if she has any questions.

## 2019-08-11 DIAGNOSIS — H353211 Exudative age-related macular degeneration, right eye, with active choroidal neovascularization: Secondary | ICD-10-CM | POA: Diagnosis not present

## 2019-08-11 DIAGNOSIS — H44001 Unspecified purulent endophthalmitis, right eye: Secondary | ICD-10-CM | POA: Diagnosis not present

## 2019-08-11 DIAGNOSIS — H353221 Exudative age-related macular degeneration, left eye, with active choroidal neovascularization: Secondary | ICD-10-CM | POA: Diagnosis not present

## 2019-08-11 DIAGNOSIS — H43391 Other vitreous opacities, right eye: Secondary | ICD-10-CM | POA: Diagnosis not present

## 2019-08-13 DIAGNOSIS — H43311 Vitreous membranes and strands, right eye: Secondary | ICD-10-CM | POA: Diagnosis not present

## 2019-08-13 DIAGNOSIS — H44001 Unspecified purulent endophthalmitis, right eye: Secondary | ICD-10-CM | POA: Diagnosis not present

## 2019-08-13 DIAGNOSIS — H4419 Other endophthalmitis: Secondary | ICD-10-CM | POA: Diagnosis not present

## 2019-08-27 DIAGNOSIS — Z09 Encounter for follow-up examination after completed treatment for conditions other than malignant neoplasm: Secondary | ICD-10-CM | POA: Diagnosis not present

## 2019-08-27 DIAGNOSIS — H4311 Vitreous hemorrhage, right eye: Secondary | ICD-10-CM | POA: Diagnosis not present

## 2019-09-11 DIAGNOSIS — H353221 Exudative age-related macular degeneration, left eye, with active choroidal neovascularization: Secondary | ICD-10-CM | POA: Diagnosis not present

## 2019-09-11 DIAGNOSIS — H43813 Vitreous degeneration, bilateral: Secondary | ICD-10-CM | POA: Diagnosis not present

## 2019-09-11 DIAGNOSIS — H353122 Nonexudative age-related macular degeneration, left eye, intermediate dry stage: Secondary | ICD-10-CM | POA: Diagnosis not present

## 2019-09-11 DIAGNOSIS — H353211 Exudative age-related macular degeneration, right eye, with active choroidal neovascularization: Secondary | ICD-10-CM | POA: Diagnosis not present

## 2019-09-18 ENCOUNTER — Ambulatory Visit: Payer: PPO | Admitting: Internal Medicine

## 2019-09-18 ENCOUNTER — Encounter: Payer: Self-pay | Admitting: Internal Medicine

## 2019-09-18 ENCOUNTER — Other Ambulatory Visit: Payer: Self-pay

## 2019-09-18 VITALS — BP 120/84 | HR 64 | Temp 97.1°F | Ht 60.0 in | Wt 178.0 lb

## 2019-09-18 DIAGNOSIS — R0602 Shortness of breath: Secondary | ICD-10-CM | POA: Diagnosis not present

## 2019-09-18 DIAGNOSIS — I5032 Chronic diastolic (congestive) heart failure: Secondary | ICD-10-CM | POA: Diagnosis not present

## 2019-09-18 DIAGNOSIS — I4891 Unspecified atrial fibrillation: Secondary | ICD-10-CM

## 2019-09-18 NOTE — Progress Notes (Signed)
HPI Mrs Canepa returns today for followup. She is a pleasant 86 yo woman with a h/o  HTN, chronic diastolic heart failure, and chronic atrial fib. She has class 2 symptoms. She has mild LV dysfunction. Allergies  Allergen Reactions  . Atorvastatin Other (See Comments)    Myalgias  . Hydrocodone Other (See Comments)    GI distress.     Current Outpatient Medications  Medication Sig Dispense Refill  . carvedilol (COREG) 12.5 MG tablet Take 12.5 mg by mouth 2 (two) times daily with a meal.     . dorzolamide-timolol (COSOPT) 22.3-6.8 MG/ML ophthalmic solution Place 1 drop into the right eye 2 (two) times daily.     Marland Kitchen ELIQUIS 5 MG TABS tablet TAKE (1) TABLET BY MOUTH TWICE DAILY FOR BLOOD THINNER. 60 tablet 5  . furosemide (LASIX) 40 MG tablet Take 40 mg by mouth 3 (three) times a week. & one by mouth as needed on other days    . Latanoprostene Bunod (VYZULTA) 0.024 % SOLN Place 1 drop into the right eye at bedtime.     Marland Kitchen losartan (COZAAR) 25 MG tablet Take 1 tablet (25 mg total) by mouth 2 (two) times daily. 90 tablet 3  . Multiple Vitamins-Minerals (VISION FORMULA EYE HEALTH PO) Take 1 tablet by mouth 2 (two) times daily.     Marland Kitchen OVER THE COUNTER MEDICATION Take 1 capsule by mouth daily. Vision Health eye supplement    . pantoprazole (PROTONIX) 40 MG tablet Take 1 tablet (40 mg total) by mouth daily. 90 tablet 3   No current facility-administered medications for this visit.     Past Medical History:  Diagnosis Date  . Anxiety   . Arteriosclerotic cardiovascular disease (ASCVD)     cath Feb '07- no obstructive dz - 20% proximal LAD only; EF 65%; possible coronary artery spasm  . Atrial fibrillation (HCC)    Paroxysmal on event recorder in 2009; regular supraventricular tachycardia at a rate of 150 also identified on that study representing either atrial flutter or PSVT; onset of persistent AF in 03/2011  . Carcinoma of breast ([place]) 2001   Left mastectomy with positive nodes  .  Chest pain    Long-standing and atypical  . Chronic anticoagulation 04/12/2011  . GERD (gastroesophageal reflux disease)    hiatal hernia  . Herpes zoster   . Hyperlipidemia    Lipid profile in 11/2008:282, 176, 64, 201  . Hypertension    diastolic dysfunction; normal CMet and TSH in 11/2009; normal CBC in 04/2010  . Malignant neoplasm of breast (female), unspecified site 05/27/2013   right breast lumpectomy with positive node  . Peripheral neuropathy   . Polio 1946   at age 87  . Rectal bleed   . Renal insufficiency   . Shortness of breath dyspnea     ROS:   All systems reviewed and negative except as noted in the HPI.   Past Surgical History:  Procedure Laterality Date  . BREAST LUMPECTOMY WITH NEEDLE LOCALIZATION AND AXILLARY SENTINEL LYMPH NODE BX Right 05/27/2013   Procedure: BREAST LUMPECTOMY WITH NEEDLE LOCALIZATION AND AXILLARY SENTINEL LYMPH NODE BX;  Surgeon: Pedro Earls, MD;  Location: [facility];  Service: General;  Laterality: Right;  . BREAST SURGERY    . CHOLECYSTECTOMY  1999  . COLONOSCOPY  02/22/2011   Procedure: COLONOSCOPY;  Surgeon: Rogene Houston, MD;  [facility];  Service: Endoscopy;  Laterality: N/A;; performed for scant hematochezia  .  DILATION AND CURETTAGE OF UTERUS    . ESOPHAGOGASTRODUODENOSCOPY  02/22/2011   Procedure: ESOPHAGOGASTRODUODENOSCOPY (EGD);  Surgeon: Rogene Houston, MD;  [facility];  Service: Endoscopy;  Laterality: N/A;  . ESOPHAGOGASTRODUODENOSCOPY N/A 06/02/2014   Procedure: ESOPHAGOGASTRODUODENOSCOPY (EGD);  Surgeon: Rogene Houston, MD;  [facility];  Service: Endoscopy;  Laterality: N/A;  730  . EYE SURGERY     both cataracts  . MALONEY DILATION N/A 06/02/2014   Procedure: Venia Minks DILATION;  Surgeon: Rogene Houston, MD;  [facility];  Service: Endoscopy;  Laterality: N/A;  . MASTECTOMY  2001   Left;modified radical with lymph node dissection  . PORT-A-CATH  REMOVAL     pac in and out 2002     Family History  Problem Relation Age of Onset  . Cancer Sister        lung  . Cancer Brother        lipoma  . Cancer Brother        kidney     Social History   Socioeconomic History  . Marital status: Widowed    Spouse name: Not on file  . Number of children: 2  . Years of education: Not on file  . Highest education level: Not on file  Occupational History  . Occupation: homemaker  Tobacco Use  . Smoking status: Never Smoker  . Smokeless tobacco: Never Used  Substance and Sexual Activity  . Alcohol use: No    Alcohol/week: 0.0 standard drinks  . Drug use: No  . Sexual activity: Not Currently    Birth control/protection: Post-menopausal  Other Topics Concern  . Not on file  Social History Narrative  . Not on file   Social Determinants of Health   Financial Resource Strain:   . Difficulty of Paying Living Expenses:   Food Insecurity:   . Worried About Charity fundraiser in the Last Year:   . Arboriculturist in the Last Year:   Transportation Needs:   . Film/video editor (Medical):   Marland Kitchen Lack of Transportation (Non-Medical):   Physical Activity:   . Days of Exercise per Week:   . Minutes of Exercise per Session:   Stress:   . Feeling of Stress :   Social Connections:   . Frequency of Communication with Friends and Family:   . Frequency of Social Gatherings with Friends and Family:   . Attends Religious Services:   . Active Member of Clubs or Organizations:   . Attends Archivist Meetings:   Marland Kitchen Marital Status:   Intimate Partner Violence:   . Fear of Current or Ex-Partner:   . Emotionally Abused:   Marland Kitchen Physically Abused:   . Sexually Abused:      BP 120/84   Pulse 64   Temp (!) 97.1 F (36.2 C)   Ht 5' (1.524 m)   Wt 178 lb (80.7 kg)   SpO2 95%   BMI 34.76 kg/m   Physical Exam:  Well appearing NAD HEENT: Unremarkable Neck:  No JVD, no thyromegally Lymphatics:  No adenopathy Back:  No CVA  tenderness Lungs:  Clear with no wheezes HEART:  Regular rate rhythm, no murmurs, no rubs, no clicks Abd:  soft, positive bowel sounds, no organomegally, no rebound, no guarding Ext:  2 plus pulses, no edema, no cyanosis, no clubbing Skin:  No rashes no nodules Neuro:  CN II through XII intact, motor grossly intact  DEVICE  Normal device function.  See PaceArt for details.   Assess/Plan: 1. Chronic diastolic heart failure - her symptoms are class 2. I encouraged her to avoid salty food and increase her physical activity. I considered adding Entresto but will hold off for now. 2. Perm atrial fib - her VR is well controlled. She denies palpitations. 3. HTN - her bp is well controlled. No change in meds. 4. Dyspnea - she is taking her lasix 3 times a week. I encouraged her to take her lasix daily and avoid salty food.  Mikle Bosworth.D.

## 2019-09-18 NOTE — Patient Instructions (Signed)
Medication Instructions:  Your physician recommends that you continue on your current medications as directed. Please refer to the Current Medication list given to you today.  *If you need a refill on your cardiac medications before your next appointment, please call your pharmacy*   Lab Work: NONE   If you have labs (blood work) drawn today and your tests are completely normal, you will receive your results only by: MyChart Message (if you have MyChart) OR A paper copy in the mail If you have any lab test that is abnormal or we need to change your treatment, we will call you to review the results.   Testing/Procedures: NONE    Follow-Up: At [facility], you and your health needs are our priority.  As part of our continuing mission to provide you with exceptional heart care, we have created designated Provider Care Teams.  These Care Teams include your primary Cardiologist (physician) and Advanced Practice Providers (APPs -  Physician Assistants and Nurse Practitioners) who all work together to provide you with the care you need, when you need it.  We recommend signing up for the patient portal called "MyChart".  Sign up information is provided on this After Visit Summary.  MyChart is used to connect with patients for Virtual Visits (Telemedicine).  Patients are able to view lab/test results, encounter notes, upcoming appointments, etc.  Non-urgent messages can be sent to your provider as well.   To learn more about what you can do with MyChart, go to [email or web address].    Your next appointment:   1 year(s)  The format for your next appointment:   In Person  Provider:   Dr. Taylor    Other Instructions Thank you for choosing [facility]!    

## 2019-09-22 DIAGNOSIS — I10 Essential (primary) hypertension: Secondary | ICD-10-CM | POA: Diagnosis not present

## 2019-09-22 DIAGNOSIS — I5032 Chronic diastolic (congestive) heart failure: Secondary | ICD-10-CM | POA: Diagnosis not present

## 2019-09-22 DIAGNOSIS — Z79899 Other long term (current) drug therapy: Secondary | ICD-10-CM | POA: Diagnosis not present

## 2019-09-22 DIAGNOSIS — I482 Chronic atrial fibrillation, unspecified: Secondary | ICD-10-CM | POA: Diagnosis not present

## 2019-09-24 DIAGNOSIS — H401112 Primary open-angle glaucoma, right eye, moderate stage: Secondary | ICD-10-CM | POA: Diagnosis not present

## 2019-09-29 DIAGNOSIS — I482 Chronic atrial fibrillation, unspecified: Secondary | ICD-10-CM | POA: Diagnosis not present

## 2019-09-29 DIAGNOSIS — I10 Essential (primary) hypertension: Secondary | ICD-10-CM | POA: Diagnosis not present

## 2019-09-29 DIAGNOSIS — I5032 Chronic diastolic (congestive) heart failure: Secondary | ICD-10-CM | POA: Diagnosis not present

## 2019-10-23 ENCOUNTER — Ambulatory Visit (INDEPENDENT_AMBULATORY_CARE_PROVIDER_SITE_OTHER): Payer: PPO | Admitting: Ophthalmology

## 2019-10-23 ENCOUNTER — Encounter (INDEPENDENT_AMBULATORY_CARE_PROVIDER_SITE_OTHER): Payer: Self-pay | Admitting: Ophthalmology

## 2019-10-23 ENCOUNTER — Other Ambulatory Visit: Payer: Self-pay

## 2019-10-23 DIAGNOSIS — H353221 Exudative age-related macular degeneration, left eye, with active choroidal neovascularization: Secondary | ICD-10-CM | POA: Diagnosis not present

## 2019-10-23 DIAGNOSIS — H43812 Vitreous degeneration, left eye: Secondary | ICD-10-CM | POA: Diagnosis not present

## 2019-10-23 DIAGNOSIS — H353211 Exudative age-related macular degeneration, right eye, with active choroidal neovascularization: Secondary | ICD-10-CM | POA: Insufficient documentation

## 2019-10-23 DIAGNOSIS — H4311 Vitreous hemorrhage, right eye: Secondary | ICD-10-CM | POA: Diagnosis not present

## 2019-10-23 DIAGNOSIS — H44001 Unspecified purulent endophthalmitis, right eye: Secondary | ICD-10-CM | POA: Insufficient documentation

## 2019-10-23 DIAGNOSIS — Z09 Encounter for follow-up examination after completed treatment for conditions other than malignant neoplasm: Secondary | ICD-10-CM | POA: Insufficient documentation

## 2019-10-23 HISTORY — DX: Vitreous hemorrhage, right eye: H43.11

## 2019-10-23 MED ORDER — BEVACIZUMAB CHEMO INJECTION 1.25MG/0.05ML SYRINGE FOR KALEIDOSCOPE
1.2500 mg | INTRAVITREAL | Status: AC | PRN
  Administered 2019-10-23: 10:00:00 1.25 mg via INTRAVITREAL

## 2019-10-23 NOTE — Assessment & Plan Note (Signed)
The nature of wet macular degeneration was discussed with the patient.  Forms of therapy reviewed include the use of Anti-VEGF medications injected painlessly into the eye, as well as other possible treatment modalities, including thermal laser therapy. Fellow eye involvement and risks were discussed with the patient. Upon the finding of wet age related macular degeneration, treatment will be offered. The treatment regimen is on a treat as needed basis with the intent to treat if necessary and extend interval of exams when possible. On average 1 out of 6 patients do not need lifetime therapy. However, the risk of recurrent disease is high for a lifetime.  Initially monthly, then periodic, examinations and evaluations will determine whether the next treatment is required on the day of the examination.    OS, stable and improved currently on 6-week interval exam

## 2019-10-23 NOTE — Assessment & Plan Note (Signed)

## 2019-10-23 NOTE — Progress Notes (Signed)
10/23/2019     CHIEF COMPLAINT Patient presents for Retina Follow Up   HISTORY OF PRESENT ILLNESS: [name] is a 86 y.o. female who presents to the clinic today for:   HPI    Retina Follow Up    Patient presents with  Wet AMD.  In left eye.  Duration of 6 weeks.  Since onset it is stable.          Comments    6 week follow up - OCT OU, Possible Avastin OS Patient denies change in vision and overall has no complaints.       Last edited by Gerda Diss, Lyman on 10/23/2019  8:49 AM. (History)      Referring physician: Asencion Noble, MD [address] [place],  [place] [address]  HISTORICAL INFORMATION:   Selected notes from the MEDICAL RECORD NUMBER       CURRENT MEDICATIONS: Current Outpatient Medications (Ophthalmic Drugs)  Medication Sig  . dorzolamide-timolol (COSOPT) 22.3-6.8 MG/ML ophthalmic solution Place 1 drop into the right eye 2 (two) times daily.   . Latanoprostene Bunod (VYZULTA) 0.024 % SOLN Place 1 drop into the right eye at bedtime.    No current facility-administered medications for this visit. (Ophthalmic Drugs)   Current Outpatient Medications (Other)  Medication Sig  . carvedilol (COREG) 12.5 MG tablet Take 12.5 mg by mouth 2 (two) times daily with a meal.   . ELIQUIS 5 MG TABS tablet TAKE (1) TABLET BY MOUTH TWICE DAILY FOR BLOOD THINNER.  . furosemide (LASIX) 40 MG tablet Take 40 mg by mouth 3 (three) times a week. & one by mouth as needed on other days  . losartan (COZAAR) 25 MG tablet Take 1 tablet (25 mg total) by mouth 2 (two) times daily.  . Multiple Vitamins-Minerals (VISION FORMULA EYE HEALTH PO) Take 1 tablet by mouth 2 (two) times daily.   Marland Kitchen OVER THE COUNTER MEDICATION Take 1 capsule by mouth daily. Vision Health eye supplement  . pantoprazole (PROTONIX) 40 MG tablet Take 1 tablet (40 mg total) by mouth daily.   No current facility-administered medications for this visit. (Other)      REVIEW OF  SYSTEMS:    ALLERGIES Allergies  Allergen Reactions  . Atorvastatin Other (See Comments)    Myalgias  . Hydrocodone Other (See Comments)    GI distress.    PAST MEDICAL HISTORY Past Medical History:  Diagnosis Date  . Anxiety   . Arteriosclerotic cardiovascular disease (ASCVD)     cath Feb '07- no obstructive dz - 20% proximal LAD only; EF 65%; possible coronary artery spasm  . Atrial fibrillation (HCC)    Paroxysmal on event recorder in 2009; regular supraventricular tachycardia at a rate of 150 also identified on that study representing either atrial flutter or PSVT; onset of persistent AF in 03/2011  . Carcinoma of breast ([place]) 2001   Left mastectomy with positive nodes  . Chest pain    Long-standing and atypical  . Chronic anticoagulation 04/12/2011  . GERD (gastroesophageal reflux disease)    hiatal hernia  . Herpes zoster   . Hyperlipidemia    Lipid profile in 11/2008:282, 176, 64, 201  . Hypertension    diastolic dysfunction; normal CMet and TSH in 11/2009; normal CBC in 04/2010  . Malignant neoplasm of breast (female), unspecified site 05/27/2013   right breast lumpectomy with positive node  . Peripheral neuropathy   . Polio 1946   at age 60  . Rectal bleed   .  Renal insufficiency   . Shortness of breath dyspnea   . Vitreous hemorrhage of right eye ([place]) 10/23/2019   Past Surgical History:  Procedure Laterality Date  . BREAST LUMPECTOMY WITH NEEDLE LOCALIZATION AND AXILLARY SENTINEL LYMPH NODE BX Right 05/27/2013   Procedure: BREAST LUMPECTOMY WITH NEEDLE LOCALIZATION AND AXILLARY SENTINEL LYMPH NODE BX;  Surgeon: Pedro Earls, MD;  Location: [facility];  Service: General;  Laterality: Right;  . BREAST SURGERY    . CHOLECYSTECTOMY  1999  . COLONOSCOPY  02/22/2011   Procedure: COLONOSCOPY;  Surgeon: Rogene Houston, MD;  [facility];  Service: Endoscopy;  Laterality: N/A;; performed for scant hematochezia  . DILATION AND CURETTAGE OF  UTERUS    . ESOPHAGOGASTRODUODENOSCOPY  02/22/2011   Procedure: ESOPHAGOGASTRODUODENOSCOPY (EGD);  Surgeon: Rogene Houston, MD;  [facility];  Service: Endoscopy;  Laterality: N/A;  . ESOPHAGOGASTRODUODENOSCOPY N/A 06/02/2014   Procedure: ESOPHAGOGASTRODUODENOSCOPY (EGD);  Surgeon: Rogene Houston, MD;  [facility];  Service: Endoscopy;  Laterality: N/A;  730  . EYE SURGERY     both cataracts  . MALONEY DILATION N/A 06/02/2014   Procedure: Venia Minks DILATION;  Surgeon: Rogene Houston, MD;  [facility];  Service: Endoscopy;  Laterality: N/A;  . MASTECTOMY  2001   Left;modified radical with lymph node dissection  . PORT-A-CATH REMOVAL     pac in and out 2002    FAMILY HISTORY Family History  Problem Relation Age of Onset  . Cancer Sister        lung  . Cancer Brother        lipoma  . Cancer Brother        kidney    SOCIAL HISTORY Social History   Tobacco Use  . Smoking status: Never Smoker  . Smokeless tobacco: Never Used  Substance Use Topics  . Alcohol use: No    Alcohol/week: 0.0 standard drinks  . Drug use: No         OPHTHALMIC EXAM:  Base Eye Exam    Visual Acuity (Snellen - Linear)      Right Left   Dist [place] 20/50-2 20/50-1   Dist ph  20/40+2 20/40-1       Tonometry (Tonopen, 8:58 AM)      Right Left   Pressure 15 18       Pupils      Pupils Dark Light Shape React APD   Right PERRL 5 4 Round Minimal None   Left PERRL 5 3 Round Brisk None       Visual Fields (Counting fingers)      Left Right    Full Full       Extraocular Movement      Right Left    Full Full       Neuro/Psych    Oriented x3: Yes   Mood/Affect: Normal       Dilation    Left eye: 1.0% Mydriacyl, 2.5% Phenylephrine @ 8:58 AM        Slit Lamp and Fundus Exam    External Exam      Right Left   External Normal Normal       Slit Lamp Exam      Right Left   Lids/Lashes Normal Normal   Conjunctiva/Sclera White and quiet White  and quiet   Cornea Clear Clear   Anterior Chamber Deep and quiet Deep and quiet   Iris Round and reactive Round and  reactive   Lens Posterior chamber intraocular lens Posterior chamber intraocular lens   Anterior Vitreous Normal Normal       Fundus Exam      Right Left   Posterior Vitreous clear , vitrectomized Posterior vitreous detachment   Disc  Normal   C/D Ratio  0.4   Macula  Disciform scar, [place], no exudates, no hemorrhage, no macular thickening, Mottling, Retinal pigment epithelial mottling, Atrophy, Retinal pigment epithelial atrophy, Age related macular degeneration, Early age related macular degeneration   Vessels  Normal   Periphery  Normal          IMAGING AND PROCEDURES  Imaging and Procedures for 10/23/19  OCT, Retina - OU - Both Eyes       Right Eye Quality was good. Scan locations included subfoveal. Central Foveal Thickness: 283. Progression has been stable. Findings include outer retinal tubulation, choroidal neovascular membrane, pigment epithelial detachment, no SRF, no IRF.   Left Eye Central Foveal Thickness: 237. Progression has improved. Findings include normal observations, no SRF, cystoid macular edema.   Notes OD stable, off therapy for 5 months. OS improved with minor cystoid macular edema and intraretinal fluid temporal to fovea.  Feet Avastin OS today at week interval         Intravitreal Injection, Pharmacologic Agent - OS - Left Eye       Time Out 10/23/2019. 10:11 AM. Confirmed correct patient, procedure, site, and patient consented.   Anesthesia Topical anesthesia was used. Anesthetic medications included [place] 3.5%.   Procedure Preparation included 5% betadine to ocular surface, 10% betadine to eyelids. A 30 gauge needle was used.   Injection:  1.25 mg Bevacizumab (AVASTIN) SOLN   NDC: [id number], Lot: [id number]   Route: Intravitreal, Site: Left Eye, Waste: 0 mg  Post-op Post injection exam found visual acuity of at least  counting fingers. The patient tolerated the procedure well. There were no complications. The patient received written and verbal post procedure care education. Post injection medications were not given.                 ASSESSMENT/PLAN:  Posterior vitreous detachment of left eye   The nature of posterior vitreous detachment was discussed with the patient as well as its physiology, its age prevalence, and its possible implication regarding retinal breaks and detachment.  An informational brochure was given to the patient.  All the patient's questions were answered.  The patient was asked to return if new or different flashes or floaters develops.   Patient was instructed to contact office immediately if any changes were noticed. I explained to the patient that vitreous inside the eye is similar to jello inside a bowl. As the jello melts it can start to pull away from the bowl, similarly the vitreous throughout our lives can begin to pull away from the retina. That process is called a posterior vitreous detachment. In some cases, the vitreous can tug hard enough on the retina to form a retinal tear. I discussed with the patient the signs and symptoms of a retinal detachment.  Do not rub the eye.  Exudative age-related macular degeneration of left eye with active choroidal neovascularization (HCC) The nature of wet macular degeneration was discussed with the patient.  Forms of therapy reviewed include the use of Anti-VEGF medications injected painlessly into the eye, as well as other possible treatment modalities, including thermal laser therapy. Fellow eye involvement and risks were discussed with the patient. Upon the finding of wet age related  macular degeneration, treatment will be offered. The treatment regimen is on a treat as needed basis with the intent to treat if necessary and extend interval of exams when possible. On average 1 out of 6 patients do not need lifetime therapy. However, the risk  of recurrent disease is high for a lifetime.  Initially monthly, then periodic, examinations and evaluations will determine whether the next treatment is required on the day of the examination.    OS, stable and improved currently on 6-week interval exam      ICD-10-CM   1. Exudative age-related macular degeneration of left eye with active choroidal neovascularization (HCC)  H35.3221 OCT, Retina - OU - Both Eyes    Intravitreal Injection, Pharmacologic Agent - OS - Left Eye    Bevacizumab (AVASTIN) SOLN 1.25 mg  2. Vitreous hemorrhage of right eye ([place])  H43.11   3. Exudative age-related macular degeneration of right eye with active choroidal neovascularization ([place])  H35.3211   4. Posterior vitreous detachment of left eye  H43.812     1.  2.  3.  Ophthalmic Meds Ordered this visit:  Meds ordered this encounter  Medications  . Bevacizumab (AVASTIN) SOLN 1.25 mg       No follow-ups on file.  There are no Patient Instructions on file for this visit.   Explained the diagnoses, plan, and follow up with the patient and they expressed understanding.  Patient expressed understanding of the importance of proper follow up care.   Clent Demark Kadence Mimbs M.D. Diseases & Surgery of the Retina and Vitreous Retina [facility] Diabetic [place] 10/23/19     Abbreviations: M myopia (nearsighted); A astigmatism; H hyperopia (farsighted); P presbyopia; Mrx spectacle prescription;  CTL contact lenses; OD right eye; OS left eye; OU both eyes  XT exotropia; ET esotropia; PEK punctate epithelial keratitis; PEE punctate epithelial erosions; DES dry eye syndrome; MGD meibomian gland dysfunction; ATs artificial tears; PFAT's preservative free artificial tears; [place] nuclear sclerotic cataract; PSC posterior subcapsular cataract; ERM epi-retinal membrane; PVD posterior vitreous detachment; RD retinal detachment; DM diabetes mellitus; DR diabetic retinopathy; NPDR non-proliferative diabetic retinopathy; PDR  proliferative diabetic retinopathy; CSME clinically significant macular edema; DME diabetic macular edema; dbh dot blot hemorrhages; CWS cotton wool spot; POAG primary open angle glaucoma; C/D cup-to-disc ratio; HVF humphrey visual field; GVF goldmann visual field; OCT optical coherence tomography; IOP intraocular pressure; BRVO Branch retinal vein occlusion; CRVO central retinal vein occlusion; CRAO central retinal artery occlusion; BRAO branch retinal artery occlusion; RT retinal tear; SB scleral buckle; PPV pars plana vitrectomy; VH Vitreous hemorrhage; PRP panretinal laser photocoagulation; IVK intravitreal kenalog; VMT vitreomacular traction; MH Macular hole;  NVD neovascularization of the disc; NVE neovascularization elsewhere; AREDS age related eye disease study; ARMD age related macular degeneration; POAG primary open angle glaucoma; EBMD epithelial/anterior basement membrane dystrophy; ACIOL anterior chamber intraocular lens; IOL intraocular lens; PCIOL posterior chamber intraocular lens; Phaco/IOL phacoemulsification with intraocular lens placement; [place] photorefractive keratectomy; LASIK laser assisted in situ keratomileusis; HTN hypertension; DM diabetes mellitus; COPD chronic obstructive pulmonary disease

## 2019-11-03 ENCOUNTER — Other Ambulatory Visit: Payer: Self-pay

## 2019-11-03 ENCOUNTER — Encounter (INDEPENDENT_AMBULATORY_CARE_PROVIDER_SITE_OTHER): Payer: Self-pay | Admitting: Ophthalmology

## 2019-11-03 ENCOUNTER — Ambulatory Visit (INDEPENDENT_AMBULATORY_CARE_PROVIDER_SITE_OTHER): Payer: PPO | Admitting: Ophthalmology

## 2019-11-03 DIAGNOSIS — H353221 Exudative age-related macular degeneration, left eye, with active choroidal neovascularization: Secondary | ICD-10-CM

## 2019-11-03 DIAGNOSIS — H4311 Vitreous hemorrhage, right eye: Secondary | ICD-10-CM

## 2019-11-03 DIAGNOSIS — H353211 Exudative age-related macular degeneration, right eye, with active choroidal neovascularization: Secondary | ICD-10-CM

## 2019-11-03 DIAGNOSIS — H353114 Nonexudative age-related macular degeneration, right eye, advanced atrophic with subfoveal involvement: Secondary | ICD-10-CM | POA: Insufficient documentation

## 2019-11-03 MED ORDER — BEVACIZUMAB CHEMO INJECTION 1.25MG/0.05ML SYRINGE FOR KALEIDOSCOPE
1.2500 mg | INTRAVITREAL | Status: AC | PRN
  Administered 2019-11-03: 1.25 mg via INTRAVITREAL

## 2019-11-03 NOTE — Assessment & Plan Note (Signed)
OD, now at nearly 3 months after most recent injection.,  Will need to repeat Avastin today, due to new subretinal fluid present nasal to the pigment epithelial detachment subfoveal.

## 2019-11-03 NOTE — Progress Notes (Signed)
11/03/2019     CHIEF COMPLAINT Patient presents for Retina Follow Up   HISTORY OF PRESENT ILLNESS: [name] is a 86 y.o. female who presents to the clinic today for:   HPI    Retina Follow Up    Patient presents with  Wet AMD.  In right eye.  Severity is moderate.  Since onset it is stable.  I, the attending physician,  performed the HPI with the patient and updated documentation appropriately.          Comments    3 month AMD f\u OD. Possible Avastin OD. OCT  Pt states vision is stable. Denies any changes or complaints.       Last edited by Tilda Franco on 11/03/2019  8:46 AM. (History)      Referring physician: Asencion Noble, MD [address] [place],  [place] [address]  HISTORICAL INFORMATION:   Selected notes from the MEDICAL RECORD NUMBER       CURRENT MEDICATIONS: Current Outpatient Medications (Ophthalmic Drugs)  Medication Sig  . dorzolamide-timolol (COSOPT) 22.3-6.8 MG/ML ophthalmic solution Place 1 drop into the right eye 2 (two) times daily.   . Latanoprostene Bunod (VYZULTA) 0.024 % SOLN Place 1 drop into the right eye at bedtime.    No current facility-administered medications for this visit. (Ophthalmic Drugs)   Current Outpatient Medications (Other)  Medication Sig  . carvedilol (COREG) 12.5 MG tablet Take 12.5 mg by mouth 2 (two) times daily with a meal.   . ELIQUIS 5 MG TABS tablet TAKE (1) TABLET BY MOUTH TWICE DAILY FOR BLOOD THINNER.  . furosemide (LASIX) 40 MG tablet Take 40 mg by mouth 3 (three) times a week. & one by mouth as needed on other days  . losartan (COZAAR) 25 MG tablet Take 1 tablet (25 mg total) by mouth 2 (two) times daily.  . Multiple Vitamins-Minerals (VISION FORMULA EYE HEALTH PO) Take 1 tablet by mouth 2 (two) times daily.   Marland Kitchen OVER THE COUNTER MEDICATION Take 1 capsule by mouth daily. Vision Health eye supplement  . pantoprazole (PROTONIX) 40 MG tablet Take 1 tablet (40 mg total) by mouth daily.   No current  facility-administered medications for this visit. (Other)      REVIEW OF SYSTEMS:    ALLERGIES Allergies  Allergen Reactions  . Atorvastatin Other (See Comments)    Myalgias  . Hydrocodone Other (See Comments)    GI distress.    PAST MEDICAL HISTORY Past Medical History:  Diagnosis Date  . Anxiety   . Arteriosclerotic cardiovascular disease (ASCVD)     cath Feb '07- no obstructive dz - 20% proximal LAD only; EF 65%; possible coronary artery spasm  . Atrial fibrillation (HCC)    Paroxysmal on event recorder in 2009; regular supraventricular tachycardia at a rate of 150 also identified on that study representing either atrial flutter or PSVT; onset of persistent AF in 03/2011  . Carcinoma of breast ([place]) 2001   Left mastectomy with positive nodes  . Chest pain    Long-standing and atypical  . Chronic anticoagulation 04/12/2011  . GERD (gastroesophageal reflux disease)    hiatal hernia  . Herpes zoster   . Hyperlipidemia    Lipid profile in 11/2008:282, 176, 64, 201  . Hypertension    diastolic dysfunction; normal CMet and TSH in 11/2009; normal CBC in 04/2010  . Malignant neoplasm of breast (female), unspecified site 05/27/2013   right breast lumpectomy with positive node  . Peripheral neuropathy   .  Polio 1946   at age 26  . Rectal bleed   . Renal insufficiency   . Shortness of breath dyspnea   . Vitreous hemorrhage of right eye ([place]) 10/23/2019   Past Surgical History:  Procedure Laterality Date  . BREAST LUMPECTOMY WITH NEEDLE LOCALIZATION AND AXILLARY SENTINEL LYMPH NODE BX Right 05/27/2013   Procedure: BREAST LUMPECTOMY WITH NEEDLE LOCALIZATION AND AXILLARY SENTINEL LYMPH NODE BX;  Surgeon: Pedro Earls, MD;  Location: [facility];  Service: General;  Laterality: Right;  . BREAST SURGERY    . CHOLECYSTECTOMY  1999  . COLONOSCOPY  02/22/2011   Procedure: COLONOSCOPY;  Surgeon: Rogene Houston, MD;  [facility];  Service: Endoscopy;   Laterality: N/A;; performed for scant hematochezia  . DILATION AND CURETTAGE OF UTERUS    . ESOPHAGOGASTRODUODENOSCOPY  02/22/2011   Procedure: ESOPHAGOGASTRODUODENOSCOPY (EGD);  Surgeon: Rogene Houston, MD;  [facility];  Service: Endoscopy;  Laterality: N/A;  . ESOPHAGOGASTRODUODENOSCOPY N/A 06/02/2014   Procedure: ESOPHAGOGASTRODUODENOSCOPY (EGD);  Surgeon: Rogene Houston, MD;  [facility];  Service: Endoscopy;  Laterality: N/A;  730  . EYE SURGERY     both cataracts  . MALONEY DILATION N/A 06/02/2014   Procedure: Venia Minks DILATION;  Surgeon: Rogene Houston, MD;  [facility];  Service: Endoscopy;  Laterality: N/A;  . MASTECTOMY  2001   Left;modified radical with lymph node dissection  . PORT-A-CATH REMOVAL     pac in and out 2002    FAMILY HISTORY Family History  Problem Relation Age of Onset  . Cancer Sister        lung  . Cancer Brother        lipoma  . Cancer Brother        kidney    SOCIAL HISTORY Social History   Tobacco Use  . Smoking status: Never Smoker  . Smokeless tobacco: Never Used  Substance Use Topics  . Alcohol use: No    Alcohol/week: 0.0 standard drinks  . Drug use: No         OPHTHALMIC EXAM:  Base Eye Exam    Visual Acuity (Snellen - Linear)      Right Left   Dist [place] 20/50 -2 20/60 -2   Dist ph [place] 20/50 -2       Tonometry (Tonopen, 8:51 AM)      Right Left   Pressure 16 14       Pupils      Pupils Dark Light Shape React APD   Right PERRL 5 4 Round Minimal None   Left PERRL 5 3 Round Brisk None       Visual Fields (Counting fingers)      Left Right    Full Full       Neuro/Psych    Oriented x3: Yes   Mood/Affect: Normal       Dilation    Right eye: 1.0% Mydriacyl, 2.5% Phenylephrine @ 8:51 AM        Slit Lamp and Fundus Exam    External Exam      Right Left   External Normal Normal       Slit Lamp Exam      Right Left   Lids/Lashes Normal Normal   Conjunctiva/Sclera White  and quiet White and quiet   Cornea Clear Clear   Anterior Chamber Deep and quiet Deep and quiet   Iris Round and reactive Round and reactive  Lens Posterior chamber intraocular lens Posterior chamber intraocular lens   Anterior Vitreous Normal Normal       Fundus Exam      Right Left   Posterior Vitreous Vitrectomized    Disc Normal    C/D Ratio 0.45    Macula Retinal pigment epithelial mottling, Retinal pigment epithelial detachment, Macular thickening, Retinal pigment epithelial atrophy    Vessels Normal    Periphery Normal           IMAGING AND PROCEDURES  Imaging and Procedures for 11/03/19  OCT, Retina - OU - Both Eyes       Right Eye Quality was good. Scan locations included subfoveal. Central Foveal Thickness: 375. Progression has worsened. Findings include subretinal fluid, subretinal scarring, outer retinal atrophy, pigment epithelial detachment.   Left Eye Quality was good. Progression has improved.   Notes OD, now at nearly 3 months after most recent injection.,  Will need to repeat Avastin today, due to new subretinal fluid present nasal to the pigment epithelial detachment subfoveal.       Intravitreal Injection, Pharmacologic Agent - OD - Right Eye       Time Out 11/03/2019. 9:37 AM. Confirmed correct patient, procedure, site, and patient consented.   Anesthesia Topical anesthesia was used. Anesthetic medications included Akten 3.5%.   Procedure Preparation included Tobramycin 0.3%, 10% betadine to eyelids. A 30 gauge needle was used.   Injection:  1.25 mg Bevacizumab (AVASTIN) SOLN   NDC: [id number], Lot: [id number]   Route: Intravitreal, Site: Right Eye, Waste: 0 mg  Post-op Post injection exam found visual acuity of at least counting fingers. The patient tolerated the procedure well. There were no complications. The patient received written and verbal post procedure care education. Post injection medications were not given.                  ASSESSMENT/PLAN:  Advanced nonexudative age-related macular degeneration of right eye with subfoveal involvement OD, now at nearly 3 months after most recent injection.,  Will need to repeat Avastin today, due to new subretinal fluid present nasal to the pigment epithelial detachment subfoveal.      ICD-10-CM   1. Exudative age-related macular degeneration of right eye with active choroidal neovascularization (HCC)  H35.3211 OCT, Retina - OU - Both Eyes    Intravitreal Injection, Pharmacologic Agent - OD - Right Eye    Bevacizumab (AVASTIN) SOLN 1.25 mg  2. Advanced nonexudative age-related macular degeneration of right eye with subfoveal involvement  H35.3114 OCT, Retina - OU - Both Eyes  3. Vitreous hemorrhage of right eye ([place])  H43.11 OCT, Retina - OU - Both Eyes  4. Exudative age-related macular degeneration of left eye with active choroidal neovascularization (HCC)  H35.3221 OCT, Retina - OU - Both Eyes    1.OD, now at nearly 3 months after most recent injection.,  Will need to repeat Avastin today, due to new subretinal fluid present nasal to the pigment epithelial detachment subfoveal.  OD, follow-up exam in 8 weeks  2.  OS, will need follow-up examination as scheduled.  3.  Ophthalmic Meds Ordered this visit:  Meds ordered this encounter  Medications  . Bevacizumab (AVASTIN) SOLN 1.25 mg       No follow-ups on file.  There are no Patient Instructions on file for this visit.   Explained the diagnoses, plan, and follow up with the patient and they expressed understanding.  Patient expressed understanding of the importance of proper follow up care.  Clent Demark Dandria Griego M.D. Diseases & Surgery of the Retina and Vitreous Retina [facility] Diabetic [place] 11/03/19     Abbreviations: M myopia (nearsighted); A astigmatism; H hyperopia (farsighted); P presbyopia; Mrx spectacle prescription;  CTL contact lenses; OD right eye; OS left eye; OU both eyes  XT exotropia; ET  esotropia; PEK punctate epithelial keratitis; PEE punctate epithelial erosions; DES dry eye syndrome; MGD meibomian gland dysfunction; ATs artificial tears; PFAT's preservative free artificial tears; [place] nuclear sclerotic cataract; PSC posterior subcapsular cataract; ERM epi-retinal membrane; PVD posterior vitreous detachment; RD retinal detachment; DM diabetes mellitus; DR diabetic retinopathy; NPDR non-proliferative diabetic retinopathy; PDR proliferative diabetic retinopathy; CSME clinically significant macular edema; DME diabetic macular edema; dbh dot blot hemorrhages; CWS cotton wool spot; POAG primary open angle glaucoma; C/D cup-to-disc ratio; HVF humphrey visual field; GVF goldmann visual field; OCT optical coherence tomography; IOP intraocular pressure; BRVO Branch retinal vein occlusion; CRVO central retinal vein occlusion; CRAO central retinal artery occlusion; BRAO branch retinal artery occlusion; RT retinal tear; SB scleral buckle; PPV pars plana vitrectomy; VH Vitreous hemorrhage; PRP panretinal laser photocoagulation; IVK intravitreal kenalog; VMT vitreomacular traction; MH Macular hole;  NVD neovascularization of the disc; NVE neovascularization elsewhere; AREDS age related eye disease study; ARMD age related macular degeneration; POAG primary open angle glaucoma; EBMD epithelial/anterior basement membrane dystrophy; ACIOL anterior chamber intraocular lens; IOL intraocular lens; PCIOL posterior chamber intraocular lens; Phaco/IOL phacoemulsification with intraocular lens placement; [place] photorefractive keratectomy; LASIK laser assisted in situ keratomileusis; HTN hypertension; DM diabetes mellitus; COPD chronic obstructive pulmonary disease

## 2019-11-06 DIAGNOSIS — L820 Inflamed seborrheic keratosis: Secondary | ICD-10-CM | POA: Diagnosis not present

## 2019-11-06 DIAGNOSIS — X32XXXD Exposure to sunlight, subsequent encounter: Secondary | ICD-10-CM | POA: Diagnosis not present

## 2019-11-06 DIAGNOSIS — L570 Actinic keratosis: Secondary | ICD-10-CM | POA: Diagnosis not present

## 2019-12-04 ENCOUNTER — Ambulatory Visit (INDEPENDENT_AMBULATORY_CARE_PROVIDER_SITE_OTHER): Payer: PPO | Admitting: Ophthalmology

## 2019-12-04 ENCOUNTER — Encounter (INDEPENDENT_AMBULATORY_CARE_PROVIDER_SITE_OTHER): Payer: Self-pay | Admitting: Ophthalmology

## 2019-12-04 ENCOUNTER — Other Ambulatory Visit: Payer: Self-pay

## 2019-12-04 DIAGNOSIS — H4311 Vitreous hemorrhage, right eye: Secondary | ICD-10-CM

## 2019-12-04 DIAGNOSIS — H353221 Exudative age-related macular degeneration, left eye, with active choroidal neovascularization: Secondary | ICD-10-CM | POA: Diagnosis not present

## 2019-12-04 DIAGNOSIS — H353114 Nonexudative age-related macular degeneration, right eye, advanced atrophic with subfoveal involvement: Secondary | ICD-10-CM | POA: Diagnosis not present

## 2019-12-04 MED ORDER — BEVACIZUMAB CHEMO INJECTION 1.25MG/0.05ML SYRINGE FOR KALEIDOSCOPE
1.2500 mg | INTRAVITREAL | Status: AC | PRN
  Administered 2019-12-04: 1.25 mg via INTRAVITREAL

## 2019-12-04 NOTE — Assessment & Plan Note (Addendum)
The nature of wet macular degeneration was discussed with the patient.  Forms of therapy reviewed include the use of Anti-VEGF medications injected painlessly into the eye, as well as other possible treatment modalities, including thermal laser therapy. Fellow eye involvement and risks were discussed with the patient. Upon the finding of wet age related macular degeneration, treatment will be offered. The treatment regimen is on a treat as needed basis with the intent to treat if necessary and extend interval of exams when possible. On average 1 out of 6 patients do not need lifetime therapy. However, the risk of recurrent disease is high for a lifetime.  Initially monthly, then periodic, examinations and evaluations will determine whether the next treatment is required on the day of the examination.  OS, much improved intraretinal fluid temporal to the fovea now 6 weeks status post intravitreal Avastin.    Repeat intravitreal Avastin OS today.

## 2019-12-04 NOTE — Assessment & Plan Note (Signed)
All this condition is stable yet slowly progressive

## 2019-12-04 NOTE — Progress Notes (Signed)
12/04/2019     CHIEF COMPLAINT Patient presents for Retina Follow Up   HISTORY OF PRESENT ILLNESS: [name] is a 86 y.o. female who presents to the clinic today for:   HPI    Retina Follow Up    Patient presents with  Wet AMD.  In left eye.  Duration of 6 weeks.  Since onset it is stable.          Comments    6 week follow up - OCT OU, Possible Avastin OS Patient denies change in vision and overall has no complaints.        Last edited by Gerda Diss on 12/04/2019  9:21 AM. (History)      Referring physician: Asencion Noble, MD [address] [place],  [place] [address]  HISTORICAL INFORMATION:   Selected notes from the MEDICAL RECORD NUMBER       CURRENT MEDICATIONS: Current Outpatient Medications (Ophthalmic Drugs)  Medication Sig  . dorzolamide-timolol (COSOPT) 22.3-6.8 MG/ML ophthalmic solution Place 1 drop into the right eye 2 (two) times daily.   . Latanoprostene Bunod (VYZULTA) 0.024 % SOLN Place 1 drop into the right eye at bedtime.    No current facility-administered medications for this visit. (Ophthalmic Drugs)   Current Outpatient Medications (Other)  Medication Sig  . carvedilol (COREG) 12.5 MG tablet Take 12.5 mg by mouth 2 (two) times daily with a meal.   . ELIQUIS 5 MG TABS tablet TAKE (1) TABLET BY MOUTH TWICE DAILY FOR BLOOD THINNER.  . furosemide (LASIX) 40 MG tablet Take 40 mg by mouth 3 (three) times a week. & one by mouth as needed on other days  . losartan (COZAAR) 25 MG tablet Take 1 tablet (25 mg total) by mouth 2 (two) times daily.  . Multiple Vitamins-Minerals (VISION FORMULA EYE HEALTH PO) Take 1 tablet by mouth 2 (two) times daily.   Marland Kitchen OVER THE COUNTER MEDICATION Take 1 capsule by mouth daily. Vision Health eye supplement  . pantoprazole (PROTONIX) 40 MG tablet Take 1 tablet (40 mg total) by mouth daily.   No current facility-administered medications for this visit. (Other)      REVIEW OF  SYSTEMS:    ALLERGIES Allergies  Allergen Reactions  . Atorvastatin Other (See Comments)    Myalgias  . Hydrocodone Other (See Comments)    GI distress.    PAST MEDICAL HISTORY Past Medical History:  Diagnosis Date  . Anxiety   . Arteriosclerotic cardiovascular disease (ASCVD)     cath Feb '07- no obstructive dz - 20% proximal LAD only; EF 65%; possible coronary artery spasm  . Atrial fibrillation (HCC)    Paroxysmal on event recorder in 2009; regular supraventricular tachycardia at a rate of 150 also identified on that study representing either atrial flutter or PSVT; onset of persistent AF in 03/2011  . Carcinoma of breast ([place]) 2001   Left mastectomy with positive nodes  . Chest pain    Long-standing and atypical  . Chronic anticoagulation 04/12/2011  . GERD (gastroesophageal reflux disease)    hiatal hernia  . Herpes zoster   . Hyperlipidemia    Lipid profile in 11/2008:282, 176, 64, 201  . Hypertension    diastolic dysfunction; normal CMet and TSH in 11/2009; normal CBC in 04/2010  . Malignant neoplasm of breast (female), unspecified site 05/27/2013   right breast lumpectomy with positive node  . Peripheral neuropathy   . Polio 1946   at age 89  . Rectal bleed   .  Renal insufficiency   . Shortness of breath dyspnea   . Vitreous hemorrhage of right eye ([place]) 10/23/2019   Past Surgical History:  Procedure Laterality Date  . BREAST LUMPECTOMY WITH NEEDLE LOCALIZATION AND AXILLARY SENTINEL LYMPH NODE BX Right 05/27/2013   Procedure: BREAST LUMPECTOMY WITH NEEDLE LOCALIZATION AND AXILLARY SENTINEL LYMPH NODE BX;  Surgeon: Pedro Earls, MD;  Location: [facility];  Service: General;  Laterality: Right;  . BREAST SURGERY    . CHOLECYSTECTOMY  1999  . COLONOSCOPY  02/22/2011   Procedure: COLONOSCOPY;  Surgeon: Rogene Houston, MD;  [facility];  Service: Endoscopy;  Laterality: N/A;; performed for scant hematochezia  . DILATION AND CURETTAGE OF  UTERUS    . ESOPHAGOGASTRODUODENOSCOPY  02/22/2011   Procedure: ESOPHAGOGASTRODUODENOSCOPY (EGD);  Surgeon: Rogene Houston, MD;  [facility];  Service: Endoscopy;  Laterality: N/A;  . ESOPHAGOGASTRODUODENOSCOPY N/A 06/02/2014   Procedure: ESOPHAGOGASTRODUODENOSCOPY (EGD);  Surgeon: Rogene Houston, MD;  [facility];  Service: Endoscopy;  Laterality: N/A;  730  . EYE SURGERY     both cataracts  . MALONEY DILATION N/A 06/02/2014   Procedure: Venia Minks DILATION;  Surgeon: Rogene Houston, MD;  [facility];  Service: Endoscopy;  Laterality: N/A;  . MASTECTOMY  2001   Left;modified radical with lymph node dissection  . PORT-A-CATH REMOVAL     pac in and out 2002    FAMILY HISTORY Family History  Problem Relation Age of Onset  . Cancer Sister        lung  . Cancer Brother        lipoma  . Cancer Brother        kidney    SOCIAL HISTORY Social History   Tobacco Use  . Smoking status: Never Smoker  . Smokeless tobacco: Never Used  Substance Use Topics  . Alcohol use: No    Alcohol/week: 0.0 standard drinks  . Drug use: No         OPHTHALMIC EXAM: Base Eye Exam    Visual Acuity (Snellen - Linear)      Right Left   Dist [place] 20/50+1 20/60-2   Dist ph [place] NI 20/50       Tonometry (Tonopen, 9:26 AM)      Right Left   Pressure 17 14       Pupils      Pupils Dark Light Shape React APD   Right PERRL 5 4 Round Minimal None   Left PERRL 5 3 Round Brisk None       Visual Fields (Counting fingers)      Left Right    Full Full       Extraocular Movement      Right Left    Full Full       Neuro/Psych    Oriented x3: Yes   Mood/Affect: Normal       Dilation    Left eye: 1.0% Mydriacyl, 2.5% Phenylephrine @ 9:26 AM        Slit Lamp and Fundus Exam    External Exam      Right Left   External Normal Normal       Slit Lamp Exam      Right Left   Lids/Lashes Normal Normal   Conjunctiva/Sclera White and quiet White and quiet    Cornea Clear Clear   Anterior Chamber Deep and quiet Deep and quiet   Iris Round and reactive Round and reactive  Lens Posterior chamber intraocular lens Posterior chamber intraocular lens   Anterior Vitreous Normal Normal       Fundus Exam      Right Left   Posterior Vitreous clear , vitrectomized Posterior vitreous detachment   Disc  Normal   C/D Ratio  0.4   Macula  Disciform scar, [place], no exudates, no hemorrhage, no macular thickening, Mottling, Retinal pigment epithelial mottling, Atrophy, Retinal pigment epithelial atrophy, Age related macular degeneration, Early age related macular degeneration   Vessels  Normal   Periphery  Normal          IMAGING AND PROCEDURES  Imaging and Procedures for 12/04/19  OCT, Retina - OU - Both Eyes       Right Eye Quality was good. Scan locations included subfoveal.   Left Eye Quality was good. Scan locations included subfoveal.                 ASSESSMENT/PLAN:  Exudative age-related macular degeneration of left eye with active choroidal neovascularization (HCC) The nature of wet macular degeneration was discussed with the patient.  Forms of therapy reviewed include the use of Anti-VEGF medications injected painlessly into the eye, as well as other possible treatment modalities, including thermal laser therapy. Fellow eye involvement and risks were discussed with the patient. Upon the finding of wet age related macular degeneration, treatment will be offered. The treatment regimen is on a treat as needed basis with the intent to treat if necessary and extend interval of exams when possible. On average 1 out of 6 patients do not need lifetime therapy. However, the risk of recurrent disease is high for a lifetime.  Initially monthly, then periodic, examinations and evaluations will determine whether the next treatment is required on the day of the examination.  OS, much improved intraretinal fluid temporal to the fovea now 6  weeks status post intravitreal Avastin.    Repeat intravitreal Avastin OS today.      ICD-10-CM   1. Exudative age-related macular degeneration of left eye with active choroidal neovascularization (HCC)  H35.3221 Intravitreal Injection, Pharmacologic Agent - OS - Left Eye    1.  2.  3.  Ophthalmic Meds Ordered this visit:  No orders of the defined types were placed in this encounter.      No follow-ups on file.  There are no Patient Instructions on file for this visit.   Explained the diagnoses, plan, and follow up with the patient and they expressed understanding.  Patient expressed understanding of the importance of proper follow up care.   Clent Demark Cellie Dardis M.D. Diseases & Surgery of the Retina and Vitreous Retina & Diabetic [place] 12/04/19     Abbreviations: M myopia (nearsighted); A astigmatism; H hyperopia (farsighted); P presbyopia; Mrx spectacle prescription;  CTL contact lenses; OD right eye; OS left eye; OU both eyes  XT exotropia; ET esotropia; PEK punctate epithelial keratitis; PEE punctate epithelial erosions; DES dry eye syndrome; MGD meibomian gland dysfunction; ATs artificial tears; PFAT's preservative free artificial tears; [place] nuclear sclerotic cataract; PSC posterior subcapsular cataract; ERM epi-retinal membrane; PVD posterior vitreous detachment; RD retinal detachment; DM diabetes mellitus; DR diabetic retinopathy; NPDR non-proliferative diabetic retinopathy; PDR proliferative diabetic retinopathy; CSME clinically significant macular edema; DME diabetic macular edema; dbh dot blot hemorrhages; CWS cotton wool spot; POAG primary open angle glaucoma; C/D cup-to-disc ratio; HVF humphrey visual field; GVF goldmann visual field; OCT optical coherence tomography; IOP intraocular pressure; BRVO Branch retinal vein occlusion; CRVO central retinal vein occlusion;  CRAO central retinal artery occlusion; BRAO branch retinal artery occlusion; RT retinal tear; SB scleral  buckle; PPV pars plana vitrectomy; VH Vitreous hemorrhage; PRP panretinal laser photocoagulation; IVK intravitreal kenalog; VMT vitreomacular traction; MH Macular hole;  NVD neovascularization of the disc; NVE neovascularization elsewhere; AREDS age related eye disease study; ARMD age related macular degeneration; POAG primary open angle glaucoma; EBMD epithelial/anterior basement membrane dystrophy; ACIOL anterior chamber intraocular lens; IOL intraocular lens; PCIOL posterior chamber intraocular lens; Phaco/IOL phacoemulsification with intraocular lens placement; [place] photorefractive keratectomy; LASIK laser assisted in situ keratomileusis; HTN hypertension; DM diabetes mellitus; COPD chronic obstructive pulmonary disease

## 2019-12-29 ENCOUNTER — Encounter (INDEPENDENT_AMBULATORY_CARE_PROVIDER_SITE_OTHER): Payer: PPO | Admitting: Ophthalmology

## 2019-12-30 ENCOUNTER — Encounter (INDEPENDENT_AMBULATORY_CARE_PROVIDER_SITE_OTHER): Payer: PPO | Admitting: Ophthalmology

## 2020-01-01 DIAGNOSIS — I482 Chronic atrial fibrillation, unspecified: Secondary | ICD-10-CM | POA: Diagnosis not present

## 2020-01-01 DIAGNOSIS — I10 Essential (primary) hypertension: Secondary | ICD-10-CM | POA: Diagnosis not present

## 2020-01-01 DIAGNOSIS — R748 Abnormal levels of other serum enzymes: Secondary | ICD-10-CM | POA: Diagnosis not present

## 2020-01-01 DIAGNOSIS — Z79899 Other long term (current) drug therapy: Secondary | ICD-10-CM | POA: Diagnosis not present

## 2020-01-01 DIAGNOSIS — I5032 Chronic diastolic (congestive) heart failure: Secondary | ICD-10-CM | POA: Diagnosis not present

## 2020-01-06 ENCOUNTER — Encounter (INDEPENDENT_AMBULATORY_CARE_PROVIDER_SITE_OTHER): Payer: Self-pay | Admitting: Ophthalmology

## 2020-01-06 ENCOUNTER — Ambulatory Visit (INDEPENDENT_AMBULATORY_CARE_PROVIDER_SITE_OTHER): Payer: PPO | Admitting: Ophthalmology

## 2020-01-06 ENCOUNTER — Other Ambulatory Visit: Payer: Self-pay

## 2020-01-06 DIAGNOSIS — H353211 Exudative age-related macular degeneration, right eye, with active choroidal neovascularization: Secondary | ICD-10-CM

## 2020-01-06 DIAGNOSIS — H353114 Nonexudative age-related macular degeneration, right eye, advanced atrophic with subfoveal involvement: Secondary | ICD-10-CM | POA: Diagnosis not present

## 2020-01-06 DIAGNOSIS — H353221 Exudative age-related macular degeneration, left eye, with active choroidal neovascularization: Secondary | ICD-10-CM

## 2020-01-06 MED ORDER — BEVACIZUMAB CHEMO INJECTION 1.25MG/0.05ML SYRINGE FOR KALEIDOSCOPE
1.2500 mg | INTRAVITREAL | Status: AC | PRN
  Administered 2020-01-06: 1.25 mg via INTRAVITREAL

## 2020-01-06 NOTE — Progress Notes (Signed)
01/06/2020     CHIEF COMPLAINT Patient presents for Retina Follow Up   HISTORY OF PRESENT ILLNESS: [name] is a 86 y.o. female who presents to the clinic today for:   HPI    Retina Follow Up    Patient presents with  Wet AMD.  In right eye.  Severity is moderate.  Duration of 9 weeks.  Since onset it is stable.  I, the attending physician,  performed the HPI with the patient and updated documentation appropriately.          Comments    9 Week AMD f\u OD. Possible Avastin OD. OCT  Pt states vision is stable. Denies any complaints       Last edited by Tilda Franco on 01/06/2020  8:27 AM. (History)      Referring physician: Asencion Noble, MD [address] [place],  [place] [address]  HISTORICAL INFORMATION:   Selected notes from the MEDICAL RECORD NUMBER       CURRENT MEDICATIONS: Current Outpatient Medications (Ophthalmic Drugs)  Medication Sig  . dorzolamide-timolol (COSOPT) 22.3-6.8 MG/ML ophthalmic solution Place 1 drop into the right eye 2 (two) times daily.   . Latanoprostene Bunod (VYZULTA) 0.024 % SOLN Place 1 drop into the right eye at bedtime.    No current facility-administered medications for this visit. (Ophthalmic Drugs)   Current Outpatient Medications (Other)  Medication Sig  . carvedilol (COREG) 12.5 MG tablet Take 12.5 mg by mouth 2 (two) times daily with a meal.   . ELIQUIS 5 MG TABS tablet TAKE (1) TABLET BY MOUTH TWICE DAILY FOR BLOOD THINNER.  . furosemide (LASIX) 40 MG tablet Take 40 mg by mouth 3 (three) times a week. & one by mouth as needed on other days  . losartan (COZAAR) 25 MG tablet Take 1 tablet (25 mg total) by mouth 2 (two) times daily.  . Multiple Vitamins-Minerals (VISION FORMULA EYE HEALTH PO) Take 1 tablet by mouth 2 (two) times daily.   Marland Kitchen OVER THE COUNTER MEDICATION Take 1 capsule by mouth daily. Vision Health eye supplement  . pantoprazole (PROTONIX) 40 MG tablet Take 1 tablet (40 mg total) by mouth daily.   No  current facility-administered medications for this visit. (Other)      REVIEW OF SYSTEMS:    ALLERGIES Allergies  Allergen Reactions  . Atorvastatin Other (See Comments)    Myalgias  . Hydrocodone Other (See Comments)    GI distress.    PAST MEDICAL HISTORY Past Medical History:  Diagnosis Date  . Anxiety   . Arteriosclerotic cardiovascular disease (ASCVD)     cath Feb '07- no obstructive dz - 20% proximal LAD only; EF 65%; possible coronary artery spasm  . Atrial fibrillation (HCC)    Paroxysmal on event recorder in 2009; regular supraventricular tachycardia at a rate of 150 also identified on that study representing either atrial flutter or PSVT; onset of persistent AF in 03/2011  . Carcinoma of breast ([place]) 2001   Left mastectomy with positive nodes  . Chest pain    Long-standing and atypical  . Chronic anticoagulation 04/12/2011  . GERD (gastroesophageal reflux disease)    hiatal hernia  . Herpes zoster   . Hyperlipidemia    Lipid profile in 11/2008:282, 176, 64, 201  . Hypertension    diastolic dysfunction; normal CMet and TSH in 11/2009; normal CBC in 04/2010  . Malignant neoplasm of breast (female), unspecified site 05/27/2013   right breast lumpectomy with positive node  .  Peripheral neuropathy   . Polio 1946   at age 53  . Rectal bleed   . Renal insufficiency   . Shortness of breath dyspnea   . Vitreous hemorrhage of right eye ([place]) 10/23/2019   Past Surgical History:  Procedure Laterality Date  . BREAST LUMPECTOMY WITH NEEDLE LOCALIZATION AND AXILLARY SENTINEL LYMPH NODE BX Right 05/27/2013   Procedure: BREAST LUMPECTOMY WITH NEEDLE LOCALIZATION AND AXILLARY SENTINEL LYMPH NODE BX;  Surgeon: Pedro Earls, MD;  Location: [facility];  Service: General;  Laterality: Right;  . BREAST SURGERY    . CHOLECYSTECTOMY  1999  . COLONOSCOPY  02/22/2011   Procedure: COLONOSCOPY;  Surgeon: Rogene Houston, MD;  [facility];  Service:  Endoscopy;  Laterality: N/A;; performed for scant hematochezia  . DILATION AND CURETTAGE OF UTERUS    . ESOPHAGOGASTRODUODENOSCOPY  02/22/2011   Procedure: ESOPHAGOGASTRODUODENOSCOPY (EGD);  Surgeon: Rogene Houston, MD;  [facility];  Service: Endoscopy;  Laterality: N/A;  . ESOPHAGOGASTRODUODENOSCOPY N/A 06/02/2014   Procedure: ESOPHAGOGASTRODUODENOSCOPY (EGD);  Surgeon: Rogene Houston, MD;  [facility];  Service: Endoscopy;  Laterality: N/A;  730  . EYE SURGERY     both cataracts  . MALONEY DILATION N/A 06/02/2014   Procedure: Venia Minks DILATION;  Surgeon: Rogene Houston, MD;  [facility];  Service: Endoscopy;  Laterality: N/A;  . MASTECTOMY  2001   Left;modified radical with lymph node dissection  . PORT-A-CATH REMOVAL     pac in and out 2002    FAMILY HISTORY Family History  Problem Relation Age of Onset  . Cancer Sister        lung  . Cancer Brother        lipoma  . Cancer Brother        kidney    SOCIAL HISTORY Social History   Tobacco Use  . Smoking status: Never Smoker  . Smokeless tobacco: Never Used  Vaping Use  . Vaping Use: Never used  Substance Use Topics  . Alcohol use: No    Alcohol/week: 0.0 standard drinks  . Drug use: No         OPHTHALMIC EXAM:  Base Eye Exam    Visual Acuity (Snellen - Linear)      Right Left   Dist [place] 20/60 + 20/80   Dist ph [place] NI 20/60 -2       Tonometry (Tonopen, 8:33 AM)      Right Left   Pressure 13 13       Pupils      Pupils Dark Light Shape React APD   Right PERRL 4 3 Round Slow None   Left PERRL 4 3 Round Brisk None       Visual Fields (Counting fingers)      Left Right    Full Full       Neuro/Psych    Oriented x3: Yes   Mood/Affect: Normal       Dilation    Right eye: 1.0% Mydriacyl, 2.5% Phenylephrine @ 8:33 AM        Slit Lamp and Fundus Exam    External Exam      Right Left   External Normal Normal       Slit Lamp Exam      Right Left    Lids/Lashes Normal Normal   Conjunctiva/Sclera White and quiet White and quiet   Cornea Clear Clear   Anterior Chamber Deep and quiet Deep  and quiet   Iris Round and reactive Round and reactive   Lens Posterior chamber intraocular lens Posterior chamber intraocular lens   Anterior Vitreous Normal Normal       Fundus Exam      Right Left   Posterior Vitreous Vitrectomized    Disc Normal    C/D Ratio 0.45    Macula Retinal pigment epithelial mottling, Retinal pigment epithelial detachment, Macular thickening, Retinal pigment epithelial atrophy    Vessels Normal    Periphery Normal           IMAGING AND PROCEDURES  Imaging and Procedures for 01/06/20  OCT, Retina - OU - Both Eyes       Right Eye Quality was borderline. Scan locations included subfoveal. Progression has improved. Findings include subretinal scarring, choroidal neovascular membrane.   Left Eye Central Foveal Thickness: 274.   Notes OD with much less subretinal fluid today,, yet the thickening as determined by computer generated algorithms are inaccurate due to wandering baseline.  Currently OD at 9-week interval exam  OS also with much less subretinal fluid of disease activity.         Intravitreal Injection, Pharmacologic Agent - OD - Right Eye       Time Out 01/06/2020. 8:57 AM. Confirmed correct patient, procedure, site, and patient consented.   Anesthesia Topical anesthesia was used. Anesthetic medications included Akten 3.5%.   Procedure Preparation included Tobramycin 0.3%, 10% betadine to eyelids, 5% betadine to ocular surface. A 30 gauge needle was used.   Injection:  1.25 mg Bevacizumab (AVASTIN) SOLN   NDC: [address]-0, Lot: [id number]   Route: Intravitreal, Site: Right Eye, Waste: 0 mg  Post-op Post injection exam found visual acuity of at least counting fingers. The patient tolerated the procedure well. There were no complications. The patient received written and verbal post procedure  care education. Post injection medications were not given.                 ASSESSMENT/PLAN:  Exudative age-related macular degeneration of right eye with active choroidal neovascularization ([place]) OD with much less subretinal fluid today,, yet the thickening as determined by computer generated algorithms are inaccurate due to wandering baseline.  Currently OD at 9-week interval exam  Repeat Avastin intravitreal OD today and examination OD in 9 weeks  Exudative age-related macular degeneration of left eye with active choroidal neovascularization ([place]) Maintain  current follow-up as scheduled      ICD-10-CM   1. Exudative age-related macular degeneration of right eye with active choroidal neovascularization (HCC)  H35.3211 OCT, Retina - OU - Both Eyes    Intravitreal Injection, Pharmacologic Agent - OD - Right Eye    Bevacizumab (AVASTIN) SOLN 1.25 mg  2. Advanced nonexudative age-related macular degeneration of right eye with subfoveal involvement  H35.3114   3. Exudative age-related macular degeneration of left eye with active choroidal neovascularization ([place])  H35.3221     1.  2.  3.  Ophthalmic Meds Ordered this visit:  Meds ordered this encounter  Medications  . Bevacizumab (AVASTIN) SOLN 1.25 mg       Return in about 9 weeks (around 03/09/2020) for dilate, OD, AVASTIN OCT.  There are no Patient Instructions on file for this visit.   Explained the diagnoses, plan, and follow up with the patient and they expressed understanding.  Patient expressed understanding of the importance of proper follow up care.   Clent Demark Keva Darty M.D. Diseases & Surgery of the Retina and Vitreous Retina & Diabetic  [place] 01/06/20     Abbreviations: M myopia (nearsighted); A astigmatism; H hyperopia (farsighted); P presbyopia; Mrx spectacle prescription;  CTL contact lenses; OD right eye; OS left eye; OU both eyes  XT exotropia; ET esotropia; PEK punctate epithelial keratitis;  PEE punctate epithelial erosions; DES dry eye syndrome; MGD meibomian gland dysfunction; ATs artificial tears; PFAT's preservative free artificial tears; [place] nuclear sclerotic cataract; PSC posterior subcapsular cataract; ERM epi-retinal membrane; PVD posterior vitreous detachment; RD retinal detachment; DM diabetes mellitus; DR diabetic retinopathy; NPDR non-proliferative diabetic retinopathy; PDR proliferative diabetic retinopathy; CSME clinically significant macular edema; DME diabetic macular edema; dbh dot blot hemorrhages; CWS cotton wool spot; POAG primary open angle glaucoma; C/D cup-to-disc ratio; HVF humphrey visual field; GVF goldmann visual field; OCT optical coherence tomography; IOP intraocular pressure; BRVO Branch retinal vein occlusion; CRVO central retinal vein occlusion; CRAO central retinal artery occlusion; BRAO branch retinal artery occlusion; RT retinal tear; SB scleral buckle; PPV pars plana vitrectomy; VH Vitreous hemorrhage; PRP panretinal laser photocoagulation; IVK intravitreal kenalog; VMT vitreomacular traction; MH Macular hole;  NVD neovascularization of the disc; NVE neovascularization elsewhere; AREDS age related eye disease study; ARMD age related macular degeneration; POAG primary open angle glaucoma; EBMD epithelial/anterior basement membrane dystrophy; ACIOL anterior chamber intraocular lens; IOL intraocular lens; PCIOL posterior chamber intraocular lens; Phaco/IOL phacoemulsification with intraocular lens placement; [place] photorefractive keratectomy; LASIK laser assisted in situ keratomileusis; HTN hypertension; DM diabetes mellitus; COPD chronic obstructive pulmonary disease

## 2020-01-06 NOTE — Assessment & Plan Note (Signed)
OD with much less subretinal fluid today,, yet the thickening as determined by computer generated algorithms are inaccurate due to wandering baseline.  Currently OD at 9-week interval exam  Repeat Avastin intravitreal OD today and examination OD in 9 weeks

## 2020-01-06 NOTE — Assessment & Plan Note (Addendum)
Maintain  current follow-up as scheduled

## 2020-01-08 DIAGNOSIS — D692 Other nonthrombocytopenic purpura: Secondary | ICD-10-CM | POA: Diagnosis not present

## 2020-01-08 DIAGNOSIS — I482 Chronic atrial fibrillation, unspecified: Secondary | ICD-10-CM | POA: Diagnosis not present

## 2020-01-08 DIAGNOSIS — I5032 Chronic diastolic (congestive) heart failure: Secondary | ICD-10-CM | POA: Diagnosis not present

## 2020-01-15 ENCOUNTER — Other Ambulatory Visit: Payer: Self-pay

## 2020-01-15 ENCOUNTER — Ambulatory Visit (INDEPENDENT_AMBULATORY_CARE_PROVIDER_SITE_OTHER): Payer: PPO | Admitting: Ophthalmology

## 2020-01-15 ENCOUNTER — Encounter (INDEPENDENT_AMBULATORY_CARE_PROVIDER_SITE_OTHER): Payer: Self-pay | Admitting: Ophthalmology

## 2020-01-15 DIAGNOSIS — H353221 Exudative age-related macular degeneration, left eye, with active choroidal neovascularization: Secondary | ICD-10-CM | POA: Diagnosis not present

## 2020-01-15 MED ORDER — BEVACIZUMAB CHEMO INJECTION 1.25MG/0.05ML SYRINGE FOR KALEIDOSCOPE
1.2500 mg | INTRAVITREAL | Status: AC | PRN
  Administered 2020-01-15: 1.25 mg via INTRAVITREAL

## 2020-01-15 NOTE — Assessment & Plan Note (Signed)
OS, improved anatomy overall from treatment of wet AMD.  Currently at 6-week exam interval post intravitreal Avastin.  Repeat injection OS today and examination in 7 weeks.

## 2020-01-15 NOTE — Progress Notes (Signed)
01/15/2020     CHIEF COMPLAINT Patient presents for Retina Follow Up   HISTORY OF PRESENT ILLNESS: [name] is a 86 y.o. female who presents to the clinic today for:   HPI    Retina Follow Up    Patient presents with  Wet AMD.  In left eye.  Duration of 6 weeks.  Since onset it is stable.          Comments    6 week follow up - OCT OU, poss Avastin OS Patient denies change in vision and overall has no complaints.         Last edited by Gerda Diss on 01/15/2020 10:34 AM. (History)      Referring physician: Asencion Noble, MD [address] [place],  [place] [address]  HISTORICAL INFORMATION:   Selected notes from the MEDICAL RECORD NUMBER       CURRENT MEDICATIONS: Current Outpatient Medications (Ophthalmic Drugs)  Medication Sig  . dorzolamide-timolol (COSOPT) 22.3-6.8 MG/ML ophthalmic solution Place 1 drop into the right eye 2 (two) times daily.   . Latanoprostene Bunod (VYZULTA) 0.024 % SOLN Place 1 drop into the right eye at bedtime.    No current facility-administered medications for this visit. (Ophthalmic Drugs)   Current Outpatient Medications (Other)  Medication Sig  . carvedilol (COREG) 12.5 MG tablet Take 12.5 mg by mouth 2 (two) times daily with a meal.   . ELIQUIS 5 MG TABS tablet TAKE (1) TABLET BY MOUTH TWICE DAILY FOR BLOOD THINNER.  . furosemide (LASIX) 40 MG tablet Take 40 mg by mouth 3 (three) times a week. & one by mouth as needed on other days  . losartan (COZAAR) 25 MG tablet Take 1 tablet (25 mg total) by mouth 2 (two) times daily.  . Multiple Vitamins-Minerals (VISION FORMULA EYE HEALTH PO) Take 1 tablet by mouth 2 (two) times daily.   Marland Kitchen OVER THE COUNTER MEDICATION Take 1 capsule by mouth daily. Vision Health eye supplement  . pantoprazole (PROTONIX) 40 MG tablet Take 1 tablet (40 mg total) by mouth daily.   No current facility-administered medications for this visit. (Other)      REVIEW OF  SYSTEMS:    ALLERGIES Allergies  Allergen Reactions  . Atorvastatin Other (See Comments)    Myalgias  . Hydrocodone Other (See Comments)    GI distress.    PAST MEDICAL HISTORY Past Medical History:  Diagnosis Date  . Anxiety   . Arteriosclerotic cardiovascular disease (ASCVD)     cath Feb '07- no obstructive dz - 20% proximal LAD only; EF 65%; possible coronary artery spasm  . Atrial fibrillation (HCC)    Paroxysmal on event recorder in 2009; regular supraventricular tachycardia at a rate of 150 also identified on that study representing either atrial flutter or PSVT; onset of persistent AF in 03/2011  . Carcinoma of breast ([place]) 2001   Left mastectomy with positive nodes  . Chest pain    Long-standing and atypical  . Chronic anticoagulation 04/12/2011  . GERD (gastroesophageal reflux disease)    hiatal hernia  . Herpes zoster   . Hyperlipidemia    Lipid profile in 11/2008:282, 176, 64, 201  . Hypertension    diastolic dysfunction; normal CMet and TSH in 11/2009; normal CBC in 04/2010  . Malignant neoplasm of breast (female), unspecified site 05/27/2013   right breast lumpectomy with positive node  . Peripheral neuropathy   . Polio 1946   at age 45  . Rectal bleed   .  Renal insufficiency   . Shortness of breath dyspnea   . Vitreous hemorrhage of right eye ([place]) 10/23/2019   Past Surgical History:  Procedure Laterality Date  . BREAST LUMPECTOMY WITH NEEDLE LOCALIZATION AND AXILLARY SENTINEL LYMPH NODE BX Right 05/27/2013   Procedure: BREAST LUMPECTOMY WITH NEEDLE LOCALIZATION AND AXILLARY SENTINEL LYMPH NODE BX;  Surgeon: Pedro Earls, MD;  Location: [facility];  Service: General;  Laterality: Right;  . BREAST SURGERY    . CHOLECYSTECTOMY  1999  . COLONOSCOPY  02/22/2011   Procedure: COLONOSCOPY;  Surgeon: Rogene Houston, MD;  [facility];  Service: Endoscopy;  Laterality: N/A;; performed for scant hematochezia  . DILATION AND CURETTAGE OF  UTERUS    . ESOPHAGOGASTRODUODENOSCOPY  02/22/2011   Procedure: ESOPHAGOGASTRODUODENOSCOPY (EGD);  Surgeon: Rogene Houston, MD;  [facility];  Service: Endoscopy;  Laterality: N/A;  . ESOPHAGOGASTRODUODENOSCOPY N/A 06/02/2014   Procedure: ESOPHAGOGASTRODUODENOSCOPY (EGD);  Surgeon: Rogene Houston, MD;  [facility];  Service: Endoscopy;  Laterality: N/A;  730  . EYE SURGERY     both cataracts  . MALONEY DILATION N/A 06/02/2014   Procedure: Venia Minks DILATION;  Surgeon: Rogene Houston, MD;  [facility];  Service: Endoscopy;  Laterality: N/A;  . MASTECTOMY  2001   Left;modified radical with lymph node dissection  . PORT-A-CATH REMOVAL     pac in and out 2002    FAMILY HISTORY Family History  Problem Relation Age of Onset  . Cancer Sister        lung  . Cancer Brother        lipoma  . Cancer Brother        kidney    SOCIAL HISTORY Social History   Tobacco Use  . Smoking status: Never Smoker  . Smokeless tobacco: Never Used  Vaping Use  . Vaping Use: Never used  Substance Use Topics  . Alcohol use: No    Alcohol/week: 0.0 standard drinks  . Drug use: No         OPHTHALMIC EXAM:  Base Eye Exam    Visual Acuity (Snellen - Linear)      Right Left   Dist [place] 20/50+2 20/70+2   Dist ph [place] 20/40-2 20/60-2       Tonometry (Tonopen, 10:41 AM)      Right Left   Pressure 14 15       Pupils      Dark Light Shape React APD   Right 5 4 Round Minimal None   Left 5 3 Round Brisk None       Visual Fields (Counting fingers)      Left Right    Full    Restrictions  Partial outer superior nasal deficiency       Extraocular Movement      Right Left    Full Full       Neuro/Psych    Oriented x3: Yes   Mood/Affect: Normal       Dilation    Left eye: 1.0% Mydriacyl, 2.5% Phenylephrine @ 10:41 AM        Slit Lamp and Fundus Exam    External Exam      Right Left   External Normal Normal       Slit Lamp Exam      Right Left    Lids/Lashes Normal Normal   Conjunctiva/Sclera White and quiet White and quiet   Cornea Clear Clear   Anterior  Chamber Deep and quiet Deep and quiet   Iris Round and reactive Round and reactive   Lens Posterior chamber intraocular lens Posterior chamber intraocular lens   Anterior Vitreous Normal Normal       Fundus Exam      Right Left   Posterior Vitreous clear , vitrectomized Posterior vitreous detachment   Disc  Normal   C/D Ratio  0.3   Macula  Disciform scar, [place], no exudates, no hemorrhage, no macular thickening, Mottling, Retinal pigment epithelial mottling, Atrophy, Retinal pigment epithelial atrophy, Age related macular degeneration, Early age related macular degeneration   Vessels  Normal   Periphery  Normal          IMAGING AND PROCEDURES  Imaging and Procedures for 01/15/20  OCT, Retina - OU - Both Eyes       Right Eye Quality was good. Scan locations included subfoveal. Central Foveal Thickness: 284. Progression has improved. Findings include pigment epithelial detachment.   Left Eye Quality was good. Scan locations included subfoveal. Central Foveal Thickness: 234. Progression has improved.   Notes OD, improved anatomy nasal to the foveal region, after recent intravitreal Avastin, large disciform scar much less active.  Hopefully this will limit the growth of scotoma size       Intravitreal Injection, Pharmacologic Agent - OS - Left Eye       Time Out 01/15/2020. 11:39 AM. Confirmed correct patient, procedure, site, and patient consented.   Anesthesia Topical anesthesia was used. Anesthetic medications included Akten 3.5%.   Procedure Preparation included Ofloxacin . A 30 gauge needle was used.   Injection:  1.25 mg Bevacizumab (AVASTIN) SOLN   NDC: [address]-7, Lot: [id number]   Route: Intravitreal, Site: Left Eye, Waste: 0 mg  Post-op Post injection exam found visual acuity of at least counting fingers. The patient tolerated the procedure  well. There were no complications. The patient received written and verbal post procedure care education. Post injection medications were not given.                 ASSESSMENT/PLAN:  Exudative age-related macular degeneration of left eye with active choroidal neovascularization (HCC) OS, improved anatomy overall from treatment of wet AMD.  Currently at 6-week exam interval post intravitreal Avastin.  Repeat injection OS today and examination in 7 weeks.      ICD-10-CM   1. Exudative age-related macular degeneration of left eye with active choroidal neovascularization (HCC)  H35.3221 OCT, Retina - OU - Both Eyes    Intravitreal Injection, Pharmacologic Agent - OS - Left Eye    Bevacizumab (AVASTIN) SOLN 1.25 mg    1.  Repeat injection intravitreal Avastin OS today and examination in 7 weeks  2.  3.OD, improved anatomy nasal to the foveal region, after recent intravitreal Avastin, large disciform scar much less active.  Hopefully this will limit the growth of scotoma size  Ophthalmic Meds Ordered this visit:  Meds ordered this encounter  Medications  . Bevacizumab (AVASTIN) SOLN 1.25 mg       Return in about 7 weeks (around 03/04/2020) for dilate, OS, AVASTIN OCT.  There are no Patient Instructions on file for this visit.   Explained the diagnoses, plan, and follow up with the patient and they expressed understanding.  Patient expressed understanding of the importance of proper follow up care.   Clent Demark Lydiah Pong M.D. Diseases & Surgery of the Retina and Vitreous Retina & Diabetic [place] 01/15/20     Abbreviations: M myopia (nearsighted); A astigmatism;  H hyperopia (farsighted); P presbyopia; Mrx spectacle prescription;  CTL contact lenses; OD right eye; OS left eye; OU both eyes  XT exotropia; ET esotropia; PEK punctate epithelial keratitis; PEE punctate epithelial erosions; DES dry eye syndrome; MGD meibomian gland dysfunction; ATs artificial tears; PFAT's  preservative free artificial tears; [place] nuclear sclerotic cataract; PSC posterior subcapsular cataract; ERM epi-retinal membrane; PVD posterior vitreous detachment; RD retinal detachment; DM diabetes mellitus; DR diabetic retinopathy; NPDR non-proliferative diabetic retinopathy; PDR proliferative diabetic retinopathy; CSME clinically significant macular edema; DME diabetic macular edema; dbh dot blot hemorrhages; CWS cotton wool spot; POAG primary open angle glaucoma; C/D cup-to-disc ratio; HVF humphrey visual field; GVF goldmann visual field; OCT optical coherence tomography; IOP intraocular pressure; BRVO Branch retinal vein occlusion; CRVO central retinal vein occlusion; CRAO central retinal artery occlusion; BRAO branch retinal artery occlusion; RT retinal tear; SB scleral buckle; PPV pars plana vitrectomy; VH Vitreous hemorrhage; PRP panretinal laser photocoagulation; IVK intravitreal kenalog; VMT vitreomacular traction; MH Macular hole;  NVD neovascularization of the disc; NVE neovascularization elsewhere; AREDS age related eye disease study; ARMD age related macular degeneration; POAG primary open angle glaucoma; EBMD epithelial/anterior basement membrane dystrophy; ACIOL anterior chamber intraocular lens; IOL intraocular lens; PCIOL posterior chamber intraocular lens; Phaco/IOL phacoemulsification with intraocular lens placement; [place] photorefractive keratectomy; LASIK laser assisted in situ keratomileusis; HTN hypertension; DM diabetes mellitus; COPD chronic obstructive pulmonary disease

## 2020-01-29 DIAGNOSIS — H401112 Primary open-angle glaucoma, right eye, moderate stage: Secondary | ICD-10-CM | POA: Diagnosis not present

## 2020-03-04 ENCOUNTER — Ambulatory Visit (INDEPENDENT_AMBULATORY_CARE_PROVIDER_SITE_OTHER): Payer: PPO | Admitting: Ophthalmology

## 2020-03-04 ENCOUNTER — Encounter (INDEPENDENT_AMBULATORY_CARE_PROVIDER_SITE_OTHER): Payer: Self-pay | Admitting: Ophthalmology

## 2020-03-04 ENCOUNTER — Encounter (INDEPENDENT_AMBULATORY_CARE_PROVIDER_SITE_OTHER): Payer: PPO | Admitting: Ophthalmology

## 2020-03-04 ENCOUNTER — Other Ambulatory Visit: Payer: Self-pay

## 2020-03-04 DIAGNOSIS — H353221 Exudative age-related macular degeneration, left eye, with active choroidal neovascularization: Secondary | ICD-10-CM | POA: Diagnosis not present

## 2020-03-04 DIAGNOSIS — H353211 Exudative age-related macular degeneration, right eye, with active choroidal neovascularization: Secondary | ICD-10-CM | POA: Diagnosis not present

## 2020-03-04 DIAGNOSIS — H43812 Vitreous degeneration, left eye: Secondary | ICD-10-CM

## 2020-03-04 MED ORDER — BEVACIZUMAB CHEMO INJECTION 1.25MG/0.05ML SYRINGE FOR KALEIDOSCOPE
1.2500 mg | INTRAVITREAL | Status: AC | PRN
  Administered 2020-03-04: 1.25 mg via INTRAVITREAL

## 2020-03-04 NOTE — Progress Notes (Signed)
03/04/2020     CHIEF COMPLAINT Patient presents for Retina Follow Up   HISTORY OF PRESENT ILLNESS: [name] is a 86 y.o. female who presents to the clinic today for:   HPI    Retina Follow Up    Patient presents with  Wet AMD.  In left eye.  Severity is moderate.  Duration of 7 weeks.  Since onset it is stable.  I, the attending physician,  performed the HPI with the patient and updated documentation appropriately.          Comments    7 Week Wet AMD f\u OS. Possible Avastin OS. OCT  Pt states vision is stable. Denies any complaints.       Last edited by Tilda Franco on 03/04/2020  8:30 AM. (History)      Referring physician: Asencion Noble, MD [address] [place],  [place] [address]  HISTORICAL INFORMATION:   Selected notes from the MEDICAL RECORD NUMBER       CURRENT MEDICATIONS: Current Outpatient Medications (Ophthalmic Drugs)  Medication Sig  . dorzolamide-timolol (COSOPT) 22.3-6.8 MG/ML ophthalmic solution Place 1 drop into the right eye 2 (two) times daily.   . Latanoprostene Bunod (VYZULTA) 0.024 % SOLN Place 1 drop into the right eye at bedtime.    No current facility-administered medications for this visit. (Ophthalmic Drugs)   Current Outpatient Medications (Other)  Medication Sig  . carvedilol (COREG) 12.5 MG tablet Take 12.5 mg by mouth 2 (two) times daily with a meal.   . ELIQUIS 5 MG TABS tablet TAKE (1) TABLET BY MOUTH TWICE DAILY FOR BLOOD THINNER.  . furosemide (LASIX) 40 MG tablet Take 40 mg by mouth 3 (three) times a week. & one by mouth as needed on other days  . losartan (COZAAR) 25 MG tablet Take 1 tablet (25 mg total) by mouth 2 (two) times daily.  . Multiple Vitamins-Minerals (VISION FORMULA EYE HEALTH PO) Take 1 tablet by mouth 2 (two) times daily.   Marland Kitchen OVER THE COUNTER MEDICATION Take 1 capsule by mouth daily. Vision Health eye supplement  . pantoprazole (PROTONIX) 40 MG tablet Take 1 tablet (40 mg total) by mouth daily.    No current facility-administered medications for this visit. (Other)      REVIEW OF SYSTEMS:    ALLERGIES Allergies  Allergen Reactions  . Atorvastatin Other (See Comments)    Myalgias  . Hydrocodone Other (See Comments)    GI distress.    PAST MEDICAL HISTORY Past Medical History:  Diagnosis Date  . Anxiety   . Arteriosclerotic cardiovascular disease (ASCVD)     cath Feb '07- no obstructive dz - 20% proximal LAD only; EF 65%; possible coronary artery spasm  . Atrial fibrillation (HCC)    Paroxysmal on event recorder in 2009; regular supraventricular tachycardia at a rate of 150 also identified on that study representing either atrial flutter or PSVT; onset of persistent AF in 03/2011  . Carcinoma of breast ([facility]) 2001   Left mastectomy with positive nodes  . Chest pain    Long-standing and atypical  . Chronic anticoagulation 04/12/2011  . GERD (gastroesophageal reflux disease)    hiatal hernia  . Herpes zoster   . Hyperlipidemia    Lipid profile in 11/2008:282, 176, 64, 201  . Hypertension    diastolic dysfunction; normal CMet and TSH in 11/2009; normal CBC in 04/2010  . Malignant neoplasm of breast (female), unspecified site 05/27/2013   right breast lumpectomy with positive node  .  Peripheral neuropathy   . Polio 1946   at age 58  . Rectal bleed   . Renal insufficiency   . Shortness of breath dyspnea   . Vitreous hemorrhage of right eye ([facility]) 10/23/2019   Past Surgical History:  Procedure Laterality Date  . BREAST LUMPECTOMY WITH NEEDLE LOCALIZATION AND AXILLARY SENTINEL LYMPH NODE BX Right 05/27/2013   Procedure: BREAST LUMPECTOMY WITH NEEDLE LOCALIZATION AND AXILLARY SENTINEL LYMPH NODE BX;  Surgeon: Pedro Earls, MD;  Location: [facility];  Service: General;  Laterality: Right;  . BREAST SURGERY    . CHOLECYSTECTOMY  1999  . COLONOSCOPY  02/22/2011   Procedure: COLONOSCOPY;  Surgeon: Rogene Houston, MD;  [facility];  Service:  Endoscopy;  Laterality: N/A;; performed for scant hematochezia  . DILATION AND CURETTAGE OF UTERUS    . ESOPHAGOGASTRODUODENOSCOPY  02/22/2011   Procedure: ESOPHAGOGASTRODUODENOSCOPY (EGD);  Surgeon: Rogene Houston, MD;  [facility];  Service: Endoscopy;  Laterality: N/A;  . ESOPHAGOGASTRODUODENOSCOPY N/A 06/02/2014   Procedure: ESOPHAGOGASTRODUODENOSCOPY (EGD);  Surgeon: Rogene Houston, MD;  [facility];  Service: Endoscopy;  Laterality: N/A;  730  . EYE SURGERY     both cataracts  . MALONEY DILATION N/A 06/02/2014   Procedure: Venia Minks DILATION;  Surgeon: Rogene Houston, MD;  [facility];  Service: Endoscopy;  Laterality: N/A;  . MASTECTOMY  2001   Left;modified radical with lymph node dissection  . PORT-A-CATH REMOVAL     pac in and out 2002    FAMILY HISTORY Family History  Problem Relation Age of Onset  . Cancer Sister        lung  . Cancer Brother        lipoma  . Cancer Brother        kidney    SOCIAL HISTORY Social History   Tobacco Use  . Smoking status: Never Smoker  . Smokeless tobacco: Never Used  Vaping Use  . Vaping Use: Never used  Substance Use Topics  . Alcohol use: No    Alcohol/week: 0.0 standard drinks  . Drug use: No         OPHTHALMIC EXAM: Base Eye Exam    Visual Acuity (Snellen - Linear)      Right Left   Dist [place] 20/60 20/60 -2   Dist ph [place] 20/50 -2 20/30 -1       Tonometry (Tonopen, 8:35 AM)      Right Left   Pressure 14 13       Pupils      Pupils Dark Light Shape React APD   Right PERRL 5 4 Round Slow None   Left PERRL 5 3 Round Brisk None       Visual Fields (Counting fingers)      Left Right    Full Full       Neuro/Psych    Oriented x3: Yes   Mood/Affect: Normal       Dilation    Left eye: 1.0% Mydriacyl, 2.5% Phenylephrine @ 8:35 AM        Slit Lamp and Fundus Exam    External Exam      Right Left   External Normal Normal       Slit Lamp Exam      Right Left    Lids/Lashes Normal Normal   Conjunctiva/Sclera White and quiet White and quiet   Cornea Clear Clear   Anterior Chamber Deep and quiet Deep  and quiet   Iris Round and reactive Round and reactive   Lens Posterior chamber intraocular lens Posterior chamber intraocular lens   Anterior Vitreous Normal Normal       Fundus Exam      Right Left   Posterior Vitreous clear , vitrectomized Posterior vitreous detachment   Disc  Normal   C/D Ratio  0.3   Macula  Disciform scar, [place], no exudates, no hemorrhage, no macular thickening, Mottling, Retinal pigment epithelial mottling, Atrophy, Retinal pigment epithelial atrophy, Age related macular degeneration, Early age related macular degeneration   Vessels  Normal   Periphery  Normal, no holes          IMAGING AND PROCEDURES  Imaging and Procedures for 03/04/20  OCT, Retina - OU - Both Eyes       Right Eye Quality was good. Scan locations included subfoveal. Central Foveal Thickness: 345. Findings include abnormal foveal contour, subretinal fluid, pigment epithelial detachment.   Left Eye Quality was good. Scan locations included subfoveal. Central Foveal Thickness: 253. Progression has improved. Findings include abnormal foveal contour, cystoid macular edema.   Notes OD with vascularized PED, and subretinal fluid, repeat injection as scheduled OD next  OS, micro-CME has stabilized and improved still at 7-week interval for intravitreal Avastin, repeat injection today and extend interval of examination to 8 weeks       Intravitreal Injection, Pharmacologic Agent - OS - Left Eye       Time Out 03/04/2020. 9:11 AM. Confirmed correct patient, procedure, site, and patient consented.   Anesthesia Topical anesthesia was used. Anesthetic medications included [place] 3.5%.   Procedure Preparation included Ofloxacin , Tobramycin 0.3%, 10% betadine to eyelids, 5% betadine to ocular surface. A supplied needle was used.   Injection:  1.25  mg Bevacizumab (AVASTIN) SOLN   NDC: [address]-9, Lot: [id number]   Route: Intravitreal, Site: Left Eye, Waste: 0 mg  Post-op Post injection exam found visual acuity of at least counting fingers. The patient tolerated the procedure well. There were no complications. The patient received written and verbal post procedure care education. Post injection medications were not given.                 ASSESSMENT/PLAN:  Exudative age-related macular degeneration of left eye with active choroidal neovascularization (HCC) Improved anatomy OS, on intravitreal Avastin, at 7-week interval.  We will repeat injection today and examination in 8 weeks left eye  Exudative age-related macular degeneration of right eye with active choroidal neovascularization ([facility]) Vascularized PED now with increased subretinal fluid, repeat examination right eye as scheduled  Posterior vitreous detachment of left eye   The nature of posterior vitreous detachment was discussed with the patient as well as its physiology, its age prevalence, and its possible implication regarding retinal breaks and detachment.  An informational brochure was given to the patient.  All the patient's questions were answered.  The patient was asked to return if new or different flashes or floaters develops.   Patient was instructed to contact office immediately if any changes were noticed. I explained to the patient that vitreous inside the eye is similar to jello inside a bowl. As the jello melts it can start to pull away from the bowl, similarly the vitreous throughout our lives can begin to pull away from the retina. That process is called a posterior vitreous detachment. In some cases, the vitreous can tug hard enough on the retina to form a retinal tear. I discussed with the patient the  signs and symptoms of a retinal detachment.  Do not rub the eye.  No new breaks      ICD-10-CM   1. Exudative age-related macular degeneration of left eye with  active choroidal neovascularization (HCC)  H35.3221 OCT, Retina - OU - Both Eyes    Intravitreal Injection, Pharmacologic Agent - OS - Left Eye    Bevacizumab (AVASTIN) SOLN 1.25 mg  2. Exudative age-related macular degeneration of right eye with active choroidal neovascularization ([facility])  H35.3211   3. Posterior vitreous detachment of left eye  H43.812     1.  Repeat injection intravitreal Avastin today and extend interval examination to 8 weeks 2.  No new retinal holes or tears left eye  3.  Next visit OD as scheduled possible injection  Ophthalmic Meds Ordered this visit:  Meds ordered this encounter  Medications  . Bevacizumab (AVASTIN) SOLN 1.25 mg       Return in about 8 weeks (around 04/29/2020) for dilate, OS, AVASTIN OCT.  There are no Patient Instructions on file for this visit.   Explained the diagnoses, plan, and follow up with the patient and they expressed understanding.  Patient expressed understanding of the importance of proper follow up care.   Clent Demark Deem Marmol M.D. Diseases & Surgery of the Retina and Vitreous Retina & Diabetic [place] 03/04/20     Abbreviations: M myopia (nearsighted); A astigmatism; H hyperopia (farsighted); P presbyopia; Mrx spectacle prescription;  CTL contact lenses; OD right eye; OS left eye; OU both eyes  XT exotropia; ET esotropia; PEK punctate epithelial keratitis; PEE punctate epithelial erosions; DES dry eye syndrome; MGD meibomian gland dysfunction; ATs artificial tears; PFAT's preservative free artificial tears; [place] nuclear sclerotic cataract; PSC posterior subcapsular cataract; ERM epi-retinal membrane; PVD posterior vitreous detachment; RD retinal detachment; DM diabetes mellitus; DR diabetic retinopathy; NPDR non-proliferative diabetic retinopathy; PDR proliferative diabetic retinopathy; CSME clinically significant macular edema; DME diabetic macular edema; dbh dot blot hemorrhages; CWS cotton wool spot; POAG primary open angle  glaucoma; C/D cup-to-disc ratio; HVF humphrey visual field; GVF goldmann visual field; OCT optical coherence tomography; IOP intraocular pressure; BRVO Branch retinal vein occlusion; CRVO central retinal vein occlusion; CRAO central retinal artery occlusion; BRAO branch retinal artery occlusion; RT retinal tear; SB scleral buckle; PPV pars plana vitrectomy; VH Vitreous hemorrhage; PRP panretinal laser photocoagulation; IVK intravitreal kenalog; VMT vitreomacular traction; MH Macular hole;  NVD neovascularization of the disc; NVE neovascularization elsewhere; AREDS age related eye disease study; ARMD age related macular degeneration; POAG primary open angle glaucoma; EBMD epithelial/anterior basement membrane dystrophy; ACIOL anterior chamber intraocular lens; IOL intraocular lens; PCIOL posterior chamber intraocular lens; Phaco/IOL phacoemulsification with intraocular lens placement; [place] photorefractive keratectomy; LASIK laser assisted in situ keratomileusis; HTN hypertension; DM diabetes mellitus; COPD chronic obstructive pulmonary disease

## 2020-03-04 NOTE — Assessment & Plan Note (Signed)
The nature of posterior vitreous detachment was discussed with the patient as well as its physiology, its age prevalence, and its possible implication regarding retinal breaks and detachment.  An informational brochure was given to the patient.  All the patient's questions were answered.  The patient was asked to return if new or different flashes or floaters develops.   Patient was instructed to contact office immediately if any changes were noticed. I explained to the patient that vitreous inside the eye is similar to jello inside a bowl. As the jello melts it can start to pull away from the bowl, similarly the vitreous throughout our lives can begin to pull away from the retina. That process is called a posterior vitreous detachment. In some cases, the vitreous can tug hard enough on the retina to form a retinal tear. I discussed with the patient the signs and symptoms of a retinal detachment.  Do not rub the eye.  No new breaks

## 2020-03-04 NOTE — Assessment & Plan Note (Signed)
Vascularized PED now with increased subretinal fluid, repeat examination right eye as scheduled

## 2020-03-04 NOTE — Assessment & Plan Note (Signed)
Improved anatomy OS, on intravitreal Avastin, at 7-week interval.  We will repeat injection today and examination in 8 weeks left eye

## 2020-03-09 ENCOUNTER — Encounter (INDEPENDENT_AMBULATORY_CARE_PROVIDER_SITE_OTHER): Payer: PPO | Admitting: Ophthalmology

## 2020-03-16 ENCOUNTER — Other Ambulatory Visit: Payer: Self-pay

## 2020-03-16 ENCOUNTER — Encounter (INDEPENDENT_AMBULATORY_CARE_PROVIDER_SITE_OTHER): Payer: Self-pay | Admitting: Ophthalmology

## 2020-03-16 ENCOUNTER — Ambulatory Visit (INDEPENDENT_AMBULATORY_CARE_PROVIDER_SITE_OTHER): Payer: PPO | Admitting: Ophthalmology

## 2020-03-16 DIAGNOSIS — H353211 Exudative age-related macular degeneration, right eye, with active choroidal neovascularization: Secondary | ICD-10-CM

## 2020-03-16 MED ORDER — BEVACIZUMAB CHEMO INJECTION 1.25MG/0.05ML SYRINGE FOR KALEIDOSCOPE
1.2500 mg | INTRAVITREAL | Status: AC | PRN
  Administered 2020-03-16: 1.25 mg via INTRAVITREAL

## 2020-03-16 NOTE — Progress Notes (Signed)
03/16/2020     CHIEF COMPLAINT Patient presents for Retina Follow Up   HISTORY OF PRESENT ILLNESS: [name] is a 86 y.o. female who presents to the clinic today for:   HPI    Retina Follow Up    Patient presents with  Wet AMD.  In right eye.  This started 10 weeks ago.  Severity is mild.  Duration of 10 weeks.  Since onset it is stable.          Comments    10 Week AMD F/U OD, poss Avastin OD  Pt denies noticeable changes to [place] OU since last visit. Pt denies ocular pain, flashes of light, or floaters OU.         Last edited by Rockie Neighbours, [place] on 03/16/2020  8:04 AM. (History)      Referring physician: Asencion Noble, MD [address] [place],  [place] [address]  HISTORICAL INFORMATION:   Selected notes from the MEDICAL RECORD NUMBER       CURRENT MEDICATIONS: Current Outpatient Medications (Ophthalmic Drugs)  Medication Sig  . dorzolamide-timolol (COSOPT) 22.3-6.8 MG/ML ophthalmic solution Place 1 drop into the right eye 2 (two) times daily.   . Latanoprostene Bunod (VYZULTA) 0.024 % SOLN Place 1 drop into the right eye at bedtime.    No current facility-administered medications for this visit. (Ophthalmic Drugs)   Current Outpatient Medications (Other)  Medication Sig  . carvedilol (COREG) 12.5 MG tablet Take 12.5 mg by mouth 2 (two) times daily with a meal.   . ELIQUIS 5 MG TABS tablet TAKE (1) TABLET BY MOUTH TWICE DAILY FOR BLOOD THINNER.  . furosemide (LASIX) 40 MG tablet Take 40 mg by mouth 3 (three) times a week. & one by mouth as needed on other days  . losartan (COZAAR) 25 MG tablet Take 1 tablet (25 mg total) by mouth 2 (two) times daily.  . Multiple Vitamins-Minerals (VISION FORMULA EYE HEALTH PO) Take 1 tablet by mouth 2 (two) times daily.   Marland Kitchen OVER THE COUNTER MEDICATION Take 1 capsule by mouth daily. Vision Health eye supplement  . pantoprazole (PROTONIX) 40 MG tablet Take 1 tablet (40 mg total) by mouth daily.   No current  facility-administered medications for this visit. (Other)      REVIEW OF SYSTEMS:    ALLERGIES Allergies  Allergen Reactions  . Atorvastatin Other (See Comments)    Myalgias  . Hydrocodone Other (See Comments)    GI distress.    PAST MEDICAL HISTORY Past Medical History:  Diagnosis Date  . Anxiety   . Arteriosclerotic cardiovascular disease (ASCVD)     cath Feb '07- no obstructive dz - 20% proximal LAD only; EF 65%; possible coronary artery spasm  . Atrial fibrillation (HCC)    Paroxysmal on event recorder in 2009; regular supraventricular tachycardia at a rate of 150 also identified on that study representing either atrial flutter or PSVT; onset of persistent AF in 03/2011  . Carcinoma of breast ([place]) 2001   Left mastectomy with positive nodes  . Chest pain    Long-standing and atypical  . Chronic anticoagulation 04/12/2011  . GERD (gastroesophageal reflux disease)    hiatal hernia  . Herpes zoster   . Hyperlipidemia    Lipid profile in 11/2008:282, 176, 64, 201  . Hypertension    diastolic dysfunction; normal CMet and TSH in 11/2009; normal CBC in 04/2010  . Malignant neoplasm of breast (female), unspecified site 05/27/2013   right breast lumpectomy with  positive node  . Peripheral neuropathy   . Polio 1946   at age 75  . Rectal bleed   . Renal insufficiency   . Shortness of breath dyspnea   . Vitreous hemorrhage of right eye ([place]) 10/23/2019   Past Surgical History:  Procedure Laterality Date  . BREAST LUMPECTOMY WITH NEEDLE LOCALIZATION AND AXILLARY SENTINEL LYMPH NODE BX Right 05/27/2013   Procedure: BREAST LUMPECTOMY WITH NEEDLE LOCALIZATION AND AXILLARY SENTINEL LYMPH NODE BX;  Surgeon: Pedro Earls, MD;  Location: [facility];  Service: General;  Laterality: Right;  . BREAST SURGERY    . CHOLECYSTECTOMY  1999  . COLONOSCOPY  02/22/2011   Procedure: COLONOSCOPY;  Surgeon: Rogene Houston, MD;  [facility];  Service: Endoscopy;   Laterality: N/A;; performed for scant hematochezia  . DILATION AND CURETTAGE OF UTERUS    . ESOPHAGOGASTRODUODENOSCOPY  02/22/2011   Procedure: ESOPHAGOGASTRODUODENOSCOPY (EGD);  Surgeon: Rogene Houston, MD;  [facility];  Service: Endoscopy;  Laterality: N/A;  . ESOPHAGOGASTRODUODENOSCOPY N/A 06/02/2014   Procedure: ESOPHAGOGASTRODUODENOSCOPY (EGD);  Surgeon: Rogene Houston, MD;  [facility];  Service: Endoscopy;  Laterality: N/A;  730  . EYE SURGERY     both cataracts  . MALONEY DILATION N/A 06/02/2014   Procedure: Venia Minks DILATION;  Surgeon: Rogene Houston, MD;  [facility];  Service: Endoscopy;  Laterality: N/A;  . MASTECTOMY  2001   Left;modified radical with lymph node dissection  . PORT-A-CATH REMOVAL     pac in and out 2002    FAMILY HISTORY Family History  Problem Relation Age of Onset  . Cancer Sister        lung  . Cancer Brother        lipoma  . Cancer Brother        kidney    SOCIAL HISTORY Social History   Tobacco Use  . Smoking status: Never Smoker  . Smokeless tobacco: Never Used  Vaping Use  . Vaping Use: Never used  Substance Use Topics  . Alcohol use: No    Alcohol/week: 0.0 standard drinks  . Drug use: No         OPHTHALMIC EXAM:  Base Eye Exam    Visual Acuity (ETDRS)      Right Left   Dist [place] 20/50 +2 20/70 +1   Dist ph [place] NI 20/50 -1       Tonometry (Tonopen, 8:06 AM)      Right Left   Pressure 12 11       Pupils      Dark Light Shape React APD   Right 5 4 Round Slow None   Left 5 3 Round Brisk None       Visual Fields (Counting fingers)      Left Right    Full    Restrictions  Partial outer superior nasal deficiency       Extraocular Movement      Right Left    Full Full       Neuro/Psych    Oriented x3: Yes   Mood/Affect: Normal       Dilation    Right eye: 1.0% Mydriacyl, 2.5% Phenylephrine @ 8:09 AM        Slit Lamp and Fundus Exam    External Exam      Right Left    External Normal Normal       Slit Lamp Exam  Right Left   Lids/Lashes Normal Normal   Conjunctiva/Sclera White and quiet White and quiet   Cornea Clear Clear   Anterior Chamber Deep and quiet Deep and quiet   Iris Round and reactive Round and reactive   Lens Posterior chamber intraocular lens Posterior chamber intraocular lens   Anterior Vitreous Normal Normal       Fundus Exam      Right Left   Posterior Vitreous Vitrectomized    Disc Normal    C/D Ratio 0.45    Macula Retinal pigment epithelial mottling, Retinal pigment epithelial detachment, Macular thickening, Retinal pigment epithelial atrophy    Vessels Normal    Periphery Normal           IMAGING AND PROCEDURES  Imaging and Procedures for 03/16/20  OCT, Retina - OU - Both Eyes       Right Eye Quality was good. Scan locations included subfoveal. Central Foveal Thickness: 344. Findings include abnormal foveal contour, subretinal fluid, pigment epithelial detachment.   Left Eye Quality was good. Scan locations included subfoveal. Central Foveal Thickness: 243. Progression has improved. Findings include abnormal foveal contour, cystoid macular edema.   Notes OD with vascularized PED, and subretinal fluid increased nasal to the fovea, at 10-week interval, will, repeat injection OD  And next visit in 7 to 8 weeks OD  OS, micro-CME has stabilized and improved still at 7-week interval for intravitreal Avastin, repeat injection today and extend interval of examination to 8 weeks       Intravitreal Injection, Pharmacologic Agent - OD - Right Eye       Time Out 03/16/2020. 8:39 AM. Confirmed correct patient, procedure, site, and patient consented.   Anesthesia Topical anesthesia was used. Anesthetic medications included Akten 3.5%.   Procedure Preparation included Tobramycin 0.3%, 10% betadine to eyelids, 5% betadine to ocular surface, Ofloxacin . A 30 gauge needle was used.   Injection:  1.25 mg Bevacizumab  (AVASTIN) SOLN   NDC: [address]-2   Route: Intravitreal, Site: Right Eye, Waste: 0 mg  Post-op Post injection exam found visual acuity of at least counting fingers. The patient tolerated the procedure well. There were no complications. The patient received written and verbal post procedure care education. Post injection medications were not given.                 ASSESSMENT/PLAN:  Exudative age-related macular degeneration of right eye with active choroidal neovascularization (HCC) Follow-up today for vascularized PED, wet CNVM, increased subretinal fluid nasal to the fovea, at 10-week interval.  We will repeat injection today to maintain and stabilize, and examination OD in 7 weeks      ICD-10-CM   1. Exudative age-related macular degeneration of right eye with active choroidal neovascularization (HCC)  H35.3211 OCT, Retina - OU - Both Eyes    Intravitreal Injection, Pharmacologic Agent - OD - Right Eye    Bevacizumab (AVASTIN) SOLN 1.25 mg    1.  We will repeat injection intravitreal Avastin OD today and examination in 7 weeks OD  2.  OS, status post recent injection Avastin, improved and stable with good acuity, repeat examination OS as scheduled  3.  Ophthalmic Meds Ordered this visit:  Meds ordered this encounter  Medications  . Bevacizumab (AVASTIN) SOLN 1.25 mg       Return in about 7 weeks (around 05/04/2020) for AVASTIN OCT, OD.  Patient Instructions  Patient to report any new onset visual acuity changes or distortions from either eye  Explained the diagnoses, plan, and follow up with the patient and they expressed understanding.  Patient expressed understanding of the importance of proper follow up care.   Clent Demark Adama Ivins M.D. Diseases & Surgery of the Retina and Vitreous Retina & Diabetic [place] 03/16/20     Abbreviations: M myopia (nearsighted); A astigmatism; H hyperopia (farsighted); P presbyopia; Mrx spectacle prescription;  CTL contact  lenses; OD right eye; OS left eye; OU both eyes  XT exotropia; ET esotropia; PEK punctate epithelial keratitis; PEE punctate epithelial erosions; DES dry eye syndrome; MGD meibomian gland dysfunction; ATs artificial tears; PFAT's preservative free artificial tears; [place] nuclear sclerotic cataract; PSC posterior subcapsular cataract; ERM epi-retinal membrane; PVD posterior vitreous detachment; RD retinal detachment; DM diabetes mellitus; DR diabetic retinopathy; NPDR non-proliferative diabetic retinopathy; PDR proliferative diabetic retinopathy; CSME clinically significant macular edema; DME diabetic macular edema; dbh dot blot hemorrhages; CWS cotton wool spot; POAG primary open angle glaucoma; C/D cup-to-disc ratio; HVF humphrey visual field; GVF goldmann visual field; OCT optical coherence tomography; IOP intraocular pressure; BRVO Branch retinal vein occlusion; CRVO central retinal vein occlusion; CRAO central retinal artery occlusion; BRAO branch retinal artery occlusion; RT retinal tear; SB scleral buckle; PPV pars plana vitrectomy; VH Vitreous hemorrhage; PRP panretinal laser photocoagulation; IVK intravitreal kenalog; VMT vitreomacular traction; MH Macular hole;  NVD neovascularization of the disc; NVE neovascularization elsewhere; AREDS age related eye disease study; ARMD age related macular degeneration; POAG primary open angle glaucoma; EBMD epithelial/anterior basement membrane dystrophy; ACIOL anterior chamber intraocular lens; IOL intraocular lens; PCIOL posterior chamber intraocular lens; Phaco/IOL phacoemulsification with intraocular lens placement; [place] photorefractive keratectomy; LASIK laser assisted in situ keratomileusis; HTN hypertension; DM diabetes mellitus; COPD chronic obstructive pulmonary disease

## 2020-03-16 NOTE — Patient Instructions (Signed)
Patient to report any new onset visual acuity changes or distortions from either eye

## 2020-03-16 NOTE — Assessment & Plan Note (Signed)
Follow-up today for vascularized PED, wet CNVM, increased subretinal fluid nasal to the fovea, at 10-week interval.  We will repeat injection today to maintain and stabilize, and examination OD in 7 weeks

## 2020-03-29 ENCOUNTER — Other Ambulatory Visit: Payer: Self-pay | Admitting: Internal Medicine

## 2020-04-12 DIAGNOSIS — R945 Abnormal results of liver function studies: Secondary | ICD-10-CM | POA: Diagnosis not present

## 2020-04-12 DIAGNOSIS — D692 Other nonthrombocytopenic purpura: Secondary | ICD-10-CM | POA: Diagnosis not present

## 2020-04-12 DIAGNOSIS — Z79899 Other long term (current) drug therapy: Secondary | ICD-10-CM | POA: Diagnosis not present

## 2020-04-12 DIAGNOSIS — I5089 Other heart failure: Secondary | ICD-10-CM | POA: Diagnosis not present

## 2020-04-12 DIAGNOSIS — I482 Chronic atrial fibrillation, unspecified: Secondary | ICD-10-CM | POA: Diagnosis not present

## 2020-04-19 DIAGNOSIS — I509 Heart failure, unspecified: Secondary | ICD-10-CM | POA: Diagnosis not present

## 2020-04-19 DIAGNOSIS — I482 Chronic atrial fibrillation, unspecified: Secondary | ICD-10-CM | POA: Diagnosis not present

## 2020-04-29 ENCOUNTER — Encounter (INDEPENDENT_AMBULATORY_CARE_PROVIDER_SITE_OTHER): Payer: Self-pay | Admitting: Ophthalmology

## 2020-04-29 ENCOUNTER — Other Ambulatory Visit: Payer: Self-pay

## 2020-04-29 ENCOUNTER — Ambulatory Visit (INDEPENDENT_AMBULATORY_CARE_PROVIDER_SITE_OTHER): Payer: PPO | Admitting: Ophthalmology

## 2020-04-29 DIAGNOSIS — H353221 Exudative age-related macular degeneration, left eye, with active choroidal neovascularization: Secondary | ICD-10-CM | POA: Diagnosis not present

## 2020-04-29 DIAGNOSIS — H353114 Nonexudative age-related macular degeneration, right eye, advanced atrophic with subfoveal involvement: Secondary | ICD-10-CM | POA: Diagnosis not present

## 2020-04-29 MED ORDER — BEVACIZUMAB CHEMO INJECTION 1.25MG/0.05ML SYRINGE FOR KALEIDOSCOPE
1.2500 mg | INTRAVITREAL | Status: AC | PRN
  Administered 2020-04-29: 1.25 mg via INTRAVITREAL

## 2020-04-29 NOTE — Assessment & Plan Note (Signed)
Stable OD, will continue to monitor and follow-up OD as scheduled

## 2020-04-29 NOTE — Assessment & Plan Note (Signed)
OS with recurrent CME at the 8-week follow-up interval, will repeat injection Avastin OS today and examination in 7 weeks

## 2020-04-29 NOTE — Patient Instructions (Signed)
Patient instructed to use natural tears every 5 to 10 minutes for the next 2 hours to protect the ocular surface left eye from drying out, prevent foreign body sensation that she often has post injection.  I explained the patient that the foreign body sensation is not from the injection itself but from the scratch that can occur on the cornea from drying out after use of topical anesthetic

## 2020-04-29 NOTE — Progress Notes (Signed)
04/29/2020     CHIEF COMPLAINT Patient presents for Retina Follow Up   HISTORY OF PRESENT ILLNESS: [name] is a 86 y.o. female who presents to the clinic today for:   HPI    Retina Follow Up    Patient presents with  Wet AMD.  In left eye.  This started 8 weeks ago.  Severity is mild.  Duration of 8 weeks.  Since onset it is stable.          Comments    8 Week AMD F/U OS, poss Avastin OS  Pt denies noticeable changes to [place] OU since last visit. Pt denies ocular pain, flashes of light, or floaters OU. Pt c/o lightheadedness off and on and wants to know if this is caused by her eyes.       Last edited by Rockie Neighbours, [place] on 04/29/2020  8:02 AM. (History)      Referring physician: Asencion Noble, MD [address] [place],   [address]  HISTORICAL INFORMATION:   Selected notes from the MEDICAL RECORD NUMBER       CURRENT MEDICATIONS: Current Outpatient Medications (Ophthalmic Drugs)  Medication Sig  . dorzolamide-timolol (COSOPT) 22.3-6.8 MG/ML ophthalmic solution Place 1 drop into the right eye 2 (two) times daily.   . Latanoprostene Bunod (VYZULTA) 0.024 % SOLN Place 1 drop into the right eye at bedtime.    No current facility-administered medications for this visit. (Ophthalmic Drugs)   Current Outpatient Medications (Other)  Medication Sig  . carvedilol (COREG) 12.5 MG tablet Take 12.5 mg by mouth 2 (two) times daily with a meal.   . ELIQUIS 5 MG TABS tablet TAKE (1) TABLET BY MOUTH TWICE DAILY FOR BLOOD THINNER.  . furosemide (LASIX) 40 MG tablet TAKE ONE TABLET BY MOUTH ONCE DAILY.  Marland Kitchen losartan (COZAAR) 25 MG tablet Take 1 tablet (25 mg total) by mouth 2 (two) times daily.  . Multiple Vitamins-Minerals (VISION FORMULA EYE HEALTH PO) Take 1 tablet by mouth 2 (two) times daily.   Marland Kitchen OVER THE COUNTER MEDICATION Take 1 capsule by mouth daily. Vision Health eye supplement  . pantoprazole (PROTONIX) 40 MG tablet Take 1 tablet (40 mg total) by mouth  daily.   No current facility-administered medications for this visit. (Other)      REVIEW OF SYSTEMS:    ALLERGIES Allergies  Allergen Reactions  . Atorvastatin Other (See Comments)    Myalgias  . Hydrocodone Other (See Comments)    GI distress.    PAST MEDICAL HISTORY Past Medical History:  Diagnosis Date  . Anxiety   . Arteriosclerotic cardiovascular disease (ASCVD)     cath Feb '07- no obstructive dz - 20% proximal LAD only; EF 65%; possible coronary artery spasm  . Atrial fibrillation (HCC)    Paroxysmal on event recorder in 2009; regular supraventricular tachycardia at a rate of 150 also identified on that study representing either atrial flutter or PSVT; onset of persistent AF in 03/2011  . Carcinoma of breast ([place]) 2001   Left mastectomy with positive nodes  . Chest pain    Long-standing and atypical  . Chronic anticoagulation 04/12/2011  . GERD (gastroesophageal reflux disease)    hiatal hernia  . Herpes zoster   . Hyperlipidemia    Lipid profile in 11/2008:282, 176, 64, 201  . Hypertension    diastolic dysfunction; normal CMet and TSH in 11/2009; normal CBC in 04/2010  . Malignant neoplasm of breast (female), unspecified site 05/27/2013   right  breast lumpectomy with positive node  . Peripheral neuropathy   . Polio 1946   at age 66  . Rectal bleed   . Renal insufficiency   . Shortness of breath dyspnea   . Vitreous hemorrhage of right eye ([place]) 10/23/2019   Past Surgical History:  Procedure Laterality Date  . BREAST LUMPECTOMY WITH NEEDLE LOCALIZATION AND AXILLARY SENTINEL LYMPH NODE BX Right 05/27/2013   Procedure: BREAST LUMPECTOMY WITH NEEDLE LOCALIZATION AND AXILLARY SENTINEL LYMPH NODE BX;  Surgeon: Pedro Earls, MD;  Location: [facility];  Service: General;  Laterality: Right;  . BREAST SURGERY    . CHOLECYSTECTOMY  1999  . COLONOSCOPY  02/22/2011   Procedure: COLONOSCOPY;  Surgeon: Rogene Houston, MD;  [facility];   Service: Endoscopy;  Laterality: N/A;; performed for scant hematochezia  . DILATION AND CURETTAGE OF UTERUS    . ESOPHAGOGASTRODUODENOSCOPY  02/22/2011   Procedure: ESOPHAGOGASTRODUODENOSCOPY (EGD);  Surgeon: Rogene Houston, MD;  [facility];  Service: Endoscopy;  Laterality: N/A;  . ESOPHAGOGASTRODUODENOSCOPY N/A 06/02/2014   Procedure: ESOPHAGOGASTRODUODENOSCOPY (EGD);  Surgeon: Rogene Houston, MD;  [facility];  Service: Endoscopy;  Laterality: N/A;  730  . EYE SURGERY     both cataracts  . MALONEY DILATION N/A 06/02/2014   Procedure: Venia Minks DILATION;  Surgeon: Rogene Houston, MD;  [facility];  Service: Endoscopy;  Laterality: N/A;  . MASTECTOMY  2001   Left;modified radical with lymph node dissection  . PORT-A-CATH REMOVAL     pac in and out 2002    FAMILY HISTORY Family History  Problem Relation Age of Onset  . Cancer Sister        lung  . Cancer Brother        lipoma  . Cancer Brother        kidney    SOCIAL HISTORY Social History   Tobacco Use  . Smoking status: Never Smoker  . Smokeless tobacco: Never Used  Vaping Use  . Vaping Use: Never used  Substance Use Topics  . Alcohol use: No    Alcohol/week: 0.0 standard drinks  . Drug use: No         OPHTHALMIC EXAM: Base Eye Exam    Visual Acuity (ETDRS)      Right Left   Dist [place] 20/50ecc +1 20/40 -1   Dist ph [place] NI        Tonometry (Tonopen, 8:04 AM)      Right Left   Pressure 21 16       Pupils      Dark Light Shape React APD   Right 5 4 Round Slow None   Left 5 3 Round Brisk None       Visual Fields      Left Right   Restrictions  Partial outer superior nasal deficiency       Extraocular Movement      Right Left    Full Full       Neuro/Psych    Oriented x3: Yes   Mood/Affect: Normal       Dilation    Left eye: 1.0% Mydriacyl, 2.5% Phenylephrine @ 8:05 AM        Slit Lamp and Fundus Exam    External Exam      Right Left   External Normal  Normal       Slit Lamp Exam      Right Left   Lids/Lashes Normal  Normal   Conjunctiva/Sclera White and quiet White and quiet   Cornea Clear Clear   Anterior Chamber Deep and quiet Deep and quiet   Iris Round and reactive Round and reactive   Lens Posterior chamber intraocular lens Posterior chamber intraocular lens   Anterior Vitreous Normal Normal       Fundus Exam      Right Left   Posterior Vitreous clear , vitrectomized Posterior vitreous detachment   Disc  Normal   C/D Ratio  0.3   Macula  Disciform scar, [place], no exudates, no hemorrhage, no macular thickening, Mottling, Retinal pigment epithelial mottling, Atrophy, Retinal pigment epithelial atrophy, Age related macular degeneration, Early age related macular degeneration   Vessels  Normal   Periphery  Normal, no holes          IMAGING AND PROCEDURES  Imaging and Procedures for 04/29/20  OCT, Retina - OU - Both Eyes       Right Eye Quality was good. Scan locations included subfoveal. Central Foveal Thickness: 349. Progression has improved. Findings include abnormal foveal contour, choroidal neovascular membrane.   Left Eye Quality was good. Scan locations included subfoveal. Central Foveal Thickness: 254. Progression has worsened. Findings include cystoid macular edema, abnormal foveal contour, choroidal neovascular membrane.        Intravitreal Injection, Pharmacologic Agent - OS - Left Eye       Time Out 04/29/2020. 8:52 AM. Confirmed correct patient, procedure, site, and patient consented.   Anesthesia Topical anesthesia was used. Anesthetic medications included Akten 3.5%.   Procedure Preparation included Ofloxacin , Tobramycin 0.3%, 10% betadine to eyelids, 5% betadine to ocular surface. A supplied needle was used.   Injection:  1.25 mg Bevacizumab (AVASTIN) SOLN   NDC: 70360-001-02, Lot: [id number]   Route: Intravitreal, Site: Left Eye, Waste: 0 mg  Post-op Post injection exam found visual  acuity of at least counting fingers. The patient tolerated the procedure well. There were no complications. The patient received written and verbal post procedure care education. Post injection medications were not given.                 ASSESSMENT/PLAN:  Advanced nonexudative age-related macular degeneration of right eye with subfoveal involvement Stable OD, will continue to monitor and follow-up OD as scheduled  Exudative age-related macular degeneration of left eye with active choroidal neovascularization ([place]) OS with recurrent CME at the 8-week follow-up interval, will repeat injection Avastin OS today and examination in 7 weeks      ICD-10-CM   1. Exudative age-related macular degeneration of left eye with active choroidal neovascularization (HCC)  H35.3221 OCT, Retina - OU - Both Eyes    Intravitreal Injection, Pharmacologic Agent - OS - Left Eye    Bevacizumab (AVASTIN) SOLN 1.25 mg  2. Advanced nonexudative age-related macular degeneration of right eye with subfoveal involvement  H35.3114     1.  OS, with slightly increased activity centrally with CME at 8-week interval.  We will repeat injection Avastin today and follow-up examination left eye in 7 weeks  2.  OD, follow-up examination as scheduled  3.  Ophthalmic Meds Ordered this visit:  Meds ordered this encounter  Medications  . Bevacizumab (AVASTIN) SOLN 1.25 mg       Return in about 7 weeks (around 06/17/2020) for dilate, OS, AVASTIN OCT.  There are no Patient Instructions on file for this visit.   Explained the diagnoses, plan, and follow up with the patient and they expressed understanding.  Patient expressed  understanding of the importance of proper follow up care.   Clent Demark Lilyan Prete M.D. Diseases & Surgery of the Retina and Vitreous Retina & Diabetic [place] 04/29/20     Abbreviations: M myopia (nearsighted); A astigmatism; H hyperopia (farsighted); P presbyopia; Mrx spectacle prescription;   CTL contact lenses; OD right eye; OS left eye; OU both eyes  XT exotropia; ET esotropia; PEK punctate epithelial keratitis; PEE punctate epithelial erosions; DES dry eye syndrome; MGD meibomian gland dysfunction; ATs artificial tears; PFAT's preservative free artificial tears; [place] nuclear sclerotic cataract; PSC posterior subcapsular cataract; ERM epi-retinal membrane; PVD posterior vitreous detachment; RD retinal detachment; DM diabetes mellitus; DR diabetic retinopathy; NPDR non-proliferative diabetic retinopathy; PDR proliferative diabetic retinopathy; CSME clinically significant macular edema; DME diabetic macular edema; dbh dot blot hemorrhages; CWS cotton wool spot; POAG primary open angle glaucoma; C/D cup-to-disc ratio; HVF humphrey visual field; GVF goldmann visual field; OCT optical coherence tomography; IOP intraocular pressure; BRVO Branch retinal vein occlusion; CRVO central retinal vein occlusion; CRAO central retinal artery occlusion; BRAO branch retinal artery occlusion; RT retinal tear; SB scleral buckle; PPV pars plana vitrectomy; VH Vitreous hemorrhage; PRP panretinal laser photocoagulation; IVK intravitreal kenalog; VMT vitreomacular traction; MH Macular hole;  NVD neovascularization of the disc; NVE neovascularization elsewhere; AREDS age related eye disease study; ARMD age related macular degeneration; POAG primary open angle glaucoma; EBMD epithelial/anterior basement membrane dystrophy; ACIOL anterior chamber intraocular lens; IOL intraocular lens; PCIOL posterior chamber intraocular lens; Phaco/IOL phacoemulsification with intraocular lens placement; [place] photorefractive keratectomy; LASIK laser assisted in situ keratomileusis; HTN hypertension; DM diabetes mellitus; COPD chronic obstructive pulmonary disease

## 2020-05-04 ENCOUNTER — Encounter (INDEPENDENT_AMBULATORY_CARE_PROVIDER_SITE_OTHER): Payer: Self-pay | Admitting: Ophthalmology

## 2020-05-04 ENCOUNTER — Ambulatory Visit (INDEPENDENT_AMBULATORY_CARE_PROVIDER_SITE_OTHER): Payer: PPO | Admitting: Ophthalmology

## 2020-05-04 ENCOUNTER — Other Ambulatory Visit: Payer: Self-pay

## 2020-05-04 DIAGNOSIS — H353211 Exudative age-related macular degeneration, right eye, with active choroidal neovascularization: Secondary | ICD-10-CM | POA: Diagnosis not present

## 2020-05-04 DIAGNOSIS — H35721 Serous detachment of retinal pigment epithelium, right eye: Secondary | ICD-10-CM | POA: Diagnosis not present

## 2020-05-04 MED ORDER — BEVACIZUMAB CHEMO INJECTION 1.25MG/0.05ML SYRINGE FOR KALEIDOSCOPE
1.2500 mg | INTRAVITREAL | Status: AC | PRN
  Administered 2020-05-04: 1.25 mg via INTRAVITREAL

## 2020-05-04 NOTE — Assessment & Plan Note (Signed)
Component of wet ARMD with RPE rip.

## 2020-05-04 NOTE — Patient Instructions (Signed)
Patient instructed to notify the office promptly if new onset visual acuity decline or distortions

## 2020-05-04 NOTE — Progress Notes (Signed)
05/04/2020     CHIEF COMPLAINT Patient presents for Retina Follow Up   HISTORY OF PRESENT ILLNESS: [name] is a 86 y.o. female who presents to the clinic today for:   HPI    Retina Follow Up    Patient presents with  Wet AMD.  In right eye.  Severity is moderate.  Duration of 7 weeks.  Since onset it is stable.          Comments    7 Week Wet AMD f\u OD. Possible Avastin OD. OCT  Pt states after inj the eye becomes very irritated and feels like something is in it. Denies any other complaints.       Last edited by Tilda Franco on 05/04/2020  8:26 AM. (History)      Referring physician: Asencion Noble, MD [address] [place],   [address]  HISTORICAL INFORMATION:   Selected notes from the MEDICAL RECORD NUMBER       CURRENT MEDICATIONS: Current Outpatient Medications (Ophthalmic Drugs)  Medication Sig  . dorzolamide-timolol (COSOPT) 22.3-6.8 MG/ML ophthalmic solution Place 1 drop into the right eye 2 (two) times daily.   . Latanoprostene Bunod (VYZULTA) 0.024 % SOLN Place 1 drop into the right eye at bedtime.    No current facility-administered medications for this visit. (Ophthalmic Drugs)   Current Outpatient Medications (Other)  Medication Sig  . carvedilol (COREG) 12.5 MG tablet Take 12.5 mg by mouth 2 (two) times daily with a meal.   . ELIQUIS 5 MG TABS tablet TAKE (1) TABLET BY MOUTH TWICE DAILY FOR BLOOD THINNER.  . furosemide (LASIX) 40 MG tablet TAKE ONE TABLET BY MOUTH ONCE DAILY.  Marland Kitchen losartan (COZAAR) 25 MG tablet Take 1 tablet (25 mg total) by mouth 2 (two) times daily.  . Multiple Vitamins-Minerals (VISION FORMULA EYE HEALTH PO) Take 1 tablet by mouth 2 (two) times daily.   Marland Kitchen OVER THE COUNTER MEDICATION Take 1 capsule by mouth daily. Vision Health eye supplement  . pantoprazole (PROTONIX) 40 MG tablet Take 1 tablet (40 mg total) by mouth daily.   No current facility-administered medications for this visit. (Other)       REVIEW OF SYSTEMS:    ALLERGIES Allergies  Allergen Reactions  . Atorvastatin Other (See Comments)    Myalgias  . Hydrocodone Other (See Comments)    GI distress.    PAST MEDICAL HISTORY Past Medical History:  Diagnosis Date  . Anxiety   . Arteriosclerotic cardiovascular disease (ASCVD)     cath Feb '07- no obstructive dz - 20% proximal LAD only; EF 65%; possible coronary artery spasm  . Atrial fibrillation (HCC)    Paroxysmal on event recorder in 2009; regular supraventricular tachycardia at a rate of 150 also identified on that study representing either atrial flutter or PSVT; onset of persistent AF in 03/2011  . Carcinoma of breast ([place]) 2001   Left mastectomy with positive nodes  . Chest pain    Long-standing and atypical  . Chronic anticoagulation 04/12/2011  . GERD (gastroesophageal reflux disease)    hiatal hernia  . Herpes zoster   . Hyperlipidemia    Lipid profile in 11/2008:282, 176, 64, 201  . Hypertension    diastolic dysfunction; normal CMet and TSH in 11/2009; normal CBC in 04/2010  . Malignant neoplasm of breast (female), unspecified site 05/27/2013   right breast lumpectomy with positive node  . Peripheral neuropathy   . Polio 1946   at age 21  .  Rectal bleed   . Renal insufficiency   . Shortness of breath dyspnea   . Vitreous hemorrhage of right eye ([place]) 10/23/2019   Past Surgical History:  Procedure Laterality Date  . BREAST LUMPECTOMY WITH NEEDLE LOCALIZATION AND AXILLARY SENTINEL LYMPH NODE BX Right 05/27/2013   Procedure: BREAST LUMPECTOMY WITH NEEDLE LOCALIZATION AND AXILLARY SENTINEL LYMPH NODE BX;  Surgeon: Pedro Earls, MD;  Location: [facility];  Service: General;  Laterality: Right;  . BREAST SURGERY    . CHOLECYSTECTOMY  1999  . COLONOSCOPY  02/22/2011   Procedure: COLONOSCOPY;  Surgeon: Rogene Houston, MD;  [facility];  Service: Endoscopy;  Laterality: N/A;; performed for scant hematochezia  .  DILATION AND CURETTAGE OF UTERUS    . ESOPHAGOGASTRODUODENOSCOPY  02/22/2011   Procedure: ESOPHAGOGASTRODUODENOSCOPY (EGD);  Surgeon: Rogene Houston, MD;  [facility];  Service: Endoscopy;  Laterality: N/A;  . ESOPHAGOGASTRODUODENOSCOPY N/A 06/02/2014   Procedure: ESOPHAGOGASTRODUODENOSCOPY (EGD);  Surgeon: Rogene Houston, MD;  [facility];  Service: Endoscopy;  Laterality: N/A;  730  . EYE SURGERY     both cataracts  . MALONEY DILATION N/A 06/02/2014   Procedure: Venia Minks DILATION;  Surgeon: Rogene Houston, MD;  [facility];  Service: Endoscopy;  Laterality: N/A;  . MASTECTOMY  2001   Left;modified radical with lymph node dissection  . PORT-A-CATH REMOVAL     pac in and out 2002    FAMILY HISTORY Family History  Problem Relation Age of Onset  . Cancer Sister        lung  . Cancer Brother        lipoma  . Cancer Brother        kidney    SOCIAL HISTORY Social History   Tobacco Use  . Smoking status: Never Smoker  . Smokeless tobacco: Never Used  Vaping Use  . Vaping Use: Never used  Substance Use Topics  . Alcohol use: No    Alcohol/week: 0.0 standard drinks  . Drug use: No         OPHTHALMIC EXAM:  Base Eye Exam    Visual Acuity (Snellen - Linear)      Right Left   Dist [place] 20/60 -2 20/50 +   Dist ph [place] NI NI       Tonometry (Tonopen, 8:30 AM)      Right Left   Pressure 17 17       Pupils      Pupils Dark Light Shape React APD   Right PERRL 5 4 Round Slow None   Left PERRL 5 4 Round Slow None       Visual Fields    Counting fingers       Neuro/Psych    Oriented x3: Yes   Mood/Affect: Normal       Dilation    Right eye: 1.0% Mydriacyl, 2.5% Phenylephrine @ 8:30 AM        Slit Lamp and Fundus Exam    External Exam      Right Left   External Normal Normal       Slit Lamp Exam      Right Left   Lids/Lashes Normal Normal   Conjunctiva/Sclera White and quiet White and quiet   Cornea Clear Clear    Anterior Chamber Deep and quiet Deep and quiet   Iris Round and reactive Round and reactive   Lens Posterior chamber intraocular lens Posterior chamber intraocular lens  Anterior Vitreous Normal Normal       Fundus Exam      Right Left   Posterior Vitreous Vitrectomized    Disc Normal    C/D Ratio 0.45    Macula Retinal pigment epithelial mottling, Retinal pigment epithelial detachment, Macular thickening, Retinal pigment epithelial atrophy signifying old RPE rip temporal to fovea subfoveal.    Vessels Normal    Periphery Normal           IMAGING AND PROCEDURES  Imaging and Procedures for 05/04/20  OCT, Retina - OU - Both Eyes       Right Eye Quality was good. Scan locations included subfoveal. Central Foveal Thickness: 274. Progression has been stable. Findings include abnormal foveal contour, pigment epithelial detachment, subretinal fluid.   Left Eye Quality was good. Scan locations included subfoveal. Central Foveal Thickness: 247. Progression has improved. Findings include intraretinal fluid, abnormal foveal contour, outer retinal atrophy, central retinal atrophy.   Notes OS 5 days status post injection Avastin, much less but still active intraretinal fluid left eye  OD, persistent RPE rip, folded over PED, intraretinal fluid in the subfoveal location Associated clinically with.intraretinal hemorrhage.  Stable by OCT today       Intravitreal Injection, Pharmacologic Agent - OD - Right Eye       Time Out 05/04/2020. 9:14 AM. Confirmed correct patient, procedure, site, and patient consented.   Anesthesia Topical anesthesia was used. Anesthetic medications included [place] 3.5%.   Procedure Preparation included Tobramycin 0.3%, 10% betadine to eyelids, Ofloxacin . A 30 gauge needle was used.   Injection:  1.25 mg Bevacizumab (AVASTIN) SOLN   NDC: 70360-001-02, Lot: [id number]   Route: Intravitreal, Site: Right Eye, Waste: 0 mg  Post-op Post injection exam found  visual acuity of at least counting fingers. The patient tolerated the procedure well. There were no complications. The patient received written and verbal post procedure care education. Post injection medications were not given.                 ASSESSMENT/PLAN:  Exudative age-related macular degeneration of right eye with active choroidal neovascularization (HCC) OD, with RPE rip, folded PED, intraretinal hemorrhage subfoveal location and thickening, repeat intravitreal Avastin today to maintain, currently at 7-week interval  Macular pigment epithelial detachment, right Component of wet ARMD with RPE rip.      ICD-10-CM   1. Exudative age-related macular degeneration of right eye with active choroidal neovascularization (HCC)  H35.3211 OCT, Retina - OU - Both Eyes    Intravitreal Injection, Pharmacologic Agent - OD - Right Eye    Bevacizumab (AVASTIN) SOLN 1.25 mg  2. Macular pigment epithelial detachment, right  H35.721     1.  Repeat intravitreal Avastin today at 7 weeks and examination right eye in 7 weeks  2.  OS as scheduled  3.  Ophthalmic Meds Ordered this visit:  Meds ordered this encounter  Medications  . Bevacizumab (AVASTIN) SOLN 1.25 mg       Return in about 7 weeks (around 06/22/2020) for dilate, OD, AVASTIN OCT.  Patient Instructions  Patient instructed to notify the office promptly if new onset visual acuity decline or distortions    Explained the diagnoses, plan, and follow up with the patient and they expressed understanding.  Patient expressed understanding of the importance of proper follow up care.   Clent Demark Krista Godsil M.D. Diseases & Surgery of the Retina and Vitreous [facility] [place] 05/04/20     Abbreviations: M myopia (nearsighted);  A astigmatism; H hyperopia (farsighted); P presbyopia; Mrx spectacle prescription;  CTL contact lenses; OD right eye; OS left eye; OU both eyes  XT exotropia; ET esotropia; PEK punctate epithelial  keratitis; PEE punctate epithelial erosions; DES dry eye syndrome; MGD meibomian gland dysfunction; ATs artificial tears; PFAT's preservative free artificial tears; [place] nuclear sclerotic cataract; PSC posterior subcapsular cataract; ERM epi-retinal membrane; PVD posterior vitreous detachment; RD retinal detachment; DM diabetes mellitus; DR diabetic retinopathy; NPDR non-proliferative diabetic retinopathy; PDR proliferative diabetic retinopathy; CSME clinically significant macular edema; DME diabetic macular edema; dbh dot blot hemorrhages; CWS cotton wool spot; POAG primary open angle glaucoma; C/D cup-to-disc ratio; HVF humphrey visual field; GVF goldmann visual field; OCT optical coherence tomography; IOP intraocular pressure; BRVO Branch retinal vein occlusion; CRVO central retinal vein occlusion; CRAO central retinal artery occlusion; BRAO branch retinal artery occlusion; RT retinal tear; SB scleral buckle; PPV pars plana vitrectomy; VH Vitreous hemorrhage; PRP panretinal laser photocoagulation; IVK intravitreal kenalog; VMT vitreomacular traction; MH Macular hole;  NVD neovascularization of the disc; NVE neovascularization elsewhere; AREDS age related eye disease study; ARMD age related macular degeneration; POAG primary open angle glaucoma; EBMD epithelial/anterior basement membrane dystrophy; ACIOL anterior chamber intraocular lens; IOL intraocular lens; PCIOL posterior chamber intraocular lens; Phaco/IOL phacoemulsification with intraocular lens placement; [place] photorefractive keratectomy; LASIK laser assisted in situ keratomileusis; HTN hypertension; DM diabetes mellitus; COPD chronic obstructive pulmonary disease

## 2020-05-04 NOTE — Assessment & Plan Note (Signed)
OD, with RPE rip, folded PED, intraretinal hemorrhage subfoveal location and thickening, repeat intravitreal Avastin today to maintain, currently at 7-week interval

## 2020-05-18 ENCOUNTER — Other Ambulatory Visit (INDEPENDENT_AMBULATORY_CARE_PROVIDER_SITE_OTHER): Payer: Self-pay | Admitting: Gastroenterology

## 2020-05-18 NOTE — Telephone Encounter (Signed)
Last seen 07/09/2019 for [name] by Merrilyn Puma

## 2020-05-19 ENCOUNTER — Other Ambulatory Visit (HOSPITAL_COMMUNITY): Payer: Self-pay | Admitting: Internal Medicine

## 2020-05-19 DIAGNOSIS — Z1231 Encounter for screening mammogram for malignant neoplasm of breast: Secondary | ICD-10-CM

## 2020-05-26 ENCOUNTER — Ambulatory Visit (INDEPENDENT_AMBULATORY_CARE_PROVIDER_SITE_OTHER): Payer: PPO

## 2020-05-26 ENCOUNTER — Ambulatory Visit (INDEPENDENT_AMBULATORY_CARE_PROVIDER_SITE_OTHER): Payer: PPO | Admitting: Orthopaedic Surgery

## 2020-05-26 ENCOUNTER — Encounter: Payer: Self-pay | Admitting: Orthopaedic Surgery

## 2020-05-26 ENCOUNTER — Other Ambulatory Visit: Payer: Self-pay

## 2020-05-26 VITALS — Ht 62.5 in | Wt 178.0 lb

## 2020-05-26 DIAGNOSIS — M7062 Trochanteric bursitis, left hip: Secondary | ICD-10-CM | POA: Diagnosis not present

## 2020-05-26 DIAGNOSIS — M25562 Pain in left knee: Secondary | ICD-10-CM | POA: Diagnosis not present

## 2020-05-26 DIAGNOSIS — M170 Bilateral primary osteoarthritis of knee: Secondary | ICD-10-CM

## 2020-05-26 DIAGNOSIS — M25552 Pain in left hip: Secondary | ICD-10-CM

## 2020-05-26 DIAGNOSIS — G8929 Other chronic pain: Secondary | ICD-10-CM | POA: Diagnosis not present

## 2020-05-26 MED ORDER — BUPIVACAINE HCL 0.5 % IJ SOLN
2.0000 mL | INTRAMUSCULAR | Status: AC | PRN
  Administered 2020-05-26: 2 mL via INTRA_ARTICULAR

## 2020-05-26 MED ORDER — LIDOCAINE HCL 1 % IJ SOLN
2.0000 mL | INTRAMUSCULAR | Status: AC | PRN
  Administered 2020-05-26: 2 mL

## 2020-05-26 NOTE — Progress Notes (Signed)
Office Visit Note   Patient: [name]           Date of Birth: 1932/05/08           MRN: [id number] Visit Date: 05/26/2020              Requested by: Asencion Noble, MD [address] [place],  [place] [address] PCP: Asencion Noble, MD   Assessment & Plan: Visit Diagnoses:  1. Pain in left hip   2. Chronic pain of left knee   3. Bilateral primary osteoarthritis of knee   4. Trochanteric bursitis, left hip     Plan: [name] is been complaining of left hip pain and on and off for "years". She is not had any groin pain. Does have history of chronic low back pain but thinks this may be little different. Pain does "travel down to her knee where she does have some localized tenderness along the medial compartment. No numbness or tingling. Trays of her pelvis were negative. Films of both knees demonstrated significant medial compartment arthritis. She is having more pain over the greater trochanter which probably represents bursitis will inject with betamethasone and monitor her response  Follow-Up Instructions: Return if symptoms worsen or fail to improve.   Orders:  Orders Placed This Encounter  Procedures  . Large Joint Inj: L greater trochanter  . XR Pelvis 1-2 Views  . XR Knee 1-2 Views Left   No orders of the defined types were placed in this encounter.     Procedures: Large Joint Inj: L greater trochanter on 05/26/2020 2:52 PM Indications: pain and diagnostic evaluation Details: 25 G 1.5 in needle, lateral approach  Arthrogram: No  Medications: 2 mL lidocaine 1 %; 2 mL bupivacaine 0.5 %  Milligrams betamethasone injected greater trochanter left hip Procedure, treatment alternatives, risks and benefits explained, specific risks discussed. Consent was given by the patient. Immediately prior to procedure a time out was called to verify the correct patient, procedure, equipment, support staff and site/side marked as required. Patient was prepped and draped in the  usual sterile fashion.       Clinical Data: No additional findings.   Subjective: Chief Complaint  Patient presents with  . Left Knee - Pain  . Left Hip - Pain  Patient presents today for left hip and left knee pain. She said that she has had this pain in her hip on and off for years. The pain is located "in the joint" and travels down to her knee. No groin pain. She does have some lower back pain that comes and goes as well. No numbness or tingling in her lower extremities. She takes Tylenol as needed. She also has complaints of medial left knee pain. She said that it is difficult to fully extend her leg. It grinds when she moves her knee and occasionally gives way.   HPI  Review of Systems   Objective: Vital Signs: Ht 5' 2.5" (1.588 m)   Wt 178 lb (80.7 kg)   BMI 32.04 kg/m   Physical Exam Constitutional:      Appearance: She is well-developed.  Eyes:     Pupils: Pupils are equal, round, and reactive to light.  Pulmonary:     Effort: Pulmonary effort is normal.  Skin:    General: Skin is warm and dry.  Neurological:     Mental Status: She is alert and oriented to person, place, and time.  Psychiatric:  Behavior: Behavior normal.     Ortho Exam alert and oriented x3. Nicely dressed and comfortable. Normally walks with a cane but she "forgot it today". Has had some issues with balance. Has local tenderness directly over the tip of the greater trochanter left hip without any skin changes. Painless range of motion with internal and external rotation. Straight leg raise negative. Does lack full extension of the left knee. No effusion. Slight increased varus with minimal medial joint pain. Some patellar crepitation but no pain with compression and no pain laterally. No popliteal pain or mass. Exam intact  Specialty Comments:  No specialty comments available.  Imaging: XR Knee 1-2 Views Left  Result Date: 05/26/2020 Of the left knee obtained in 3 projections  standing. There is significant narrowing of the medial joint space associated with small peripheral osteophytes and subchondral sclerosis. No ectopic calcification. Mild subchondral sclerosis in the lateral compartment with minimal narrowing. Consistent with advanced osteoarthritis.  XR Pelvis 1-2 Views  Result Date: 05/26/2020 [facility] the pelvis did not demonstrate any acute changes. Patient's been experiencing some pain over the greater trochanteric region. There is a little irregularity of the bony contour but no ectopic calcification. No obvious arthritis of the hip joint or obvious fracture    PMFS History: Patient Active Problem List   Diagnosis Date Noted  . Trochanteric bursitis, left hip 05/26/2020  . Macular pigment epithelial detachment, right 05/04/2020  . Advanced nonexudative age-related macular degeneration of right eye with subfoveal involvement 11/03/2019  . Follow-up examination after eye surgery 10/23/2019  . Exudative age-related macular degeneration of right eye with active choroidal neovascularization ([facility]) 10/23/2019  . Exudative age-related macular degeneration of left eye with active choroidal neovascularization ([facility]) 10/23/2019  . Posterior vitreous detachment of left eye 10/23/2019  . Bilateral primary osteoarthritis of knee 04/10/2018  . Pain of left hip joint 04/10/2018  . Acute on chronic systolic CHF (congestive heart failure) ([facility]) 07/05/2017  . Influenza-like illness 07/05/2017  . Prolonged QT interval 07/05/2017  . Malignant neoplasm of upper-inner quadrant of left breast in female, estrogen receptor positive ([facility]) 06/05/2017  . Lower extremity edema 05/03/2016  . Early satiety 05/07/2014  . Dysphagia, pharyngoesophageal phase 05/07/2014  . Cough 03/09/2014  . Malignant neoplasm of upper-inner quadrant of right breast in female, estrogen receptor positive ([facility]) 06/25/2013  . Bilateral breast cancer ([facility]) 05/09/2013  . Wrist fracture, closed 12/10/2012  .  Triquetral fracture 10/29/2012  . Sprain of right wrist 10/29/2012  . Chronic anticoagulation 04/12/2011  . Atrial fibrillation ([facility]) 03/22/2011  . Arteriosclerotic cardiovascular disease (ASCVD)   . Hyperlipidemia   . Polio   . Hypertension   . GERD (gastroesophageal reflux disease)   . HERPES ZOSTER 11/12/2008  . ANXIETY 07/24/2007  . PERIPHERAL NEUROPATHY 07/24/2007   Past Medical History:  Diagnosis Date  . Anxiety   . Arteriosclerotic cardiovascular disease (ASCVD)     cath Feb '07- no obstructive dz - 20% proximal LAD only; EF 65%; possible coronary artery spasm  . Atrial fibrillation (HCC)    Paroxysmal on event recorder in 2009; regular supraventricular tachycardia at a rate of 150 also identified on that study representing either atrial flutter or PSVT; onset of persistent AF in 03/2011  . Carcinoma of breast ([facility]) 2001   Left mastectomy with positive nodes  . Chest pain    Long-standing and atypical  . Chronic anticoagulation 04/12/2011  . GERD (gastroesophageal reflux disease)    hiatal hernia  . Herpes zoster   .  Hyperlipidemia    Lipid profile in 11/2008:282, 176, 64, 201  . Hypertension    diastolic dysfunction; normal CMet and TSH in 11/2009; normal CBC in 04/2010  . Malignant neoplasm of breast (female), unspecified site 05/27/2013   right breast lumpectomy with positive node  . Peripheral neuropathy   . Polio 1946   at age 35  . Rectal bleed   . Renal insufficiency   . Shortness of breath dyspnea   . Vitreous hemorrhage of right eye ([facility]) 10/23/2019    Family History  Problem Relation Age of Onset  . Cancer Sister        lung  . Cancer Brother        lipoma  . Cancer Brother        kidney    Past Surgical History:  Procedure Laterality Date  . BREAST LUMPECTOMY WITH NEEDLE LOCALIZATION AND AXILLARY SENTINEL LYMPH NODE BX Right 05/27/2013   Procedure: BREAST LUMPECTOMY WITH NEEDLE LOCALIZATION AND AXILLARY SENTINEL LYMPH NODE BX;  Surgeon: Pedro Earls, MD;  Location: [facility];  Service: General;  Laterality: Right;  . BREAST SURGERY    . CHOLECYSTECTOMY  1999  . COLONOSCOPY  02/22/2011   Procedure: COLONOSCOPY;  Surgeon: Rogene Houston, MD;  [facility];  Service: Endoscopy;  Laterality: N/A;; performed for scant hematochezia  . DILATION AND CURETTAGE OF UTERUS    . ESOPHAGOGASTRODUODENOSCOPY  02/22/2011   Procedure: ESOPHAGOGASTRODUODENOSCOPY (EGD);  Surgeon: Rogene Houston, MD;  [facility];  Service: Endoscopy;  Laterality: N/A;  . ESOPHAGOGASTRODUODENOSCOPY N/A 06/02/2014   Procedure: ESOPHAGOGASTRODUODENOSCOPY (EGD);  Surgeon: Rogene Houston, MD;  [facility];  Service: Endoscopy;  Laterality: N/A;  730  . EYE SURGERY     both cataracts  . MALONEY DILATION N/A 06/02/2014   Procedure: Venia Minks DILATION;  Surgeon: Rogene Houston, MD;  [facility];  Service: Endoscopy;  Laterality: N/A;  . MASTECTOMY  2001   Left;modified radical with lymph node dissection  . PORT-A-CATH REMOVAL     pac in and out 2002   Social History   Occupational History  . Occupation: homemaker  Tobacco Use  . Smoking status: Never Smoker  . Smokeless tobacco: Never Used  Vaping Use  . Vaping Use: Never used  Substance and Sexual Activity  . Alcohol use: No    Alcohol/week: 0.0 standard drinks  . Drug use: No  . Sexual activity: Not Currently    Birth control/protection: Post-menopausal

## 2020-06-17 ENCOUNTER — Ambulatory Visit (INDEPENDENT_AMBULATORY_CARE_PROVIDER_SITE_OTHER): Payer: PPO | Admitting: Ophthalmology

## 2020-06-17 ENCOUNTER — Encounter (INDEPENDENT_AMBULATORY_CARE_PROVIDER_SITE_OTHER): Payer: Self-pay | Admitting: Ophthalmology

## 2020-06-17 ENCOUNTER — Other Ambulatory Visit: Payer: Self-pay

## 2020-06-17 DIAGNOSIS — H353221 Exudative age-related macular degeneration, left eye, with active choroidal neovascularization: Secondary | ICD-10-CM

## 2020-06-17 DIAGNOSIS — H43812 Vitreous degeneration, left eye: Secondary | ICD-10-CM | POA: Diagnosis not present

## 2020-06-17 DIAGNOSIS — H353211 Exudative age-related macular degeneration, right eye, with active choroidal neovascularization: Secondary | ICD-10-CM

## 2020-06-17 MED ORDER — BEVACIZUMAB 2.5 MG/0.1ML IZ SOSY
2.5000 mg | PREFILLED_SYRINGE | INTRAVITREAL | Status: AC | PRN
  Administered 2020-06-17: 2.5 mg via INTRAVITREAL

## 2020-06-17 NOTE — Assessment & Plan Note (Signed)
OD, scheduled soon for follow-up examination, still with active vascularized pigment epithelial detachment

## 2020-06-17 NOTE — Progress Notes (Signed)
06/17/2020     CHIEF COMPLAINT Patient presents for Retina Follow Up   HISTORY OF PRESENT ILLNESS: [name] is a 86 y.o. female who presents to the clinic today for:   HPI    Retina Follow Up    Patient presents with  Wet AMD.  In left eye.  Severity is moderate.  Duration of 7 weeks.  Since onset it is stable.  I, the attending physician,  performed the HPI with the patient and updated documentation appropriately.          Comments    7 Week Wet AMD f\u OS. Possible Avastin OS. OCT  Pt states vision is about the same. Denies complaints.       Last edited by Tilda Franco on 06/17/2020  8:10 AM. (History)      Referring physician: Asencion Noble, MD [address] [place],  [place] [address]  HISTORICAL INFORMATION:   Selected notes from the MEDICAL RECORD NUMBER       CURRENT MEDICATIONS: Current Outpatient Medications (Ophthalmic Drugs)  Medication Sig  . dorzolamide-timolol (COSOPT) 22.3-6.8 MG/ML ophthalmic solution Place 1 drop into the right eye 2 (two) times daily.   . Latanoprostene Bunod (VYZULTA) 0.024 % SOLN Place 1 drop into the right eye at bedtime.    No current facility-administered medications for this visit. (Ophthalmic Drugs)   Current Outpatient Medications (Other)  Medication Sig  . carvedilol (COREG) 12.5 MG tablet Take 12.5 mg by mouth 2 (two) times daily with a meal.   . ELIQUIS 5 MG TABS tablet TAKE (1) TABLET BY MOUTH TWICE DAILY FOR BLOOD THINNER.  . furosemide (LASIX) 40 MG tablet TAKE ONE TABLET BY MOUTH ONCE DAILY.  Marland Kitchen losartan (COZAAR) 25 MG tablet Take 1 tablet (25 mg total) by mouth 2 (two) times daily.  . Multiple Vitamins-Minerals (VISION FORMULA EYE HEALTH PO) Take 1 tablet by mouth 2 (two) times daily.   Marland Kitchen OVER THE COUNTER MEDICATION Take 1 capsule by mouth daily. Vision Health eye supplement  . pantoprazole (PROTONIX) 40 MG tablet TAKE ONE TABLET BY MOUTH DAILY BEFORE BREAKFAST FOR ACID REFLUX.   No current  facility-administered medications for this visit. (Other)      REVIEW OF SYSTEMS:    ALLERGIES Allergies  Allergen Reactions  . Atorvastatin Other (See Comments)    Myalgias  . Hydrocodone Other (See Comments)    GI distress.    PAST MEDICAL HISTORY Past Medical History:  Diagnosis Date  . Anxiety   . Arteriosclerotic cardiovascular disease (ASCVD)     cath Feb '07- no obstructive dz - 20% proximal LAD only; EF 65%; possible coronary artery spasm  . Atrial fibrillation (HCC)    Paroxysmal on event recorder in 2009; regular supraventricular tachycardia at a rate of 150 also identified on that study representing either atrial flutter or PSVT; onset of persistent AF in 03/2011  . Carcinoma of breast ([place]) 2001   Left mastectomy with positive nodes  . Chest pain    Long-standing and atypical  . Chronic anticoagulation 04/12/2011  . GERD (gastroesophageal reflux disease)    hiatal hernia  . Herpes zoster   . Hyperlipidemia    Lipid profile in 11/2008:282, 176, 64, 201  . Hypertension    diastolic dysfunction; normal CMet and TSH in 11/2009; normal CBC in 04/2010  . Malignant neoplasm of breast (female), unspecified site 05/27/2013   right breast lumpectomy with positive node  . Peripheral neuropathy   . Polio 1946  at age 32  . Rectal bleed   . Renal insufficiency   . Shortness of breath dyspnea   . Vitreous hemorrhage of right eye ([place]) 10/23/2019   Past Surgical History:  Procedure Laterality Date  . BREAST LUMPECTOMY WITH NEEDLE LOCALIZATION AND AXILLARY SENTINEL LYMPH NODE BX Right 05/27/2013   Procedure: BREAST LUMPECTOMY WITH NEEDLE LOCALIZATION AND AXILLARY SENTINEL LYMPH NODE BX;  Surgeon: Pedro Earls, MD;  Location: [facility];  Service: General;  Laterality: Right;  . BREAST SURGERY    . CHOLECYSTECTOMY  1999  . COLONOSCOPY  02/22/2011   Procedure: COLONOSCOPY;  Surgeon: Rogene Houston, MD;  [facility];  Service: Endoscopy;   Laterality: N/A;; performed for scant hematochezia  . DILATION AND CURETTAGE OF UTERUS    . ESOPHAGOGASTRODUODENOSCOPY  02/22/2011   Procedure: ESOPHAGOGASTRODUODENOSCOPY (EGD);  Surgeon: Rogene Houston, MD;  [facility];  Service: Endoscopy;  Laterality: N/A;  . ESOPHAGOGASTRODUODENOSCOPY N/A 06/02/2014   Procedure: ESOPHAGOGASTRODUODENOSCOPY (EGD);  Surgeon: Rogene Houston, MD;  [facility];  Service: Endoscopy;  Laterality: N/A;  730  . EYE SURGERY     both cataracts  . MALONEY DILATION N/A 06/02/2014   Procedure: Venia Minks DILATION;  Surgeon: Rogene Houston, MD;  [facility];  Service: Endoscopy;  Laterality: N/A;  . MASTECTOMY  2001   Left;modified radical with lymph node dissection  . PORT-A-CATH REMOVAL     pac in and out 2002    FAMILY HISTORY Family History  Problem Relation Age of Onset  . Cancer Sister        lung  . Cancer Brother        lipoma  . Cancer Brother        kidney    SOCIAL HISTORY Social History   Tobacco Use  . Smoking status: Never Smoker  . Smokeless tobacco: Never Used  Vaping Use  . Vaping Use: Never used  Substance Use Topics  . Alcohol use: No    Alcohol/week: 0.0 standard drinks  . Drug use: No         OPHTHALMIC EXAM:  Base Eye Exam    Visual Acuity (Snellen - Linear)      Right Left   Dist [place] 20/60 -2 20/50 -2   Dist ph [place] NI 20/40 -1       Tonometry (Tonopen, 8:15 AM)      Right Left   Pressure 18 17       Pupils      Dark Light Shape React APD   Right 5 4 Round Slow None   Left 5 4 Round Brisk None       Visual Fields (Counting fingers)      Left Right    Full Full       Neuro/Psych    Oriented x3: Yes   Mood/Affect: Normal       Dilation    Left eye: 1.0% Mydriacyl, 2.5% Phenylephrine @ 8:15 AM        Slit Lamp and Fundus Exam    External Exam      Right Left   External Normal Normal       Slit Lamp Exam      Right Left   Lids/Lashes Normal Normal    Conjunctiva/Sclera White and quiet White and quiet   Cornea Clear Clear   Anterior Chamber Deep and quiet Deep and quiet   Iris Round and reactive Round and reactive  Lens Posterior chamber intraocular lens Posterior chamber intraocular lens   Anterior Vitreous Normal Normal       Fundus Exam      Right Left   Posterior Vitreous clear , vitrectomized Posterior vitreous detachment   Disc  Normal   C/D Ratio  0.3   Macula  Disciform scar, Drusen, no exudates, no hemorrhage, no macular thickening, Mottling, Retinal pigment epithelial mottling, Atrophy, Retinal pigment epithelial atrophy, Age related macular degeneration, Early age related macular degeneration, Geographic atrophy, Pigmented atrophy, Hard drusen,   Vessels  Normal   Periphery  Normal, no holes          IMAGING AND PROCEDURES  Imaging and Procedures for 06/17/20  OCT, Retina - OU - Both Eyes       Right Eye Quality was good. Scan locations included subfoveal. Central Foveal Thickness: 375. Progression has been stable. Findings include abnormal foveal contour, pigment epithelial detachment, subretinal fluid.   Left Eye Quality was good. Scan locations included subfoveal. Central Foveal Thickness: 242. Progression has improved. Findings include intraretinal fluid, abnormal foveal contour, outer retinal atrophy, central retinal atrophy.   Notes OS  7 weeks status post injection Avastin, much less but still active intraretinal fluid left eye, repeat injection today  OD, persistent RPE rip, folded over PED, intraretinal fluid in the subfoveal location Associated clinically with.intraretinal hemorrhage.  Stable by OCT today       Intravitreal Injection, Pharmacologic Agent - OS - Left Eye       Time Out 06/17/2020. 8:49 AM. Confirmed correct patient, procedure, site, and patient consented.   Anesthesia Topical anesthesia was used. Anesthetic medications included [place] 3.5%.   Procedure Preparation included  Ofloxacin , Tobramycin 0.3%, 10% betadine to eyelids, 5% betadine to ocular surface. A supplied needle was used.   Injection:  2.5 mg Bevacizumab (AVASTIN) 2.5mg /0.55mL SOSY   NDC: 27741-287-86, Lot: [id number]   Route: Intravitreal, Site: Left Eye  Post-op Post injection exam found visual acuity of at least counting fingers. The patient tolerated the procedure well. There were no complications. The patient received written and verbal post procedure care education. Post injection medications were not given.                 ASSESSMENT/PLAN:  Exudative age-related macular degeneration of left eye with active choroidal neovascularization (HCC) Last injection some 7 weeks previous on 04/29/2020, much less intraretinal fluid and CME post Avastin update, will repeat today and maintain 7-week follow-up to maintain  Exudative age-related macular degeneration of right eye with active choroidal neovascularization (HCC) OD, scheduled soon for follow-up examination, still with active vascularized pigment epithelial detachment  Posterior vitreous detachment of left eye The nature of wet macular degeneration was discussed with the patient.  Forms of therapy reviewed include the use of Anti-VEGF medications injected painlessly into the eye, as well as other possible treatment modalities, including thermal laser therapy. Fellow eye involvement and risks were discussed with the patient. Upon the finding of wet age related macular degeneration, treatment will be offered. The treatment regimen is on a treat as needed basis with the intent to treat if necessary and extend interval of exams when possible. On average 1 out of 6 patients do not need lifetime therapy. However, the risk of recurrent disease is high for a lifetime.  Initially monthly, then periodic, examinations and evaluations will determine whether the next treatment is required on the day of the examination.      ICD-10-CM   1. Exudative  age-related macular degeneration of left eye with active choroidal neovascularization (HCC)  H35.3221 OCT, Retina - OU - Both Eyes    Intravitreal Injection, Pharmacologic Agent - OS - Left Eye    bevacizumab (AVASTIN) SOSY 2.5 mg  2. Exudative age-related macular degeneration of right eye with active choroidal neovascularization ([place])  H35.3211   3. Posterior vitreous detachment of left eye  H43.812     1.  OS at 7-week follow-up today, much less intraretinal fluid and disease, repeat injection intravitreal Avastin today and examination again in same interval left eye  2.  Follow-up as scheduled, OD  3.  Ophthalmic Meds Ordered this visit:  Meds ordered this encounter  Medications  . bevacizumab (AVASTIN) SOSY 2.5 mg       Return in about 7 weeks (around 08/05/2020) for dilate, OS, AVASTIN OCT.  There are no Patient Instructions on file for this visit.   Explained the diagnoses, plan, and follow up with the patient and they expressed understanding.  Patient expressed understanding of the importance of proper follow up care.   Clent Demark Allix Blomquist M.D. Diseases & Surgery of the Retina and Vitreous [facility] 06/17/20     Abbreviations: M myopia (nearsighted); A astigmatism; H hyperopia (farsighted); P presbyopia; Mrx spectacle prescription;  CTL contact lenses; OD right eye; OS left eye; OU both eyes  XT exotropia; ET esotropia; PEK punctate epithelial keratitis; PEE punctate epithelial erosions; DES dry eye syndrome; MGD meibomian gland dysfunction; ATs artificial tears; PFAT's preservative free artificial tears; [place] nuclear sclerotic cataract; PSC posterior subcapsular cataract; ERM epi-retinal membrane; PVD posterior vitreous detachment; RD retinal detachment; DM diabetes mellitus; DR diabetic retinopathy; NPDR non-proliferative diabetic retinopathy; PDR proliferative diabetic retinopathy; CSME clinically significant macular edema; DME diabetic macular edema; dbh  dot blot hemorrhages; CWS cotton wool spot; POAG primary open angle glaucoma; C/D cup-to-disc ratio; HVF humphrey visual field; GVF goldmann visual field; OCT optical coherence tomography; IOP intraocular pressure; BRVO Branch retinal vein occlusion; CRVO central retinal vein occlusion; CRAO central retinal artery occlusion; BRAO branch retinal artery occlusion; RT retinal tear; SB scleral buckle; PPV pars plana vitrectomy; VH Vitreous hemorrhage; PRP panretinal laser photocoagulation; IVK intravitreal kenalog; VMT vitreomacular traction; MH Macular hole;  NVD neovascularization of the disc; NVE neovascularization elsewhere; AREDS age related eye disease study; ARMD age related macular degeneration; POAG primary open angle glaucoma; EBMD epithelial/anterior basement membrane dystrophy; ACIOL anterior chamber intraocular lens; IOL intraocular lens; PCIOL posterior chamber intraocular lens; Phaco/IOL phacoemulsification with intraocular lens placement; [place] photorefractive keratectomy; LASIK laser assisted in situ keratomileusis; HTN hypertension; DM diabetes mellitus; COPD chronic obstructive pulmonary disease

## 2020-06-17 NOTE — Assessment & Plan Note (Signed)

## 2020-06-17 NOTE — Assessment & Plan Note (Signed)
Last injection some 7 weeks previous on 04/29/2020, much less intraretinal fluid and CME post Avastin update, will repeat today and maintain 7-week follow-up to maintain

## 2020-06-22 ENCOUNTER — Encounter (INDEPENDENT_AMBULATORY_CARE_PROVIDER_SITE_OTHER): Payer: Self-pay | Admitting: Ophthalmology

## 2020-06-22 ENCOUNTER — Other Ambulatory Visit: Payer: Self-pay

## 2020-06-22 ENCOUNTER — Ambulatory Visit (INDEPENDENT_AMBULATORY_CARE_PROVIDER_SITE_OTHER): Payer: PPO | Admitting: Ophthalmology

## 2020-06-22 DIAGNOSIS — H353114 Nonexudative age-related macular degeneration, right eye, advanced atrophic with subfoveal involvement: Secondary | ICD-10-CM

## 2020-06-22 DIAGNOSIS — H35721 Serous detachment of retinal pigment epithelium, right eye: Secondary | ICD-10-CM | POA: Diagnosis not present

## 2020-06-22 DIAGNOSIS — H353211 Exudative age-related macular degeneration, right eye, with active choroidal neovascularization: Secondary | ICD-10-CM

## 2020-06-22 MED ORDER — BEVACIZUMAB 2.5 MG/0.1ML IZ SOSY
2.5000 mg | PREFILLED_SYRINGE | INTRAVITREAL | Status: AC | PRN
  Administered 2020-06-22: 2.5 mg via INTRAVITREAL

## 2020-06-22 NOTE — Assessment & Plan Note (Signed)
Stable, as component of wet ARMD

## 2020-06-22 NOTE — Progress Notes (Signed)
06/22/2020     CHIEF COMPLAINT Patient presents for Retina Follow Up   HISTORY OF PRESENT ILLNESS: [name] is a 86 y.o. female who presents to the clinic today for:   HPI    Retina Follow Up    Patient presents with  Wet AMD.  In right eye.  Severity is moderate.  Duration of 7.  Since onset it is stable.  I, the attending physician,  performed the HPI with the patient and updated documentation appropriately.          Comments    7 Week Wet AMD f\u OD. Possible Avastin OD. OCT  Pt states vision is stable. Denies complaints.       Last edited by Tilda Franco on 06/22/2020  8:14 AM. (History)      Referring physician: Asencion Noble, MD [address] [place],  [place] [address]  HISTORICAL INFORMATION:   Selected notes from the MEDICAL RECORD NUMBER       CURRENT MEDICATIONS: Current Outpatient Medications (Ophthalmic Drugs)  Medication Sig  . dorzolamide-timolol (COSOPT) 22.3-6.8 MG/ML ophthalmic solution Place 1 drop into the right eye 2 (two) times daily.   . Latanoprostene Bunod (VYZULTA) 0.024 % SOLN Place 1 drop into the right eye at bedtime.    No current facility-administered medications for this visit. (Ophthalmic Drugs)   Current Outpatient Medications (Other)  Medication Sig  . carvedilol (COREG) 12.5 MG tablet Take 12.5 mg by mouth 2 (two) times daily with a meal.   . ELIQUIS 5 MG TABS tablet TAKE (1) TABLET BY MOUTH TWICE DAILY FOR BLOOD THINNER.  . furosemide (LASIX) 40 MG tablet TAKE ONE TABLET BY MOUTH ONCE DAILY.  Marland Kitchen losartan (COZAAR) 25 MG tablet Take 1 tablet (25 mg total) by mouth 2 (two) times daily.  . Multiple Vitamins-Minerals (VISION FORMULA EYE HEALTH PO) Take 1 tablet by mouth 2 (two) times daily.   Marland Kitchen OVER THE COUNTER MEDICATION Take 1 capsule by mouth daily. Vision Health eye supplement  . pantoprazole (PROTONIX) 40 MG tablet TAKE ONE TABLET BY MOUTH DAILY BEFORE BREAKFAST FOR ACID REFLUX.   No current  facility-administered medications for this visit. (Other)      REVIEW OF SYSTEMS:    ALLERGIES Allergies  Allergen Reactions  . Atorvastatin Other (See Comments)    Myalgias  . Hydrocodone Other (See Comments)    GI distress.    PAST MEDICAL HISTORY Past Medical History:  Diagnosis Date  . Anxiety   . Arteriosclerotic cardiovascular disease (ASCVD)     cath Feb '07- no obstructive dz - 20% proximal LAD only; EF 65%; possible coronary artery spasm  . Atrial fibrillation (HCC)    Paroxysmal on event recorder in 2009; regular supraventricular tachycardia at a rate of 150 also identified on that study representing either atrial flutter or PSVT; onset of persistent AF in 03/2011  . Carcinoma of breast ([place]) 2001   Left mastectomy with positive nodes  . Chest pain    Long-standing and atypical  . Chronic anticoagulation 04/12/2011  . GERD (gastroesophageal reflux disease)    hiatal hernia  . Herpes zoster   . Hyperlipidemia    Lipid profile in 11/2008:282, 176, 64, 201  . Hypertension    diastolic dysfunction; normal CMet and TSH in 11/2009; normal CBC in 04/2010  . Malignant neoplasm of breast (female), unspecified site 05/27/2013   right breast lumpectomy with positive node  . Peripheral neuropathy   . Polio 1946   at  age 7  . Rectal bleed   . Renal insufficiency   . Shortness of breath dyspnea   . Vitreous hemorrhage of right eye ([place]) 10/23/2019   Past Surgical History:  Procedure Laterality Date  . BREAST LUMPECTOMY WITH NEEDLE LOCALIZATION AND AXILLARY SENTINEL LYMPH NODE BX Right 05/27/2013   Procedure: BREAST LUMPECTOMY WITH NEEDLE LOCALIZATION AND AXILLARY SENTINEL LYMPH NODE BX;  Surgeon: Pedro Earls, MD;  Location: [facility];  Service: General;  Laterality: Right;  . BREAST SURGERY    . CHOLECYSTECTOMY  1999  . COLONOSCOPY  02/22/2011   Procedure: COLONOSCOPY;  Surgeon: Rogene Houston, MD;  [facility];  Service: Endoscopy;   Laterality: N/A;; performed for scant hematochezia  . DILATION AND CURETTAGE OF UTERUS    . ESOPHAGOGASTRODUODENOSCOPY  02/22/2011   Procedure: ESOPHAGOGASTRODUODENOSCOPY (EGD);  Surgeon: Rogene Houston, MD;  [facility];  Service: Endoscopy;  Laterality: N/A;  . ESOPHAGOGASTRODUODENOSCOPY N/A 06/02/2014   Procedure: ESOPHAGOGASTRODUODENOSCOPY (EGD);  Surgeon: Rogene Houston, MD;  [facility];  Service: Endoscopy;  Laterality: N/A;  730  . EYE SURGERY     both cataracts  . MALONEY DILATION N/A 06/02/2014   Procedure: Venia Minks DILATION;  Surgeon: Rogene Houston, MD;  [facility];  Service: Endoscopy;  Laterality: N/A;  . MASTECTOMY  2001   Left;modified radical with lymph node dissection  . PORT-A-CATH REMOVAL     pac in and out 2002    FAMILY HISTORY Family History  Problem Relation Age of Onset  . Cancer Sister        lung  . Cancer Brother        lipoma  . Cancer Brother        kidney    SOCIAL HISTORY Social History   Tobacco Use  . Smoking status: Never Smoker  . Smokeless tobacco: Never Used  Vaping Use  . Vaping Use: Never used  Substance Use Topics  . Alcohol use: No    Alcohol/week: 0.0 standard drinks  . Drug use: No         OPHTHALMIC EXAM: Base Eye Exam    Visual Acuity (Snellen - Linear)      Right Left   Dist [place] 20/80 + 20/60 +   Dist ph [place] NI 20/40 -2       Tonometry (Tonopen, 8:19 AM)      Right Left   Pressure 15 17       Pupils      Pupils Dark Light Shape React APD   Right PERRL 4 3 Round Slow None   Left PERRL 4 3 Round Slow None       Visual Fields (Counting fingers)      Left Right    Full Full       Neuro/Psych    Oriented x3: Yes   Mood/Affect: Normal       Dilation    Right eye: 1.0% Mydriacyl, 2.5% Phenylephrine @ 8:19 AM        Slit Lamp and Fundus Exam    External Exam      Right Left   External Normal Normal       Slit Lamp Exam      Right Left   Lids/Lashes Normal  Normal   Conjunctiva/Sclera White and quiet White and quiet   Cornea Clear Clear   Anterior Chamber Deep and quiet Deep and quiet   Iris Round and reactive Round and  reactive   Lens Posterior chamber intraocular lens Posterior chamber intraocular lens   Anterior Vitreous Normal Normal       Fundus Exam      Right Left   Posterior Vitreous Vitrectomized    Disc Normal    C/D Ratio 0.45    Macula Retinal pigment epithelial mottling, Retinal pigment epithelial detachment, Macular thickening, Retinal pigment epithelial atrophy signifying old RPE rip temporal to fovea subfoveal., Intraretinal hemorrhage, Subretinal hemorrhage, Geographic atrophy    Vessels Normal    Periphery Normal           IMAGING AND PROCEDURES  Imaging and Procedures for 06/22/20  OCT, Retina - OU - Both Eyes       Right Eye Quality was good. Scan locations included subfoveal. Central Foveal Thickness: 394. Progression has been stable. Findings include intraretinal fluid.   Left Eye Quality was good. Scan locations included subfoveal. Central Foveal Thickness: 251. Progression has been stable.   Notes OS at 5 days status post injection Avastin, stable with less intraretinal fluid  OD with stable configuration of RPE rip, subretinal fluid, intraretinal fluid does remain Yet less active overall.  Computer-generated wandering baseline through the fovea hydrates retinal thickening overall stable compared to last visit.  Repeat injection Avastin today at 7 weeks and examination right eye in 7 weeks       Intravitreal Injection, Pharmacologic Agent - OD - Right Eye       Time Out 06/22/2020. 9:16 AM. Confirmed correct patient, procedure, site, and patient consented.   Anesthesia Topical anesthesia was used. Anesthetic medications included [place] 3.5%.   Procedure Preparation included Tobramycin 0.3%, 10% betadine to eyelids, Ofloxacin . A 30 gauge needle was used.   Injection:  2.5 mg Bevacizumab  (AVASTIN) 2.5mg /0.93mL SOSY   NDC: 36644-034-74, Lot: [id number]   Route: Intravitreal, Site: Right Eye  Post-op Post injection exam found visual acuity of at least counting fingers. The patient tolerated the procedure well. There were no complications. The patient received written and verbal post procedure care education. Post injection medications were not given.                 ASSESSMENT/PLAN:  Exudative age-related macular degeneration of right eye with active choroidal neovascularization (HCC) Wet macular degeneration OD, complicated by RPE rip, scrolled RPE subfoveal with intraretinal hemorrhage remaining clinically, overall OCT remained stable.  Repeat intravitreal injection OD   Computer-generated wandering baseline through the fovea hydrates retinal thickening overall stable compared to last visit.  Repeat injection Avastin today at 7 weeks and examination right eye in 7 weeks  Macular pigment epithelial detachment, right Stable OD  Advanced nonexudative age-related macular degeneration of right eye with subfoveal involvement Stable, as component of wet ARMD      ICD-10-CM   1. Exudative age-related macular degeneration of right eye with active choroidal neovascularization (HCC)  H35.3211 OCT, Retina - OU - Both Eyes    Intravitreal Injection, Pharmacologic Agent - OD - Right Eye    bevacizumab (AVASTIN) SOSY 2.5 mg  2. Macular pigment epithelial detachment, right  H35.721   3. Advanced nonexudative age-related macular degeneration of right eye with subfoveal involvement  H35.3114     1.  OD stabilize yet clinically still active with intraretinal hemorrhage from CNVM/RPE rip.  Repeat intravitreal Avastin today OD at 7-week interval and examination again in 7 weeks  2.  OS improved 5 days post Avastin, follow-up as scheduled  3.  Ophthalmic Meds Ordered this visit:  Meds  ordered this encounter  Medications  . bevacizumab (AVASTIN) SOSY 2.5 mg       Return in  about 6 weeks (around 08/03/2020) for dilate, OD, AVASTIN OCT.  There are no Patient Instructions on file for this visit.   Explained the diagnoses, plan, and follow up with the patient and they expressed understanding.  Patient expressed understanding of the importance of proper follow up care.   Clent Demark Denys Labree M.D. Diseases & Surgery of the Retina and Vitreous [facility] [place] 06/22/20     Abbreviations: M myopia (nearsighted); A astigmatism; H hyperopia (farsighted); P presbyopia; Mrx spectacle prescription;  CTL contact lenses; OD right eye; OS left eye; OU both eyes  XT exotropia; ET esotropia; PEK punctate epithelial keratitis; PEE punctate epithelial erosions; DES dry eye syndrome; MGD meibomian gland dysfunction; ATs artificial tears; PFAT's preservative free artificial tears; [place] nuclear sclerotic cataract; PSC posterior subcapsular cataract; ERM epi-retinal membrane; PVD posterior vitreous detachment; RD retinal detachment; DM diabetes mellitus; DR diabetic retinopathy; NPDR non-proliferative diabetic retinopathy; PDR proliferative diabetic retinopathy; CSME clinically significant macular edema; DME diabetic macular edema; dbh dot blot hemorrhages; CWS cotton wool spot; POAG primary open angle glaucoma; C/D cup-to-disc ratio; HVF humphrey visual field; GVF goldmann visual field; OCT optical coherence tomography; IOP intraocular pressure; BRVO Branch retinal vein occlusion; CRVO central retinal vein occlusion; CRAO central retinal artery occlusion; BRAO branch retinal artery occlusion; RT retinal tear; SB scleral buckle; PPV pars plana vitrectomy; VH Vitreous hemorrhage; PRP panretinal laser photocoagulation; IVK intravitreal kenalog; VMT vitreomacular traction; MH Macular hole;  NVD neovascularization of the disc; NVE neovascularization elsewhere; AREDS age related eye disease study; ARMD age related macular degeneration; POAG primary open angle glaucoma; EBMD epithelial/anterior  basement membrane dystrophy; ACIOL anterior chamber intraocular lens; IOL intraocular lens; PCIOL posterior chamber intraocular lens; Phaco/IOL phacoemulsification with intraocular lens placement; [place] photorefractive keratectomy; LASIK laser assisted in situ keratomileusis; HTN hypertension; DM diabetes mellitus; COPD chronic obstructive pulmonary disease

## 2020-06-22 NOTE — Assessment & Plan Note (Addendum)
Wet macular degeneration OD, complicated by RPE rip, scrolled RPE subfoveal with intraretinal hemorrhage remaining clinically, overall OCT remained stable.  Repeat intravitreal injection OD   Computer-generated wandering baseline through the fovea hydrates retinal thickening overall stable compared to last visit.  Repeat injection Avastin today at 7 weeks and examination right eye in 7 weeks

## 2020-06-22 NOTE — Assessment & Plan Note (Signed)
Stable OD °

## 2020-07-08 ENCOUNTER — Ambulatory Visit (HOSPITAL_COMMUNITY): Payer: PPO

## 2020-07-12 ENCOUNTER — Ambulatory Visit (HOSPITAL_COMMUNITY): Payer: PPO

## 2020-07-13 ENCOUNTER — Encounter (INDEPENDENT_AMBULATORY_CARE_PROVIDER_SITE_OTHER): Payer: Self-pay | Admitting: Internal Medicine

## 2020-07-13 ENCOUNTER — Ambulatory Visit (INDEPENDENT_AMBULATORY_CARE_PROVIDER_SITE_OTHER): Payer: PPO | Admitting: Internal Medicine

## 2020-07-13 ENCOUNTER — Other Ambulatory Visit: Payer: Self-pay

## 2020-07-13 VITALS — BP 128/80 | HR 97 | Temp 97.5°F | Ht 61.5 in | Wt 169.8 lb

## 2020-07-13 DIAGNOSIS — K649 Unspecified hemorrhoids: Secondary | ICD-10-CM

## 2020-07-13 DIAGNOSIS — K219 Gastro-esophageal reflux disease without esophagitis: Secondary | ICD-10-CM | POA: Diagnosis not present

## 2020-07-13 DIAGNOSIS — K449 Diaphragmatic hernia without obstruction or gangrene: Secondary | ICD-10-CM | POA: Diagnosis not present

## 2020-07-13 MED ORDER — HYDROCORTISONE ACE-PRAMOXINE 1-1 % EX CREA: 1.0000 "application " | TOPICAL_CREAM | Freq: Two times a day (BID) | CUTANEOUS | 1 refills | Status: AC | PRN

## 2020-07-13 MED ORDER — PANTOPRAZOLE SODIUM 40 MG PO TBEC: 40.0000 mg | DELAYED_RELEASE_TABLET | Freq: Every day | ORAL | 3 refills | Status: DC

## 2020-07-13 NOTE — Patient Instructions (Signed)
Notify if sensation of fullness after meals gets worse

## 2020-07-13 NOTE — Progress Notes (Addendum)
Presenting complaint;  Follow for chronic GERD.  Database and subjective:  Patient is 87 year old Caucasian female who has chronic GERD and known history of large hiatal hernia who is here for scheduled visit.  She was last seen 1 year ago.  She feels she is doing well given her age.  She does complain of getting full quickly.  As result she is eating small meals.  She feels heartburn is well controlled with therapy.  She may have an occasional episode at bedtime when she uses Tums.  She has lost 8 pounds since her last visit.  She says she is happy she is losing weight.  She is not concerned.  She feels it is because she is eating better.  She denies hoarseness chronic cough sore throat or dysphagia.  She also denies melena or rectal bleeding.  Her bowels move daily.  Current Medications: Outpatient Encounter Medications as of 07/13/2020  Medication Sig  . acetaminophen (TYLENOL) 500 MG tablet Take 500 mg by mouth every 6 (six) hours as needed. Patient reports that she may take 1 or 2 times a day for hip pain.  Marland Kitchen albuterol (VENTOLIN HFA) 108 (90 Base) MCG/ACT inhaler Inhale 2 puffs into the lungs every 4 (four) hours as needed.  . carvedilol (COREG) 12.5 MG tablet Take 12.5 mg by mouth 2 (two) times daily with a meal.   . dorzolamide-timolol (COSOPT) 22.3-6.8 MG/ML ophthalmic solution Place 1 drop into the right eye 2 (two) times daily.   Marland Kitchen ELIQUIS 5 MG TABS tablet TAKE (1) TABLET BY MOUTH TWICE DAILY FOR BLOOD THINNER.  . furosemide (LASIX) 40 MG tablet TAKE ONE TABLET BY MOUTH ONCE DAILY.  Marland Kitchen Latanoprostene Bunod 0.024 % SOLN Place 1 drop into the right eye at bedtime.   Marland Kitchen losartan (COZAAR) 25 MG tablet Take 1 tablet (25 mg total) by mouth 2 (two) times daily.  . Multiple Vitamins-Minerals (MULTIVITAMIN ADULTS 50+ PO) Take by mouth daily.  . Multiple Vitamins-Minerals (VISION FORMULA EYE HEALTH PO) Take 1 tablet by mouth 2 (two) times daily.   . pantoprazole (PROTONIX) 40 MG tablet TAKE ONE  TABLET BY MOUTH DAILY BEFORE BREAKFAST FOR ACID REFLUX.  Marland Kitchen OVER THE COUNTER MEDICATION Take 1 capsule by mouth daily. Vision Health eye supplement (Patient not taking: Reported on 07/13/2020)   No facility-administered encounter medications on file as of 07/13/2020.     Objective: Blood pressure 128/80, pulse 97, temperature (!) 97.5 F (36.4 C), temperature source Oral, height 5' 1.5" (1.562 m), weight 169 lb 12.8 oz (77 kg). Patient is alert and in no acute distress. Conjunctiva is pink. Sclera is nonicteric Oropharyngeal mucosa is normal. No neck masses or thyromegaly noted. Cardiac exam with irregular rhythm normal S1 and S2. No murmur or gallop noted. Lungs are clear to auscultation. Abdomen is symmetrical.  On palpation is soft with mild midepigastric tenderness.  No organomegaly or masses. No LE edema or clubbing noted.  Labs/studies Results:  Barium study from 11 08/29/2013 reviewed.  Reveals large hiatal hernia but no other abnormalities.  Chest CT report from 04/11/2000 indicates presence of large hiatal hernia.   Assessment:  #1.  Chronic GERD.  Patient has large hiatal hernia which was documented 20 years ago.  He likely has done quite well without surgical intervention.  I doubt any complications given her clinical course over the last 20 years. Postprandial bloating is related to this hernia.  She should continue with 5 or 6 small meals.  She will stay on pantoprazole  as long as it is working.   Plan:  New prescription sent to patient's pharmacy for pantoprazole 40 mg p.o. every morning 90 doses with 3 refills. Patient advised to eat 5-6 small meals daily rather than 3. Patient will call office if bloating gets worse or she develops dysphagia or pantoprazole stops working. Office visit in 1 year.

## 2020-07-16 ENCOUNTER — Telehealth (INDEPENDENT_AMBULATORY_CARE_PROVIDER_SITE_OTHER): Payer: Self-pay | Admitting: *Deleted

## 2020-07-16 NOTE — Telephone Encounter (Signed)
The RX that was written for Hydrocort -Pramoxine 1% is not available through the [facility].  Per Dr.Rehman call the pharmacy and ask what the patient's insurance will cover. Per April some of the alterative are Anusol HC or Proctofoam. She was advised that either one will be fine for the patient.  She (April) will contact the patient.

## 2020-07-28 ENCOUNTER — Ambulatory Visit (HOSPITAL_COMMUNITY): Payer: PPO

## 2020-08-03 ENCOUNTER — Encounter (INDEPENDENT_AMBULATORY_CARE_PROVIDER_SITE_OTHER): Payer: PPO | Admitting: Ophthalmology

## 2020-08-04 ENCOUNTER — Other Ambulatory Visit: Payer: Self-pay

## 2020-08-04 ENCOUNTER — Ambulatory Visit (HOSPITAL_COMMUNITY)
Admission: RE | Admit: 2020-08-04 | Discharge: 2020-08-04 | Disposition: A | Discharge status: Home / Self Care | Payer: PPO | Source: Ambulatory Visit | Attending: Internal Medicine | Admitting: Internal Medicine

## 2020-08-04 DIAGNOSIS — M1990 Unspecified osteoarthritis, unspecified site: Secondary | ICD-10-CM | POA: Diagnosis not present

## 2020-08-04 DIAGNOSIS — F419 Anxiety disorder, unspecified: Secondary | ICD-10-CM | POA: Diagnosis not present

## 2020-08-04 DIAGNOSIS — Z1231 Encounter for screening mammogram for malignant neoplasm of breast: Secondary | ICD-10-CM | POA: Diagnosis not present

## 2020-08-04 DIAGNOSIS — I482 Chronic atrial fibrillation, unspecified: Secondary | ICD-10-CM | POA: Diagnosis not present

## 2020-08-04 DIAGNOSIS — K219 Gastro-esophageal reflux disease without esophagitis: Secondary | ICD-10-CM | POA: Diagnosis not present

## 2020-08-04 DIAGNOSIS — I5033 Acute on chronic diastolic (congestive) heart failure: Secondary | ICD-10-CM | POA: Diagnosis not present

## 2020-08-04 DIAGNOSIS — I10 Essential (primary) hypertension: Secondary | ICD-10-CM | POA: Diagnosis not present

## 2020-08-05 ENCOUNTER — Ambulatory Visit (INDEPENDENT_AMBULATORY_CARE_PROVIDER_SITE_OTHER): Payer: PPO | Admitting: Ophthalmology

## 2020-08-05 ENCOUNTER — Encounter (INDEPENDENT_AMBULATORY_CARE_PROVIDER_SITE_OTHER): Payer: Self-pay | Admitting: Ophthalmology

## 2020-08-05 DIAGNOSIS — H353221 Exudative age-related macular degeneration, left eye, with active choroidal neovascularization: Secondary | ICD-10-CM

## 2020-08-05 DIAGNOSIS — H353211 Exudative age-related macular degeneration, right eye, with active choroidal neovascularization: Secondary | ICD-10-CM

## 2020-08-05 DIAGNOSIS — H35721 Serous detachment of retinal pigment epithelium, right eye: Secondary | ICD-10-CM

## 2020-08-05 MED ORDER — BEVACIZUMAB 2.5 MG/0.1ML IZ SOSY
2.5000 mg | PREFILLED_SYRINGE | INTRAVITREAL | Status: AC | PRN
  Administered 2020-08-05: 2.5 mg via INTRAVITREAL

## 2020-08-05 NOTE — Progress Notes (Signed)
08/05/2020     CHIEF COMPLAINT Patient presents for Retina Follow Up (7 Week AMD F/U OS, poss Avastin OS//Pt denies noticeable changes to [place] OU since last visit. Pt denies ocular pain, flashes of light, or floaters OU. //)   HISTORY OF PRESENT ILLNESS: [name] is a 87 y.o. female who presents to the clinic today for:   HPI    Retina Follow Up    Patient presents with  Wet AMD.  In left eye.  This started 7 weeks ago.  Severity is mild.  Duration of 7 weeks.  Since onset it is stable. Additional comments: 7 Week AMD F/U OS, poss Avastin OS  Pt denies noticeable changes to [place] OU since last visit. Pt denies ocular pain, flashes of light, or floaters OU.          Last edited by Rockie Neighbours, [place] on 08/05/2020  8:18 AM. (History)      Referring physician: Asencion Noble, MD [address],  [place] [address]  HISTORICAL INFORMATION:   Selected notes from the MEDICAL RECORD NUMBER       CURRENT MEDICATIONS: Current Outpatient Medications (Ophthalmic Drugs)  Medication Sig  . dorzolamide-timolol (COSOPT) 22.3-6.8 MG/ML ophthalmic solution Place 1 drop into the right eye 2 (two) times daily.   . Latanoprostene Bunod 0.024 % SOLN Place 1 drop into the right eye at bedtime.    No current facility-administered medications for this visit. (Ophthalmic Drugs)   Current Outpatient Medications (Other)  Medication Sig  . acetaminophen (TYLENOL) 500 MG tablet Take 500 mg by mouth every 6 (six) hours as needed. Patient reports that she may take 1 or 2 times a day for hip pain.  Marland Kitchen albuterol (VENTOLIN HFA) 108 (90 Base) MCG/ACT inhaler Inhale 2 puffs into the lungs every 4 (four) hours as needed.  . carvedilol (COREG) 12.5 MG tablet Take 12.5 mg by mouth 2 (two) times daily with a meal.   . ELIQUIS 5 MG TABS tablet TAKE (1) TABLET BY MOUTH TWICE DAILY FOR BLOOD THINNER.  . furosemide (LASIX) 40 MG tablet TAKE ONE TABLET BY MOUTH ONCE DAILY. (Patient taking differently:  Take 40 mg by mouth every other day as needed.)  . losartan (COZAAR) 25 MG tablet Take 1 tablet (25 mg total) by mouth 2 (two) times daily.  . Multiple Vitamins-Minerals (MULTIVITAMIN ADULTS 50+ PO) Take by mouth daily.  . Multiple Vitamins-Minerals (VISION FORMULA EYE HEALTH PO) Take 1 tablet by mouth 2 (two) times daily.   . pantoprazole (PROTONIX) 40 MG tablet Take 1 tablet (40 mg total) by mouth daily before breakfast.  . pramoxine-hydrocortisone (PROCTOCREAM-HC) 1-1 % rectal cream Place 1 application rectally 2 (two) times daily as needed for hemorrhoids or anal itching.   No current facility-administered medications for this visit. (Other)      REVIEW OF SYSTEMS:    ALLERGIES Allergies  Allergen Reactions  . Atorvastatin Other (See Comments)    Myalgias  . Hydrocodone Other (See Comments)    GI distress.    PAST MEDICAL HISTORY Past Medical History:  Diagnosis Date  . Anxiety   . Arteriosclerotic cardiovascular disease (ASCVD)     cath Feb '07- no obstructive dz - 20% proximal LAD only; EF 65%; possible coronary artery spasm  . Atrial fibrillation (HCC)    Paroxysmal on event recorder in 2009; regular supraventricular tachycardia at a rate of 150 also identified on that study representing either atrial flutter or PSVT; onset of persistent AF  in 03/2011  . Carcinoma of breast ([place]) 2001   Left mastectomy with positive nodes  . Chest pain    Long-standing and atypical  . Chronic anticoagulation 04/12/2011  . GERD (gastroesophageal reflux disease)    hiatal hernia  . Herpes zoster   . Hyperlipidemia    Lipid profile in 11/2008:282, 176, 64, 201  . Hypertension    diastolic dysfunction; normal CMet and TSH in 11/2009; normal CBC in 04/2010  . Malignant neoplasm of breast (female), unspecified site 05/27/2013   right breast lumpectomy with positive node  . Peripheral neuropathy   . Polio 1946   at age 41  . Rectal bleed   . Renal insufficiency   . Shortness of breath  dyspnea   . Vitreous hemorrhage of right eye ([place]) 10/23/2019   Past Surgical History:  Procedure Laterality Date  . BREAST LUMPECTOMY WITH NEEDLE LOCALIZATION AND AXILLARY SENTINEL LYMPH NODE BX Right 05/27/2013   Procedure: BREAST LUMPECTOMY WITH NEEDLE LOCALIZATION AND AXILLARY SENTINEL LYMPH NODE BX;  Surgeon: Pedro Earls, MD;  Location: [facility];  Service: General;  Laterality: Right;  . BREAST SURGERY    . CHOLECYSTECTOMY  1999  . COLONOSCOPY  02/22/2011   Procedure: COLONOSCOPY;  Surgeon: Rogene [place], MD;  [facility];  Service: Endoscopy;  Laterality: N/A;; performed for scant hematochezia  . DILATION AND CURETTAGE OF UTERUS    . ESOPHAGOGASTRODUODENOSCOPY  02/22/2011   Procedure: ESOPHAGOGASTRODUODENOSCOPY (EGD);  Surgeon: Rogene [place], MD;  [facility];  Service: Endoscopy;  Laterality: N/A;  . ESOPHAGOGASTRODUODENOSCOPY N/A 06/02/2014   Procedure: ESOPHAGOGASTRODUODENOSCOPY (EGD);  Surgeon: Rogene [place], MD;  [facility];  Service: Endoscopy;  Laterality: N/A;  730  . EYE SURGERY     both cataracts  . MALONEY DILATION N/A 06/02/2014   Procedure: Venia Minks DILATION;  Surgeon: Rogene [place], MD;  [facility];  Service: Endoscopy;  Laterality: N/A;  . MASTECTOMY  2001   Left;modified radical with lymph node dissection  . PORT-A-CATH REMOVAL     pac in and out 2002    FAMILY HISTORY Family History  Problem Relation Age of Onset  . Cancer Sister        lung  . Cancer Brother        lipoma  . Cancer Brother        kidney    SOCIAL HISTORY Social History   Tobacco Use  . Smoking status: Never Smoker  . Smokeless tobacco: Never Used  Vaping Use  . Vaping Use: Never used  Substance Use Topics  . Alcohol use: No    Alcohol/week: 0.0 standard drinks  . Drug use: No         OPHTHALMIC EXAM:  Base Eye Exam    Visual Acuity (ETDRS)      Right Left   Dist [place] 20/70 -1 20/70 +1   Dist ph  [place] NI 20/30       Tonometry (Tonopen, 8:18 AM)      Right Left   Pressure 18 15       Pupils      Pupils Dark Light Shape React APD   Right PERRL 5 4 Round Slow None   Left PERRL 5 4 Round Slow None       Visual Fields (Counting fingers)      Left Right    Full Full       Extraocular Movement  Right Left    Full Full       Neuro/Psych    Oriented x3: Yes   Mood/Affect: Normal       Dilation    Left eye: 1.0% Mydriacyl, 2.5% Phenylephrine @ 8:23 AM        Slit Lamp and Fundus Exam    External Exam      Right Left   External Normal Normal       Slit Lamp Exam      Right Left   Lids/Lashes Normal Normal   Conjunctiva/Sclera White and quiet White and quiet   Cornea Clear Clear   Anterior Chamber Deep and quiet Deep and quiet   Iris Round and reactive Round and reactive   Lens Posterior chamber intraocular lens Posterior chamber intraocular lens   Anterior Vitreous Normal Normal       Fundus Exam      Right Left   Posterior Vitreous clear , vitrectomized Posterior vitreous detachment   Disc  Normal   C/D Ratio  0.3   Macula  Disciform scar, Drusen, no exudates, no hemorrhage, no macular thickening, Mottling, Retinal pigment epithelial mottling, Atrophy, Retinal pigment epithelial atrophy, Age related macular degeneration, Early age related macular degeneration, Geographic atrophy, Pigmented atrophy, Hard drusen,   Vessels  Normal   Periphery  Normal, no holes          IMAGING AND PROCEDURES  Imaging and Procedures for 08/05/20  OCT, Retina - OU - Both Eyes       Right Eye Quality was good. Scan locations included subfoveal. Central Foveal Thickness: 355. Progression has been stable. Findings include choroidal neovascular membrane, pigment epithelial detachment, subretinal fluid.   Left Eye Quality was good. Scan locations included subfoveal. Central Foveal Thickness: 246. Progression has been stable. Findings include abnormal foveal contour,  intraretinal fluid, subretinal fluid, subretinal scarring, choroidal neovascular membrane.   Notes OD with increased subretinal fluid on nasal aspect of the large subfoveal vascularized pigment epithelial detachment.  This at 6-week follow-up.  We will repeat examination as scheduled soon and consider injection OD  OS with CNVM active and intraretinal fluid temporal to the fovea which is still persistent at 7-week interval today.  Will repeat injection Avastin today and we will follow up 5 weeks to maintain anatomy and prevent progression of CNVM       Intravitreal Injection, Pharmacologic Agent - OS - Left Eye       Time Out 08/05/2020. 9:16 AM. Confirmed correct patient, procedure, site, and patient consented.   Anesthesia Topical anesthesia was used. Anesthetic medications included [place] 3.5%.   Procedure Preparation included Ofloxacin , Tobramycin 0.3%, 10% betadine to eyelids, 5% betadine to ocular surface. A supplied needle was used.   Injection:  2.5 mg Bevacizumab (AVASTIN) 2.5mg /0.45mL SOSY   NDC: 64403-474-25, Lot: [id number]   Route: Intravitreal, Site: Left Eye  Post-op Post injection exam found visual acuity of at least counting fingers. The patient tolerated the procedure well. There were no complications. The patient received written and verbal post procedure care education. Post injection medications were not given.                 ASSESSMENT/PLAN:  Exudative age-related macular degeneration of right eye with active choroidal neovascularization (HCC) Follow-up OD as scheduled soon  Macular pigment epithelial detachment, right OCT finding of increased subretinal fluid      ICD-10-CM   1. Exudative age-related macular degeneration of left eye with active choroidal neovascularization ([place])  H35.3221 OCT, Retina - OU - Both Eyes    Intravitreal Injection, Pharmacologic Agent - OS - Left Eye    bevacizumab (AVASTIN) SOSY 2.5 mg  2. Exudative age-related  macular degeneration of right eye with active choroidal neovascularization ([place])  H35.3211   3. Macular pigment epithelial detachment, right  H35.721     1.  Repeat injection intravitreal Avastin OS today to prevent intraretinal fluid, from CNVM temporal to the fovea extending into the macula and [place] best visual acuity  2.  OD with vascularized subfoveal pigment epithelial detachment with increased subretinal fluid nasally will need treatment and further evaluation next week  3.  Ophthalmic Meds Ordered this visit:  Meds ordered this encounter  Medications  . bevacizumab (AVASTIN) SOSY 2.5 mg       Return in about 1 week (around 08/12/2020) for dilate, OD, AVASTIN OCT,, and schedule OS Avastin OCT 5 weeks.  Patient Instructions  Instructed to contact the office promptly new onset visual acuity declines or distortions    Explained the diagnoses, plan, and follow up with the patient and they expressed understanding.  Patient expressed understanding of the importance of proper follow up care.   Clent Demark Zayda Angell M.D. Diseases & Surgery of the Retina and Vitreous [facility] [place] 08/05/20     Abbreviations: M myopia (nearsighted); A astigmatism; H hyperopia (farsighted); P presbyopia; Mrx spectacle prescription;  CTL contact lenses; OD right eye; OS left eye; OU both eyes  XT exotropia; ET esotropia; PEK punctate epithelial keratitis; PEE punctate epithelial erosions; DES dry eye syndrome; MGD meibomian gland dysfunction; ATs artificial tears; PFAT's preservative free artificial tears; [place] nuclear sclerotic cataract; PSC posterior subcapsular cataract; ERM epi-retinal membrane; PVD posterior vitreous detachment; RD retinal detachment; DM diabetes mellitus; DR diabetic retinopathy; NPDR non-proliferative diabetic retinopathy; PDR proliferative diabetic retinopathy; CSME clinically significant macular edema; DME diabetic macular edema; dbh dot blot hemorrhages; CWS cotton  wool spot; POAG primary open angle glaucoma; C/D cup-to-disc ratio; HVF humphrey visual field; GVF goldmann visual field; OCT optical coherence tomography; IOP intraocular pressure; BRVO Branch retinal vein occlusion; CRVO central retinal vein occlusion; CRAO central retinal artery occlusion; BRAO branch retinal artery occlusion; RT retinal tear; SB scleral buckle; PPV pars plana vitrectomy; VH Vitreous hemorrhage; PRP panretinal laser photocoagulation; IVK intravitreal kenalog; VMT vitreomacular traction; MH Macular hole;  NVD neovascularization of the disc; NVE neovascularization elsewhere; AREDS age related eye disease study; ARMD age related macular degeneration; POAG primary open angle glaucoma; EBMD epithelial/anterior basement membrane dystrophy; ACIOL anterior chamber intraocular lens; IOL intraocular lens; PCIOL posterior chamber intraocular lens; Phaco/IOL phacoemulsification with intraocular lens placement; [place] photorefractive keratectomy; LASIK laser assisted in situ keratomileusis; HTN hypertension; DM diabetes mellitus; COPD chronic obstructive pulmonary disease

## 2020-08-05 NOTE — Assessment & Plan Note (Signed)
Follow-up OD as scheduled soon

## 2020-08-05 NOTE — Assessment & Plan Note (Signed)
OCT finding of increased subretinal fluid

## 2020-08-05 NOTE — Patient Instructions (Signed)
Instructed to contact the office promptly new onset visual acuity declines or distortions

## 2020-08-09 DIAGNOSIS — I5030 Unspecified diastolic (congestive) heart failure: Secondary | ICD-10-CM | POA: Diagnosis not present

## 2020-08-09 DIAGNOSIS — I482 Chronic atrial fibrillation, unspecified: Secondary | ICD-10-CM | POA: Diagnosis not present

## 2020-08-10 ENCOUNTER — Other Ambulatory Visit: Payer: Self-pay

## 2020-08-10 ENCOUNTER — Ambulatory Visit: Payer: PPO | Admitting: Internal Medicine

## 2020-08-10 ENCOUNTER — Encounter: Payer: Self-pay | Admitting: Internal Medicine

## 2020-08-10 VITALS — BP 120/78 | HR 88 | Ht 60.0 in | Wt 169.0 lb

## 2020-08-10 DIAGNOSIS — I4891 Unspecified atrial fibrillation: Secondary | ICD-10-CM | POA: Diagnosis not present

## 2020-08-10 DIAGNOSIS — I10 Essential (primary) hypertension: Secondary | ICD-10-CM

## 2020-08-10 DIAGNOSIS — I5032 Chronic diastolic (congestive) heart failure: Secondary | ICD-10-CM | POA: Diagnosis not present

## 2020-08-10 NOTE — Addendum Note (Signed)
Addended by: Levonne Hubert on: 08/10/2020 04:41 PM   Modules accepted: Orders

## 2020-08-10 NOTE — Progress Notes (Signed)
HPI [name] returns today for followup. She is a pleasant 87 yo woman with a h/o  HTN, chronic diastolic heart failure, and chronic atrial fib. She has class 2 symptoms. She has mild LV dysfunction. She takes her lasix 3 times a week. She has not had syncope. No chest pain. She has mild class 2 peripheral edema.  Allergies  Allergen Reactions  . Atorvastatin Other (See Comments)    Myalgias  . Hydrocodone Other (See Comments)    GI distress.     Current Outpatient Medications  Medication Sig Dispense Refill  . acetaminophen (TYLENOL) 500 MG tablet Take 500 mg by mouth every 6 (six) hours as needed. Patient reports that she may take 1 or 2 times a day for hip pain.    Marland Kitchen albuterol (VENTOLIN HFA) 108 (90 Base) MCG/ACT inhaler Inhale 2 puffs into the lungs every 4 (four) hours as needed.    . carvedilol (COREG) 12.5 MG tablet Take 12.5 mg by mouth 2 (two) times daily with a meal.     . dorzolamide-timolol (COSOPT) 22.3-6.8 MG/ML ophthalmic solution Place 1 drop into the right eye 2 (two) times daily.     Marland Kitchen ELIQUIS 5 MG TABS tablet TAKE (1) TABLET BY MOUTH TWICE DAILY FOR BLOOD THINNER. 60 tablet 5  . furosemide (LASIX) 40 MG tablet TAKE ONE TABLET BY MOUTH ONCE DAILY. (Patient taking differently: Take 40 mg by mouth every other day as needed.) 90 tablet 0  . Latanoprostene Bunod 0.024 % SOLN Place 1 drop into the right eye at bedtime.     Marland Kitchen losartan (COZAAR) 25 MG tablet Take 1 tablet (25 mg total) by mouth 2 (two) times daily. 90 tablet 3  . Multiple Vitamins-Minerals (VISION FORMULA EYE HEALTH PO) Take 1 tablet by mouth 2 (two) times daily.     . pantoprazole (PROTONIX) 40 MG tablet Take 1 tablet (40 mg total) by mouth daily before breakfast. 90 tablet 3  . pramoxine-hydrocortisone (PROCTOCREAM-HC) 1-1 % rectal cream Place 1 application rectally 2 (two) times daily as needed for hemorrhoids or anal itching. 30 g 1   No current facility-administered medications for this visit.      Past Medical History:  Diagnosis Date  . Anxiety   . Arteriosclerotic cardiovascular disease (ASCVD)     cath Feb '07- no obstructive dz - 20% proximal LAD only; EF 65%; possible coronary artery spasm  . Atrial fibrillation (HCC)    Paroxysmal on event recorder in 2009; regular supraventricular tachycardia at a rate of 150 also identified on that study representing either atrial flutter or PSVT; onset of persistent AF in 03/2011  . Carcinoma of breast ([place]) 2001   Left mastectomy with positive nodes  . Chest pain    Long-standing and atypical  . Chronic anticoagulation 04/12/2011  . GERD (gastroesophageal reflux disease)    hiatal hernia  . Herpes zoster   . Hyperlipidemia    Lipid profile in 11/2008:282, 176, 64, 201  . Hypertension    diastolic dysfunction; normal CMet and TSH in 11/2009; normal CBC in 04/2010  . Malignant neoplasm of breast (female), unspecified site 05/27/2013   right breast lumpectomy with positive node  . Peripheral neuropathy   . Polio 1946   at age 66  . Rectal bleed   . Renal insufficiency   . Shortness of breath dyspnea   . Vitreous hemorrhage of right eye ([place]) 10/23/2019    ROS:   All systems reviewed and negative except  as noted in the HPI.   Past Surgical History:  Procedure Laterality Date  . BREAST LUMPECTOMY WITH NEEDLE LOCALIZATION AND AXILLARY SENTINEL LYMPH NODE BX Right 05/27/2013   Procedure: BREAST LUMPECTOMY WITH NEEDLE LOCALIZATION AND AXILLARY SENTINEL LYMPH NODE BX;  Surgeon: Pedro Earls, MD;  Location: [facility];  Service: General;  Laterality: Right;  . BREAST SURGERY    . CHOLECYSTECTOMY  1999  . COLONOSCOPY  02/22/2011   Procedure: COLONOSCOPY;  Surgeon: Rogene [place], MD;  [facility];  Service: Endoscopy;  Laterality: N/A;; performed for scant hematochezia  . DILATION AND CURETTAGE OF UTERUS    . ESOPHAGOGASTRODUODENOSCOPY  02/22/2011   Procedure: ESOPHAGOGASTRODUODENOSCOPY (EGD);   Surgeon: Rogene [place], MD;  [facility];  Service: Endoscopy;  Laterality: N/A;  . ESOPHAGOGASTRODUODENOSCOPY N/A 06/02/2014   Procedure: ESOPHAGOGASTRODUODENOSCOPY (EGD);  Surgeon: Rogene [place], MD;  [facility];  Service: Endoscopy;  Laterality: N/A;  730  . EYE SURGERY     both cataracts  . MALONEY DILATION N/A 06/02/2014   Procedure: Venia Minks DILATION;  Surgeon: Rogene [place], MD;  [facility];  Service: Endoscopy;  Laterality: N/A;  . MASTECTOMY  2001   Left;modified radical with lymph node dissection  . PORT-A-CATH REMOVAL     pac in and out 2002     Family History  Problem Relation Age of Onset  . Cancer Sister        lung  . Cancer Brother        lipoma  . Cancer Brother        kidney     Social History   Socioeconomic History  . Marital status: Widowed    Spouse name: Not on file  . Number of children: 2  . Years of education: Not on file  . Highest education level: Not on file  Occupational History  . Occupation: homemaker  Tobacco Use  . Smoking status: Never Smoker  . Smokeless tobacco: Never Used  Vaping Use  . Vaping Use: Never used  Substance and Sexual Activity  . Alcohol use: No    Alcohol/week: 0.0 standard drinks  . Drug use: No  . Sexual activity: Not Currently    Birth control/protection: Post-menopausal  Other Topics Concern  . Not on file  Social History Narrative  . Not on file   Social Determinants of Health   Financial Resource Strain: Not on file  Food Insecurity: Not on file  Transportation Needs: Not on file  Physical Activity: Not on file  Stress: Not on file  Social Connections: Not on file  Intimate Partner Violence: Not on file     BP 120/78   Pulse 88   Ht 5' (1.524 m)   Wt 169 lb (76.7 kg)   SpO2 95%   BMI 33.01 kg/m   Physical Exam:  Overweight elderly appearing NAD HEENT: Unremarkable Neck:  No JVD, no thyromegally Lymphatics:  No adenopathy Back:  No CVA  tenderness Lungs:  Clear with no wheezes HEART:  Regular rate rhythm, no murmurs, no rubs, no clicks Abd:  soft, positive bowel sounds, no organomegally, no rebound, no guarding Ext:  2 plus pulses, no edema, no cyanosis, no clubbing Skin:  No rashes no nodules Neuro:  CN II through XII intact, motor grossly intact  EKG - atrial fib with a controlled VR   Assess/Plan: 1. Chronic diastolic heart failure - her symptoms are class 2. I encouraged her to  avoid salty food and increase her physical activity. I considered adding Entresto but will hold off for now. I asked her to consider taking a half tablet of lasix on days she takes none.   2. Perm atrial fib - her VR is well controlled. She denies palpitations.  3. HTN - her bp is well controlled. No change in meds.  4. Obesity - she has a bmi of 33. She is encouraged to lose weight.  Carleene Overlie Verdun Rackley,MD

## 2020-08-10 NOTE — Patient Instructions (Signed)
Medication Instructions:  Your physician recommends that you continue on your current medications as directed. Please refer to the Current Medication list given to you today.  *If you need a refill on your cardiac medications before your next appointment, please call your pharmacy*   Lab Work: NONE   If you have labs (blood work) drawn today and your tests are completely normal, you will receive your results only by: Marland Kitchen MyChart Message (if you have MyChart) OR . A paper copy in the mail If you have any lab test that is abnormal or we need to change your treatment, we will call you to review the results.   Testing/Procedures: NONE    Follow-Up: At [facility], you and your health needs are our priority.  As part of our continuing mission to provide you with exceptional heart care, we have created designated Provider Care Teams.  These Care Teams include your primary Cardiologist (physician) and Advanced Practice Providers (APPs -  Physician Assistants and Nurse Practitioners) who all work together to provide you with the care you need, when you need it.  We recommend signing up for the patient portal called "MyChart".  Sign up information is provided on this After Visit Summary.  MyChart is used to connect with patients for Virtual Visits (Telemedicine).  Patients are able to view lab/test results, encounter notes, upcoming appointments, etc.  Non-urgent messages can be sent to your provider as well.   To learn more about what you can do with MyChart, go to [email or web address].    Your next appointment:   1 year(s)  The format for your next appointment:   In Person  Provider:   Cristopher Peru, MD   Other Instructions You have been given samples of Eliquis today

## 2020-08-12 ENCOUNTER — Encounter (INDEPENDENT_AMBULATORY_CARE_PROVIDER_SITE_OTHER): Payer: PPO | Admitting: Ophthalmology

## 2020-08-12 ENCOUNTER — Other Ambulatory Visit: Payer: Self-pay

## 2020-08-12 ENCOUNTER — Ambulatory Visit (INDEPENDENT_AMBULATORY_CARE_PROVIDER_SITE_OTHER): Payer: PPO | Admitting: Ophthalmology

## 2020-08-12 ENCOUNTER — Encounter (INDEPENDENT_AMBULATORY_CARE_PROVIDER_SITE_OTHER): Payer: Self-pay | Admitting: Ophthalmology

## 2020-08-12 DIAGNOSIS — H35721 Serous detachment of retinal pigment epithelium, right eye: Secondary | ICD-10-CM

## 2020-08-12 DIAGNOSIS — H353114 Nonexudative age-related macular degeneration, right eye, advanced atrophic with subfoveal involvement: Secondary | ICD-10-CM

## 2020-08-12 DIAGNOSIS — H353211 Exudative age-related macular degeneration, right eye, with active choroidal neovascularization: Secondary | ICD-10-CM | POA: Diagnosis not present

## 2020-08-12 DIAGNOSIS — H353221 Exudative age-related macular degeneration, left eye, with active choroidal neovascularization: Secondary | ICD-10-CM

## 2020-08-12 MED ORDER — BEVACIZUMAB 2.5 MG/0.1ML IZ SOSY
2.5000 mg | PREFILLED_SYRINGE | INTRAVITREAL | Status: AC | PRN
  Administered 2020-08-12: 2.5 mg via INTRAVITREAL

## 2020-08-12 NOTE — Assessment & Plan Note (Signed)
As part of subfoveal CNVM, and RPE rip stable

## 2020-08-12 NOTE — Assessment & Plan Note (Signed)
Chronic subfoveal disciform scar with subretinal fluid.  Much less intraretinal fluid and some permanent scarring temporally.  At 7-week interval.  We will repeat injection Avastin today and examination again in 7 weeks

## 2020-08-12 NOTE — Assessment & Plan Note (Signed)
1 week post injection left eye, follow-up as scheduled

## 2020-08-12 NOTE — Assessment & Plan Note (Signed)
Accounts for visual acuity

## 2020-08-12 NOTE — Progress Notes (Signed)
08/12/2020     CHIEF COMPLAINT Patient presents for Retina Follow Up (7 Week Wet AMD f\u OD. Possible Avastin OD. OCT/Pt states no changes since last visit. Denies new complaints.)   HISTORY OF PRESENT ILLNESS: [name] is a 87 y.o. female who presents to the clinic today for:   HPI    Retina Follow Up    Patient presents with  Wet AMD.  In right eye.  Severity is moderate.  Duration of 7 weeks.  Since onset it is stable.  I, the attending physician,  performed the HPI with the patient and updated documentation appropriately. Additional comments: 7 Week Wet AMD f\u OD. Possible Avastin OD. OCT Pt states no changes since last visit. Denies new complaints.       Last edited by Tilda Franco on 08/12/2020  2:38 PM. (History)      Referring physician: Asencion Noble, MD [address] [place],  [place] [address]  HISTORICAL INFORMATION:   Selected notes from the MEDICAL RECORD NUMBER       CURRENT MEDICATIONS: Current Outpatient Medications (Ophthalmic Drugs)  Medication Sig  . dorzolamide-timolol (COSOPT) 22.3-6.8 MG/ML ophthalmic solution Place 1 drop into the right eye 2 (two) times daily.   . Latanoprostene Bunod 0.024 % SOLN Place 1 drop into the right eye at bedtime.    No current facility-administered medications for this visit. (Ophthalmic Drugs)   Current Outpatient Medications (Other)  Medication Sig  . acetaminophen (TYLENOL) 500 MG tablet Take 500 mg by mouth every 6 (six) hours as needed. Patient reports that she may take 1 or 2 times a day for hip pain.  Marland Kitchen albuterol (VENTOLIN HFA) 108 (90 Base) MCG/ACT inhaler Inhale 2 puffs into the lungs every 4 (four) hours as needed.  . carvedilol (COREG) 12.5 MG tablet Take 12.5 mg by mouth 2 (two) times daily with a meal.   . ELIQUIS 5 MG TABS tablet TAKE (1) TABLET BY MOUTH TWICE DAILY FOR BLOOD THINNER.  . furosemide (LASIX) 40 MG tablet TAKE ONE TABLET BY MOUTH ONCE DAILY. (Patient taking differently: Take 40  mg by mouth every other day as needed.)  . losartan (COZAAR) 25 MG tablet Take 1 tablet (25 mg total) by mouth 2 (two) times daily.  . Multiple Vitamins-Minerals (VISION FORMULA EYE HEALTH PO) Take 1 tablet by mouth 2 (two) times daily.   . pantoprazole (PROTONIX) 40 MG tablet Take 1 tablet (40 mg total) by mouth daily before breakfast.  . pramoxine-hydrocortisone (PROCTOCREAM-HC) 1-1 % rectal cream Place 1 application rectally 2 (two) times daily as needed for hemorrhoids or anal itching.   No current facility-administered medications for this visit. (Other)      REVIEW OF SYSTEMS:    ALLERGIES Allergies  Allergen Reactions  . Atorvastatin Other (See Comments)    Myalgias  . Hydrocodone Other (See Comments)    GI distress.    PAST MEDICAL HISTORY Past Medical History:  Diagnosis Date  . Anxiety   . Arteriosclerotic cardiovascular disease (ASCVD)     cath Feb '07- no obstructive dz - 20% proximal LAD only; EF 65%; possible coronary artery spasm  . Atrial fibrillation (HCC)    Paroxysmal on event recorder in 2009; regular supraventricular tachycardia at a rate of 150 also identified on that study representing either atrial flutter or PSVT; onset of persistent AF in 03/2011  . Carcinoma of breast ([facility]) 2001   Left mastectomy with positive nodes  . Chest pain  Long-standing and atypical  . Chronic anticoagulation 04/12/2011  . GERD (gastroesophageal reflux disease)    hiatal hernia  . Herpes zoster   . Hyperlipidemia    Lipid profile in 11/2008:282, 176, 64, 201  . Hypertension    diastolic dysfunction; normal CMet and TSH in 11/2009; normal CBC in 04/2010  . Malignant neoplasm of breast (female), unspecified site 05/27/2013   right breast lumpectomy with positive node  . Peripheral neuropathy   . Polio 1946   at age 49  . Rectal bleed   . Renal insufficiency   . Shortness of breath dyspnea   . Vitreous hemorrhage of right eye ([facility]) 10/23/2019   Past Surgical History:   Procedure Laterality Date  . BREAST LUMPECTOMY WITH NEEDLE LOCALIZATION AND AXILLARY SENTINEL LYMPH NODE BX Right 05/27/2013   Procedure: BREAST LUMPECTOMY WITH NEEDLE LOCALIZATION AND AXILLARY SENTINEL LYMPH NODE BX;  Surgeon: Pedro Earls, MD;  Location: [facility];  Service: General;  Laterality: Right;  . BREAST SURGERY    . CHOLECYSTECTOMY  1999  . COLONOSCOPY  02/22/2011   Procedure: COLONOSCOPY;  Surgeon: Rogene Houston, MD;  [facility];  Service: Endoscopy;  Laterality: N/A;; performed for scant hematochezia  . DILATION AND CURETTAGE OF UTERUS    . ESOPHAGOGASTRODUODENOSCOPY  02/22/2011   Procedure: ESOPHAGOGASTRODUODENOSCOPY (EGD);  Surgeon: Rogene Houston, MD;  [facility];  Service: Endoscopy;  Laterality: N/A;  . ESOPHAGOGASTRODUODENOSCOPY N/A 06/02/2014   Procedure: ESOPHAGOGASTRODUODENOSCOPY (EGD);  Surgeon: Rogene Houston, MD;  [facility];  Service: Endoscopy;  Laterality: N/A;  730  . EYE SURGERY     both cataracts  . MALONEY DILATION N/A 06/02/2014   Procedure: Venia Minks DILATION;  Surgeon: Rogene Houston, MD;  [facility];  Service: Endoscopy;  Laterality: N/A;  . MASTECTOMY  2001   Left;modified radical with lymph node dissection  . PORT-A-CATH REMOVAL     pac in and out 2002    FAMILY HISTORY Family History  Problem Relation Age of Onset  . Cancer Sister        lung  . Cancer Brother        lipoma  . Cancer Brother        kidney    SOCIAL HISTORY Social History   Tobacco Use  . Smoking status: Never Smoker  . Smokeless tobacco: Never Used  Vaping Use  . Vaping Use: Never used  Substance Use Topics  . Alcohol use: No    Alcohol/week: 0.0 standard drinks  . Drug use: No         OPHTHALMIC EXAM: Base Eye Exam    Visual Acuity (Snellen - Linear)      Right Left   Dist [place] 20/400 20/50 -2   Dist ph [place] NI NI       Tonometry (Tonopen, 2:44 PM)      Right Left   Pressure 19 18        Pupils      Pupils Dark Light Shape React APD   Right PERRL 5 4 Round Brisk None   Left PERRL 5 4 Round Brisk None       Visual Fields (Counting fingers)      Left Right    Full Full       Neuro/Psych    Oriented x3: Yes   Mood/Affect: Normal       Dilation    Right eye: 1.0% Mydriacyl, 2.5% Phenylephrine @ 2:44  PM        Slit Lamp and Fundus Exam    External Exam      Right Left   External Normal Normal       Slit Lamp Exam      Right Left   Lids/Lashes Normal Normal   Conjunctiva/Sclera White and quiet White and quiet   Cornea Clear Clear   Anterior Chamber Deep and quiet Deep and quiet   Iris Round and reactive Round and reactive   Lens Posterior chamber intraocular lens Posterior chamber intraocular lens   Anterior Vitreous Normal Normal       Fundus Exam      Right Left   Posterior Vitreous Vitrectomized    Disc Normal    C/D Ratio 0.45    Macula Retinal pigment epithelial mottling, Retinal pigment epithelial detachment, Macular thickening, Retinal pigment epithelial atrophy signifying old RPE rip temporal to fovea subfoveal., Intraretinal hemorrhage, Subretinal hemorrhage, Geographic atrophy    Vessels Normal    Periphery Normal           IMAGING AND PROCEDURES  Imaging and Procedures for 08/12/20  OCT, Retina - OU - Both Eyes       Right Eye Quality was good. Scan locations included subfoveal. Central Foveal Thickness: 478. Progression has been stable. Findings include subretinal hyper-reflective material, disciform scar.   Left Eye Quality was good. Scan locations included subfoveal. Central Foveal Thickness: 238. Progression has improved. Findings include abnormal foveal contour, intraretinal fluid.   Notes Disciform macular scar subfoveal OD, yet with less activity currently at 7-week follow-up interval OD.  Much less subretinal fluid and intraretinal fluid, follow-up at 7-week.  Yet still active   OS, 1 week post injection still  improving       Intravitreal Injection, Pharmacologic Agent - OD - Right Eye       Time Out 08/12/2020. 3:11 PM. Confirmed correct patient, procedure, site, and patient consented.   Anesthesia Topical anesthesia was used. Anesthetic medications included Akten 3.5%.   Procedure Preparation included Tobramycin 0.3%, 10% betadine to eyelids, Ofloxacin . A 30 gauge needle was used.   Injection:  2.5 mg Bevacizumab (AVASTIN) 2.5mg /0.33mL SOSY   NDC: 42706-237-62, Lot: [id number]   Route: Intravitreal, Site: Right Eye  Post-op Post injection exam found visual acuity of at least counting fingers. The patient tolerated the procedure well. There were no complications. The patient received written and verbal post procedure care education. Post injection medications were not given.                 ASSESSMENT/PLAN:  Exudative age-related macular degeneration of right eye with active choroidal neovascularization (HCC) Chronic subfoveal disciform scar with subretinal fluid.  Much less intraretinal fluid and some permanent scarring temporally.  At 7-week interval.  We will repeat injection Avastin today and examination again in 7 weeks  Exudative age-related macular degeneration of left eye with active choroidal neovascularization (HCC) 1 week post injection left eye, follow-up as scheduled  Advanced nonexudative age-related macular degeneration of right eye with subfoveal involvement Accounts for visual acuity  Macular pigment epithelial detachment, right As part of subfoveal CNVM, and RPE rip stable      ICD-10-CM   1. Exudative age-related macular degeneration of right eye with active choroidal neovascularization (HCC)  H35.3211 OCT, Retina - OU - Both Eyes    Intravitreal Injection, Pharmacologic Agent - OD - Right Eye    bevacizumab (AVASTIN) SOSY 2.5 mg  2. Exudative age-related macular degeneration of  left eye with active choroidal neovascularization ([place])  H35.3221   3.  Advanced nonexudative age-related macular degeneration of right eye with subfoveal involvement  H35.3114   4. Macular pigment epithelial detachment, right  H35.721     1.  We will repeat injection intravitreal Avastin today and examination right eye in 7 weeks  2.  Left eye, dilate next as scheduled  3.  Ophthalmic Meds Ordered this visit:  Meds ordered this encounter  Medications  . bevacizumab (AVASTIN) SOSY 2.5 mg       Return in about 7 weeks (around 09/30/2020) for dilate, OD, AVASTIN OCT,,, follow-up next OS as scheduled.  There are no Patient Instructions on file for this visit.   Explained the diagnoses, plan, and follow up with the patient and they expressed understanding.  Patient expressed understanding of the importance of proper follow up care.   Clent Demark Kashius Dominic M.D. Diseases & Surgery of the Retina and Vitreous [facility] [place] 08/12/20     Abbreviations: M myopia (nearsighted); A astigmatism; H hyperopia (farsighted); P presbyopia; Mrx spectacle prescription;  CTL contact lenses; OD right eye; OS left eye; OU both eyes  XT exotropia; ET esotropia; PEK punctate epithelial keratitis; PEE punctate epithelial erosions; DES dry eye syndrome; MGD meibomian gland dysfunction; ATs artificial tears; PFAT's preservative free artificial tears; [place] nuclear sclerotic cataract; PSC posterior subcapsular cataract; ERM epi-retinal membrane; PVD posterior vitreous detachment; RD retinal detachment; DM diabetes mellitus; DR diabetic retinopathy; NPDR non-proliferative diabetic retinopathy; PDR proliferative diabetic retinopathy; CSME clinically significant macular edema; DME diabetic macular edema; dbh dot blot hemorrhages; CWS cotton wool spot; POAG primary open angle glaucoma; C/D cup-to-disc ratio; HVF humphrey visual field; GVF goldmann visual field; OCT optical coherence tomography; IOP intraocular pressure; BRVO Branch retinal vein occlusion; CRVO central retinal vein  occlusion; CRAO central retinal artery occlusion; BRAO branch retinal artery occlusion; RT retinal tear; SB scleral buckle; PPV pars plana vitrectomy; VH Vitreous hemorrhage; PRP panretinal laser photocoagulation; IVK intravitreal kenalog; VMT vitreomacular traction; MH Macular hole;  NVD neovascularization of the disc; NVE neovascularization elsewhere; AREDS age related eye disease study; ARMD age related macular degeneration; POAG primary open angle glaucoma; EBMD epithelial/anterior basement membrane dystrophy; ACIOL anterior chamber intraocular lens; IOL intraocular lens; PCIOL posterior chamber intraocular lens; Phaco/IOL phacoemulsification with intraocular lens placement; [place] photorefractive keratectomy; LASIK laser assisted in situ keratomileusis; HTN hypertension; DM diabetes mellitus; COPD chronic obstructive pulmonary disease

## 2020-09-09 ENCOUNTER — Other Ambulatory Visit: Payer: Self-pay

## 2020-09-09 ENCOUNTER — Ambulatory Visit (INDEPENDENT_AMBULATORY_CARE_PROVIDER_SITE_OTHER): Payer: PPO | Admitting: Ophthalmology

## 2020-09-09 ENCOUNTER — Encounter (INDEPENDENT_AMBULATORY_CARE_PROVIDER_SITE_OTHER): Payer: Self-pay | Admitting: Ophthalmology

## 2020-09-09 DIAGNOSIS — H43812 Vitreous degeneration, left eye: Secondary | ICD-10-CM

## 2020-09-09 DIAGNOSIS — H353221 Exudative age-related macular degeneration, left eye, with active choroidal neovascularization: Secondary | ICD-10-CM

## 2020-09-09 MED ORDER — BEVACIZUMAB 2.5 MG/0.1ML IZ SOSY
2.5000 mg | PREFILLED_SYRINGE | INTRAVITREAL | Status: AC | PRN
  Administered 2020-09-09: 2.5 mg via INTRAVITREAL

## 2020-09-09 NOTE — Progress Notes (Signed)
09/09/2020     CHIEF COMPLAINT Patient presents for Retina Follow Up (5 Week Wet AMD f\u OS. Possible Avastin OS. OCT/Pt states vision is about the same. Denies new complaints.)   HISTORY OF PRESENT ILLNESS: [name] is a 87 y.o. female who presents to the clinic today for:   HPI    Retina Follow Up    Patient presents with  Wet AMD.  In left eye.  Severity is moderate.  Duration of 5 weeks.  Since onset it is stable. Additional comments: 5 Week Wet AMD f\u OS. Possible Avastin OS. OCT Pt states vision is about the same. Denies new complaints.       Last edited by Tilda Franco on 09/09/2020  8:33 AM. (History)      Referring physician: Asencion Noble, MD [address] [place],  [place] [address]  HISTORICAL INFORMATION:   Selected notes from the MEDICAL RECORD NUMBER       CURRENT MEDICATIONS: Current Outpatient Medications (Ophthalmic Drugs)  Medication Sig  . dorzolamide-timolol (COSOPT) 22.3-6.8 MG/ML ophthalmic solution Place 1 drop into the right eye 2 (two) times daily.   . Latanoprostene Bunod 0.024 % SOLN Place 1 drop into the right eye at bedtime.    No current facility-administered medications for this visit. (Ophthalmic Drugs)   Current Outpatient Medications (Other)  Medication Sig  . acetaminophen (TYLENOL) 500 MG tablet Take 500 mg by mouth every 6 (six) hours as needed. Patient reports that she may take 1 or 2 times a day for hip pain.  Marland Kitchen albuterol (VENTOLIN HFA) 108 (90 Base) MCG/ACT inhaler Inhale 2 puffs into the lungs every 4 (four) hours as needed.  . carvedilol (COREG) 12.5 MG tablet Take 12.5 mg by mouth 2 (two) times daily with a meal.   . ELIQUIS 5 MG TABS tablet TAKE (1) TABLET BY MOUTH TWICE DAILY FOR BLOOD THINNER.  . furosemide (LASIX) 40 MG tablet TAKE ONE TABLET BY MOUTH ONCE DAILY. (Patient taking differently: Take 40 mg by mouth every other day as needed.)  . losartan (COZAAR) 25 MG tablet Take 1 tablet (25 mg total) by mouth 2  (two) times daily.  . Multiple Vitamins-Minerals (VISION FORMULA EYE HEALTH PO) Take 1 tablet by mouth 2 (two) times daily.   . pantoprazole (PROTONIX) 40 MG tablet Take 1 tablet (40 mg total) by mouth daily before breakfast.  . pramoxine-hydrocortisone (PROCTOCREAM-HC) 1-1 % rectal cream Place 1 application rectally 2 (two) times daily as needed for hemorrhoids or anal itching.   No current facility-administered medications for this visit. (Other)      REVIEW OF SYSTEMS:    ALLERGIES Allergies  Allergen Reactions  . Atorvastatin Other (See Comments)    Myalgias  . Hydrocodone Other (See Comments)    GI distress.    PAST MEDICAL HISTORY Past Medical History:  Diagnosis Date  . Anxiety   . Arteriosclerotic cardiovascular disease (ASCVD)     cath Feb '07- no obstructive dz - 20% proximal LAD only; EF 65%; possible coronary artery spasm  . Atrial fibrillation (HCC)    Paroxysmal on event recorder in 2009; regular supraventricular tachycardia at a rate of 150 also identified on that study representing either atrial flutter or PSVT; onset of persistent AF in 03/2011  . Carcinoma of breast ([place]) 2001   Left mastectomy with positive nodes  . Chest pain    Long-standing and atypical  . Chronic anticoagulation 04/12/2011  . GERD (gastroesophageal reflux disease)  hiatal hernia  . Herpes zoster   . Hyperlipidemia    Lipid profile in 11/2008:282, 176, 64, 201  . Hypertension    diastolic dysfunction; normal CMet and TSH in 11/2009; normal CBC in 04/2010  . Malignant neoplasm of breast (female), unspecified site 05/27/2013   right breast lumpectomy with positive node  . Peripheral neuropathy   . Polio 1946   at age 30  . Rectal bleed   . Renal insufficiency   . Shortness of breath dyspnea   . Vitreous hemorrhage of right eye ([place]) 10/23/2019   Past Surgical History:  Procedure Laterality Date  . BREAST LUMPECTOMY WITH NEEDLE LOCALIZATION AND AXILLARY SENTINEL LYMPH NODE BX  Right 05/27/2013   Procedure: BREAST LUMPECTOMY WITH NEEDLE LOCALIZATION AND AXILLARY SENTINEL LYMPH NODE BX;  Surgeon: Pedro Earls, MD;  Location: [facility];  Service: General;  Laterality: Right;  . BREAST SURGERY    . CHOLECYSTECTOMY  1999  . COLONOSCOPY  02/22/2011   Procedure: COLONOSCOPY;  Surgeon: Rogene Houston, MD;  [facility];  Service: Endoscopy;  Laterality: N/A;; performed for scant hematochezia  . DILATION AND CURETTAGE OF UTERUS    . ESOPHAGOGASTRODUODENOSCOPY  02/22/2011   Procedure: ESOPHAGOGASTRODUODENOSCOPY (EGD);  Surgeon: Rogene Houston, MD;  [facility];  Service: Endoscopy;  Laterality: N/A;  . ESOPHAGOGASTRODUODENOSCOPY N/A 06/02/2014   Procedure: ESOPHAGOGASTRODUODENOSCOPY (EGD);  Surgeon: Rogene Houston, MD;  [facility];  Service: Endoscopy;  Laterality: N/A;  730  . EYE SURGERY     both cataracts  . MALONEY DILATION N/A 06/02/2014   Procedure: Venia Minks DILATION;  Surgeon: Rogene Houston, MD;  [facility];  Service: Endoscopy;  Laterality: N/A;  . MASTECTOMY  2001   Left;modified radical with lymph node dissection  . PORT-A-CATH REMOVAL     pac in and out 2002    FAMILY HISTORY Family History  Problem Relation Age of Onset  . Cancer Sister        lung  . Cancer Brother        lipoma  . Cancer Brother        kidney    SOCIAL HISTORY Social History   Tobacco Use  . Smoking status: Never Smoker  . Smokeless tobacco: Never Used  Vaping Use  . Vaping Use: Never used  Substance Use Topics  . Alcohol use: No    Alcohol/week: 0.0 standard drinks  . Drug use: No         OPHTHALMIC EXAM: Base Eye Exam    Visual Acuity (Snellen - Linear)      Right Left   Dist [place] CF @ 5' 20/60 +1   Dist ph [place] 20/80 -2 20/50 -2       Tonometry (Tonopen, 8:39 AM)      Right Left   Pressure 14 13       Pupils      Pupils Dark Light Shape React APD   Right PERRL 5 4 Round Brisk None    Left PERRL 5 4 Round Brisk None       Visual Fields (Counting fingers)      Left Right    Full Full       Neuro/Psych    Oriented x3: Yes   Mood/Affect: Normal       Dilation    Left eye: 1.0% Mydriacyl, 2.5% Phenylephrine @ 8:39 AM        Slit Lamp and Fundus Exam  External Exam      Right Left   External Normal Normal       Slit Lamp Exam      Right Left   Lids/Lashes Normal Normal   Conjunctiva/Sclera White and quiet White and quiet   Cornea Clear Clear   Anterior Chamber Deep and quiet Deep and quiet   Iris Round and reactive Round and reactive   Lens Posterior chamber intraocular lens Posterior chamber intraocular lens   Anterior Vitreous Normal Normal       Fundus Exam      Right Left   Posterior Vitreous clear , vitrectomized Posterior vitreous detachment   Disc  Normal   C/D Ratio  0.3   Macula  Disciform scar, Drusen, no exudates, no hemorrhage, no macular thickening, Mottling, Retinal pigment epithelial mottling, Atrophy, Retinal pigment epithelial atrophy, Age related macular degeneration, Early age related macular degeneration, Geographic atrophy near the FAZ, Pigmented atrophy, Hard drusen,   Vessels  Normal   Periphery  Normal, no holes          IMAGING AND PROCEDURES  Imaging and Procedures for 09/09/20  OCT, Retina - OU - Both Eyes       Right Eye Quality was good. Scan locations included subfoveal. Central Foveal Thickness: 463. Progression has been stable. Findings include subretinal hyper-reflective material, disciform scar.   Left Eye Quality was good. Scan locations included subfoveal. Central Foveal Thickness: 241. Progression has improved. Findings include abnormal foveal contour, intraretinal fluid.   Notes Disciform macular scar subfoveal OD, yet with less activity currently at 7-week follow-up interval OD, today improved at 4-week interval.  Much less subretinal fluid and intraretinal fluid, follow-up at 7-week.  Yet still  active   OS, 5 week post injection still improving, yet with intraretinal fluid Temporal to the macula       Intravitreal Injection, Pharmacologic Agent - OS - Left Eye       Time Out 09/09/2020. 9:29 AM. Confirmed correct patient, procedure, site, and patient consented.   Anesthesia Topical anesthesia was used. Anesthetic medications included [place]ten 3.5%.   Procedure Preparation included Ofloxacin , Tobramycin 0.3%, 10% betadine to eyelids, 5% betadine to ocular surface. A supplied needle was used.   Injection:  2.5 mg Bevacizumab (AVASTIN) 2.5mg /0.38mL SOSY   NDC: 95621-308-65, Lot: [id number]   Route: Intravitreal, Site: Left Eye  Post-op Post injection exam found visual acuity of at least counting fingers. The patient tolerated the procedure well. There were no complications. The patient received written and verbal post procedure care education. Post injection medications were not given.                 ASSESSMENT/PLAN:  Posterior vitreous detachment of left eye   The nature of posterior vitreous detachment was discussed with the patient as well as its physiology, its age prevalence, and its possible implication regarding retinal breaks and detachment.  An informational brochure was given to the patient.  All the patient's questions were answered.  The patient was asked to return if new or different flashes or floaters develops.   Patient was instructed to contact office immediately if any changes were noticed. I explained to the patient that vitreous inside the eye is similar to jello inside a bowl. As the jello melts it can start to pull away from the bowl, similarly the vitreous throughout our lives can begin to pull away from the retina. That process is called a posterior vitreous detachment. In some cases, the vitreous can  tug hard enough on the retina to form a retinal tear. I discussed with the patient the signs and symptoms of a retinal detachment.  Do not rub the eye.       ICD-10-CM   1. Exudative age-related macular degeneration of left eye with active choroidal neovascularization (HCC)  H35.3221 OCT, Retina - OU - Both Eyes    Intravitreal Injection, Pharmacologic Agent - OS - Left Eye    bevacizumab (AVASTIN) SOSY 2.5 mg  2. Posterior vitreous detachment of left eye  H43.812     1.  Improved macular findings left eye yet still chronically active with intraretinal fluid temporal to the fovea at 5-week post injection interval.  Will need repeat injection OS today and examination again in 5 weeks  2.  OD currently in 4 weeks post injection, subfoveal CNVM by OCT will continue to monitor and follow-up as scheduled  3.  Ophthalmic Meds Ordered this visit:  Meds ordered this encounter  Medications  . bevacizumab (AVASTIN) SOSY 2.5 mg       Return in about 5 weeks (around 10/14/2020) for dilate, OS, AVASTIN OCT.  There are no Patient Instructions on file for this visit.   Explained the diagnoses, plan, and follow up with the patient and they expressed understanding.  Patient expressed understanding of the importance of proper follow up care.   Clent Demark Perl Folmar M.D. Diseases & Surgery of the Retina and Vitreous [facility] 09/09/20     Abbreviations: M myopia (nearsighted); A astigmatism; H hyperopia (farsighted); P presbyopia; Mrx spectacle prescription;  CTL contact lenses; OD right eye; OS left eye; OU both eyes  XT exotropia; ET esotropia; PEK punctate epithelial keratitis; PEE punctate epithelial erosions; DES dry eye syndrome; MGD meibomian gland dysfunction; ATs artificial tears; PFAT's preservative free artificial tears; [place] nuclear sclerotic cataract; PSC posterior subcapsular cataract; ERM epi-retinal membrane; PVD posterior vitreous detachment; RD retinal detachment; DM diabetes mellitus; DR diabetic retinopathy; NPDR non-proliferative diabetic retinopathy; PDR proliferative diabetic retinopathy; CSME clinically significant  macular edema; DME diabetic macular edema; dbh dot blot hemorrhages; CWS cotton wool spot; POAG primary open angle glaucoma; C/D cup-to-disc ratio; HVF humphrey visual field; GVF goldmann visual field; OCT optical coherence tomography; IOP intraocular pressure; BRVO Branch retinal vein occlusion; CRVO central retinal vein occlusion; CRAO central retinal artery occlusion; BRAO branch retinal artery occlusion; RT retinal tear; SB scleral buckle; PPV pars plana vitrectomy; VH Vitreous hemorrhage; PRP panretinal laser photocoagulation; IVK intravitreal kenalog; VMT vitreomacular traction; MH Macular hole;  NVD neovascularization of the disc; NVE neovascularization elsewhere; AREDS age related eye disease study; ARMD age related macular degeneration; POAG primary open angle glaucoma; EBMD epithelial/anterior basement membrane dystrophy; ACIOL anterior chamber intraocular lens; IOL intraocular lens; PCIOL posterior chamber intraocular lens; Phaco/IOL phacoemulsification with intraocular lens placement; [place] photorefractive keratectomy; LASIK laser assisted in situ keratomileusis; HTN hypertension; DM diabetes mellitus; COPD chronic obstructive pulmonary disease

## 2020-09-09 NOTE — Assessment & Plan Note (Signed)

## 2020-09-27 ENCOUNTER — Ambulatory Visit (INDEPENDENT_AMBULATORY_CARE_PROVIDER_SITE_OTHER): Payer: PPO | Admitting: Ophthalmology

## 2020-09-27 ENCOUNTER — Other Ambulatory Visit: Payer: Self-pay

## 2020-09-27 ENCOUNTER — Encounter (INDEPENDENT_AMBULATORY_CARE_PROVIDER_SITE_OTHER): Payer: Self-pay | Admitting: Ophthalmology

## 2020-09-27 DIAGNOSIS — H353211 Exudative age-related macular degeneration, right eye, with active choroidal neovascularization: Secondary | ICD-10-CM

## 2020-09-27 MED ORDER — BEVACIZUMAB 2.5 MG/0.1ML IZ SOSY
2.5000 mg | PREFILLED_SYRINGE | INTRAVITREAL | Status: AC | PRN
  Administered 2020-09-27: 2.5 mg via INTRAVITREAL

## 2020-09-27 NOTE — Assessment & Plan Note (Signed)
OD still active clinically and by OCT Juxta foveal currently at 6-week follow-up.  We will repeat injection today and examination again in 6 weeks

## 2020-09-27 NOTE — Progress Notes (Signed)
09/27/2020     CHIEF COMPLAINT Patient presents for Retina Follow Up (6 Wk F/U OD, poss Avastin OD//Pt c/o fb sensation OD this AM, but sts it has improved. No changes to [place] OU reported.)   HISTORY OF PRESENT ILLNESS: [name] is a 87 y.o. female who presents to the clinic today for:   HPI    Retina Follow Up    Patient presents with  Wet AMD.  In right eye.  This started 6 weeks ago.  Severity is mild.  Duration of 6 weeks.  Since onset it is stable. Additional comments: 6 Wk F/U OD, poss Avastin OD  Pt c/o fb sensation OD this AM, but sts it has improved. No changes to [place] OU reported.       Last edited by Rockie Neighbours, [place] on 09/27/2020  2:17 PM. (History)      Referring physician: Asencion Noble, MD [address] [place],  [place] [address]  HISTORICAL INFORMATION:   Selected notes from the MEDICAL RECORD NUMBER       CURRENT MEDICATIONS: Current Outpatient Medications (Ophthalmic Drugs)  Medication Sig  . dorzolamide-timolol (COSOPT) 22.3-6.8 MG/ML ophthalmic solution Place 1 drop into the right eye 2 (two) times daily.   . Latanoprostene Bunod 0.024 % SOLN Place 1 drop into the right eye at bedtime.    No current facility-administered medications for this visit. (Ophthalmic Drugs)   Current Outpatient Medications (Other)  Medication Sig  . acetaminophen (TYLENOL) 500 MG tablet Take 500 mg by mouth every 6 (six) hours as needed. Patient reports that she may take 1 or 2 times a day for hip pain.  Marland Kitchen albuterol (VENTOLIN HFA) 108 (90 Base) MCG/ACT inhaler Inhale 2 puffs into the lungs every 4 (four) hours as needed.  . carvedilol (COREG) 12.5 MG tablet Take 12.5 mg by mouth 2 (two) times daily with a meal.   . ELIQUIS 5 MG TABS tablet TAKE (1) TABLET BY MOUTH TWICE DAILY FOR BLOOD THINNER.  . furosemide (LASIX) 40 MG tablet TAKE ONE TABLET BY MOUTH ONCE DAILY. (Patient taking differently: Take 40 mg by mouth every other day as needed.)  . losartan (COZAAR) 25  MG tablet Take 1 tablet (25 mg total) by mouth 2 (two) times daily.  . Multiple Vitamins-Minerals (VISION FORMULA EYE HEALTH PO) Take 1 tablet by mouth 2 (two) times daily.   . pantoprazole (PROTONIX) 40 MG tablet Take 1 tablet (40 mg total) by mouth daily before breakfast.  . pramoxine-hydrocortisone (PROCTOCREAM-HC) 1-1 % rectal cream Place 1 application rectally 2 (two) times daily as needed for hemorrhoids or anal itching.   No current facility-administered medications for this visit. (Other)      REVIEW OF SYSTEMS:    ALLERGIES Allergies  Allergen Reactions  . Atorvastatin Other (See Comments)    Myalgias  . Hydrocodone Other (See Comments)    GI distress.    PAST MEDICAL HISTORY Past Medical History:  Diagnosis Date  . Anxiety   . Arteriosclerotic cardiovascular disease (ASCVD)     cath Feb '07- no obstructive dz - 20% proximal LAD only; EF 65%; possible coronary artery spasm  . Atrial fibrillation (HCC)    Paroxysmal on event recorder in 2009; regular supraventricular tachycardia at a rate of 150 also identified on that study representing either atrial flutter or PSVT; onset of persistent AF in 03/2011  . Carcinoma of breast ([place]) 2001   Left mastectomy with positive nodes  . Chest pain  Long-standing and atypical  . Chronic anticoagulation 04/12/2011  . GERD (gastroesophageal reflux disease)    hiatal hernia  . Herpes zoster   . Hyperlipidemia    Lipid profile in 11/2008:282, 176, 64, 201  . Hypertension    diastolic dysfunction; normal CMet and TSH in 11/2009; normal CBC in 04/2010  . Malignant neoplasm of breast (female), unspecified site 05/27/2013   right breast lumpectomy with positive node  . Peripheral neuropathy   . Polio 1946   at age 79  . Rectal bleed   . Renal insufficiency   . Shortness of breath dyspnea   . Vitreous hemorrhage of right eye ([place]) 10/23/2019   Past Surgical History:  Procedure Laterality Date  . BREAST LUMPECTOMY WITH NEEDLE  LOCALIZATION AND AXILLARY SENTINEL LYMPH NODE BX Right 05/27/2013   Procedure: BREAST LUMPECTOMY WITH NEEDLE LOCALIZATION AND AXILLARY SENTINEL LYMPH NODE BX;  Surgeon: Pedro Earls, MD;  Location: [facility];  Service: General;  Laterality: Right;  . BREAST SURGERY    . CHOLECYSTECTOMY  1999  . COLONOSCOPY  02/22/2011   Procedure: COLONOSCOPY;  Surgeon: Rogene Houston, MD;  [facility];  Service: Endoscopy;  Laterality: N/A;; performed for scant hematochezia  . DILATION AND CURETTAGE OF UTERUS    . ESOPHAGOGASTRODUODENOSCOPY  02/22/2011   Procedure: ESOPHAGOGASTRODUODENOSCOPY (EGD);  Surgeon: Rogene Houston, MD;  [facility];  Service: Endoscopy;  Laterality: N/A;  . ESOPHAGOGASTRODUODENOSCOPY N/A 06/02/2014   Procedure: ESOPHAGOGASTRODUODENOSCOPY (EGD);  Surgeon: Rogene Houston, MD;  [facility];  Service: Endoscopy;  Laterality: N/A;  730  . EYE SURGERY     both cataracts  . MALONEY DILATION N/A 06/02/2014   Procedure: Venia Minks DILATION;  Surgeon: Rogene Houston, MD;  [facility];  Service: Endoscopy;  Laterality: N/A;  . MASTECTOMY  2001   Left;modified radical with lymph node dissection  . PORT-A-CATH REMOVAL     pac in and out 2002    FAMILY HISTORY Family History  Problem Relation Age of Onset  . Cancer Sister        lung  . Cancer Brother        lipoma  . Cancer Brother        kidney    SOCIAL HISTORY Social History   Tobacco Use  . Smoking status: Never Smoker  . Smokeless tobacco: Never Used  Vaping Use  . Vaping Use: Never used  Substance Use Topics  . Alcohol use: No    Alcohol/week: 0.0 standard drinks  . Drug use: No         OPHTHALMIC EXAM:  Base Eye Exam    Visual Acuity (ETDRS)      Right Left   Dist [place] 20/400 20/60 +2   Dist ph [place] 20/60ecc -3 20/40 +2       Tonometry (Tonopen, 2:17 PM)      Right Left   Pressure 24 20       Tonometry #2 (Tonopen, 2:23 PM)      Right  Left   Pressure 25        Pupils      Pupils Dark Light Shape React APD   Right PERRL 5 4 Round Brisk None   Left PERRL 5 4 Round Brisk None       Visual Fields (Counting fingers)      Left Right    Full Full       Extraocular Movement  Right Left    Full Full       Neuro/Psych    Oriented x3: Yes   Mood/Affect: Normal       Dilation    Right eye: 1.0% Mydriacyl, 2.5% Phenylephrine @ 2:23 PM        Slit Lamp and Fundus Exam    External Exam      Right Left   External Normal Normal       Slit Lamp Exam      Right Left   Lids/Lashes Normal Normal   Conjunctiva/Sclera White and quiet White and quiet   Cornea Clear Clear   Anterior Chamber Deep and quiet Deep and quiet   Iris Round and reactive Round and reactive   Lens Posterior chamber intraocular lens Posterior chamber intraocular lens   Anterior Vitreous Normal Normal       Fundus Exam      Right Left   Posterior Vitreous Vitrectomized    Disc Normal    C/D Ratio 0.45    Macula Retinal pigment epithelial mottling, Retinal pigment epithelial detachment, Macular thickening, Retinal pigment epithelial atrophy signifying old RPE rip temporal to fovea subfoveal., Intraretinal hemorrhage, persistent subretinal hemorrhage, Geographic atrophy    Vessels Normal    Periphery Normal           IMAGING AND PROCEDURES  Imaging and Procedures for 09/27/20  OCT, Retina - OU - Both Eyes       Right Eye Quality was good. Scan locations included subfoveal. Central Foveal Thickness: 503. Progression has been stable. Findings include subretinal hyper-reflective material, disciform scar.   Left Eye Quality was good. Scan locations included subfoveal. Central Foveal Thickness: 241. Progression has improved. Findings include abnormal foveal contour, intraretinal fluid.   Notes Disciform macular scar subfoveal OD, yet with less activity currently at 6-week follow-up interval OD, repeat injection right eye today to  maintain and prevent progression   OS, 2 week post injection still improving, yet with intraretinal fluid Temporal to the macula       Intravitreal Injection, Pharmacologic Agent - OD - Right Eye       Time Out 09/27/2020. 3:33 PM. Confirmed correct patient, procedure, site, and patient consented.   Anesthesia Topical anesthesia was used. Anesthetic medications included [place] 3.5%.   Procedure Preparation included Tobramycin 0.3%, 10% betadine to eyelids, Ofloxacin . A 30 gauge needle was used.   Injection:  2.5 mg Bevacizumab (AVASTIN) 2.5mg /0.27mL SOSY   NDC: 83382-505-39, Lot: [id number]   Route: Intravitreal, Site: Right Eye  Post-op Post injection exam found visual acuity of at least counting fingers. The patient tolerated the procedure well. There were no complications. The patient received written and verbal post procedure care education. Post injection medications were not given.                 ASSESSMENT/PLAN:  Exudative age-related macular degeneration of right eye with active choroidal neovascularization ([place]) OD still active clinically and by OCT Juxta foveal currently at 6-week follow-up.  We will repeat injection today and examination again in 6 weeks      ICD-10-CM   1. Exudative age-related macular degeneration of right eye with active choroidal neovascularization (HCC)  H35.3211 OCT, Retina - OU - Both Eyes    Intravitreal Injection, Pharmacologic Agent - OD - Right Eye    bevacizumab (AVASTIN) SOSY 2.5 mg    1.  OD for injection intravitreal Avastin today for OCT and clinical evidence of ongoing disease activity.  Follow-up again  dilate OD in 6 weeks  2.  Dilate OS next as scheduled  3.  Ophthalmic Meds Ordered this visit:  Meds ordered this encounter  Medications  . bevacizumab (AVASTIN) SOSY 2.5 mg       Return in about 6 weeks (around 11/08/2020) for dilate, OD, AVASTIN OCT.  There are no Patient Instructions on file for this  visit.   Explained the diagnoses, plan, and follow up with the patient and they expressed understanding.  Patient expressed understanding of the importance of proper follow up care.   Clent Demark Amaira Safley M.D. Diseases & Surgery of the Retina and Vitreous [facility] [place] 09/27/20     Abbreviations: M myopia (nearsighted); A astigmatism; H hyperopia (farsighted); P presbyopia; Mrx spectacle prescription;  CTL contact lenses; OD right eye; OS left eye; OU both eyes  XT exotropia; ET esotropia; PEK punctate epithelial keratitis; PEE punctate epithelial erosions; DES dry eye syndrome; MGD meibomian gland dysfunction; ATs artificial tears; PFAT's preservative free artificial tears; [place] nuclear sclerotic cataract; PSC posterior subcapsular cataract; ERM epi-retinal membrane; PVD posterior vitreous detachment; RD retinal detachment; DM diabetes mellitus; DR diabetic retinopathy; NPDR non-proliferative diabetic retinopathy; PDR proliferative diabetic retinopathy; CSME clinically significant macular edema; DME diabetic macular edema; dbh dot blot hemorrhages; CWS cotton wool spot; POAG primary open angle glaucoma; C/D cup-to-disc ratio; HVF humphrey visual field; GVF goldmann visual field; OCT optical coherence tomography; IOP intraocular pressure; BRVO Branch retinal vein occlusion; CRVO central retinal vein occlusion; CRAO central retinal artery occlusion; BRAO branch retinal artery occlusion; RT retinal tear; SB scleral buckle; PPV pars plana vitrectomy; VH Vitreous hemorrhage; PRP panretinal laser photocoagulation; IVK intravitreal kenalog; VMT vitreomacular traction; MH Macular hole;  NVD neovascularization of the disc; NVE neovascularization elsewhere; AREDS age related eye disease study; ARMD age related macular degeneration; POAG primary open angle glaucoma; EBMD epithelial/anterior basement membrane dystrophy; ACIOL anterior chamber intraocular lens; IOL intraocular lens; PCIOL posterior chamber  intraocular lens; Phaco/IOL phacoemulsification with intraocular lens placement; [place] photorefractive keratectomy; LASIK laser assisted in situ keratomileusis; HTN hypertension; DM diabetes mellitus; COPD chronic obstructive pulmonary disease

## 2020-09-30 ENCOUNTER — Encounter (INDEPENDENT_AMBULATORY_CARE_PROVIDER_SITE_OTHER): Payer: PPO | Admitting: Ophthalmology

## 2020-10-14 ENCOUNTER — Ambulatory Visit (INDEPENDENT_AMBULATORY_CARE_PROVIDER_SITE_OTHER): Payer: PPO | Admitting: Ophthalmology

## 2020-10-14 ENCOUNTER — Encounter (INDEPENDENT_AMBULATORY_CARE_PROVIDER_SITE_OTHER): Payer: Self-pay | Admitting: Ophthalmology

## 2020-10-14 ENCOUNTER — Encounter (INDEPENDENT_AMBULATORY_CARE_PROVIDER_SITE_OTHER): Payer: PPO | Admitting: Ophthalmology

## 2020-10-14 ENCOUNTER — Other Ambulatory Visit: Payer: Self-pay

## 2020-10-14 DIAGNOSIS — H353114 Nonexudative age-related macular degeneration, right eye, advanced atrophic with subfoveal involvement: Secondary | ICD-10-CM | POA: Diagnosis not present

## 2020-10-14 DIAGNOSIS — H353221 Exudative age-related macular degeneration, left eye, with active choroidal neovascularization: Secondary | ICD-10-CM | POA: Diagnosis not present

## 2020-10-14 MED ORDER — BEVACIZUMAB 2.5 MG/0.1ML IZ SOSY
2.5000 mg | PREFILLED_SYRINGE | INTRAVITREAL | Status: AC | PRN
  Administered 2020-10-14: 2.5 mg via INTRAVITREAL

## 2020-10-14 NOTE — Assessment & Plan Note (Signed)
Overall improved on therapy, currently at 5-week interval post injection Avastin still with minor intraretinal fluid On the temporal aspect of the fovea, will continue to monitor and treat as needed repeat Avastin OS today

## 2020-10-14 NOTE — Progress Notes (Signed)
10/14/2020     CHIEF COMPLAINT Patient presents for Retina Follow Up (5 Week Wet AMD f\u OS. Possible Avastin OS. OCT/Pt states vision is about the same. Denies new floaters and FOL.)   HISTORY OF PRESENT ILLNESS: [name] is a 87 y.o. female who presents to the clinic today for:   HPI    Retina Follow Up    Patient presents with  Wet AMD.  In left eye.  Severity is moderate.  Duration of 5 weeks.  Since onset it is stable.  I, the attending physician,  performed the HPI with the patient and updated documentation appropriately. Additional comments: 5 Week Wet AMD f\u OS. Possible Avastin OS. OCT Pt states vision is about the same. Denies new floaters and FOL.          Comments    No interval change in acuity OS noted       Last edited by Hurman Horn, MD on 10/14/2020  3:37 PM. (History)      Referring physician: Asencion Noble, MD [address] [place],  [place] [address]  HISTORICAL INFORMATION:   Selected notes from the MEDICAL RECORD NUMBER       CURRENT MEDICATIONS: Current Outpatient Medications (Ophthalmic Drugs)  Medication Sig  . dorzolamide-timolol (COSOPT) 22.3-6.8 MG/ML ophthalmic solution Place 1 drop into the right eye 2 (two) times daily.   . Latanoprostene Bunod 0.024 % SOLN Place 1 drop into the right eye at bedtime.    No current facility-administered medications for this visit. (Ophthalmic Drugs)   Current Outpatient Medications (Other)  Medication Sig  . acetaminophen (TYLENOL) 500 MG tablet Take 500 mg by mouth every 6 (six) hours as needed. Patient reports that she may take 1 or 2 times a day for hip pain.  Marland Kitchen albuterol (VENTOLIN HFA) 108 (90 Base) MCG/ACT inhaler Inhale 2 puffs into the lungs every 4 (four) hours as needed.  . carvedilol (COREG) 12.5 MG tablet Take 12.5 mg by mouth 2 (two) times daily with a meal.   . ELIQUIS 5 MG TABS tablet TAKE (1) TABLET BY MOUTH TWICE DAILY FOR BLOOD THINNER.  . furosemide (LASIX) 40 MG tablet TAKE  ONE TABLET BY MOUTH ONCE DAILY. (Patient taking differently: Take 40 mg by mouth every other day as needed.)  . losartan (COZAAR) 25 MG tablet Take 1 tablet (25 mg total) by mouth 2 (two) times daily.  . Multiple Vitamins-Minerals (VISION FORMULA EYE HEALTH PO) Take 1 tablet by mouth 2 (two) times daily.   . pantoprazole (PROTONIX) 40 MG tablet Take 1 tablet (40 mg total) by mouth daily before breakfast.  . pramoxine-hydrocortisone (PROCTOCREAM-HC) 1-1 % rectal cream Place 1 application rectally 2 (two) times daily as needed for hemorrhoids or anal itching.   No current facility-administered medications for this visit. (Other)      REVIEW OF SYSTEMS:    ALLERGIES Allergies  Allergen Reactions  . Atorvastatin Other (See Comments)    Myalgias  . Hydrocodone Other (See Comments)    GI distress.    PAST MEDICAL HISTORY Past Medical History:  Diagnosis Date  . Anxiety   . Arteriosclerotic cardiovascular disease (ASCVD)     cath Feb '07- no obstructive dz - 20% proximal LAD only; EF 65%; possible coronary artery spasm  . Atrial fibrillation (HCC)    Paroxysmal on event recorder in 2009; regular supraventricular tachycardia at a rate of 150 also identified on that study representing either atrial flutter or PSVT; onset of  persistent AF in 03/2011  . Carcinoma of breast ([place]) 2001   Left mastectomy with positive nodes  . Chest pain    Long-standing and atypical  . Chronic anticoagulation 04/12/2011  . GERD (gastroesophageal reflux disease)    hiatal hernia  . Herpes zoster   . Hyperlipidemia    Lipid profile in 11/2008:282, 176, 64, 201  . Hypertension    diastolic dysfunction; normal CMet and TSH in 11/2009; normal CBC in 04/2010  . Malignant neoplasm of breast (female), unspecified site 05/27/2013   right breast lumpectomy with positive node  . Peripheral neuropathy   . Polio 1946   at age 35  . Rectal bleed   . Renal insufficiency   . Shortness of breath dyspnea   .  Vitreous hemorrhage of right eye ([place]) 10/23/2019   Past Surgical History:  Procedure Laterality Date  . BREAST LUMPECTOMY WITH NEEDLE LOCALIZATION AND AXILLARY SENTINEL LYMPH NODE BX Right 05/27/2013   Procedure: BREAST LUMPECTOMY WITH NEEDLE LOCALIZATION AND AXILLARY SENTINEL LYMPH NODE BX;  Surgeon: Pedro Earls, MD;  Location: [facility];  Service: General;  Laterality: Right;  . BREAST SURGERY    . CHOLECYSTECTOMY  1999  . COLONOSCOPY  02/22/2011   Procedure: COLONOSCOPY;  Surgeon: Rogene Houston, MD;  [facility];  Service: Endoscopy;  Laterality: N/A;; performed for scant hematochezia  . DILATION AND CURETTAGE OF UTERUS    . ESOPHAGOGASTRODUODENOSCOPY  02/22/2011   Procedure: ESOPHAGOGASTRODUODENOSCOPY (EGD);  Surgeon: Rogene Houston, MD;  [facility];  Service: Endoscopy;  Laterality: N/A;  . ESOPHAGOGASTRODUODENOSCOPY N/A 06/02/2014   Procedure: ESOPHAGOGASTRODUODENOSCOPY (EGD);  Surgeon: Rogene Houston, MD;  [facility];  Service: Endoscopy;  Laterality: N/A;  730  . EYE SURGERY     both cataracts  . MALONEY DILATION N/A 06/02/2014   Procedure: Venia Minks DILATION;  Surgeon: Rogene Houston, MD;  [facility];  Service: Endoscopy;  Laterality: N/A;  . MASTECTOMY  2001   Left;modified radical with lymph node dissection  . PORT-A-CATH REMOVAL     pac in and out 2002    FAMILY HISTORY Family History  Problem Relation Age of Onset  . Cancer Sister        lung  . Cancer Brother        lipoma  . Cancer Brother        kidney    SOCIAL HISTORY Social History   Tobacco Use  . Smoking status: Never Smoker  . Smokeless tobacco: Never Used  Vaping Use  . Vaping Use: Never used  Substance Use Topics  . Alcohol use: No    Alcohol/week: 0.0 standard drinks  . Drug use: No         OPHTHALMIC EXAM: Base Eye Exam    Visual Acuity (Snellen - Linear)      Right Left   Dist [place] 20/400 +1 20/70 +2   Dist ph [place]  20/80 -1 20/50 -1       Tonometry (Tonopen, 2:53 PM)      Right Left   Pressure 19 13       Pupils      Pupils Dark Light Shape React APD   Right PERRL 5 4 Round Brisk None   Left PERRL 5 4 Round Brisk None       Visual Fields (Counting fingers)      Left Right    Full Full       Neuro/Psych  Oriented x3: Yes   Mood/Affect: Normal       Dilation    Left eye: 1.0% Mydriacyl, 2.5% Phenylephrine @ 2:53 PM        Slit Lamp and Fundus Exam    External Exam      Right Left   External Normal Normal       Slit Lamp Exam      Right Left   Lids/Lashes Normal Normal   Conjunctiva/Sclera White and quiet White and quiet   Cornea Clear Clear   Anterior Chamber Deep and quiet Deep and quiet   Iris Round and reactive Round and reactive   Lens Posterior chamber intraocular lens Posterior chamber intraocular lens   Anterior Vitreous Normal Normal       Fundus Exam      Right Left   Posterior Vitreous clear , vitrectomized Posterior vitreous detachment   Disc  Normal   C/D Ratio  0.3   Macula  Disciform scar, Drusen, no exudates, no hemorrhage, no macular thickening, Mottling, Retinal pigment epithelial mottling, Atrophy, Retinal pigment epithelial atrophy, Age related macular degeneration, Early age related macular degeneration, Geographic atrophy near the FAZ, Pigmented atrophy, Hard drusen,   Vessels  Normal   Periphery  Normal, no holes          IMAGING AND PROCEDURES  Imaging and Procedures for 10/14/20  OCT, Retina - OU - Both Eyes       Right Eye Quality was good. Scan locations included subfoveal. Central Foveal Thickness: 478. Progression has been stable. Findings include subretinal hyper-reflective material, disciform scar.   Left Eye Quality was good. Scan locations included subfoveal. Central Foveal Thickness: 260. Progression has improved. Findings include abnormal foveal contour, intraretinal fluid.   Notes Disciform macular scar subfoveal OD, yet  with less activity currently at 2.5 -week follow-up interval OD, repeat injection right eye today to maintain and prevent progression   OS, 5 week post injection still improving, yet with intraretinal fluid Temporal to the macula, repeat injection today to maintain       Intravitreal Injection, Pharmacologic Agent - OS - Left Eye       Time Out 10/14/2020. 3:36 PM. Confirmed correct patient, procedure, site, and patient consented.   Anesthesia Topical anesthesia was used. Anesthetic medications included [place] 3.5%.   Procedure Preparation included Ofloxacin , Tobramycin 0.3%, 10% betadine to eyelids, 5% betadine to ocular surface. A supplied needle was used.   Injection:  2.5 mg Bevacizumab (AVASTIN) 2.5mg /0.36mL SOSY   NDC: 33545-625-63, Lot: [id number]   Route: Intravitreal, Site: Left Eye  Post-op Post injection exam found visual acuity of at least counting fingers. The patient tolerated the procedure well. There were no complications. The patient received written and verbal post procedure care education. Post injection medications were not given.                 ASSESSMENT/PLAN:  Exudative age-related macular degeneration of left eye with active choroidal neovascularization (HCC) Overall improved on therapy, currently at 5-week interval post injection Avastin still with minor intraretinal fluid On the temporal aspect of the fovea, will continue to monitor and treat as needed repeat Avastin OS today  Advanced nonexudative age-related macular degeneration of right eye with subfoveal involvement Irregular contour to macula, currently at 2.5 weeks post injection we will continue to follow as scheduled OD      ICD-10-CM   1. Exudative age-related macular degeneration of left eye with active choroidal neovascularization ([place])  H35.3221 OCT, Retina -  OU - Both Eyes    Intravitreal Injection, Pharmacologic Agent - OS - Left Eye    bevacizumab (AVASTIN) SOSY 2.5 mg  2. Advanced  nonexudative age-related macular degeneration of right eye with subfoveal involvement  H35.3114     1.  OS, much improved at 5 weeks yet still active disease, repeat intravitreal Avastin for CNVM OS by  2.  OD next as scheduled  3.  Ophthalmic Meds Ordered this visit:  Meds ordered this encounter  Medications  . bevacizumab (AVASTIN) SOSY 2.5 mg       Return for RV 5 to 7 weeks, dilate, OS, AVASTIN OCT,,  and OD as scheduled.  There are no Patient Instructions on file for this visit.   Explained the diagnoses, plan, and follow up with the patient and they expressed understanding.  Patient expressed understanding of the importance of proper follow up care.   Clent Demark Bevan Vu M.D. Diseases & Surgery of the Retina and Vitreous [facility] [place] 10/14/20     Abbreviations: M myopia (nearsighted); A astigmatism; H hyperopia (farsighted); P presbyopia; Mrx spectacle prescription;  CTL contact lenses; OD right eye; OS left eye; OU both eyes  XT exotropia; ET esotropia; PEK punctate epithelial keratitis; PEE punctate epithelial erosions; DES dry eye syndrome; MGD meibomian gland dysfunction; ATs artificial tears; PFAT's preservative free artificial tears; [place] nuclear sclerotic cataract; PSC posterior subcapsular cataract; ERM epi-retinal membrane; PVD posterior vitreous detachment; RD retinal detachment; DM diabetes mellitus; DR diabetic retinopathy; NPDR non-proliferative diabetic retinopathy; PDR proliferative diabetic retinopathy; CSME clinically significant macular edema; DME diabetic macular edema; dbh dot blot hemorrhages; CWS cotton wool spot; POAG primary open angle glaucoma; C/D cup-to-disc ratio; HVF humphrey visual field; GVF goldmann visual field; OCT optical coherence tomography; IOP intraocular pressure; BRVO Branch retinal vein occlusion; CRVO central retinal vein occlusion; CRAO central retinal artery occlusion; BRAO branch retinal artery occlusion; RT retinal tear;  SB scleral buckle; PPV pars plana vitrectomy; VH Vitreous hemorrhage; PRP panretinal laser photocoagulation; IVK intravitreal kenalog; VMT vitreomacular traction; MH Macular hole;  NVD neovascularization of the disc; NVE neovascularization elsewhere; AREDS age related eye disease study; ARMD age related macular degeneration; POAG primary open angle glaucoma; EBMD epithelial/anterior basement membrane dystrophy; ACIOL anterior chamber intraocular lens; IOL intraocular lens; PCIOL posterior chamber intraocular lens; Phaco/IOL phacoemulsification with intraocular lens placement; [place] photorefractive keratectomy; LASIK laser assisted in situ keratomileusis; HTN hypertension; DM diabetes mellitus; COPD chronic obstructive pulmonary disease

## 2020-10-14 NOTE — Assessment & Plan Note (Signed)
Irregular contour to macula, currently at 2.5 weeks post injection we will continue to follow as scheduled OD

## 2020-11-01 DIAGNOSIS — Z79899 Other long term (current) drug therapy: Secondary | ICD-10-CM | POA: Diagnosis not present

## 2020-11-01 DIAGNOSIS — I482 Chronic atrial fibrillation, unspecified: Secondary | ICD-10-CM | POA: Diagnosis not present

## 2020-11-01 DIAGNOSIS — R739 Hyperglycemia, unspecified: Secondary | ICD-10-CM | POA: Diagnosis not present

## 2020-11-01 DIAGNOSIS — I5089 Other heart failure: Secondary | ICD-10-CM | POA: Diagnosis not present

## 2020-11-02 ENCOUNTER — Other Ambulatory Visit: Payer: Self-pay | Admitting: Internal Medicine

## 2020-11-03 NOTE — Telephone Encounter (Signed)
This is a [place] pt.  °

## 2020-11-08 DIAGNOSIS — R7309 Other abnormal glucose: Secondary | ICD-10-CM | POA: Diagnosis not present

## 2020-11-08 DIAGNOSIS — I5030 Unspecified diastolic (congestive) heart failure: Secondary | ICD-10-CM | POA: Diagnosis not present

## 2020-11-08 DIAGNOSIS — I4821 Permanent atrial fibrillation: Secondary | ICD-10-CM | POA: Diagnosis not present

## 2020-11-15 ENCOUNTER — Encounter (INDEPENDENT_AMBULATORY_CARE_PROVIDER_SITE_OTHER): Payer: PPO | Admitting: Ophthalmology

## 2020-11-18 ENCOUNTER — Other Ambulatory Visit: Payer: Self-pay

## 2020-11-18 ENCOUNTER — Ambulatory Visit (INDEPENDENT_AMBULATORY_CARE_PROVIDER_SITE_OTHER): Payer: PPO | Admitting: Ophthalmology

## 2020-11-18 ENCOUNTER — Encounter (INDEPENDENT_AMBULATORY_CARE_PROVIDER_SITE_OTHER): Payer: Self-pay | Admitting: Ophthalmology

## 2020-11-18 DIAGNOSIS — H353221 Exudative age-related macular degeneration, left eye, with active choroidal neovascularization: Secondary | ICD-10-CM

## 2020-11-18 DIAGNOSIS — H353211 Exudative age-related macular degeneration, right eye, with active choroidal neovascularization: Secondary | ICD-10-CM | POA: Diagnosis not present

## 2020-11-18 DIAGNOSIS — H353114 Nonexudative age-related macular degeneration, right eye, advanced atrophic with subfoveal involvement: Secondary | ICD-10-CM | POA: Diagnosis not present

## 2020-11-18 MED ORDER — BEVACIZUMAB 2.5 MG/0.1ML IZ SOSY
2.5000 mg | PREFILLED_SYRINGE | INTRAVITREAL | Status: AC | PRN
  Administered 2020-11-18: 2.5 mg via INTRAVITREAL

## 2020-11-18 NOTE — Assessment & Plan Note (Signed)
5-week post injection left eye, follow-up as scheduled

## 2020-11-18 NOTE — Assessment & Plan Note (Signed)
Much less center involved macular thickening and subretinal fluid now some 7 weeks post most recent injection Avastin OD.  Stabilize visual acuity.  We will repeat injection today and maintain 6 to 7-week interval of examination right eye

## 2020-11-18 NOTE — Progress Notes (Signed)
11/18/2020     CHIEF COMPLAINT Patient presents for Retina Follow Up (6wk fu OD/ Avastin OD/Pt states [facility] OU stable since last visit. Pt denies FOL, floaters, or ocular pain OU. /Pt reports taking Cosopt BID OD)   HISTORY OF PRESENT ILLNESS: [name] is a 87 y.o. female who presents to the clinic today for:   HPI    Retina Follow Up    Diagnosis: Wet AMD   Laterality: right eye   Onset: 6 weeks ago   Severity: mild   Duration: 6 weeks   Course: stable   Comments: 6wk fu OD/ Avastin OD Pt states VA OU stable since last visit. Pt denies FOL, floaters, or ocular pain OU.  Pt reports taking Cosopt BID OD       Last edited by Kendra Opitz, COA on 11/18/2020  3:59 PM. (History)      Referring physician: Asencion Noble, MD [address] [place],  [place] [address]  HISTORICAL INFORMATION:   Selected notes from the MEDICAL RECORD NUMBER       CURRENT MEDICATIONS: Current Outpatient Medications (Ophthalmic Drugs)  Medication Sig  . dorzolamide-timolol (COSOPT) 22.3-6.8 MG/ML ophthalmic solution Place 1 drop into the right eye 2 (two) times daily.   . Latanoprostene Bunod 0.024 % SOLN Place 1 drop into the right eye at bedtime.    No current facility-administered medications for this visit. (Ophthalmic Drugs)   Current Outpatient Medications (Other)  Medication Sig  . acetaminophen (TYLENOL) 500 MG tablet Take 500 mg by mouth every 6 (six) hours as needed. Patient reports that she may take 1 or 2 times a day for hip pain.  Marland Kitchen albuterol (VENTOLIN HFA) 108 (90 Base) MCG/ACT inhaler Inhale 2 puffs into the lungs every 4 (four) hours as needed.  . carvedilol (COREG) 12.5 MG tablet Take 12.5 mg by mouth 2 (two) times daily with a meal.   . ELIQUIS 5 MG TABS tablet TAKE (1) TABLET BY MOUTH TWICE DAILY FOR BLOOD THINNER.  . furosemide (LASIX) 40 MG tablet TAKE ONE TABLET BY MOUTH ONCE DAILY.  Marland Kitchen losartan (COZAAR) 25 MG tablet Take 1 tablet (25 mg total) by mouth 2 (two)  times daily.  . Multiple Vitamins-Minerals (VISION FORMULA EYE HEALTH PO) Take 1 tablet by mouth 2 (two) times daily.   . pantoprazole (PROTONIX) 40 MG tablet Take 1 tablet (40 mg total) by mouth daily before breakfast.  . pramoxine-hydrocortisone (PROCTOCREAM-HC) 1-1 % rectal cream Place 1 application rectally 2 (two) times daily as needed for hemorrhoids or anal itching.   No current facility-administered medications for this visit. (Other)      REVIEW OF SYSTEMS:    ALLERGIES Allergies  Allergen Reactions  . Atorvastatin Other (See Comments)    Myalgias  . Hydrocodone Other (See Comments)    GI distress.    PAST MEDICAL HISTORY Past Medical History:  Diagnosis Date  . Anxiety   . Arteriosclerotic cardiovascular disease (ASCVD)     cath Feb '07- no obstructive dz - 20% proximal LAD only; EF 65%; possible coronary artery spasm  . Atrial fibrillation (HCC)    Paroxysmal on event recorder in 2009; regular supraventricular tachycardia at a rate of 150 also identified on that study representing either atrial flutter or PSVT; onset of persistent AF in 03/2011  . Carcinoma of breast ([place]) 2001   Left mastectomy with positive nodes  . Chest pain    Long-standing and atypical  . Chronic anticoagulation 04/12/2011  .  GERD (gastroesophageal reflux disease)    hiatal hernia  . Herpes zoster   . Hyperlipidemia    Lipid profile in 11/2008:282, 176, 64, 201  . Hypertension    diastolic dysfunction; normal CMet and TSH in 11/2009; normal CBC in 04/2010  . Malignant neoplasm of breast (female), unspecified site 05/27/2013   right breast lumpectomy with positive node  . Peripheral neuropathy   . Polio 1946   at age 89  . Rectal bleed   . Renal insufficiency   . Shortness of breath dyspnea   . Vitreous hemorrhage of right eye ([facility]) 10/23/2019   Past Surgical History:  Procedure Laterality Date  . BREAST LUMPECTOMY WITH NEEDLE LOCALIZATION AND AXILLARY SENTINEL LYMPH NODE BX Right  05/27/2013   Procedure: BREAST LUMPECTOMY WITH NEEDLE LOCALIZATION AND AXILLARY SENTINEL LYMPH NODE BX;  Surgeon: Pedro Earls, MD;  Location: [facility];  Service: General;  Laterality: Right;  . BREAST SURGERY    . CHOLECYSTECTOMY  1999  . COLONOSCOPY  02/22/2011   Procedure: COLONOSCOPY;  Surgeon: Rogene Houston, MD;  [facility];  Service: Endoscopy;  Laterality: N/A;; performed for scant hematochezia  . DILATION AND CURETTAGE OF UTERUS    . ESOPHAGOGASTRODUODENOSCOPY  02/22/2011   Procedure: ESOPHAGOGASTRODUODENOSCOPY (EGD);  Surgeon: Rogene Houston, MD;  [facility];  Service: Endoscopy;  Laterality: N/A;  . ESOPHAGOGASTRODUODENOSCOPY N/A 06/02/2014   Procedure: ESOPHAGOGASTRODUODENOSCOPY (EGD);  Surgeon: Rogene Houston, MD;  [facility];  Service: Endoscopy;  Laterality: N/A;  730  . EYE SURGERY     both cataracts  . MALONEY DILATION N/A 06/02/2014   Procedure: Venia Minks DILATION;  Surgeon: Rogene Houston, MD;  [facility];  Service: Endoscopy;  Laterality: N/A;  . MASTECTOMY  2001   Left;modified radical with lymph node dissection  . PORT-A-CATH REMOVAL     pac in and out 2002    FAMILY HISTORY Family History  Problem Relation Age of Onset  . Cancer Sister        lung  . Cancer Brother        lipoma  . Cancer Brother        kidney    SOCIAL HISTORY Social History   Tobacco Use  . Smoking status: Never Smoker  . Smokeless tobacco: Never Used  Vaping Use  . Vaping Use: Never used  Substance Use Topics  . Alcohol use: No    Alcohol/week: 0.0 standard drinks  . Drug use: No         OPHTHALMIC EXAM: Base Eye Exam    Visual Acuity (ETDRS)      Right Left   Dist [place] 20/80 -2 20/50 -2   Dist ph [place] 20/70 NI       Tonometry (Tonopen, 4:02 PM)      Right Left   Pressure 24 19       Pupils      Pupils Dark Light Shape React APD   Right PERRL 5 4 Round Brisk None   Left PERRL 5 4 Round Brisk  None       Visual Fields (Counting fingers)      Left Right    Full Full       Extraocular Movement      Right Left    Full Full       Neuro/Psych    Oriented x3: Yes   Mood/Affect: Normal       Dilation  Right eye: 1.0% Mydriacyl, 2.5% Phenylephrine @ 4:02 PM        Slit Lamp and Fundus Exam    External Exam      Right Left   External Normal Normal       Slit Lamp Exam      Right Left   Lids/Lashes Normal Normal   Conjunctiva/Sclera White and quiet White and quiet   Cornea Clear Clear   Anterior Chamber Deep and quiet Deep and quiet   Iris Round and reactive Round and reactive   Lens Posterior chamber intraocular lens Posterior chamber intraocular lens   Anterior Vitreous Normal Normal       Fundus Exam      Right Left   Posterior Vitreous Vitrectomized    Disc Normal    C/D Ratio 0.45    Macula Retinal pigment epithelial mottling, Retinal pigment epithelial detachment, Macular thickening, Retinal pigment epithelial atrophy signifying old RPE rip temporal to fovea subfoveal., Intraretinal hemorrhage, persistent subretinal hemorrhage, Geographic atrophy    Vessels Normal    Periphery Normal           IMAGING AND PROCEDURES  Imaging and Procedures for 11/18/20  OCT, Retina - OU - Both Eyes       Right Eye Quality was good. Scan locations included subfoveal. Central Foveal Thickness: 312. Progression has been stable. Findings include subretinal hyper-reflective material, disciform scar.   Left Eye Quality was good. Scan locations included subfoveal. Central Foveal Thickness: 2163. Progression has improved. Findings include abnormal foveal contour, intraretinal fluid.   Notes Disciform macular scar subfoveal OD, yet with less activity currently at 7 -week follow-up interval OD, repeat injection right eye today to maintain and prevent progression   OS, 5 week post injection still improving, yet with intraretinal fluid Temporal to the macula, repeat  injection today to maintain       Intravitreal Injection, Pharmacologic Agent - OD - Right Eye       Time Out 11/18/2020. 4:15 PM. Confirmed correct patient, procedure, site, and patient consented.   Anesthesia Topical anesthesia was used. Anesthetic medications included [place] 3.5%.   Procedure Preparation included Tobramycin 0.3%, 10% betadine to eyelids, Ofloxacin . A 30 gauge needle was used.   Injection:  2.5 mg Bevacizumab (AVASTIN) 2.5mg /0.20mL SOSY   NDC: 53664-403-47, Lot: [id number]   Route: Intravitreal, Site: Right Eye  Post-op Post injection exam found visual acuity of at least counting fingers. The patient tolerated the procedure well. There were no complications. The patient received written and verbal post procedure care education. Post injection medications were not given.                 ASSESSMENT/PLAN:  Exudative age-related macular degeneration of right eye with active choroidal neovascularization (HCC) Much less center involved macular thickening and subretinal fluid now some 7 weeks post most recent injection Avastin OD.  Stabilize visual acuity.  We will repeat injection today and maintain 6 to 7-week interval of examination right eye  Exudative age-related macular degeneration of left eye with active choroidal neovascularization ([place]) 5-week post injection left eye, follow-up as scheduled  Advanced nonexudative age-related macular degeneration of right eye with subfoveal involvement Some effect on acuity OD      ICD-10-CM   1. Exudative age-related macular degeneration of right eye with active choroidal neovascularization (HCC)  H35.3211 Intravitreal Injection, Pharmacologic Agent - OD - Right Eye    bevacizumab (AVASTIN) SOSY 2.5 mg  2. Advanced nonexudative age-related macular degeneration of right  eye with subfoveal involvement  H35.3114 OCT, Retina - OU - Both Eyes  3. Exudative age-related macular degeneration of left eye with active choroidal  neovascularization ([place])  H35.3221     1.  OD, much less center involved disease activity post Avastin currently at 6-week follow-up interval.  We will repeat injection Avastin OD today and examination again in 6 weeks  2.  Dilate OS next as scheduled roughly 2 weeks  3.  Ophthalmic Meds Ordered this visit:  Meds ordered this encounter  Medications  . bevacizumab (AVASTIN) SOSY 2.5 mg       Return in about 6 weeks (around 12/30/2020) for dilate, OD, AVASTIN OCT,, and follow-up OS as scheduled.  There are no Patient Instructions on file for this visit.   Explained the diagnoses, plan, and follow up with the patient and they expressed understanding.  Patient expressed understanding of the importance of proper follow up care.   Clent Demark Tanveer Brammer M.D. Diseases & Surgery of the Retina and Vitreous [facility] 11/18/20     Abbreviations: M myopia (nearsighted); A astigmatism; H hyperopia (farsighted); P presbyopia; Mrx spectacle prescription;  CTL contact lenses; OD right eye; OS left eye; OU both eyes  XT exotropia; ET esotropia; PEK punctate epithelial keratitis; PEE punctate epithelial erosions; DES dry eye syndrome; MGD meibomian gland dysfunction; ATs artificial tears; PFAT's preservative free artificial tears; [place] nuclear sclerotic cataract; PSC posterior subcapsular cataract; ERM epi-retinal membrane; PVD posterior vitreous detachment; RD retinal detachment; DM diabetes mellitus; DR diabetic retinopathy; NPDR non-proliferative diabetic retinopathy; PDR proliferative diabetic retinopathy; CSME clinically significant macular edema; DME diabetic macular edema; dbh dot blot hemorrhages; CWS cotton wool spot; POAG primary open angle glaucoma; C/D cup-to-disc ratio; HVF humphrey visual field; GVF goldmann visual field; OCT optical coherence tomography; IOP intraocular pressure; BRVO Branch retinal vein occlusion; CRVO central retinal vein occlusion; CRAO central retinal  artery occlusion; BRAO branch retinal artery occlusion; RT retinal tear; SB scleral buckle; PPV pars plana vitrectomy; VH Vitreous hemorrhage; PRP panretinal laser photocoagulation; IVK intravitreal kenalog; VMT vitreomacular traction; MH Macular hole;  NVD neovascularization of the disc; NVE neovascularization elsewhere; AREDS age related eye disease study; ARMD age related macular degeneration; POAG primary open angle glaucoma; EBMD epithelial/anterior basement membrane dystrophy; ACIOL anterior chamber intraocular lens; IOL intraocular lens; PCIOL posterior chamber intraocular lens; Phaco/IOL phacoemulsification with intraocular lens placement; [place] photorefractive keratectomy; LASIK laser assisted in situ keratomileusis; HTN hypertension; DM diabetes mellitus; COPD chronic obstructive pulmonary disease

## 2020-11-18 NOTE — Assessment & Plan Note (Signed)
Some effect on acuity OD

## 2020-11-30 ENCOUNTER — Ambulatory Visit (INDEPENDENT_AMBULATORY_CARE_PROVIDER_SITE_OTHER): Payer: PPO | Admitting: Ophthalmology

## 2020-11-30 ENCOUNTER — Encounter (INDEPENDENT_AMBULATORY_CARE_PROVIDER_SITE_OTHER): Payer: Self-pay | Admitting: Ophthalmology

## 2020-11-30 ENCOUNTER — Other Ambulatory Visit: Payer: Self-pay

## 2020-11-30 DIAGNOSIS — H353221 Exudative age-related macular degeneration, left eye, with active choroidal neovascularization: Secondary | ICD-10-CM

## 2020-11-30 DIAGNOSIS — H353114 Nonexudative age-related macular degeneration, right eye, advanced atrophic with subfoveal involvement: Secondary | ICD-10-CM

## 2020-11-30 DIAGNOSIS — H43812 Vitreous degeneration, left eye: Secondary | ICD-10-CM | POA: Diagnosis not present

## 2020-11-30 MED ORDER — BEVACIZUMAB 2.5 MG/0.1ML IZ SOSY
2.5000 mg | PREFILLED_SYRINGE | INTRAVITREAL | Status: AC | PRN
  Administered 2020-11-30: 2.5 mg via INTRAVITREAL

## 2020-11-30 NOTE — Assessment & Plan Note (Signed)

## 2020-11-30 NOTE — Assessment & Plan Note (Signed)
Today at 6.5-week postinjection, with persistent intraretinal fluid temporally.  We will repeat intravitreal Avastin OS today and examination next in 6 weeks

## 2020-11-30 NOTE — Progress Notes (Signed)
11/30/2020     CHIEF COMPLAINT Patient presents for Retina Follow Up (6 week fu OS and Avastin OS/Pt states VA OU stable since last visit. Pt denies FOL, floaters, or ocular pain OU. /Pt reports using Cosopt BID OD and Latanoprost QHS OD)   HISTORY OF PRESENT ILLNESS: [name] is a 87 y.o. female who presents to the clinic today for:   HPI    Retina Follow Up    Diagnosis: Wet AMD   Laterality: left eye   Onset: 6 weeks ago   Severity: mild   Duration: 6 weeks   Comments: 6 week fu OS and Avastin OS Pt states VA OU stable since last visit. Pt denies FOL, floaters, or ocular pain OU.  Pt reports using Cosopt BID OD and Latanoprost QHS OD       Last edited by Kendra Opitz, COA on 11/30/2020  8:25 AM. (History)      Referring physician: Asencion Noble, MD [address] [place],  [place] [id number]  HISTORICAL INFORMATION:   Selected notes from the MEDICAL RECORD NUMBER       CURRENT MEDICATIONS: Current Outpatient Medications (Ophthalmic Drugs)  Medication Sig  . dorzolamide-timolol (COSOPT) 22.3-6.8 MG/ML ophthalmic solution Place 1 drop into the right eye 2 (two) times daily.   . Latanoprostene Bunod 0.024 % SOLN Place 1 drop into the right eye at bedtime.    No current facility-administered medications for this visit. (Ophthalmic Drugs)   Current Outpatient Medications (Other)  Medication Sig  . acetaminophen (TYLENOL) 500 MG tablet Take 500 mg by mouth every 6 (six) hours as needed. Patient reports that she may take 1 or 2 times a day for hip pain.  Marland Kitchen albuterol (VENTOLIN HFA) 108 (90 Base) MCG/ACT inhaler Inhale 2 puffs into the lungs every 4 (four) hours as needed.  . carvedilol (COREG) 12.5 MG tablet Take 12.5 mg by mouth 2 (two) times daily with a meal.   . ELIQUIS 5 MG TABS tablet TAKE (1) TABLET BY MOUTH TWICE DAILY FOR BLOOD THINNER.  . furosemide (LASIX) 40 MG tablet TAKE ONE TABLET BY MOUTH ONCE DAILY.  Marland Kitchen losartan (COZAAR) 25 MG tablet Take 1  tablet (25 mg total) by mouth 2 (two) times daily.  . Multiple Vitamins-Minerals (VISION FORMULA EYE HEALTH PO) Take 1 tablet by mouth 2 (two) times daily.   . pantoprazole (PROTONIX) 40 MG tablet Take 1 tablet (40 mg total) by mouth daily before breakfast.  . pramoxine-hydrocortisone (PROCTOCREAM-HC) 1-1 % rectal cream Place 1 application rectally 2 (two) times daily as needed for hemorrhoids or anal itching.   No current facility-administered medications for this visit. (Other)      REVIEW OF SYSTEMS:    ALLERGIES Allergies  Allergen Reactions  . Atorvastatin Other (See Comments)    Myalgias  . Hydrocodone Other (See Comments)    GI distress.    PAST MEDICAL HISTORY Past Medical History:  Diagnosis Date  . Anxiety   . Arteriosclerotic cardiovascular disease (ASCVD)     cath Feb '07- no obstructive dz - 20% proximal LAD only; EF 65%; possible coronary artery spasm  . Atrial fibrillation (HCC)    Paroxysmal on event recorder in 2009; regular supraventricular tachycardia at a rate of 150 also identified on that study representing either atrial flutter or PSVT; onset of persistent AF in 03/2011  . Carcinoma of breast ([place]) 2001   Left mastectomy with positive nodes  . Chest pain    Long-standing and  atypical  . Chronic anticoagulation 04/12/2011  . GERD (gastroesophageal reflux disease)    hiatal hernia  . Herpes zoster   . Hyperlipidemia    Lipid profile in 11/2008:282, 176, 64, 201  . Hypertension    diastolic dysfunction; normal CMet and TSH in 11/2009; normal CBC in 04/2010  . Malignant neoplasm of breast (female), unspecified site 05/27/2013   right breast lumpectomy with positive node  . Peripheral neuropathy   . Polio 1946   at age 34  . Rectal bleed   . Renal insufficiency   . Shortness of breath dyspnea   . Vitreous hemorrhage of right eye ([place]) 10/23/2019   Past Surgical History:  Procedure Laterality Date  . BREAST LUMPECTOMY WITH NEEDLE LOCALIZATION AND  AXILLARY SENTINEL LYMPH NODE BX Right 05/27/2013   Procedure: BREAST LUMPECTOMY WITH NEEDLE LOCALIZATION AND AXILLARY SENTINEL LYMPH NODE BX;  Surgeon: Pedro Earls, MD;  Location: [facility];  Service: General;  Laterality: Right;  . BREAST SURGERY    . CHOLECYSTECTOMY  1999  . COLONOSCOPY  02/22/2011   Procedure: COLONOSCOPY;  Surgeon: Rogene Houston, MD;  [facility];  Service: Endoscopy;  Laterality: N/A;; performed for scant hematochezia  . DILATION AND CURETTAGE OF UTERUS    . ESOPHAGOGASTRODUODENOSCOPY  02/22/2011   Procedure: ESOPHAGOGASTRODUODENOSCOPY (EGD);  Surgeon: Rogene Houston, MD;  [facility];  Service: Endoscopy;  Laterality: N/A;  . ESOPHAGOGASTRODUODENOSCOPY N/A 06/02/2014   Procedure: ESOPHAGOGASTRODUODENOSCOPY (EGD);  Surgeon: Rogene Houston, MD;  [facility];  Service: Endoscopy;  Laterality: N/A;  730  . EYE SURGERY     both cataracts  . MALONEY DILATION N/A 06/02/2014   Procedure: Venia Minks DILATION;  Surgeon: Rogene Houston, MD;  [facility];  Service: Endoscopy;  Laterality: N/A;  . MASTECTOMY  2001   Left;modified radical with lymph node dissection  . PORT-A-CATH REMOVAL     pac in and out 2002    FAMILY HISTORY Family History  Problem Relation Age of Onset  . Cancer Sister        lung  . Cancer Brother        lipoma  . Cancer Brother        kidney    SOCIAL HISTORY Social History   Tobacco Use  . Smoking status: Never Smoker  . Smokeless tobacco: Never Used  Vaping Use  . Vaping Use: Never used  Substance Use Topics  . Alcohol use: No    Alcohol/week: 0.0 standard drinks  . Drug use: No         OPHTHALMIC EXAM: Base Eye Exam    Visual Acuity (ETDRS)      Right Left   Dist [place] 20/70ecc 20/60   Dist ph [place] NI 20/50       Tonometry (Tonopen, 8:30 AM)      Right Left   Pressure 18 18       Pupils      Pupils Dark Light Shape React APD   Right PERRL 5 4 Round Slow  None   Left PERRL 5 4 Round Slow None       Visual Fields (Counting fingers)      Left Right    Full Full       Extraocular Movement      Right Left    Full Full       Neuro/Psych    Oriented x3: Yes   Mood/Affect: Normal  Dilation    Left eye: 1.0% Mydriacyl, 2.5% Phenylephrine @ 8:30 AM        Slit Lamp and Fundus Exam    External Exam      Right Left   External Normal Normal       Slit Lamp Exam      Right Left   Lids/Lashes Normal Normal   Conjunctiva/Sclera White and quiet White and quiet   Cornea Clear Clear   Anterior Chamber Deep and quiet Deep and quiet   Iris Round and reactive Round and reactive   Lens Posterior chamber intraocular lens Posterior chamber intraocular lens   Anterior Vitreous Normal Normal       Fundus Exam      Right Left   Posterior Vitreous clear , vitrectomized Posterior vitreous detachment   Disc  Normal   C/D Ratio  0.3   Macula  Disciform scar, [place], no exudates, no hemorrhage, no macular thickening, Mottling, Retinal pigment epithelial mottling, Atrophy, Retinal pigment epithelial atrophy, Age related macular degeneration, Early age related macular degeneration, Geographic atrophy near the FAZ, Pigmented atrophy, Hard drusen,   Vessels  Normal   Periphery  Normal, no holes          IMAGING AND PROCEDURES  Imaging and Procedures for 11/30/20  OCT, Retina - OU - Both Eyes       Right Eye Quality was borderline. Scan locations included subfoveal. Central Foveal Thickness: 320. Progression has been stable. Findings include subretinal hyper-reflective material, disciform scar.   Left Eye Quality was good. Scan locations included subfoveal. Central Foveal Thickness: 243. Progression has improved. Findings include abnormal foveal contour, intraretinal fluid.   Notes Disciform macular scar subfoveal OD, yet with less activity currently at 2-week post recent injection, wandering baseline and computer-generated results  and erroneous thickness measure overall less intraretinal and subretinal fluid today  OS, 6.5 week post injection still improving, yet with intraretinal fluid Temporal to the macula, repeat injection today to maintain       Intravitreal Injection, Pharmacologic Agent - OS - Left Eye       Time Out 11/30/2020. 9:04 AM. Confirmed correct patient, procedure, site, and patient consented.   Anesthesia Topical anesthesia was used. Anesthetic medications included [place] 3.5%.   Procedure Preparation included Ofloxacin , Tobramycin 0.3%, 10% betadine to eyelids, 5% betadine to ocular surface. A supplied needle was used.   Injection:  2.5 mg Bevacizumab (AVASTIN) 2.5mg /0.72mL SOSY   NDC: 32122-482-50, Lot: [id number]   Route: Intravitreal, Site: Left Eye  Post-op Post injection exam found visual acuity of at least counting fingers. The patient tolerated the procedure well. There were no complications. The patient received written and verbal post procedure care education. Post injection medications were not given.                 ASSESSMENT/PLAN:  Exudative age-related macular degeneration of left eye with active choroidal neovascularization (HCC) Today at 6.5-week postinjection, with persistent intraretinal fluid temporally.  We will repeat intravitreal Avastin OS today and examination next in 6 weeks  Advanced nonexudative age-related macular degeneration of right eye with subfoveal involvement OD stable subfoveal CNVM, stable acuity 2 weeks post recent injection follow-up as scheduled  Posterior vitreous detachment of left eye  The nature of posterior vitreous detachment was discussed with the patient as well as its physiology, its age prevalence, and its possible implication regarding retinal breaks and detachment.  An informational brochure was offered to the patient.  All the patient's questions  were answered.  The patient was asked to return if new or different flashes or floaters  develops.   Patient was instructed to contact office immediately if any new changes were noticed. I explained to the patient that vitreous inside the eye is similar to jello inside a bowl. As the jello melts it can start to pull away from the bowl, similarly the vitreous throughout our lives can begin to pull away from the retina. That process is called a posterior vitreous detachment. In some cases, the vitreous can tug hard enough on the retina to form a retinal tear. I discussed with the patient the signs and symptoms of a retinal detachment.  Do not rub the eye.        ICD-10-CM   1. Exudative age-related macular degeneration of left eye with active choroidal neovascularization (HCC)  H35.3221 OCT, Retina - OU - Both Eyes    Intravitreal Injection, Pharmacologic Agent - OS - Left Eye    bevacizumab (AVASTIN) SOSY 2.5 mg  2. Advanced nonexudative age-related macular degeneration of right eye with subfoveal involvement  H35.3114   3. Posterior vitreous detachment of left eye  H43.812     1.  OS with stable acuity and overall stable macular findings but still persistent intraretinal fluid temporal to the fovea threatening active CNVM formation currently at 6.5-week interval.  Repeat injection today and maintain 5 to 6-week interval follow-up left eye 2.  OD now 2 weeks post recent injection, follow-up as scheduled  3.  Ophthalmic Meds Ordered this visit:  Meds ordered this encounter  Medications  . bevacizumab (AVASTIN) SOSY 2.5 mg       Return in about 6 weeks (around 01/11/2021) for dilate, OS, AVASTIN OCT.  There are no Patient Instructions on file for this visit.   Explained the diagnoses, plan, and follow up with the patient and they expressed understanding.  Patient expressed understanding of the importance of proper follow up care.   Clent Demark Lizmary Nader M.D. Diseases & Surgery of the Retina and Vitreous [facility] 11/30/20     Abbreviations: M myopia  (nearsighted); A astigmatism; H hyperopia (farsighted); P presbyopia; Mrx spectacle prescription;  CTL contact lenses; OD right eye; OS left eye; OU both eyes  XT exotropia; ET esotropia; PEK punctate epithelial keratitis; PEE punctate epithelial erosions; DES dry eye syndrome; MGD meibomian gland dysfunction; ATs artificial tears; PFAT's preservative free artificial tears; [place] nuclear sclerotic cataract; PSC posterior subcapsular cataract; ERM epi-retinal membrane; PVD posterior vitreous detachment; RD retinal detachment; DM diabetes mellitus; DR diabetic retinopathy; NPDR non-proliferative diabetic retinopathy; PDR proliferative diabetic retinopathy; CSME clinically significant macular edema; DME diabetic macular edema; dbh dot blot hemorrhages; CWS cotton wool spot; POAG primary open angle glaucoma; C/D cup-to-disc ratio; HVF humphrey visual field; GVF goldmann visual field; OCT optical coherence tomography; IOP intraocular pressure; BRVO Branch retinal vein occlusion; CRVO central retinal vein occlusion; CRAO central retinal artery occlusion; BRAO branch retinal artery occlusion; RT retinal tear; SB scleral buckle; PPV pars plana vitrectomy; VH Vitreous hemorrhage; PRP panretinal laser photocoagulation; IVK intravitreal kenalog; VMT vitreomacular traction; MH Macular hole;  NVD neovascularization of the disc; NVE neovascularization elsewhere; AREDS age related eye disease study; ARMD age related macular degeneration; POAG primary open angle glaucoma; EBMD epithelial/anterior basement membrane dystrophy; ACIOL anterior chamber intraocular lens; IOL intraocular lens; PCIOL posterior chamber intraocular lens; Phaco/IOL phacoemulsification with intraocular lens placement; [place] photorefractive keratectomy; LASIK laser assisted in situ keratomileusis; HTN hypertension; DM diabetes mellitus; COPD chronic obstructive  pulmonary disease

## 2020-11-30 NOTE — Assessment & Plan Note (Signed)
OD stable subfoveal CNVM, stable acuity 2 weeks post recent injection follow-up as scheduled

## 2020-12-02 ENCOUNTER — Encounter (INDEPENDENT_AMBULATORY_CARE_PROVIDER_SITE_OTHER): Payer: PPO | Admitting: Ophthalmology

## 2020-12-27 DIAGNOSIS — G620 Drug-induced polyneuropathy: Secondary | ICD-10-CM | POA: Diagnosis not present

## 2020-12-27 DIAGNOSIS — J449 Chronic obstructive pulmonary disease, unspecified: Secondary | ICD-10-CM | POA: Diagnosis not present

## 2020-12-27 DIAGNOSIS — I5020 Unspecified systolic (congestive) heart failure: Secondary | ICD-10-CM | POA: Diagnosis not present

## 2020-12-27 DIAGNOSIS — D692 Other nonthrombocytopenic purpura: Secondary | ICD-10-CM | POA: Diagnosis not present

## 2020-12-27 DIAGNOSIS — Z7901 Long term (current) use of anticoagulants: Secondary | ICD-10-CM | POA: Diagnosis not present

## 2020-12-27 DIAGNOSIS — D6869 Other thrombophilia: Secondary | ICD-10-CM | POA: Diagnosis not present

## 2020-12-27 DIAGNOSIS — I480 Paroxysmal atrial fibrillation: Secondary | ICD-10-CM | POA: Diagnosis not present

## 2020-12-27 DIAGNOSIS — N183 Chronic kidney disease, stage 3 unspecified: Secondary | ICD-10-CM | POA: Diagnosis not present

## 2020-12-27 DIAGNOSIS — E261 Secondary hyperaldosteronism: Secondary | ICD-10-CM | POA: Diagnosis not present

## 2020-12-27 DIAGNOSIS — Z853 Personal history of malignant neoplasm of breast: Secondary | ICD-10-CM | POA: Diagnosis not present

## 2020-12-27 DIAGNOSIS — I739 Peripheral vascular disease, unspecified: Secondary | ICD-10-CM | POA: Diagnosis not present

## 2020-12-27 DIAGNOSIS — T451X5S Adverse effect of antineoplastic and immunosuppressive drugs, sequela: Secondary | ICD-10-CM | POA: Diagnosis not present

## 2020-12-30 ENCOUNTER — Encounter (INDEPENDENT_AMBULATORY_CARE_PROVIDER_SITE_OTHER): Payer: PPO | Admitting: Ophthalmology

## 2020-12-30 ENCOUNTER — Ambulatory Visit (INDEPENDENT_AMBULATORY_CARE_PROVIDER_SITE_OTHER): Payer: PPO | Admitting: Ophthalmology

## 2020-12-30 ENCOUNTER — Other Ambulatory Visit: Payer: Self-pay

## 2020-12-30 ENCOUNTER — Encounter (INDEPENDENT_AMBULATORY_CARE_PROVIDER_SITE_OTHER): Payer: Self-pay | Admitting: Ophthalmology

## 2020-12-30 DIAGNOSIS — H353221 Exudative age-related macular degeneration, left eye, with active choroidal neovascularization: Secondary | ICD-10-CM | POA: Diagnosis not present

## 2020-12-30 DIAGNOSIS — H353114 Nonexudative age-related macular degeneration, right eye, advanced atrophic with subfoveal involvement: Secondary | ICD-10-CM

## 2020-12-30 DIAGNOSIS — H353211 Exudative age-related macular degeneration, right eye, with active choroidal neovascularization: Secondary | ICD-10-CM | POA: Diagnosis not present

## 2020-12-30 MED ORDER — BEVACIZUMAB 2.5 MG/0.1ML IZ SOSY
2.5000 mg | PREFILLED_SYRINGE | INTRAVITREAL | Status: AC | PRN
  Administered 2020-12-30: 2.5 mg via INTRAVITREAL

## 2020-12-30 NOTE — Progress Notes (Signed)
12/30/2020     CHIEF COMPLAINT Patient presents for Retina Follow Up (6 week fu OD and Avastin OD/Pt states VA OU stable since last visit. Pt denies FOL, floaters, or ocular pain OU. /Pt reports using Cosopt BID OD and Latanoprost QHS OD/)   HISTORY OF PRESENT ILLNESS: [name] is a 87 y.o. female who presents to the clinic today for:   HPI     Retina Follow Up           Diagnosis: Wet AMD   Laterality: right eye   Onset: 6 weeks ago   Severity: mild   Duration: 6 weeks   Course: stable   Comments: 6 week fu OD and Avastin OD Pt states VA OU stable since last visit. Pt denies FOL, floaters, or ocular pain OU.  Pt reports using Cosopt BID OD and Latanoprost QHS OD        Last edited by Kendra Opitz, COA on 12/30/2020  8:36 AM.      Referring physician: Asencion Noble, MD [address] [place],  [place] [address]  HISTORICAL INFORMATION:   Selected notes from the MEDICAL RECORD NUMBER       CURRENT MEDICATIONS: Current Outpatient Medications (Ophthalmic Drugs)  Medication Sig   dorzolamide-timolol (COSOPT) 22.3-6.8 MG/ML ophthalmic solution Place 1 drop into the right eye 2 (two) times daily.    Latanoprostene Bunod 0.024 % SOLN Place 1 drop into the right eye at bedtime.    No current facility-administered medications for this visit. (Ophthalmic Drugs)   Current Outpatient Medications (Other)  Medication Sig   acetaminophen (TYLENOL) 500 MG tablet Take 500 mg by mouth every 6 (six) hours as needed. Patient reports that she may take 1 or 2 times a day for hip pain.   albuterol (VENTOLIN HFA) 108 (90 Base) MCG/ACT inhaler Inhale 2 puffs into the lungs every 4 (four) hours as needed.   carvedilol (COREG) 12.5 MG tablet Take 12.5 mg by mouth 2 (two) times daily with a meal.    ELIQUIS 5 MG TABS tablet TAKE (1) TABLET BY MOUTH TWICE DAILY FOR BLOOD THINNER.   furosemide (LASIX) 40 MG tablet TAKE ONE TABLET BY MOUTH ONCE DAILY.   losartan (COZAAR) 25 MG  tablet Take 1 tablet (25 mg total) by mouth 2 (two) times daily.   Multiple Vitamins-Minerals (VISION FORMULA EYE HEALTH PO) Take 1 tablet by mouth 2 (two) times daily.    pantoprazole (PROTONIX) 40 MG tablet Take 1 tablet (40 mg total) by mouth daily before breakfast.   pramoxine-hydrocortisone (PROCTOCREAM-HC) 1-1 % rectal cream Place 1 application rectally 2 (two) times daily as needed for hemorrhoids or anal itching.   No current facility-administered medications for this visit. (Other)      REVIEW OF SYSTEMS:    ALLERGIES Allergies  Allergen Reactions   Atorvastatin Other (See Comments)    Myalgias   Hydrocodone Other (See Comments)    GI distress.    PAST MEDICAL HISTORY Past Medical History:  Diagnosis Date   Anxiety    Arteriosclerotic cardiovascular disease (ASCVD)     cath Feb '07- no obstructive dz - 20% proximal LAD only; EF 65%; possible coronary artery spasm   Atrial fibrillation (HCC)    Paroxysmal on event recorder in 2009; regular supraventricular tachycardia at a rate of 150 also identified on that study representing either atrial flutter or PSVT; onset of persistent AF in 03/2011   Carcinoma of breast ([place]) 2001   Left mastectomy  with positive nodes   Chest pain    Long-standing and atypical   Chronic anticoagulation 04/12/2011   GERD (gastroesophageal reflux disease)    hiatal hernia   Herpes zoster    Hyperlipidemia    Lipid profile in 11/2008:282, 176, 64, 201   Hypertension    diastolic dysfunction; normal CMet and TSH in 11/2009; normal CBC in 04/2010   Malignant neoplasm of breast (female), unspecified site 05/27/2013   right breast lumpectomy with positive node   Peripheral neuropathy    [place] 1946   at age 31   Rectal bleed    Renal insufficiency    Shortness of breath dyspnea    Vitreous hemorrhage of right eye ([place]) 10/23/2019   Past Surgical History:  Procedure Laterality Date   BREAST LUMPECTOMY WITH NEEDLE LOCALIZATION AND AXILLARY  SENTINEL LYMPH NODE BX Right 05/27/2013   Procedure: BREAST LUMPECTOMY WITH NEEDLE LOCALIZATION AND AXILLARY SENTINEL LYMPH NODE BX;  Surgeon: Pedro Earls, MD;  Location: [facility];  Service: General;  Laterality: Right;   [place]   COLONOSCOPY  02/22/2011   Procedure: COLONOSCOPY;  Surgeon: Rogene Houston, MD;  [facility];  Service: Endoscopy;  Laterality: N/A;; performed for scant hematochezia   DILATION AND CURETTAGE OF UTERUS     ESOPHAGOGASTRODUODENOSCOPY  02/22/2011   Procedure: ESOPHAGOGASTRODUODENOSCOPY (EGD);  Surgeon: Rogene Houston, MD;  [facility];  Service: Endoscopy;  Laterality: N/A;   ESOPHAGOGASTRODUODENOSCOPY N/A 06/02/2014   Procedure: ESOPHAGOGASTRODUODENOSCOPY (EGD);  Surgeon: Rogene Houston, MD;  [facility];  Service: Endoscopy;  Laterality: N/A;  730   EYE SURGERY     both cataracts   MALONEY DILATION N/A 06/02/2014   Procedure: MALONEY DILATION;  Surgeon: Rogene Houston, MD;  [facility];  Service: Endoscopy;  Laterality: N/A;   MASTECTOMY  2001   Left;modified radical with lymph node dissection   PORT-A-CATH REMOVAL     pac in and out 2002    FAMILY HISTORY Family History  Problem Relation Age of Onset   Cancer Sister        lung   Cancer Brother        lipoma   Cancer Brother        kidney    SOCIAL HISTORY Social History   Tobacco Use   Smoking status: Never   Smokeless tobacco: Never  Vaping Use   Vaping Use: Never used  Substance Use Topics   Alcohol use: No    Alcohol/week: 0.0 standard drinks   Drug use: No         OPHTHALMIC EXAM:  Base Eye Exam     Visual Acuity (ETDRS)       Right Left   Dist [place] 20/100 -1 20/60 -1   Dist ph [place] 20/80ecc NI         Tonometry (Tonopen, 8:40 AM)       Right Left   Pressure 25 23         Tonometry #2 (Tonopen, 8:40 AM)       Right Left   Pressure 25 24         Pupils        Pupils Dark Light Shape React APD   Right PERRL 5 4 Round Slow None   Left PERRL 5 4 Round Slow None         Visual Fields (Counting fingers)  Left Right    Full Full         Extraocular Movement       Right Left    Full Full         Neuro/Psych     Oriented x3: Yes   Mood/Affect: Normal         Dilation     Right eye: 1.0% Mydriacyl, 2.5% Phenylephrine @ 8:40 AM           Slit Lamp and Fundus Exam     External Exam       Right Left   External Normal Normal         Slit Lamp Exam       Right Left   Lids/Lashes Normal Normal   Conjunctiva/Sclera White and quiet White and quiet   Cornea Clear Clear   Anterior Chamber Deep and quiet Deep and quiet   Iris Round and reactive Round and reactive   Lens Posterior chamber intraocular lens Posterior chamber intraocular lens   Anterior Vitreous Normal Normal         Fundus Exam       Right Left   Posterior Vitreous Vitrectomized    Disc Normal    C/D Ratio 0.45    Macula Retinal pigment epithelial mottling, Retinal pigment epithelial detachment, Macular thickening, Retinal pigment epithelial atrophy signifying old RPE rip temporal to fovea subfoveal., Intraretinal hemorrhage, persistent subretinal hemorrhage, Geographic atrophy    Vessels Normal    Periphery Normal             IMAGING AND PROCEDURES  Imaging and Procedures for 12/30/20  OCT, Retina - OU - Both Eyes       Right Eye Quality was borderline. Scan locations included subfoveal. Central Foveal Thickness: 474. Progression has been stable. Findings include subretinal hyper-reflective material, disciform scar, outer retinal atrophy, inner retinal atrophy.   Left Eye Quality was good. Scan locations included subfoveal. Central Foveal Thickness: 249. Progression has improved. Findings include abnormal foveal contour, intraretinal fluid, outer retinal atrophy.   Notes Disciform macular scar subfoveal OD, yet with less  activity currently at 6-week post recent injection, wandering baseline and computer-generated results and erroneous thickness measure overall less intraretinal and subretinal fluid today, repeat injection today OD  OS, 4 week post injection still improving, yet with intraretinal fluid Temporal to the macula,      Intravitreal Injection, Pharmacologic Agent - OD - Right Eye       Time Out 12/30/2020. 8:55 AM. Confirmed correct patient, procedure, site, and patient consented.   Anesthesia Topical anesthesia was used. Anesthetic medications included [place] 3.5%.   Procedure Preparation included Tobramycin 0.3%, 10% betadine to eyelids, Ofloxacin , 5% betadine to ocular surface. A 30 gauge needle was used.   Injection: 2.5 mg bevacizumab 2.5 MG/0.1ML   Route: Intravitreal   NDC: 16606-301-60, Lot: [id number]   Post-op Post injection exam found visual acuity of at least counting fingers. The patient tolerated the procedure well. There were no complications. The patient received written and verbal post procedure care education. Post injection medications were not given.              ASSESSMENT/PLAN:  Exudative age-related macular degeneration of left eye with active choroidal neovascularization (HCC) Today at 4-week follow-up less active disease  Exudative age-related macular degeneration of right eye with active choroidal neovascularization (HCC) Vascularized pigment epithelial detachment subfoveal with adjacent subretinal fluid overall stabilized if not improved yet still with intraretinal fluid inferiorly, will  need to repeat injection today at 6-week interval and examination next in 6 weeks OD     ICD-10-CM   1. Exudative age-related macular degeneration of right eye with active choroidal neovascularization (HCC)  H35.3211 OCT, Retina - OU - Both Eyes    Intravitreal Injection, Pharmacologic Agent - OD - Right Eye    bevacizumab (AVASTIN) SOSY 2.5 mg    2. Advanced nonexudative  age-related macular degeneration of right eye with subfoveal involvement  H35.3114 Intravitreal Injection, Pharmacologic Agent - OD - Right Eye    bevacizumab (AVASTIN) SOSY 2.5 mg    3. Exudative age-related macular degeneration of left eye with active choroidal neovascularization ([place])  H35.3221       1.  OD, with subfoveal vascularized pigment epithelial detachment with persistent subretinal fluid overall improved at 6 weeks yet still active disease.  Repeat injection today to maintain acuity and prevent scotoma enlargement  2.  OS, today at 4-week follow-up by OCT, improved and stable follow-up as scheduled  3.  Ophthalmic Meds Ordered this visit:  Meds ordered this encounter  Medications   bevacizumab (AVASTIN) SOSY 2.5 mg       Return in about 6 weeks (around 02/10/2021) for OD, dilate, AVASTIN OCT.  There are no Patient Instructions on file for this visit.   Explained the diagnoses, plan, and follow up with the patient and they expressed understanding.  Patient expressed understanding of the importance of proper follow up care.   Clent Demark Eliceo Gladu M.D. Diseases & Surgery of the Retina and Vitreous [facility] [place] 12/30/20     Abbreviations: M myopia (nearsighted); A astigmatism; H hyperopia (farsighted); P presbyopia; Mrx spectacle prescription;  CTL contact lenses; OD right eye; OS left eye; OU both eyes  XT exotropia; ET esotropia; PEK punctate epithelial keratitis; PEE punctate epithelial erosions; DES dry eye syndrome; MGD meibomian gland dysfunction; ATs artificial tears; PFAT's preservative free artificial tears; [place] nuclear sclerotic cataract; PSC posterior subcapsular cataract; ERM epi-retinal membrane; PVD posterior vitreous detachment; RD retinal detachment; DM diabetes mellitus; DR diabetic retinopathy; NPDR non-proliferative diabetic retinopathy; PDR proliferative diabetic retinopathy; CSME clinically significant macular edema; DME diabetic macular  edema; dbh dot blot hemorrhages; CWS cotton wool spot; POAG primary open angle glaucoma; C/D cup-to-disc ratio; HVF humphrey visual field; GVF goldmann visual field; OCT optical coherence tomography; IOP intraocular pressure; BRVO Branch retinal vein occlusion; CRVO central retinal vein occlusion; CRAO central retinal artery occlusion; BRAO branch retinal artery occlusion; RT retinal tear; SB scleral buckle; PPV pars plana vitrectomy; VH Vitreous hemorrhage; PRP panretinal laser photocoagulation; IVK intravitreal kenalog; VMT vitreomacular traction; MH Macular hole;  NVD neovascularization of the disc; NVE neovascularization elsewhere; AREDS age related eye disease study; ARMD age related macular degeneration; POAG primary open angle glaucoma; EBMD epithelial/anterior basement membrane dystrophy; ACIOL anterior chamber intraocular lens; IOL intraocular lens; PCIOL posterior chamber intraocular lens; Phaco/IOL phacoemulsification with intraocular lens placement; [place] photorefractive keratectomy; LASIK laser assisted in situ keratomileusis; HTN hypertension; DM diabetes mellitus; COPD chronic obstructive pulmonary disease

## 2020-12-30 NOTE — Assessment & Plan Note (Signed)
Vascularized pigment epithelial detachment subfoveal with adjacent subretinal fluid overall stabilized if not improved yet still with intraretinal fluid inferiorly, will need to repeat injection today at 6-week interval and examination next in 6 weeks OD

## 2020-12-30 NOTE — Assessment & Plan Note (Signed)
Today at 4-week follow-up less active disease

## 2021-01-11 ENCOUNTER — Encounter (INDEPENDENT_AMBULATORY_CARE_PROVIDER_SITE_OTHER): Payer: Self-pay | Admitting: Ophthalmology

## 2021-01-11 ENCOUNTER — Encounter (INDEPENDENT_AMBULATORY_CARE_PROVIDER_SITE_OTHER): Payer: PPO | Admitting: Ophthalmology

## 2021-01-11 ENCOUNTER — Ambulatory Visit (INDEPENDENT_AMBULATORY_CARE_PROVIDER_SITE_OTHER): Payer: PPO | Admitting: Ophthalmology

## 2021-01-11 ENCOUNTER — Other Ambulatory Visit: Payer: Self-pay

## 2021-01-11 DIAGNOSIS — H353221 Exudative age-related macular degeneration, left eye, with active choroidal neovascularization: Secondary | ICD-10-CM

## 2021-01-11 DIAGNOSIS — H353211 Exudative age-related macular degeneration, right eye, with active choroidal neovascularization: Secondary | ICD-10-CM | POA: Diagnosis not present

## 2021-01-11 MED ORDER — BEVACIZUMAB 2.5 MG/0.1ML IZ SOSY
2.5000 mg | PREFILLED_SYRINGE | INTRAVITREAL | Status: AC | PRN
  Administered 2021-01-11: 2.5 mg via INTRAVITREAL

## 2021-01-11 NOTE — Assessment & Plan Note (Signed)
Chronic active with intraretinal fluid and CME temporal to fovea still active at 6-week interval.  We will repeat injection OD OS today and examination OS again in 6 weeks

## 2021-01-11 NOTE — Assessment & Plan Note (Signed)
OD less active today at 1 week nearly 2 weeks postinjection follow-up as scheduled

## 2021-01-11 NOTE — Progress Notes (Signed)
01/11/2021     CHIEF COMPLAINT Patient presents for Retina Follow Up (6 week fu OS and Avastin OS/Pt states VA OU stable since last visit. Pt denies FOL, floaters, or ocular pain OU. /Pt reports using Cosopt BID OD and Latanoprost QHS OD/)   HISTORY OF PRESENT ILLNESS: [name] is a 87 y.o. female who presents to the clinic today for:   HPI     Retina Follow Up           Diagnosis: Wet AMD   Laterality: left eye   Onset: 6 weeks ago   Severity: mild   Duration: 6 weeks   Course: stable   Comments: 6 week fu OS and Avastin OS Pt states VA OU stable since last visit. Pt denies FOL, floaters, or ocular pain OU.  Pt reports using Cosopt BID OD and Latanoprost QHS OD        Last edited by Kendra Opitz, COA on 01/11/2021  2:24 PM.      Referring physician: Asencion Noble, MD [address] [place],  [place] [address]  HISTORICAL INFORMATION:   Selected notes from the MEDICAL RECORD NUMBER       CURRENT MEDICATIONS: Current Outpatient Medications (Ophthalmic Drugs)  Medication Sig   dorzolamide-timolol (COSOPT) 22.3-6.8 MG/ML ophthalmic solution Place 1 drop into the right eye 2 (two) times daily.    Latanoprostene Bunod 0.024 % SOLN Place 1 drop into the right eye at bedtime.    No current facility-administered medications for this visit. (Ophthalmic Drugs)   Current Outpatient Medications (Other)  Medication Sig   acetaminophen (TYLENOL) 500 MG tablet Take 500 mg by mouth every 6 (six) hours as needed. Patient reports that she may take 1 or 2 times a day for hip pain.   albuterol (VENTOLIN HFA) 108 (90 Base) MCG/ACT inhaler Inhale 2 puffs into the lungs every 4 (four) hours as needed.   carvedilol (COREG) 12.5 MG tablet Take 12.5 mg by mouth 2 (two) times daily with a meal.    ELIQUIS 5 MG TABS tablet TAKE (1) TABLET BY MOUTH TWICE DAILY FOR BLOOD THINNER.   furosemide (LASIX) 40 MG tablet TAKE ONE TABLET BY MOUTH ONCE DAILY.   losartan (COZAAR) 25 MG  tablet Take 1 tablet (25 mg total) by mouth 2 (two) times daily.   Multiple Vitamins-Minerals (VISION FORMULA EYE HEALTH PO) Take 1 tablet by mouth 2 (two) times daily.    pantoprazole (PROTONIX) 40 MG tablet Take 1 tablet (40 mg total) by mouth daily before breakfast.   pramoxine-hydrocortisone (PROCTOCREAM-HC) 1-1 % rectal cream Place 1 application rectally 2 (two) times daily as needed for hemorrhoids or anal itching.   No current facility-administered medications for this visit. (Other)      REVIEW OF SYSTEMS:    ALLERGIES Allergies  Allergen Reactions   Atorvastatin Other (See Comments)    Myalgias   Hydrocodone Other (See Comments)    GI distress.    PAST MEDICAL HISTORY Past Medical History:  Diagnosis Date   Anxiety    Arteriosclerotic cardiovascular disease (ASCVD)     cath Feb '07- no obstructive dz - 20% proximal LAD only; EF 65%; possible coronary artery spasm   Atrial fibrillation (HCC)    Paroxysmal on event recorder in 2009; regular supraventricular tachycardia at a rate of 150 also identified on that study representing either atrial flutter or PSVT; onset of persistent AF in 03/2011   Carcinoma of breast ([place]) 2001   Left mastectomy  with positive nodes   Chest pain    Long-standing and atypical   Chronic anticoagulation 04/12/2011   GERD (gastroesophageal reflux disease)    hiatal hernia   Herpes zoster    Hyperlipidemia    Lipid profile in 11/2008:282, 176, 64, 201   Hypertension    diastolic dysfunction; normal CMet and TSH in 11/2009; normal CBC in 04/2010   Malignant neoplasm of breast (female), unspecified site 05/27/2013   right breast lumpectomy with positive node   Peripheral neuropathy    [place] 1946   at age 40   Rectal bleed    Renal insufficiency    Shortness of breath dyspnea    Vitreous hemorrhage of right eye ([place]) 10/23/2019   Past Surgical History:  Procedure Laterality Date   BREAST LUMPECTOMY WITH NEEDLE LOCALIZATION AND AXILLARY  SENTINEL LYMPH NODE BX Right 05/27/2013   Procedure: BREAST LUMPECTOMY WITH NEEDLE LOCALIZATION AND AXILLARY SENTINEL LYMPH NODE BX;  Surgeon: Pedro Earls, MD;  Location: [facility];  Service: General;  Laterality: Right;   [place]   COLONOSCOPY  02/22/2011   Procedure: COLONOSCOPY;  Surgeon: Rogene Houston, MD;  [facility];  Service: Endoscopy;  Laterality: N/A;; performed for scant hematochezia   DILATION AND CURETTAGE OF UTERUS     ESOPHAGOGASTRODUODENOSCOPY  02/22/2011   Procedure: ESOPHAGOGASTRODUODENOSCOPY (EGD);  Surgeon: Rogene Houston, MD;  [facility];  Service: Endoscopy;  Laterality: N/A;   ESOPHAGOGASTRODUODENOSCOPY N/A 06/02/2014   Procedure: ESOPHAGOGASTRODUODENOSCOPY (EGD);  Surgeon: Rogene Houston, MD;  [facility];  Service: Endoscopy;  Laterality: N/A;  730   EYE SURGERY     both cataracts   MALONEY DILATION N/A 06/02/2014   Procedure: MALONEY DILATION;  Surgeon: Rogene Houston, MD;  [facility];  Service: Endoscopy;  Laterality: N/A;   MASTECTOMY  2001   Left;modified radical with lymph node dissection   PORT-A-CATH REMOVAL     pac in and out 2002    FAMILY HISTORY Family History  Problem Relation Age of Onset   Cancer Sister        lung   Cancer Brother        lipoma   Cancer Brother        kidney    SOCIAL HISTORY Social History   Tobacco Use   Smoking status: Never   Smokeless tobacco: Never  Vaping Use   Vaping Use: Never used  Substance Use Topics   Alcohol use: No    Alcohol/week: 0.0 standard drinks   Drug use: No         OPHTHALMIC EXAM:  Base Eye Exam     Visual Acuity (ETDRS)       Right Left   Dist [place] 20/400 20/70   Dist ph [place] 20/80 -2 NI         Tonometry (Tonopen, 2:26 PM)       Right Left   Pressure 20 15         Pupils       Pupils Dark Light Shape React APD   Right PERRL 5 4 Round Slow None   Left PERRL 5  4 Round Slow None         Visual Fields (Counting fingers)       Left Right    Full Full         Extraocular Movement       Right  Left    Full Full         Neuro/Psych     Oriented x3: Yes   Mood/Affect: Normal         Dilation     Left eye: 1.0% Mydriacyl, 2.5% Phenylephrine @ 2:26 PM           Slit Lamp and Fundus Exam     External Exam       Right Left   External Normal Normal         Slit Lamp Exam       Right Left   Lids/Lashes Normal Normal   Conjunctiva/Sclera White and quiet White and quiet   Cornea Clear Clear   Anterior Chamber Deep and quiet Deep and quiet   Iris Round and reactive Round and reactive   Lens Posterior chamber intraocular lens Posterior chamber intraocular lens   Anterior Vitreous Normal Normal         Fundus Exam       Right Left   Posterior Vitreous clear , vitrectomized Posterior vitreous detachment   Disc  Normal   C/D Ratio  0.3   Macula  Disciform scar, Drus[place], no exudates, no hemorrhage, no macular thickening, Mottling, Retinal pigment epithelial mottling, Atrophy, Retinal pigment epithelial atrophy, Age related macular degeneration, Early age related macular degeneration, Geographic atrophy near the FAZ, Pigmented atrophy, Hard drusen,   Vessels  Normal   Periphery  Normal, no holes            IMAGING AND PROCEDURES  Imaging and Procedures for 01/11/21  OCT, Retina - OU - Both Eyes       Right Eye Quality was borderline. Scan locations included subfoveal. Central Foveal Thickness: 474. Progression has been stable. Findings include subretinal hyper-reflective material, disciform scar, outer retinal atrophy, inner retinal atrophy.   Left Eye Quality was good. Scan locations included subfoveal. Central Foveal Thickness: 249. Progression has been stable. Findings include abnormal foveal contour, intraretinal fluid, outer retinal atrophy.   Notes Disciform macular scar subfoveal OD, yet with less  activity currently at 6-week post recent injection, wandering baseline and computer-generated results and erroneous thickness measure overall less intraretinal and subretinal fluid today, repeat injection today OD  OS, 4 week post injection still improving, yet with intraretinal fluid Temporal to the macula,      Intravitreal Injection, Pharmacologic Agent - OS - Left Eye       Time Out 01/11/2021. 2:53 PM. Confirmed correct patient, procedure, site, and patient consented.   Anesthesia Topical anesthesia was used. Anesthetic medications included [place] 3.5%.   Procedure Preparation included Ofloxacin , Tobramycin 0.3%, 10% betadine to eyelids, 5% betadine to ocular surface. A supplied needle was used.   Injection: 2.5 mg bevacizumab 2.5 MG/0.1ML   Route: Intravitreal, Site: Left Eye   NDC: [phone], Lot: [id number]   Post-op Post injection exam found visual acuity of at least counting fingers. The patient tolerated the procedure well. There were no complications. The patient received written and verbal post procedure care education. Post injection medications were not given.              ASSESSMENT/PLAN:  Exudative age-related macular degeneration of left eye with active choroidal neovascularization (HCC) Chronic active with intraretinal fluid and CME temporal to fovea still active at 6-week interval.  We will repeat injection OD OS today and examination OS again in 6 weeks  Exudative age-related macular degeneration of right eye with active choroidal neovascularization ([place]) OD less active today  at 1 week nearly 2 weeks postinjection follow-up as scheduled     ICD-10-CM   1. Exudative age-related macular degeneration of left eye with active choroidal neovascularization (HCC)  H35.3221 OCT, Retina - OU - Both Eyes    Intravitreal Injection, Pharmacologic Agent - OS - Left Eye    bevacizumab (AVASTIN) SOSY 2.5 mg    2. Exudative age-related macular degeneration of right eye  with active choroidal neovascularization ([place])  H35.3211       1.  OS with active CNVM currently at 6-week follow-up interval.  Repeat injection OS today  2.  OD overall improved with subfoveal pigment epithelial detachment vascularized and nearly 2-week postinjection follow-up as scheduled  3.  Ophthalmic Meds Ordered this visit:  Meds ordered this encounter  Medications   bevacizumab (AVASTIN) SOSY 2.5 mg       Return in about 6 weeks (around 02/22/2021) for dilate, OS, AVASTIN OCT.  There are no Patient Instructions on file for this visit.   Explained the diagnoses, plan, and follow up with the patient and they expressed understanding.  Patient expressed understanding of the importance of proper follow up care.   Clent Demark Jalicia Roszak M.D. Diseases & Surgery of the Retina and Vitreous [facility] [place] 01/11/21     Abbreviations: M myopia (nearsighted); A astigmatism; H hyperopia (farsighted); P presbyopia; Mrx spectacle prescription;  CTL contact lenses; OD right eye; OS left eye; OU both eyes  XT exotropia; ET esotropia; PEK punctate epithelial keratitis; PEE punctate epithelial erosions; DES dry eye syndrome; MGD meibomian gland dysfunction; ATs artificial tears; PFAT's preservative free artificial tears; [place] nuclear sclerotic cataract; PSC posterior subcapsular cataract; ERM epi-retinal membrane; PVD posterior vitreous detachment; RD retinal detachment; DM diabetes mellitus; DR diabetic retinopathy; NPDR non-proliferative diabetic retinopathy; PDR proliferative diabetic retinopathy; CSME clinically significant macular edema; DME diabetic macular edema; dbh dot blot hemorrhages; CWS cotton wool spot; POAG primary open angle glaucoma; C/D cup-to-disc ratio; HVF humphrey visual field; GVF goldmann visual field; OCT optical coherence tomography; IOP intraocular pressure; BRVO Branch retinal vein occlusion; CRVO central retinal vein occlusion; CRAO central retinal artery  occlusion; BRAO branch retinal artery occlusion; RT retinal tear; SB scleral buckle; PPV pars plana vitrectomy; VH Vitreous hemorrhage; PRP panretinal laser photocoagulation; IVK intravitreal kenalog; VMT vitreomacular traction; MH Macular hole;  NVD neovascularization of the disc; NVE neovascularization elsewhere; AREDS age related eye disease study; ARMD age related macular degeneration; POAG primary open angle glaucoma; EBMD epithelial/anterior basement membrane dystrophy; ACIOL anterior chamber intraocular lens; IOL intraocular lens; PCIOL posterior chamber intraocular lens; Phaco/IOL phacoemulsification with intraocular lens placement; [place] photorefractive keratectomy; LASIK laser assisted in situ keratomileusis; HTN hypertension; DM diabetes mellitus; COPD chronic obstructive pulmonary disease

## 2021-01-31 DIAGNOSIS — I5030 Unspecified diastolic (congestive) heart failure: Secondary | ICD-10-CM | POA: Diagnosis not present

## 2021-01-31 DIAGNOSIS — I4821 Permanent atrial fibrillation: Secondary | ICD-10-CM | POA: Diagnosis not present

## 2021-01-31 DIAGNOSIS — R7303 Prediabetes: Secondary | ICD-10-CM | POA: Diagnosis not present

## 2021-01-31 DIAGNOSIS — Z79899 Other long term (current) drug therapy: Secondary | ICD-10-CM | POA: Diagnosis not present

## 2021-02-08 DIAGNOSIS — N3000 Acute cystitis without hematuria: Secondary | ICD-10-CM | POA: Diagnosis not present

## 2021-02-08 DIAGNOSIS — I4821 Permanent atrial fibrillation: Secondary | ICD-10-CM | POA: Diagnosis not present

## 2021-02-08 DIAGNOSIS — I5032 Chronic diastolic (congestive) heart failure: Secondary | ICD-10-CM | POA: Diagnosis not present

## 2021-02-08 DIAGNOSIS — R7309 Other abnormal glucose: Secondary | ICD-10-CM | POA: Diagnosis not present

## 2021-02-10 ENCOUNTER — Other Ambulatory Visit: Payer: Self-pay

## 2021-02-10 ENCOUNTER — Ambulatory Visit (INDEPENDENT_AMBULATORY_CARE_PROVIDER_SITE_OTHER): Payer: PPO | Admitting: Ophthalmology

## 2021-02-10 ENCOUNTER — Encounter (INDEPENDENT_AMBULATORY_CARE_PROVIDER_SITE_OTHER): Payer: Self-pay | Admitting: Ophthalmology

## 2021-02-10 DIAGNOSIS — H353211 Exudative age-related macular degeneration, right eye, with active choroidal neovascularization: Secondary | ICD-10-CM

## 2021-02-10 DIAGNOSIS — H35721 Serous detachment of retinal pigment epithelium, right eye: Secondary | ICD-10-CM | POA: Diagnosis not present

## 2021-02-10 MED ORDER — BEVACIZUMAB 2.5 MG/0.1ML IZ SOSY
2.5000 mg | PREFILLED_SYRINGE | INTRAVITREAL | Status: AC | PRN
  Administered 2021-02-10: 2.5 mg via INTRAVITREAL

## 2021-02-10 NOTE — Progress Notes (Signed)
02/10/2021     CHIEF COMPLAINT Patient presents for Retina Follow Up (6 week fu OD and Avastin OD/Pt states VA OU stable since last visit. Pt denies FOL, floaters, or ocular pain OU. /Pt reports using Cosopt BID OD/)   HISTORY OF PRESENT ILLNESS: [name] is a 87 y.o. female who presents to the clinic today for:   HPI     Retina Follow Up           Diagnosis: Wet AMD   Laterality: right eye   Onset: 6 weeks ago   Severity: mild   Duration: 6 weeks   Course: stable   Comments: 6 week fu OD and Avastin OD Pt states VA OU stable since last visit. Pt denies FOL, floaters, or ocular pain OU.  Pt reports using Cosopt BID OD        Last edited by Kendra Opitz, COA on 02/10/2021  8:38 AM.      Referring physician: Asencion Noble, MD [address] [place],  [place] [address]  HISTORICAL INFORMATION:   Selected notes from the MEDICAL RECORD NUMBER       CURRENT MEDICATIONS: Current Outpatient Medications (Ophthalmic Drugs)  Medication Sig   dorzolamide-timolol (COSOPT) 22.3-6.8 MG/ML ophthalmic solution Place 1 drop into the right eye 2 (two) times daily.    Latanoprostene Bunod 0.024 % SOLN Place 1 drop into the right eye at bedtime.    No current facility-administered medications for this visit. (Ophthalmic Drugs)   Current Outpatient Medications (Other)  Medication Sig   acetaminophen (TYLENOL) 500 MG tablet Take 500 mg by mouth every 6 (six) hours as needed. Patient reports that she may take 1 or 2 times a day for hip pain.   albuterol (VENTOLIN HFA) 108 (90 Base) MCG/ACT inhaler Inhale 2 puffs into the lungs every 4 (four) hours as needed.   carvedilol (COREG) 12.5 MG tablet Take 12.5 mg by mouth 2 (two) times daily with a meal.    ELIQUIS 5 MG TABS tablet TAKE (1) TABLET BY MOUTH TWICE DAILY FOR BLOOD THINNER.   furosemide (LASIX) 40 MG tablet TAKE ONE TABLET BY MOUTH ONCE DAILY.   losartan (COZAAR) 25 MG tablet Take 1 tablet (25 mg total) by mouth 2  (two) times daily.   Multiple Vitamins-Minerals (VISION FORMULA EYE HEALTH PO) Take 1 tablet by mouth 2 (two) times daily.    pantoprazole (PROTONIX) 40 MG tablet Take 1 tablet (40 mg total) by mouth daily before breakfast.   pramoxine-hydrocortisone (PROCTOCREAM-HC) 1-1 % rectal cream Place 1 application rectally 2 (two) times daily as needed for hemorrhoids or anal itching.   No current facility-administered medications for this visit. (Other)      REVIEW OF SYSTEMS:    ALLERGIES Allergies  Allergen Reactions   Atorvastatin Other (See Comments)    Myalgias   Hydrocodone Other (See Comments)    GI distress.    PAST MEDICAL HISTORY Past Medical History:  Diagnosis Date   Anxiety    Arteriosclerotic cardiovascular disease (ASCVD)     cath Feb '07- no obstructive dz - 20% proximal LAD only; EF 65%; possible coronary artery spasm   Atrial fibrillation (HCC)    Paroxysmal on event recorder in 2009; regular supraventricular tachycardia at a rate of 150 also identified on that study representing either atrial flutter or PSVT; onset of persistent AF in 03/2011   Carcinoma of breast ([place]) 2001   Left mastectomy with positive nodes   Chest pain  Long-standing and atypical   Chronic anticoagulation 04/12/2011   GERD (gastroesophageal reflux disease)    hiatal hernia   Herpes zoster    Hyperlipidemia    Lipid profile in 11/2008:282, 176, 64, 201   Hypertension    diastolic dysfunction; normal CMet and TSH in 11/2009; normal CBC in 04/2010   Malignant neoplasm of breast (female), unspecified site 05/27/2013   right breast lumpectomy with positive node   Peripheral neuropathy    [place] 1946   at age 49   Rectal bleed    Renal insufficiency    Shortness of breath dyspnea    Vitreous hemorrhage of right eye ([place]) 10/23/2019   Past Surgical History:  Procedure Laterality Date   BREAST LUMPECTOMY WITH NEEDLE LOCALIZATION AND AXILLARY SENTINEL LYMPH NODE BX Right 05/27/2013    Procedure: BREAST LUMPECTOMY WITH NEEDLE LOCALIZATION AND AXILLARY SENTINEL LYMPH NODE BX;  Surgeon: Pedro Earls, MD;  Location: [facility];  Service: General;  Laterality: Right;   [place]   COLONOSCOPY  02/22/2011   Procedure: COLONOSCOPY;  Surgeon: Rogene Houston, MD;  [facility];  Service: Endoscopy;  Laterality: N/A;; performed for scant hematochezia   DILATION AND CURETTAGE OF UTERUS     ESOPHAGOGASTRODUODENOSCOPY  02/22/2011   Procedure: ESOPHAGOGASTRODUODENOSCOPY (EGD);  Surgeon: Rogene Houston, MD;  [facility];  Service: Endoscopy;  Laterality: N/A;   ESOPHAGOGASTRODUODENOSCOPY N/A 06/02/2014   Procedure: ESOPHAGOGASTRODUODENOSCOPY (EGD);  Surgeon: Rogene Houston, MD;  [facility];  Service: Endoscopy;  Laterality: N/A;  730   EYE SURGERY     both cataracts   MALONEY DILATION N/A 06/02/2014   Procedure: MALONEY DILATION;  Surgeon: Rogene Houston, MD;  [facility];  Service: Endoscopy;  Laterality: N/A;   MASTECTOMY  2001   Left;modified radical with lymph node dissection   PORT-A-CATH REMOVAL     pac in and out 2002    FAMILY HISTORY Family History  Problem Relation Age of Onset   Cancer Sister        lung   Cancer Brother        lipoma   Cancer Brother        kidney    SOCIAL HISTORY Social History   Tobacco Use   Smoking status: Never   Smokeless tobacco: Never  Vaping Use   Vaping Use: Never used  Substance Use Topics   Alcohol use: No    Alcohol/week: 0.0 standard drinks   Drug use: No         OPHTHALMIC EXAM:  Base Eye Exam     Visual Acuity (ETDRS)       Right Left   Dist [place] 20/100 -1 20/80 -1   Dist ph [place] NI NI         Tonometry (Tonopen, 8:42 AM)       Right Left   Pressure 21 15         Pupils       Pupils Dark Light Shape React APD   Right PERRL 5 4 Round Slow None   Left PERRL 5 4 Round Slow None         Visual  Fields (Counting fingers)       Left Right    Full Full         Extraocular Movement       Right Left    Full Full  Neuro/Psych     Oriented x3: Yes   Mood/Affect: Normal         Dilation     Right eye: 1.0% Mydriacyl, 2.5% Phenylephrine @ 8:42 AM           Slit Lamp and Fundus Exam     External Exam       Right Left   External Normal Normal         Slit Lamp Exam       Right Left   Lids/Lashes Normal Normal   Conjunctiva/Sclera White and quiet White and quiet   Cornea Clear Clear   Anterior Chamber Deep and quiet Deep and quiet   Iris Round and reactive Round and reactive   Lens Posterior chamber intraocular lens Posterior chamber intraocular lens   Anterior Vitreous Normal Normal         Fundus Exam       Right Left   Posterior Vitreous Vitrectomized    Disc Normal    C/D Ratio 0.45    Macula Retinal pigment epithelial mottling, Retinal pigment epithelial detachment, Macular thickening, Retinal pigment epithelial atrophy signifying old RPE rip temporal to fovea subfoveal., Intraretinal hemorrhage, persistent subretinal hemorrhage, Geographic atrophy    Vessels Normal    Periphery Normal             IMAGING AND PROCEDURES  Imaging and Procedures for 02/10/21  OCT, Retina - OU - Both Eyes       Right Eye Quality was borderline. Scan locations included subfoveal. Central Foveal Thickness: 373. Progression has been stable. Findings include abnormal foveal contour.   Left Eye Quality was good. Scan locations included subfoveal. Central Foveal Thickness: 269. Progression has been stable. Findings include abnormal foveal contour.   Notes Disciform macular scar subfoveal OD, yet with less activity currently at 6-week post recent injection, wandering baseline and computer-generated results and erroneous thickness measure overall less intraretinal and subretinal fluid today, repeat injection today OD  OS, 4 week post injection  still improving, yet with intraretinal fluid Temporal to the macula,      Intravitreal Injection, Pharmacologic Agent - OD - Right Eye       Time Out 02/10/2021. 9:22 AM. Confirmed correct patient, procedure, site, and patient consented.   Anesthesia Topical anesthesia was used. Anesthetic medications included [place] 3.5%.   Procedure Preparation included Tobramycin 0.3%, 10% betadine to eyelids, Ofloxacin , 5% betadine to ocular surface. A 30 gauge needle was used.   Injection: 2.5 mg bevacizumab 2.5 MG/0.1ML   Route: Intravitreal, Site: Right Eye   NDC: [phone], Lot: [id number]   Post-op Post injection exam found visual acuity of at least counting fingers. The patient tolerated the procedure well. There were no complications. The patient received written and verbal post procedure care education. Post injection medications were not given.              ASSESSMENT/PLAN:  Exudative age-related macular degeneration of right eye with active choroidal neovascularization (HCC) The nature of wet macular degeneration was discussed with the patient.  Forms of therapy reviewed include the use of Anti-VEGF medications injected painlessly into the eye, as well as other possible treatment modalities, including thermal laser therapy. Fellow eye involvement and risks were discussed with the patient. Upon the finding of wet age related macular degeneration, treatment will be offered. The treatment regimen is on a treat as needed basis with the intent to treat if necessary and extend interval of exams when possible. On average 1  out of 6 patients do not need lifetime therapy. However, the risk of recurrent disease is high for a lifetime.  Initially monthly, then periodic, examinations and evaluations will determine whether the next treatment is required on the day of the examination.  Macular pigment epithelial detachment, right OD, RPE rip, vascularized PED chronically active     ICD-10-CM    1. Exudative age-related macular degeneration of right eye with active choroidal neovascularization (HCC)  H35.3211 OCT, Retina - OU - Both Eyes    Intravitreal Injection, Pharmacologic Agent - OD - Right Eye    bevacizumab (AVASTIN) SOSY 2.5 mg    2. Macular pigment epithelial detachment, right  H35.721       1.  OU with chronically active CNVM.  Subretinal fluid from vascularized PED in the right eye remains.  Change in acuity at 6-week interval.  We will repeat injection OD today and follow-up next OD in 5 weeks  2.  OS follow-up as scheduled in 2 weeks  3.  Ophthalmic Meds Ordered this visit:  Meds ordered this encounter  Medications   bevacizumab (AVASTIN) SOSY 2.5 mg       Return in about 5 weeks (around 03/17/2021) for dilate, OD, AVASTIN OCT.  There are no Patient Instructions on file for this visit.   Explained the diagnoses, plan, and follow up with the patient and they expressed understanding.  Patient expressed understanding of the importance of proper follow up care.   Clent Demark Saleen Peden M.D. Diseases & Surgery of the Retina and Vitreous [facility] [place] 02/10/21     Abbreviations: M myopia (nearsighted); A astigmatism; H hyperopia (farsighted); P presbyopia; Mrx spectacle prescription;  CTL contact lenses; OD right eye; OS left eye; OU both eyes  XT exotropia; ET esotropia; PEK punctate epithelial keratitis; PEE punctate epithelial erosions; DES dry eye syndrome; MGD meibomian gland dysfunction; ATs artificial tears; PFAT's preservative free artificial tears; [place] nuclear sclerotic cataract; PSC posterior subcapsular cataract; ERM epi-retinal membrane; PVD posterior vitreous detachment; RD retinal detachment; DM diabetes mellitus; DR diabetic retinopathy; NPDR non-proliferative diabetic retinopathy; PDR proliferative diabetic retinopathy; CSME clinically significant macular edema; DME diabetic macular edema; dbh dot blot hemorrhages; CWS cotton wool spot;  POAG primary open angle glaucoma; C/D cup-to-disc ratio; HVF humphrey visual field; GVF goldmann visual field; OCT optical coherence tomography; IOP intraocular pressure; BRVO Branch retinal vein occlusion; CRVO central retinal vein occlusion; CRAO central retinal artery occlusion; BRAO branch retinal artery occlusion; RT retinal tear; SB scleral buckle; PPV pars plana vitrectomy; VH Vitreous hemorrhage; PRP panretinal laser photocoagulation; IVK intravitreal kenalog; VMT vitreomacular traction; MH Macular hole;  NVD neovascularization of the disc; NVE neovascularization elsewhere; AREDS age related eye disease study; ARMD age related macular degeneration; POAG primary open angle glaucoma; EBMD epithelial/anterior basement membrane dystrophy; ACIOL anterior chamber intraocular lens; IOL intraocular lens; PCIOL posterior chamber intraocular lens; Phaco/IOL phacoemulsification with intraocular lens placement; [place] photorefractive keratectomy; LASIK laser assisted in situ keratomileusis; HTN hypertension; DM diabetes mellitus; COPD chronic obstructive pulmonary disease

## 2021-02-10 NOTE — Assessment & Plan Note (Signed)

## 2021-02-10 NOTE — Assessment & Plan Note (Signed)
OD, RPE rip, vascularized PED chronically active

## 2021-02-18 DIAGNOSIS — H353231 Exudative age-related macular degeneration, bilateral, with active choroidal neovascularization: Secondary | ICD-10-CM | POA: Diagnosis not present

## 2021-02-18 DIAGNOSIS — H401112 Primary open-angle glaucoma, right eye, moderate stage: Secondary | ICD-10-CM | POA: Diagnosis not present

## 2021-02-18 DIAGNOSIS — H26493 Other secondary cataract, bilateral: Secondary | ICD-10-CM | POA: Diagnosis not present

## 2021-02-22 ENCOUNTER — Encounter (INDEPENDENT_AMBULATORY_CARE_PROVIDER_SITE_OTHER): Payer: Self-pay | Admitting: Ophthalmology

## 2021-02-22 ENCOUNTER — Ambulatory Visit (INDEPENDENT_AMBULATORY_CARE_PROVIDER_SITE_OTHER): Payer: PPO | Admitting: Ophthalmology

## 2021-02-22 ENCOUNTER — Other Ambulatory Visit: Payer: Self-pay

## 2021-02-22 ENCOUNTER — Encounter (INDEPENDENT_AMBULATORY_CARE_PROVIDER_SITE_OTHER): Payer: PPO | Admitting: Ophthalmology

## 2021-02-22 DIAGNOSIS — H353211 Exudative age-related macular degeneration, right eye, with active choroidal neovascularization: Secondary | ICD-10-CM

## 2021-02-22 DIAGNOSIS — H353221 Exudative age-related macular degeneration, left eye, with active choroidal neovascularization: Secondary | ICD-10-CM | POA: Diagnosis not present

## 2021-02-22 MED ORDER — BEVACIZUMAB 2.5 MG/0.1ML IZ SOSY
2.5000 mg | PREFILLED_SYRINGE | INTRAVITREAL | Status: AC | PRN
  Administered 2021-02-22: 2.5 mg via INTRAVITREAL

## 2021-02-22 NOTE — Progress Notes (Signed)
02/22/2021     CHIEF COMPLAINT Patient presents for Retina Follow Up (6 week fu OD and Avastin OD/Pt states VA OU stable since last visit. Pt denies FOL, floaters, or ocular pain OU. /Pt reports using Cosopt BID OD/)   HISTORY OF PRESENT ILLNESS: [name] is a 87 y.o. female who presents to the clinic today for:   HPI     Retina Follow Up           Diagnosis: Wet AMD   Laterality: left eye   Onset: 6 weeks ago   Severity: mild   Duration: 6 weeks   Course: stable   Comments: 6 week fu OD and Avastin OD Pt states VA OU stable since last visit. Pt denies FOL, floaters, or ocular pain OU.  Pt reports using Cosopt BID OD          Comments   6 week fu os oct avastin os Patient states vision is stable and unchanged since last visit. Denies any new floaters or FOL. Patient states "I had a laser procedure Friday, last week by Dr. Manuella Ghazi, he said I had a film over my lens from after cataract surgery. I have a follow up with him in one month for a post op and he said he might give me glasses then." Pt. states she uses dorz- tim bid OD, latanoprost od qhs.       Last edited by Laurin Coder, COA on 02/22/2021  2:08 PM.      Referring physician: Asencion Noble, MD [address] [place],  [place] [address]  HISTORICAL INFORMATION:   Selected notes from the MEDICAL RECORD NUMBER       CURRENT MEDICATIONS: Current Outpatient Medications (Ophthalmic Drugs)  Medication Sig   dorzolamide-timolol (COSOPT) 22.3-6.8 MG/ML ophthalmic solution Place 1 drop into the right eye 2 (two) times daily.    Latanoprostene Bunod 0.024 % SOLN Place 1 drop into the right eye at bedtime.    No current facility-administered medications for this visit. (Ophthalmic Drugs)   Current Outpatient Medications (Other)  Medication Sig   acetaminophen (TYLENOL) 500 MG tablet Take 500 mg by mouth every 6 (six) hours as needed. Patient reports that she may take 1 or 2 times a day for hip pain.    albuterol (VENTOLIN HFA) 108 (90 Base) MCG/ACT inhaler Inhale 2 puffs into the lungs every 4 (four) hours as needed.   carvedilol (COREG) 12.5 MG tablet Take 12.5 mg by mouth 2 (two) times daily with a meal.    ELIQUIS 5 MG TABS tablet TAKE (1) TABLET BY MOUTH TWICE DAILY FOR BLOOD THINNER.   furosemide (LASIX) 40 MG tablet TAKE ONE TABLET BY MOUTH ONCE DAILY.   losartan (COZAAR) 25 MG tablet Take 1 tablet (25 mg total) by mouth 2 (two) times daily.   Multiple Vitamins-Minerals (VISION FORMULA EYE HEALTH PO) Take 1 tablet by mouth 2 (two) times daily.    pantoprazole (PROTONIX) 40 MG tablet Take 1 tablet (40 mg total) by mouth daily before breakfast.   pramoxine-hydrocortisone (PROCTOCREAM-HC) 1-1 % rectal cream Place 1 application rectally 2 (two) times daily as needed for hemorrhoids or anal itching.   No current facility-administered medications for this visit. (Other)      REVIEW OF SYSTEMS:    ALLERGIES Allergies  Allergen Reactions   Atorvastatin Other (See Comments)    Myalgias   Hydrocodone Other (See Comments)    GI distress.    PAST MEDICAL HISTORY Past  Medical History:  Diagnosis Date   Anxiety    Arteriosclerotic cardiovascular disease (ASCVD)     cath Feb '07- no obstructive dz - 20% proximal LAD only; EF 65%; possible coronary artery spasm   Atrial fibrillation (HCC)    Paroxysmal on event recorder in 2009; regular supraventricular tachycardia at a rate of 150 also identified on that study representing either atrial flutter or PSVT; onset of persistent AF in 03/2011   Carcinoma of breast ([place]) 2001   Left mastectomy with positive nodes   Chest pain    Long-standing and atypical   Chronic anticoagulation 04/12/2011   GERD (gastroesophageal reflux disease)    hiatal hernia   Herpes zoster    Hyperlipidemia    Lipid profile in 11/2008:282, 176, 64, 201   Hypertension    diastolic dysfunction; normal CMet and TSH in 11/2009; normal CBC in 04/2010   Malignant  neoplasm of breast (female), unspecified site 05/27/2013   right breast lumpectomy with positive node   Peripheral neuropathy    [place] 1946   at age 34   Rectal bleed    Renal insufficiency    Shortness of breath dyspnea    Vitreous hemorrhage of right eye ([place]) 10/23/2019   Past Surgical History:  Procedure Laterality Date   BREAST LUMPECTOMY WITH NEEDLE LOCALIZATION AND AXILLARY SENTINEL LYMPH NODE BX Right 05/27/2013   Procedure: BREAST LUMPECTOMY WITH NEEDLE LOCALIZATION AND AXILLARY SENTINEL LYMPH NODE BX;  Surgeon: Pedro Earls, MD;  Location: [facility];  Service: General;  Laterality: Right;   [place]   COLONOSCOPY  02/22/2011   Procedure: COLONOSCOPY;  Surgeon: Rogene Houston, MD;  [facility];  Service: Endoscopy;  Laterality: N/A;; performed for scant hematochezia   DILATION AND CURETTAGE OF UTERUS     ESOPHAGOGASTRODUODENOSCOPY  02/22/2011   Procedure: ESOPHAGOGASTRODUODENOSCOPY (EGD);  Surgeon: Rogene Houston, MD;  [facility];  Service: Endoscopy;  Laterality: N/A;   ESOPHAGOGASTRODUODENOSCOPY N/A 06/02/2014   Procedure: ESOPHAGOGASTRODUODENOSCOPY (EGD);  Surgeon: Rogene Houston, MD;  [facility];  Service: Endoscopy;  Laterality: N/A;  730   EYE SURGERY     both cataracts   MALONEY DILATION N/A 06/02/2014   Procedure: MALONEY DILATION;  Surgeon: Rogene Houston, MD;  [facility];  Service: Endoscopy;  Laterality: N/A;   MASTECTOMY  2001   Left;modified radical with lymph node dissection   PORT-A-CATH REMOVAL     pac in and out 2002    FAMILY HISTORY Family History  Problem Relation Age of Onset   Cancer Sister        lung   Cancer Brother        lipoma   Cancer Brother        kidney    SOCIAL HISTORY Social History   Tobacco Use   Smoking status: Never   Smokeless tobacco: Never  Vaping Use   Vaping Use: Never used  Substance Use Topics   Alcohol use:  No    Alcohol/week: 0.0 standard drinks   Drug use: No         OPHTHALMIC EXAM:  Base Eye Exam     Visual Acuity (ETDRS)       Right Left   Dist [place] 20/60 -2 20/70 +2   Dist ph [place] NI 20/50 -1         Tonometry (Tonopen, 2:15 PM)  Right Left   Pressure 22 25         Pupils       Pupils Dark Light Shape React APD   Right PERRL 5 4 Round Slow None   Left PERRL 5 4 Round Slow None         Visual Fields (Counting fingers)       Left Right    Full Full         Extraocular Movement       Right Left    Full Full         Neuro/Psych     Oriented x3: Yes   Mood/Affect: Normal         Dilation     Both eyes: 1.0% Mydriacyl, 2.5% Phenylephrine @ 2:15 PM           Slit Lamp and Fundus Exam     External Exam       Right Left   External Normal Normal         Slit Lamp Exam       Right Left   Lids/Lashes Normal Normal   Conjunctiva/Sclera White and quiet White and quiet   Cornea Clear Clear   Anterior Chamber Deep and quiet Deep and quiet   Iris Round and reactive Round and reactive   Lens Posterior chamber intraocular lens, Open posterior capsule Posterior chamber intraocular lens   Anterior Vitreous Normal Normal         Fundus Exam       Right Left   Posterior Vitreous clear , vitrectomized Posterior vitreous detachment   Disc Normal Normal   C/D Ratio 0.35 0.3   Macula Geographic atrophy, Intermediate age related macular degeneration Disciform scar, [place], no exudates, no hemorrhage, no macular thickening, Mottling, Retinal pigment epithelial mottling, Atrophy, Retinal pigment epithelial atrophy, Age related macular degeneration, Early age related macular degeneration, Geographic atrophy near the FAZ, Pigmented atrophy, Hard drusen,   Vessels Normal Normal   Periphery Normal, no holes Normal, no holes            IMAGING AND PROCEDURES  Imaging and Procedures for 02/22/21  OCT, Retina - OU - Both Eyes        Right Eye Quality was borderline. Scan locations included subfoveal. Central Foveal Thickness: 458. Progression has been stable. Findings include abnormal foveal contour, pigment epithelial detachment.   Left Eye Quality was good. Scan locations included subfoveal. Central Foveal Thickness: 264. Progression has been stable. Findings include abnormal foveal contour.   Notes Disciform macular scar subfoveal OD, yet with less activity currently at 1-week post recent injection, wandering baseline and computer-generated results and erroneous thickness measure overall less intraretinal and subretinal fluid today, repeat injection today OD  OS, 6 week post injection still improving, yet with intraretinal fluid Temporal to the macula,      Intravitreal Injection, Pharmacologic Agent - OS - Left Eye       Time Out 02/22/2021. 2:40 PM. Confirmed correct patient, procedure, site, and patient consented.   Anesthesia Topical anesthesia was used. Anesthetic medications included [place] 3.5%.   Procedure Preparation included Ofloxacin , Tobramycin 0.3%, 10% betadine to eyelids, 5% betadine to ocular surface. A supplied needle was used.   Injection: 2.5 mg bevacizumab 2.5 MG/0.1ML   Route: Intravitreal, Site: Left Eye   NDC: [phone], Lot: [id number]   Post-op Post injection exam found visual acuity of at least counting fingers. The patient tolerated the procedure well. There were no complications. The  patient received written and verbal post procedure care education. Post injection medications were not given.              ASSESSMENT/PLAN:  Exudative age-related macular degeneration of left eye with active choroidal neovascularization (HCC) At 6-week interval stable yet still persistent intraretinal fluid temporal aspect of the fovea repeat injection Avastin OS today and examination again in 6 weeks  Exudative age-related macular degeneration of right eye with active choroidal  neovascularization ([place]) At over 1 week follow-up stable and improving post Avastin     ICD-10-CM   1. Exudative age-related macular degeneration of left eye with active choroidal neovascularization (HCC)  H35.3221 OCT, Retina - OU - Both Eyes    Intravitreal Injection, Pharmacologic Agent - OS - Left Eye    bevacizumab (AVASTIN) SOSY 2.5 mg    2. Exudative age-related macular degeneration of right eye with active choroidal neovascularization (HCC)  H35.3211       1.  OS, with persistent intraretinal fluid 6 weeks post Avastin repeat injection today  2.  OD, geographic atrophy as well as prior CNVM active OD.  Subfoveal macular RPE detachment remains OD.  3.  Ophthalmic Meds Ordered this visit:  Meds ordered this encounter  Medications   bevacizumab (AVASTIN) SOSY 2.5 mg       Return in about 6 weeks (around 04/05/2021) for dilate, OS, AVASTIN OCT,, and follow-up OD as scheduled Avastin OCT.  There are no Patient Instructions on file for this visit.   Explained the diagnoses, plan, and follow up with the patient and they expressed understanding.  Patient expressed understanding of the importance of proper follow up care.   Clent Demark Brailyn Delman M.D. Diseases & Surgery of the Retina and Vitreous Retina & Diabetic [place] 02/22/21     Abbreviations: M myopia (nearsighted); A astigmatism; H hyperopia (farsighted); P presbyopia; Mrx spectacle prescription;  CTL contact lenses; OD right eye; OS left eye; OU both eyes  XT exotropia; ET esotropia; PEK punctate epithelial keratitis; PEE punctate epithelial erosions; DES dry eye syndrome; MGD meibomian gland dysfunction; ATs artificial tears; PFAT's preservative free artificial tears; [place] nuclear sclerotic cataract; PSC posterior subcapsular cataract; ERM epi-retinal membrane; PVD posterior vitreous detachment; RD retinal detachment; DM diabetes mellitus; DR diabetic retinopathy; NPDR non-proliferative diabetic retinopathy; PDR  proliferative diabetic retinopathy; CSME clinically significant macular edema; DME diabetic macular edema; dbh dot blot hemorrhages; CWS cotton wool spot; POAG primary open angle glaucoma; C/D cup-to-disc ratio; HVF humphrey visual field; GVF goldmann visual field; OCT optical coherence tomography; IOP intraocular pressure; BRVO Branch retinal vein occlusion; CRVO central retinal vein occlusion; CRAO central retinal artery occlusion; BRAO branch retinal artery occlusion; RT retinal tear; SB scleral buckle; PPV pars plana vitrectomy; VH Vitreous hemorrhage; PRP panretinal laser photocoagulation; IVK intravitreal kenalog; VMT vitreomacular traction; MH Macular hole;  NVD neovascularization of the disc; NVE neovascularization elsewhere; AREDS age related eye disease study; ARMD age related macular degeneration; POAG primary open angle glaucoma; EBMD epithelial/anterior basement membrane dystrophy; ACIOL anterior chamber intraocular lens; IOL intraocular lens; PCIOL posterior chamber intraocular lens; Phaco/IOL phacoemulsification with intraocular lens placement; [place] photorefractive keratectomy; LASIK laser assisted in situ keratomileusis; HTN hypertension; DM diabetes mellitus; COPD chronic obstructive pulmonary disease

## 2021-02-22 NOTE — Assessment & Plan Note (Signed)
At over 1 week follow-up stable and improving post Avastin

## 2021-02-22 NOTE — Assessment & Plan Note (Signed)
At 6-week interval stable yet still persistent intraretinal fluid temporal aspect of the fovea repeat injection Avastin OS today and examination again in 6 weeks

## 2021-03-17 ENCOUNTER — Other Ambulatory Visit: Payer: Self-pay

## 2021-03-17 ENCOUNTER — Ambulatory Visit (INDEPENDENT_AMBULATORY_CARE_PROVIDER_SITE_OTHER): Payer: PPO | Admitting: Ophthalmology

## 2021-03-17 ENCOUNTER — Encounter (INDEPENDENT_AMBULATORY_CARE_PROVIDER_SITE_OTHER): Payer: PPO | Admitting: Ophthalmology

## 2021-03-17 ENCOUNTER — Encounter (INDEPENDENT_AMBULATORY_CARE_PROVIDER_SITE_OTHER): Payer: Self-pay | Admitting: Ophthalmology

## 2021-03-17 DIAGNOSIS — H353211 Exudative age-related macular degeneration, right eye, with active choroidal neovascularization: Secondary | ICD-10-CM | POA: Diagnosis not present

## 2021-03-17 DIAGNOSIS — H35721 Serous detachment of retinal pigment epithelium, right eye: Secondary | ICD-10-CM

## 2021-03-17 DIAGNOSIS — H353221 Exudative age-related macular degeneration, left eye, with active choroidal neovascularization: Secondary | ICD-10-CM | POA: Diagnosis not present

## 2021-03-17 MED ORDER — BEVACIZUMAB 2.5 MG/0.1ML IZ SOSY
2.5000 mg | PREFILLED_SYRINGE | INTRAVITREAL | Status: AC | PRN
  Administered 2021-03-17: 2.5 mg via INTRAVITREAL

## 2021-03-17 NOTE — Assessment & Plan Note (Signed)
Active CNVM with adjacent SR hemorrhage persist yet with overall less thickening today post Avastin at 5-week interval.  Repeat injection today and follow-up again in 5 weeks OD

## 2021-03-17 NOTE — Progress Notes (Signed)
03/17/2021     CHIEF COMPLAINT Patient presents for  Chief Complaint  Patient presents with   Retina Follow Up      HISTORY OF PRESENT ILLNESS: [name] is a 87 y.o. female who presents to the clinic today for:   HPI     Retina Follow Up   Patient presents with  Wet AMD.  In right eye.  This started 5 weeks ago.  Severity is mild.  Duration of 5 weeks.        Comments   5 week f/u OD with OCT and possible Avastin  Pt denies any visual changes since previous visit. Pt states, "It all depends on the light, I need a little more light for reading at home." Pt denies any new floaters or flashes. Pt denies any eye pain.  Eye Meds: Cosopt BID OD Latanoprost QHS OD      Last edited by Reather Littler, COA on 03/17/2021  8:54 AM.      Referring physician: Asencion Noble, MD [address] [place],  [place] [address]  HISTORICAL INFORMATION:   Selected notes from the MEDICAL RECORD NUMBER       CURRENT MEDICATIONS: Current Outpatient Medications (Ophthalmic Drugs)  Medication Sig   dorzolamide-timolol (COSOPT) 22.3-6.8 MG/ML ophthalmic solution Place 1 drop into the right eye 2 (two) times daily.    Latanoprostene Bunod 0.024 % SOLN Place 1 drop into the right eye at bedtime.    No current facility-administered medications for this visit. (Ophthalmic Drugs)   Current Outpatient Medications (Other)  Medication Sig   acetaminophen (TYLENOL) 500 MG tablet Take 500 mg by mouth every 6 (six) hours as needed. Patient reports that she may take 1 or 2 times a day for hip pain.   albuterol (VENTOLIN HFA) 108 (90 Base) MCG/ACT inhaler Inhale 2 puffs into the lungs every 4 (four) hours as needed.   carvedilol (COREG) 12.5 MG tablet Take 12.5 mg by mouth 2 (two) times daily with a meal.    ELIQUIS 5 MG TABS tablet TAKE (1) TABLET BY MOUTH TWICE DAILY FOR BLOOD THINNER.   furosemide (LASIX) 40 MG tablet TAKE ONE TABLET BY MOUTH ONCE DAILY.   losartan (COZAAR) 25 MG  tablet Take 1 tablet (25 mg total) by mouth 2 (two) times daily.   Multiple Vitamins-Minerals (VISION FORMULA EYE HEALTH PO) Take 1 tablet by mouth 2 (two) times daily.    pantoprazole (PROTONIX) 40 MG tablet Take 1 tablet (40 mg total) by mouth daily before breakfast.   pramoxine-hydrocortisone (PROCTOCREAM-HC) 1-1 % rectal cream Place 1 application rectally 2 (two) times daily as needed for hemorrhoids or anal itching.   No current facility-administered medications for this visit. (Other)      REVIEW OF SYSTEMS:    ALLERGIES Allergies  Allergen Reactions   Atorvastatin Other (See Comments)    Myalgias   Hydrocodone Other (See Comments)    GI distress.    PAST MEDICAL HISTORY Past Medical History:  Diagnosis Date   Anxiety    Arteriosclerotic cardiovascular disease (ASCVD)     cath Feb '07- no obstructive dz - 20% proximal LAD only; EF 65%; possible coronary artery spasm   Atrial fibrillation (HCC)    Paroxysmal on event recorder in 2009; regular supraventricular tachycardia at a rate of 150 also identified on that study representing either atrial flutter or PSVT; onset of persistent AF in 03/2011   Carcinoma of breast ([place]) 2001   Left mastectomy with positive  nodes   Chest pain    Long-standing and atypical   Chronic anticoagulation 04/12/2011   GERD (gastroesophageal reflux disease)    hiatal hernia   Herpes zoster    Hyperlipidemia    Lipid profile in 11/2008:282, 176, 64, 201   Hypertension    diastolic dysfunction; normal CMet and TSH in 11/2009; normal CBC in 04/2010   Malignant neoplasm of breast (female), unspecified site 05/27/2013   right breast lumpectomy with positive node   Peripheral neuropathy    [place] 1946   at age 61   Rectal bleed    Renal insufficiency    Shortness of breath dyspnea    Vitreous hemorrhage of right eye ([place]) 10/23/2019   Past Surgical History:  Procedure Laterality Date   BREAST LUMPECTOMY WITH NEEDLE LOCALIZATION AND AXILLARY  SENTINEL LYMPH NODE BX Right 05/27/2013   Procedure: BREAST LUMPECTOMY WITH NEEDLE LOCALIZATION AND AXILLARY SENTINEL LYMPH NODE BX;  Surgeon: Pedro Earls, MD;  Location: [facility];  Service: General;  Laterality: Right;   [place]   COLONOSCOPY  02/22/2011   Procedure: COLONOSCOPY;  Surgeon: Rogene Houston, MD;  [facility];  Service: Endoscopy;  Laterality: N/A;; performed for scant hematochezia   DILATION AND CURETTAGE OF UTERUS     ESOPHAGOGASTRODUODENOSCOPY  02/22/2011   Procedure: ESOPHAGOGASTRODUODENOSCOPY (EGD);  Surgeon: Rogene Houston, MD;  [facility];  Service: Endoscopy;  Laterality: N/A;   ESOPHAGOGASTRODUODENOSCOPY N/A 06/02/2014   Procedure: ESOPHAGOGASTRODUODENOSCOPY (EGD);  Surgeon: Rogene Houston, MD;  [facility];  Service: Endoscopy;  Laterality: N/A;  730   EYE SURGERY     both cataracts   MALONEY DILATION N/A 06/02/2014   Procedure: MALONEY DILATION;  Surgeon: Rogene Houston, MD;  [facility];  Service: Endoscopy;  Laterality: N/A;   MASTECTOMY  2001   Left;modified radical with lymph node dissection   PORT-A-CATH REMOVAL     pac in and out 2002    FAMILY HISTORY Family History  Problem Relation Age of Onset   Cancer Sister        lung   Cancer Brother        lipoma   Cancer Brother        kidney    SOCIAL HISTORY Social History   Tobacco Use   Smoking status: Never   Smokeless tobacco: Never  Vaping Use   Vaping Use: Never used  Substance Use Topics   Alcohol use: No    Alcohol/week: 0.0 standard drinks   Drug use: No         OPHTHALMIC EXAM:  Base Eye Exam     Visual Acuity (ETDRS)       Right Left   Dist [place] 20/60 +2 20/60 -1   Dist ph [place] 20/50 -1         Tonometry (Tonopen, 9:03 AM)       Right Left   Pressure 22 14         Pupils       Pupils Dark Light Shape React APD   Right PERRL 5 4 Round Slow None   Left  PERRL 5 4 Round Slow None         Visual Fields (Counting fingers)       Left Right    Full Full         Extraocular Movement       Right  Left    Full, Ortho Full, Ortho         Neuro/Psych     Oriented x3: Yes   Mood/Affect: Normal         Dilation     Right eye: 1.0% Mydriacyl, 2.5% Phenylephrine @ 9:03 AM           Slit Lamp and Fundus Exam     External Exam       Right Left   External Normal Normal         Slit Lamp Exam       Right Left   Lids/Lashes Normal Normal   Conjunctiva/Sclera White and quiet White and quiet   Cornea Clear Clear   Anterior Chamber Deep and quiet Deep and quiet   Iris Round and reactive Round and reactive   Lens Posterior chamber intraocular lens, Open posterior capsule Posterior chamber intraocular lens   Anterior Vitreous Normal Normal         Fundus Exam       Right Left   Posterior Vitreous clear , vitrectomized Posterior vitreous detachment   Disc Normal Normal   C/D Ratio 0.35 0.3   Macula Geographic atrophy, Intermediate age related macular degeneration Disciform scar, [place], no exudates, no hemorrhage, no macular thickening, Mottling, Retinal pigment epithelial mottling, Atrophy, Retinal pigment epithelial atrophy, Age related macular degeneration, Early age related macular degeneration, Geographic atrophy near the FAZ, Pigmented atrophy, Hard drusen,   Vessels Normal Normal   Periphery Normal, no holes Normal, no holes            IMAGING AND PROCEDURES  Imaging and Procedures for 03/17/21  OCT, Retina - OU - Both Eyes       Right Eye Quality was borderline. Scan locations included subfoveal. Central Foveal Thickness: 348. Progression has been stable. Findings include abnormal foveal contour, pigment epithelial detachment.   Left Eye Quality was good. Scan locations included subfoveal. Central Foveal Thickness: 276. Progression has been stable. Findings include abnormal foveal contour.    Notes Much less overall thickening and subretinal fluid and edema and intraretinal fluid post injection Avastin.  We will need to repeat injection Avastin today  OS, 3 week post injection still improving, yet with intraretinal fluid Temporal to the macula,      Intravitreal Injection, Pharmacologic Agent - OD - Right Eye       Time Out 03/17/2021. 9:42 AM. Confirmed correct patient, procedure, site, and patient consented.   Anesthesia Topical anesthesia was used. Anesthetic medications included [place] 3.5%.   Procedure Preparation included Tobramycin 0.3%, 10% betadine to eyelids, Ofloxacin , 5% betadine to ocular surface. A 30 gauge needle was used.   Injection: 2.5 mg bevacizumab 2.5 MG/0.1ML   Route: Intravitreal, Site: Right Eye   NDC: [phone], Lot: [id number]   Post-op Post injection exam found visual acuity of at least counting fingers. The patient tolerated the procedure well. There were no complications. The patient received written and verbal post procedure care education. Post injection medications were not given.              ASSESSMENT/PLAN:  Exudative age-related macular degeneration of right eye with active choroidal neovascularization (HCC) Active CNVM with adjacent SR hemorrhage persist yet with overall less thickening today post Avastin at 5-week interval.  Repeat injection today and follow-up again in 5 weeks OD  Exudative age-related macular degeneration of left eye with active choroidal neovascularization ([place]) Follow-up as scheduled  Macular pigment epithelial detachment, right Vascularized component of  CNVM OD     ICD-10-CM   1. Exudative age-related macular degeneration of right eye with active choroidal neovascularization (HCC)  H35.3211 OCT, Retina - OU - Both Eyes    Intravitreal Injection, Pharmacologic Agent - OD - Right Eye    bevacizumab (AVASTIN) SOSY 2.5 mg    2. Exudative age-related macular degeneration of left eye with active  choroidal neovascularization ([place])  H35.3221     3. Macular pigment epithelial detachment, right  H35.721       1.  OD, with active CNVM threatening vision loss.  Stabilized and improved thickening on OCT examination today.  Repeat injection today and follow-up again in 5 weeks OD  2.  Dilate OS next as scheduled likely Avastin  3.  Ophthalmic Meds Ordered this visit:  Meds ordered this encounter  Medications   bevacizumab (AVASTIN) SOSY 2.5 mg       Return in about 5 weeks (around 04/21/2021) for dilate, OD, AVASTIN OCT,, and OS as scheduled.  There are no Patient Instructions on file for this visit.   Explained the diagnoses, plan, and follow up with the patient and they expressed understanding.  Patient expressed understanding of the importance of proper follow up care.   Clent Demark Amiera Herzberg M.D. Diseases & Surgery of the Retina and Vitreous [facility] [place] 03/17/21     Abbreviations: M myopia (nearsighted); A astigmatism; H hyperopia (farsighted); P presbyopia; Mrx spectacle prescription;  CTL contact lenses; OD right eye; OS left eye; OU both eyes  XT exotropia; ET esotropia; PEK punctate epithelial keratitis; PEE punctate epithelial erosions; DES dry eye syndrome; MGD meibomian gland dysfunction; ATs artificial tears; PFAT's preservative free artificial tears; [place] nuclear sclerotic cataract; PSC posterior subcapsular cataract; ERM epi-retinal membrane; PVD posterior vitreous detachment; RD retinal detachment; DM diabetes mellitus; DR diabetic retinopathy; NPDR non-proliferative diabetic retinopathy; PDR proliferative diabetic retinopathy; CSME clinically significant macular edema; DME diabetic macular edema; dbh dot blot hemorrhages; CWS cotton wool spot; POAG primary open angle glaucoma; C/D cup-to-disc ratio; HVF humphrey visual field; GVF goldmann visual field; OCT optical coherence tomography; IOP intraocular pressure; BRVO Branch retinal vein occlusion; CRVO  central retinal vein occlusion; CRAO central retinal artery occlusion; BRAO branch retinal artery occlusion; RT retinal tear; SB scleral buckle; PPV pars plana vitrectomy; VH Vitreous hemorrhage; PRP panretinal laser photocoagulation; IVK intravitreal kenalog; VMT vitreomacular traction; MH Macular hole;  NVD neovascularization of the disc; NVE neovascularization elsewhere; AREDS age related eye disease study; ARMD age related macular degeneration; POAG primary open angle glaucoma; EBMD epithelial/anterior basement membrane dystrophy; ACIOL anterior chamber intraocular lens; IOL intraocular lens; PCIOL posterior chamber intraocular lens; Phaco/IOL phacoemulsification with intraocular lens placement; [place] photorefractive keratectomy; LASIK laser assisted in situ keratomileusis; HTN hypertension; DM diabetes mellitus; COPD chronic obstructive pulmonary disease

## 2021-03-17 NOTE — Assessment & Plan Note (Signed)
Vascularized component of CNVM OD

## 2021-03-17 NOTE — Assessment & Plan Note (Signed)
Follow up as scheduled.  

## 2021-04-05 ENCOUNTER — Ambulatory Visit (INDEPENDENT_AMBULATORY_CARE_PROVIDER_SITE_OTHER): Payer: PPO | Admitting: Ophthalmology

## 2021-04-05 ENCOUNTER — Other Ambulatory Visit: Payer: Self-pay

## 2021-04-05 ENCOUNTER — Encounter (INDEPENDENT_AMBULATORY_CARE_PROVIDER_SITE_OTHER): Payer: Self-pay | Admitting: Ophthalmology

## 2021-04-05 DIAGNOSIS — H353211 Exudative age-related macular degeneration, right eye, with active choroidal neovascularization: Secondary | ICD-10-CM

## 2021-04-05 DIAGNOSIS — H353221 Exudative age-related macular degeneration, left eye, with active choroidal neovascularization: Secondary | ICD-10-CM

## 2021-04-05 MED ORDER — BEVACIZUMAB 2.5 MG/0.1ML IZ SOSY
2.5000 mg | PREFILLED_SYRINGE | INTRAVITREAL | Status: AC | PRN
  Administered 2021-04-05: 2.5 mg via INTRAVITREAL

## 2021-04-05 NOTE — Assessment & Plan Note (Signed)
Today at 2.5 weeks postinjection stable yet still active follow-up as scheduled OD

## 2021-04-05 NOTE — Progress Notes (Signed)
04/05/2021     CHIEF COMPLAINT Patient presents for  Chief Complaint  Patient presents with   Retina Follow Up      HISTORY OF PRESENT ILLNESS: [name] is a 87 y.o. female who presents to the clinic today for:   HPI     Retina Follow Up   Patient presents with  Wet AMD.  In left eye.  This started 6 weeks ago.  Severity is mild.  Duration of 6 weeks.  Since onset it is stable.        Comments   6 week fu OS and Avastin OS Pt states VA OU stable since last visit. Pt denies FOL, floaters, or ocular pain OU.  Pt states, "Dr. Brigitte Pulse said that he may need to do more laser on me and he added Rhopressa to my drops because my pressure was up in my right eye last week.":       Last edited by Kendra Opitz, COA on 04/05/2021  2:49 PM.      Referring physician: Asencion Noble, MD [address] [place],  [place] [address]  HISTORICAL INFORMATION:   Selected notes from the [facility]: Current Outpatient Medications (Ophthalmic Drugs)  Medication Sig   dorzolamide-timolol (COSOPT) 22.3-6.8 MG/ML ophthalmic solution Place 1 drop into the right eye 2 (two) times daily.    Latanoprostene Bunod 0.024 % SOLN Place 1 drop into the right eye at bedtime.    No current facility-administered medications for this visit. (Ophthalmic Drugs)   Current Outpatient Medications (Other)  Medication Sig   acetaminophen (TYLENOL) 500 MG tablet Take 500 mg by mouth every 6 (six) hours as needed. Patient reports that she may take 1 or 2 times a day for hip pain.   albuterol (VENTOLIN HFA) 108 (90 Base) MCG/ACT inhaler Inhale 2 puffs into the lungs every 4 (four) hours as needed.   carvedilol (COREG) 12.5 MG tablet Take 12.5 mg by mouth 2 (two) times daily with a meal.    ELIQUIS 5 MG TABS tablet TAKE (1) TABLET BY MOUTH TWICE DAILY FOR BLOOD THINNER.   furosemide (LASIX) 40 MG tablet TAKE ONE TABLET BY MOUTH ONCE DAILY.   losartan (COZAAR) 25 MG  tablet Take 1 tablet (25 mg total) by mouth 2 (two) times daily.   Multiple Vitamins-Minerals (VISION FORMULA EYE HEALTH PO) Take 1 tablet by mouth 2 (two) times daily.    pantoprazole (PROTONIX) 40 MG tablet Take 1 tablet (40 mg total) by mouth daily before breakfast.   pramoxine-hydrocortisone (PROCTOCREAM-HC) 1-1 % rectal cream Place 1 application rectally 2 (two) times daily as needed for hemorrhoids or anal itching.   No current facility-administered medications for this visit. (Other)      REVIEW OF SYSTEMS:    ALLERGIES Allergies  Allergen Reactions   Atorvastatin Other (See Comments)    Myalgias   Hydrocodone Other (See Comments)    GI distress.    PAST MEDICAL HISTORY Past Medical History:  Diagnosis Date   Anxiety    Arteriosclerotic cardiovascular disease (ASCVD)     cath Feb '07- no obstructive dz - 20% proximal LAD only; EF 65%; possible coronary artery spasm   Atrial fibrillation (HCC)    Paroxysmal on event recorder in 2009; regular supraventricular tachycardia at a rate of 150 also identified on that study representing either atrial flutter or PSVT; onset of persistent AF in 03/2011   Carcinoma of breast ([place])  2001   Left mastectomy with positive nodes   Chest pain    Long-standing and atypical   Chronic anticoagulation 04/12/2011   GERD (gastroesophageal reflux disease)    hiatal hernia   Herpes zoster    Hyperlipidemia    Lipid profile in 11/2008:282, 176, 64, 201   Hypertension    diastolic dysfunction; normal CMet and TSH in 11/2009; normal CBC in 04/2010   Malignant neoplasm of breast (female), unspecified site 05/27/2013   right breast lumpectomy with positive node   Peripheral neuropathy    [place] 1946   at age 89   Rectal bleed    Renal insufficiency    Shortness of breath dyspnea    Vitreous hemorrhage of right eye ([place]) 10/23/2019   Past Surgical History:  Procedure Laterality Date   BREAST LUMPECTOMY WITH NEEDLE LOCALIZATION AND AXILLARY  SENTINEL LYMPH NODE BX Right 05/27/2013   Procedure: BREAST LUMPECTOMY WITH NEEDLE LOCALIZATION AND AXILLARY SENTINEL LYMPH NODE BX;  Surgeon: Pedro Earls, MD;  Location: [facility];  Service: General;  Laterality: Right;   [place]   COLONOSCOPY  02/22/2011   Procedure: COLONOSCOPY;  Surgeon: Rogene Houston, MD;  [facility];  Service: Endoscopy;  Laterality: N/A;; performed for scant hematochezia   DILATION AND CURETTAGE OF UTERUS     ESOPHAGOGASTRODUODENOSCOPY  02/22/2011   Procedure: ESOPHAGOGASTRODUODENOSCOPY (EGD);  Surgeon: Rogene Houston, MD;  [facility];  Service: Endoscopy;  Laterality: N/A;   ESOPHAGOGASTRODUODENOSCOPY N/A 06/02/2014   Procedure: ESOPHAGOGASTRODUODENOSCOPY (EGD);  Surgeon: Rogene Houston, MD;  [facility];  Service: Endoscopy;  Laterality: N/A;  730   EYE SURGERY     both cataracts   MALONEY DILATION N/A 06/02/2014   Procedure: MALONEY DILATION;  Surgeon: Rogene Houston, MD;  [facility];  Service: Endoscopy;  Laterality: N/A;   MASTECTOMY  2001   Left;modified radical with lymph node dissection   PORT-A-CATH REMOVAL     pac in and out 2002    FAMILY HISTORY Family History  Problem Relation Age of Onset   Cancer Sister        lung   Cancer Brother        lipoma   Cancer Brother        kidney    SOCIAL HISTORY Social History   Tobacco Use   Smoking status: Never   Smokeless tobacco: Never  Vaping Use   Vaping Use: Never used  Substance Use Topics   Alcohol use: No    Alcohol/week: 0.0 standard drinks   Drug use: No         OPHTHALMIC EXAM:  Base Eye Exam     Visual Acuity (ETDRS)       Right Left   Dist [place] 20/70 -1 20/60   Dist ph  NI 20/50         Tonometry (Tonopen, 2:51 PM)       Right Left   Pressure 22 18         Pupils       Pupils Dark Light Shape React APD   Right PERRL 5 4 Round Slow None   Left PERRL 5 4  Round Slow None         Visual Fields (Counting fingers)       Left Right    Full Full         Extraocular Movement  Right Left    Full Full         Neuro/Psych     Oriented x3: Yes   Mood/Affect: Normal         Dilation     Left eye:            Slit Lamp and Fundus Exam     External Exam       Right Left   External Normal Normal         Slit Lamp Exam       Right Left   Lids/Lashes Normal Normal   Conjunctiva/Sclera White and quiet White and quiet   Cornea Clear Clear   Anterior Chamber Deep and quiet Deep and quiet   Iris Round and reactive Round and reactive   Lens Posterior chamber intraocular lens, Open posterior capsule Posterior chamber intraocular lens   Anterior Vitreous Normal Normal         Fundus Exam       Right Left   Posterior Vitreous clear , vitrectomized Posterior vitreous detachment   Disc Normal Normal   C/D Ratio 0.35 0.3   Macula Geographic atrophy, Intermediate age related macular degeneration Disciform scar, [place], no exudates, no hemorrhage, no macular thickening, Mottling, Retinal pigment epithelial mottling, Atrophy, Retinal pigment epithelial atrophy, Age related macular degeneration, Early age related macular degeneration, Geographic atrophy near the FAZ, Pigmented atrophy, Hard drusen,   Vessels Normal Normal   Periphery Normal, no holes Normal, no holes            IMAGING AND PROCEDURES  Imaging and Procedures for 04/05/21  OCT, Retina - OU - Both Eyes       Right Eye Quality was borderline. Scan locations included subfoveal. Central Foveal Thickness: 336. Progression has been stable. Findings include abnormal foveal contour, pigment epithelial detachment.   Left Eye Quality was good. Scan locations included subfoveal. Central Foveal Thickness: 251. Progression has been stable. Findings include abnormal foveal contour.   Notes Much less overall thickening and subretinal fluid and edema and  intraretinal fluid post injection Avastin.  OD, only 2 weeks post most recent injection  OS, 6 week post injection still improving, yet with intraretinal fluid Temporal to the macula,      Intravitreal Injection, Pharmacologic Agent - OS - Left Eye       Time Out 04/05/2021. 3:40 PM. Confirmed correct patient, procedure, site, and patient consented.   Anesthesia Topical anesthesia was used. Anesthetic medications included [place] 3.5%.   Procedure Preparation included Ofloxacin , Tobramycin 0.3%, 10% betadine to eyelids, 5% betadine to ocular surface. A supplied needle was used.   Injection: 2.5 mg bevacizumab 2.5 MG/0.1ML   Route: Intravitreal, Site: Left Eye   NDC: [phone], Lot: [id number]   Post-op Post injection exam found visual acuity of at least counting fingers. The patient tolerated the procedure well. There were no complications. The patient received written and verbal post procedure care education. Post injection medications were not given.              ASSESSMENT/PLAN:  Exudative age-related macular degeneration of left eye with active choroidal neovascularization (HCC) Overall improved at shorter interval of 6 weeks as.  We will repeat injection today and maintain 6-week follow-up  Exudative age-related macular degeneration of right eye with active choroidal neovascularization (HCC) Today at 2.5 weeks postinjection stable yet still active follow-up as scheduled OD     ICD-10-CM   1. Exudative age-related macular degeneration of left eye with active  choroidal neovascularization (HCC)  H35.3221 OCT, Retina - OU - Both Eyes    Intravitreal Injection, Pharmacologic Agent - OS - Left Eye    bevacizumab (AVASTIN) SOSY 2.5 mg    2. Exudative age-related macular degeneration of right eye with active choroidal neovascularization ([place])  H35.3211       1.  OD at 2.5 weeks post most recent injection with subfoveal disciform type scar and permanent geographic atrophy  secondary to this condition yet no enlargement of scotoma obvious follow-up OD as scheduled  2.  OS today much less active disease temporal to the FAZ at shorter interval 6-week follow-up.  Repeat injection Avastin today follow-up next again in 6 weeks OS  3.  Ophthalmic Meds Ordered this visit:  Meds ordered this encounter  Medications   bevacizumab (AVASTIN) SOSY 2.5 mg       Return in about 6 weeks (around 05/17/2021) for dilate, OS, AVASTIN OCT,, and follow-up OD as scheduled.  There are no Patient Instructions on file for this visit.   Explained the diagnoses, plan, and follow up with the patient and they expressed understanding.  Patient expressed understanding of the importance of proper follow up care.   Clent Demark Josua Ferrebee M.D. Diseases & Surgery of the Retina and Vitreous [facility] [place] 04/05/21     Abbreviations: M myopia (nearsighted); A astigmatism; H hyperopia (farsighted); P presbyopia; Mrx spectacle prescription;  CTL contact lenses; OD right eye; OS left eye; OU both eyes  XT exotropia; ET esotropia; PEK punctate epithelial keratitis; PEE punctate epithelial erosions; DES dry eye syndrome; MGD meibomian gland dysfunction; ATs artificial tears; PFAT's preservative free artificial tears; [place] nuclear sclerotic cataract; PSC posterior subcapsular cataract; ERM epi-retinal membrane; PVD posterior vitreous detachment; RD retinal detachment; DM diabetes mellitus; DR diabetic retinopathy; NPDR non-proliferative diabetic retinopathy; PDR proliferative diabetic retinopathy; CSME clinically significant macular edema; DME diabetic macular edema; dbh dot blot hemorrhages; CWS cotton wool spot; POAG primary open angle glaucoma; C/D cup-to-disc ratio; HVF humphrey visual field; GVF goldmann visual field; OCT optical coherence tomography; IOP intraocular pressure; BRVO Branch retinal vein occlusion; CRVO central retinal vein occlusion; CRAO central retinal artery occlusion;  BRAO branch retinal artery occlusion; RT retinal tear; SB scleral buckle; PPV pars plana vitrectomy; VH Vitreous hemorrhage; PRP panretinal laser photocoagulation; IVK intravitreal kenalog; VMT vitreomacular traction; MH Macular hole;  NVD neovascularization of the disc; NVE neovascularization elsewhere; AREDS age related eye disease study; ARMD age related macular degeneration; POAG primary open angle glaucoma; EBMD epithelial/anterior basement membrane dystrophy; ACIOL anterior chamber intraocular lens; IOL intraocular lens; PCIOL posterior chamber intraocular lens; Phaco/IOL phacoemulsification with intraocular lens placement; [place] photorefractive keratectomy; LASIK laser assisted in situ keratomileusis; HTN hypertension; DM diabetes mellitus; COPD chronic obstructive pulmonary disease

## 2021-04-05 NOTE — Assessment & Plan Note (Signed)
Overall improved at shorter interval of 6 weeks as.  We will repeat injection today and maintain 6-week follow-up

## 2021-04-07 DIAGNOSIS — H40002 Preglaucoma, unspecified, left eye: Secondary | ICD-10-CM | POA: Diagnosis not present

## 2021-04-07 DIAGNOSIS — H401112 Primary open-angle glaucoma, right eye, moderate stage: Secondary | ICD-10-CM | POA: Diagnosis not present

## 2021-04-21 ENCOUNTER — Encounter (INDEPENDENT_AMBULATORY_CARE_PROVIDER_SITE_OTHER): Payer: PPO | Admitting: Ophthalmology

## 2021-04-21 ENCOUNTER — Ambulatory Visit (INDEPENDENT_AMBULATORY_CARE_PROVIDER_SITE_OTHER): Payer: PPO | Admitting: Ophthalmology

## 2021-04-21 ENCOUNTER — Other Ambulatory Visit: Payer: Self-pay

## 2021-04-21 ENCOUNTER — Encounter (INDEPENDENT_AMBULATORY_CARE_PROVIDER_SITE_OTHER): Payer: Self-pay | Admitting: Ophthalmology

## 2021-04-21 DIAGNOSIS — H353221 Exudative age-related macular degeneration, left eye, with active choroidal neovascularization: Secondary | ICD-10-CM | POA: Diagnosis not present

## 2021-04-21 DIAGNOSIS — H35721 Serous detachment of retinal pigment epithelium, right eye: Secondary | ICD-10-CM | POA: Diagnosis not present

## 2021-04-21 DIAGNOSIS — H353211 Exudative age-related macular degeneration, right eye, with active choroidal neovascularization: Secondary | ICD-10-CM | POA: Diagnosis not present

## 2021-04-21 MED ORDER — BEVACIZUMAB 2.5 MG/0.1ML IZ SOSY
2.5000 mg | PREFILLED_SYRINGE | INTRAVITREAL | Status: AC | PRN
  Administered 2021-04-21: 2.5 mg via INTRAVITREAL

## 2021-04-21 NOTE — Assessment & Plan Note (Signed)
Component of wet AMD with an RPE rip

## 2021-04-21 NOTE — Assessment & Plan Note (Addendum)
OD with active CNVM and less active with with less hemorrhage as well perifoveal OD currently at 5-week follow-up interval

## 2021-04-21 NOTE — Assessment & Plan Note (Signed)
OS also less active at 2 weeks post follow-up follow-up as scheduled OD as next

## 2021-04-21 NOTE — Progress Notes (Signed)
04/21/2021     CHIEF COMPLAINT Patient presents for  Chief Complaint  Patient presents with   Retina Follow Up      HISTORY OF PRESENT ILLNESS: [name] is a 87 y.o. female who presents to the clinic today for:   HPI     Retina Follow Up   Patient presents with  Wet AMD.  In right eye.  This started 5 weeks ago.  Severity is mild.  Duration of 5 weeks.  Since onset it is stable.        Comments   5 week fu OD oct avastin OD. Patient states vision is stable and unchanged since last visit. Denies any new floaters or FOL. Pt is using Rhopressa qhs OD, Vyzulta qhs OU, dorz- tim bid OD.      Last edited by Laurin Coder on 04/21/2021  2:08 PM.      Referring physician: Asencion Noble, MD [address] [place],  [place] [address]  HISTORICAL INFORMATION:   Selected notes from the MEDICAL RECORD NUMBER       CURRENT MEDICATIONS: Current Outpatient Medications (Ophthalmic Drugs)  Medication Sig   dorzolamide-timolol (COSOPT) 22.3-6.8 MG/ML ophthalmic solution Place 1 drop into the right eye 2 (two) times daily.    Latanoprostene Bunod 0.024 % SOLN Place 1 drop into the right eye at bedtime.    No current facility-administered medications for this visit. (Ophthalmic Drugs)   Current Outpatient Medications (Other)  Medication Sig   acetaminophen (TYLENOL) 500 MG tablet Take 500 mg by mouth every 6 (six) hours as needed. Patient reports that she may take 1 or 2 times a day for hip pain.   albuterol (VENTOLIN HFA) 108 (90 Base) MCG/ACT inhaler Inhale 2 puffs into the lungs every 4 (four) hours as needed.   carvedilol (COREG) 12.5 MG tablet Take 12.5 mg by mouth 2 (two) times daily with a meal.    ELIQUIS 5 MG TABS tablet TAKE (1) TABLET BY MOUTH TWICE DAILY FOR BLOOD THINNER.   furosemide (LASIX) 40 MG tablet TAKE ONE TABLET BY MOUTH ONCE DAILY.   losartan (COZAAR) 25 MG tablet Take 1 tablet (25 mg total) by mouth 2 (two) times daily.   Multiple  Vitamins-Minerals (VISION FORMULA EYE HEALTH PO) Take 1 tablet by mouth 2 (two) times daily.    pantoprazole (PROTONIX) 40 MG tablet Take 1 tablet (40 mg total) by mouth daily before breakfast.   pramoxine-hydrocortisone (PROCTOCREAM-HC) 1-1 % rectal cream Place 1 application rectally 2 (two) times daily as needed for hemorrhoids or anal itching.   No current facility-administered medications for this visit. (Other)      REVIEW OF SYSTEMS: ROS   Negative for: Constitutional, Gastrointestinal, Neurological, Skin, Genitourinary, Musculoskeletal, HENT, Endocrine, Cardiovascular, Eyes, Respiratory, Psychiatric, Allergic/Imm, Heme/Lymph Last edited by Hurman Horn, MD on 04/21/2021  2:20 PM.       ALLERGIES Allergies  Allergen Reactions   Atorvastatin Other (See Comments)    Myalgias   Hydrocodone Other (See Comments)    GI distress.    PAST MEDICAL HISTORY Past Medical History:  Diagnosis Date   Anxiety    Arteriosclerotic cardiovascular disease (ASCVD)     cath Feb '07- no obstructive dz - 20% proximal LAD only; EF 65%; possible coronary artery spasm   Atrial fibrillation (HCC)    Paroxysmal on event recorder in 2009; regular supraventricular tachycardia at a rate of 150 also identified on that study representing either atrial flutter or PSVT; onset  of persistent AF in 03/2011   Carcinoma of breast [facility]) 2001   Left mastectomy with positive nodes   Chest pain    Long-standing and atypical   Chronic anticoagulation 04/12/2011   GERD (gastroesophageal reflux disease)    hiatal hernia   Herpes zoster    Hyperlipidemia    Lipid profile in 11/2008:282, 176, 64, 201   Hypertension    diastolic dysfunction; normal CMet and TSH in 11/2009; normal CBC in 04/2010   Malignant neoplasm of breast (female), unspecified site 05/27/2013   right breast lumpectomy with positive node   Peripheral neuropathy    [place] 1946   at age 21   Rectal bleed    Renal insufficiency    Shortness of  breath dyspnea    Vitreous hemorrhage of right eye ([place]) 10/23/2019   Past Surgical History:  Procedure Laterality Date   BREAST LUMPECTOMY WITH NEEDLE LOCALIZATION AND AXILLARY SENTINEL LYMPH NODE BX Right 05/27/2013   Procedure: BREAST LUMPECTOMY WITH NEEDLE LOCALIZATION AND AXILLARY SENTINEL LYMPH NODE BX;  Surgeon: Pedro Earls, MD;  Location: [facility];  Service: General;  Laterality: Right;   [place]   COLONOSCOPY  02/22/2011   Procedure: COLONOSCOPY;  Surgeon: Rogene Houston, MD;  [facility];  Service: Endoscopy;  Laterality: N/A;; performed for scant hematochezia   DILATION AND CURETTAGE OF UTERUS     ESOPHAGOGASTRODUODENOSCOPY  02/22/2011   Procedure: ESOPHAGOGASTRODUODENOSCOPY (EGD);  Surgeon: Rogene Houston, MD;  [facility];  Service: Endoscopy;  Laterality: N/A;   ESOPHAGOGASTRODUODENOSCOPY N/A 06/02/2014   Procedure: ESOPHAGOGASTRODUODENOSCOPY (EGD);  Surgeon: Rogene Houston, MD;  [facility];  Service: Endoscopy;  Laterality: N/A;  730   EYE SURGERY     both cataracts   MALONEY DILATION N/A 06/02/2014   Procedure: MALONEY DILATION;  Surgeon: Rogene Houston, MD;  [facility];  Service: Endoscopy;  Laterality: N/A;   MASTECTOMY  2001   Left;modified radical with lymph node dissection   PORT-A-CATH REMOVAL     pac in and out 2002    FAMILY HISTORY Family History  Problem Relation Age of Onset   Cancer Sister        lung   Cancer Brother        lipoma   Cancer Brother        kidney    SOCIAL HISTORY Social History   Tobacco Use   Smoking status: Never   Smokeless tobacco: Never  Vaping Use   Vaping Use: Never used  Substance Use Topics   Alcohol use: No    Alcohol/week: 0.0 standard drinks   Drug use: No         OPHTHALMIC EXAM:  Base Eye Exam     Visual Acuity (ETDRS)       Right Left   Dist [place] 20/80 -1+2 20/70 -1   Dist ph [place] NI NI          Tonometry (Tonopen, 1:59 PM)       Right Left   Pressure 15 12         Pupils       Pupils Dark Light Shape React APD   Right PERRL 5 4 Round Slow None   Left PERRL 5 4 Round Brisk None         Visual Fields (Counting fingers)       Left Right    Full Full  Extraocular Movement       Right Left    Full Full         Neuro/Psych     Oriented x3: Yes   Mood/Affect: Normal         Dilation     Right eye: 1.0% Mydriacyl, 2.5% Phenylephrine @ 1:59 PM           Slit Lamp and Fundus Exam     External Exam       Right Left   External Normal Normal         Slit Lamp Exam       Right Left   Lids/Lashes Normal Normal   Conjunctiva/Sclera White and quiet White and quiet   Cornea Clear Clear   Anterior Chamber Deep and quiet Deep and quiet   Iris Round and reactive Round and reactive   Lens Posterior chamber intraocular lens, Open posterior capsule Posterior chamber intraocular lens   Anterior Vitreous Normal Normal         Fundus Exam       Right Left   Posterior Vitreous clear , vitrectomized    Disc Normal    C/D Ratio 0.35    Macula Geographic atrophy, Intermediate age related macular degeneration, Subretinal hemorrhage, paramacular    Vessels Normal    Periphery Normal, no holes             IMAGING AND PROCEDURES  Imaging and Procedures for 04/21/21  OCT, Retina - OU - Both Eyes       Right Eye Quality was borderline. Scan locations included subfoveal. Central Foveal Thickness: 394. Progression has been stable. Findings include abnormal foveal contour, pigment epithelial detachment.   Left Eye Quality was good. Scan locations included subfoveal. Central Foveal Thickness: 308. Progression has been stable. Findings include abnormal foveal contour.   Notes Much less overall thickening and subretinal fluid and edema and intraretinal fluid post injection Avastin.  OD, only 5 weeks post most recent injection, slightly  less active  OS, 2 week post injection still improving, yet with intraretinal fluid Temporal to the macula,      Intravitreal Injection, Pharmacologic Agent - OD - Right Eye       Time Out 04/21/2021. 2:19 PM. Confirmed correct patient, procedure, site, and patient consented.   Anesthesia Topical anesthesia was used. Anesthetic medications included [place] 3.5%.   Procedure Preparation included Tobramycin 0.3%, 10% betadine to eyelids, Ofloxacin , 5% betadine to ocular surface. A 30 gauge needle was used.   Injection: 2.5 mg bevacizumab 2.5 MG/0.1ML   Route: Intravitreal, Site: Right Eye   NDC: [phone]   Post-op Post injection exam found visual acuity of at least counting fingers. The patient tolerated the procedure well. There were no complications. The patient received written and verbal post procedure care education. Post injection medications included ocuflox.              ASSESSMENT/PLAN:  Exudative age-related macular degeneration of right eye with active choroidal neovascularization (HCC) OD with active CNVM and less active with with less hemorrhage as well perifoveal OD currently at 5-week follow-up interval  Exudative age-related macular degeneration of left eye with active choroidal neovascularization (HCC) OS also less active at 2 weeks post follow-up follow-up as scheduled OD as next  Macular pigment epithelial detachment, right Component of wet AMD with an RPE rip      ICD-10-CM   1. Exudative age-related macular degeneration of right eye with active choroidal neovascularization ([place])  H35.3211  OCT, Retina - OU - Both Eyes    Intravitreal Injection, Pharmacologic Agent - OD - Right Eye    bevacizumab (AVASTIN) SOSY 2.5 mg    2. Exudative age-related macular degeneration of left eye with active choroidal neovascularization ([place])  H35.3221     3. Macular pigment epithelial detachment, right  H35.721       1.  OD vastly improved yet still active  CNVM with small amount of subretinal hemorrhage adjacent to fovea.  Preserved acuity.  Repeat injection today maintain 5-week follow-up interval OD  2.  Dilate OS and follow-up as scheduled  3.  Ophthalmic Meds Ordered this visit:  Meds ordered this encounter  Medications   bevacizumab (AVASTIN) SOSY 2.5 mg       Return in about 5 weeks (around 05/26/2021) for dilate, OD, AVASTIN OCT,, and follow-up OS as scheduled.  There are no Patient Instructions on file for this visit.   Explained the diagnoses, plan, and follow up with the patient and they expressed understanding.  Patient expressed understanding of the importance of proper follow up care.   Clent Demark Estefani Bateson M.D. Diseases & Surgery of the Retina and Vitreous [facility]ina [facility] [place] 04/21/21     Abbreviations: M myopia (nearsighted); A astigmatism; H hyperopia (farsighted); P presbyopia; Mrx spectacle prescription;  CTL contact lenses; OD right eye; OS left eye; OU both eyes  XT exotropia; ET esotropia; PEK punctate epithelial keratitis; PEE punctate epithelial erosions; DES dry eye syndrome; MGD meibomian gland dysfunction; ATs artificial tears; PFAT's preservative free artificial tears; [place] nuclear sclerotic cataract; PSC posterior subcapsular cataract; ERM epi-retinal membrane; PVD posterior vitreous detachment; RD retinal detachment; DM diabetes mellitus; DR diabetic retinopathy; NPDR non-proliferative diabetic retinopathy; PDR proliferative diabetic retinopathy; CSME clinically significant macular edema; DME diabetic macular edema; dbh dot blot hemorrhages; CWS cotton wool spot; POAG primary open angle glaucoma; C/D cup-to-disc ratio; HVF humphrey visual field; GVF goldmann visual field; OCT optical coherence tomography; IOP intraocular pressure; BRVO Branch retinal vein occlusion; CRVO central retinal vein occlusion; CRAO central retinal artery occlusion; BRAO branch retinal artery occlusion; RT retinal tear; SB scleral  buckle; PPV pars plana vitrectomy; VH Vitreous hemorrhage; PRP panretinal laser photocoagulation; IVK intravitreal kenalog; VMT vitreomacular traction; MH Macular hole;  NVD neovascularization of the disc; NVE neovascularization elsewhere; AREDS age related eye disease study; ARMD age related macular degeneration; POAG primary open angle glaucoma; EBMD epithelial/anterior basement membrane dystrophy; ACIOL anterior chamber intraocular lens; IOL intraocular lens; PCIOL posterior chamber intraocular lens; Phaco/IOL phacoemulsification with intraocular lens placement; [place] photorefractive keratectomy; LASIK laser assisted in situ keratomileusis; HTN hypertension; DM diabetes mellitus; COPD chronic obstructive pulmonary disease

## 2021-05-17 ENCOUNTER — Encounter (INDEPENDENT_AMBULATORY_CARE_PROVIDER_SITE_OTHER): Payer: PPO | Admitting: Ophthalmology

## 2021-05-17 DIAGNOSIS — H401112 Primary open-angle glaucoma, right eye, moderate stage: Secondary | ICD-10-CM | POA: Diagnosis not present

## 2021-05-17 DIAGNOSIS — H40002 Preglaucoma, unspecified, left eye: Secondary | ICD-10-CM | POA: Diagnosis not present

## 2021-05-19 ENCOUNTER — Ambulatory Visit (INDEPENDENT_AMBULATORY_CARE_PROVIDER_SITE_OTHER): Payer: PPO | Admitting: Ophthalmology

## 2021-05-19 ENCOUNTER — Encounter (INDEPENDENT_AMBULATORY_CARE_PROVIDER_SITE_OTHER): Payer: Self-pay | Admitting: Ophthalmology

## 2021-05-19 ENCOUNTER — Encounter (INDEPENDENT_AMBULATORY_CARE_PROVIDER_SITE_OTHER): Payer: PPO | Admitting: Ophthalmology

## 2021-05-19 ENCOUNTER — Other Ambulatory Visit: Payer: Self-pay

## 2021-05-19 DIAGNOSIS — H353211 Exudative age-related macular degeneration, right eye, with active choroidal neovascularization: Secondary | ICD-10-CM

## 2021-05-19 DIAGNOSIS — H353221 Exudative age-related macular degeneration, left eye, with active choroidal neovascularization: Secondary | ICD-10-CM

## 2021-05-19 MED ORDER — BEVACIZUMAB 2.5 MG/0.1ML IZ SOSY
2.5000 mg | PREFILLED_SYRINGE | INTRAVITREAL | Status: AC | PRN
  Administered 2021-05-19: 2.5 mg via INTRAVITREAL

## 2021-05-19 NOTE — Progress Notes (Signed)
05/19/2021     CHIEF COMPLAINT Patient presents for  Chief Complaint  Patient presents with   Retina Follow Up      HISTORY OF PRESENT ILLNESS: [name] is a 87 y.o. female who presents to the clinic today for:   HPI     Retina Follow Up   Patient presents with  Wet AMD.  In right eye.  This started 6 weeks ago.  Severity is mild.  Duration of 6 weeks.  Since onset it is stable.        Comments   6 week and 2 days fu OS oct avastin OS. Patient states vision is stable and unchanged since last visit. Denies any new floaters or FOL. Pt states "I am having laser surgery on Dec. 5th for glaucoma I think, by a doctor at [facility]. He is taking Dr Trena Platt place." Pt states "I use a white cap in the right eye at bedtime, and one in the morning and at dinner time in the right eye." Pt does not know the names.       Last edited by Laurin Coder on 05/19/2021  3:11 PM.      Referring physician: Asencion Noble, MD [address] [place],  [place] [address]  HISTORICAL INFORMATION:   Selected notes from the MEDICAL RECORD NUMBER       CURRENT MEDICATIONS: Current Outpatient Medications (Ophthalmic Drugs)  Medication Sig   dorzolamide-timolol (COSOPT) 22.3-6.8 MG/ML ophthalmic solution Place 1 drop into the right eye 2 (two) times daily.    Latanoprostene Bunod 0.024 % SOLN Place 1 drop into the right eye at bedtime.    No current facility-administered medications for this visit. (Ophthalmic Drugs)   Current Outpatient Medications (Other)  Medication Sig   acetaminophen (TYLENOL) 500 MG tablet Take 500 mg by mouth every 6 (six) hours as needed. Patient reports that she may take 1 or 2 times a day for hip pain.   albuterol (VENTOLIN HFA) 108 (90 Base) MCG/ACT inhaler Inhale 2 puffs into the lungs every 4 (four) hours as needed.   carvedilol (COREG) 12.5 MG tablet Take 12.5 mg by mouth 2 (two) times daily with a meal.    ELIQUIS 5 MG TABS tablet TAKE (1)  TABLET BY MOUTH TWICE DAILY FOR BLOOD THINNER.   furosemide (LASIX) 40 MG tablet TAKE ONE TABLET BY MOUTH ONCE DAILY.   losartan (COZAAR) 25 MG tablet Take 1 tablet (25 mg total) by mouth 2 (two) times daily.   Multiple Vitamins-Minerals (VISION FORMULA EYE HEALTH PO) Take 1 tablet by mouth 2 (two) times daily.    pantoprazole (PROTONIX) 40 MG tablet Take 1 tablet (40 mg total) by mouth daily before breakfast.   pramoxine-hydrocortisone (PROCTOCREAM-HC) 1-1 % rectal cream Place 1 application rectally 2 (two) times daily as needed for hemorrhoids or anal itching.   No current facility-administered medications for this visit. (Other)      REVIEW OF SYSTEMS:    ALLERGIES Allergies  Allergen Reactions   Atorvastatin Other (See Comments)    Myalgias   Hydrocodone Other (See Comments)    GI distress.    PAST MEDICAL HISTORY Past Medical History:  Diagnosis Date   Anxiety    Arteriosclerotic cardiovascular disease (ASCVD)     cath Feb '07- no obstructive dz - 20% proximal LAD only; EF 65%; possible coronary artery spasm   Atrial fibrillation (HCC)    Paroxysmal on event recorder in 2009; regular supraventricular tachycardia at a  rate of 150 also identified on that study representing either atrial flutter or PSVT; onset of persistent AF in 03/2011   Carcinoma of breast ([place]) 2001   Left mastectomy with positive nodes   Chest pain    Long-standing and atypical   Chronic anticoagulation 04/12/2011   GERD (gastroesophageal reflux disease)    hiatal hernia   Herpes zoster    Hyperlipidemia    Lipid profile in 11/2008:282, 176, 64, 201   Hypertension    diastolic dysfunction; normal CMet and TSH in 11/2009; normal CBC in 04/2010   Malignant neoplasm of breast (female), unspecified site 05/27/2013   right breast lumpectomy with positive node   Peripheral neuropathy    [place] 1946   at age 50   Rectal bleed    Renal insufficiency    Shortness of breath dyspnea    Vitreous hemorrhage  of right eye ([place]) 10/23/2019   Past Surgical History:  Procedure Laterality Date   BREAST LUMPECTOMY WITH NEEDLE LOCALIZATION AND AXILLARY SENTINEL LYMPH NODE BX Right 05/27/2013   Procedure: BREAST LUMPECTOMY WITH NEEDLE LOCALIZATION AND AXILLARY SENTINEL LYMPH NODE BX;  Surgeon: Pedro Earls, MD;  Location: [facility];  Service: General;  Laterality: Right;   [place]   COLONOSCOPY  02/22/2011   Procedure: COLONOSCOPY;  Surgeon: Rogene [place], MD;  [facility];  Service: Endoscopy;  Laterality: N/A;; performed for scant hematochezia   DILATION AND CURETTAGE OF UTERUS     ESOPHAGOGASTRODUODENOSCOPY  02/22/2011   Procedure: ESOPHAGOGASTRODUODENOSCOPY (EGD);  Surgeon: Rogene [place], MD;  [facility];  Service: Endoscopy;  Laterality: N/A;   ESOPHAGOGASTRODUODENOSCOPY N/A 06/02/2014   Procedure: ESOPHAGOGASTRODUODENOSCOPY (EGD);  Surgeon: Rogene [place], MD;  [facility];  Service: Endoscopy;  Laterality: N/A;  730   EYE SURGERY     both cataracts   MALONEY DILATION N/A 06/02/2014   Procedure: MALONEY DILATION;  Surgeon: Rogene [place], MD;  [facility];  Service: Endoscopy;  Laterality: N/A;   MASTECTOMY  2001   Left;modified radical with lymph node dissection   PORT-A-CATH REMOVAL     pac in and out 2002    FAMILY HISTORY Family History  Problem Relation Age of Onset   Cancer Sister        lung   Cancer Brother        lipoma   Cancer Brother        kidney    SOCIAL HISTORY Social History   Tobacco Use   Smoking status: Never   Smokeless tobacco: Never  Vaping Use   Vaping Use: Never used  Substance Use Topics   Alcohol use: No    Alcohol/week: 0.0 standard drinks   Drug use: No         OPHTHALMIC EXAM:  Base Eye Exam     Visual Acuity (ETDRS)       Right Left   Dist [place] CF at 5' 20/80 -1   Dist ph [place] 20/100 +2 20/40 -2         Tonometry (Tonopen, 3:16  PM)       Right Left   Pressure 12 11         Pupils       Pupils Dark Light Shape APD   Right PERRL 5 4 Round None   Left PERRL 5 4 Round None         Extraocular Movement  Right Left    Full Full         Neuro/Psych     Oriented x3: Yes   Mood/Affect: Normal         Dilation     Left eye: 1.0% Mydriacyl, 2.5% Phenylephrine @ 3:16 PM           Slit Lamp and Fundus Exam     External Exam       Right Left   External Normal Normal         Slit Lamp Exam       Right Left   Lids/Lashes Normal Normal   Conjunctiva/Sclera White and quiet White and quiet   Cornea Clear Clear   Anterior Chamber Deep and quiet Deep and quiet   Iris Round and reactive Round and reactive   Lens Posterior chamber intraocular lens, Open posterior capsule Posterior chamber intraocular lens   Anterior Vitreous Normal Normal         Fundus Exam       Right Left   Posterior Vitreous  Posterior vitreous detachment   Disc  Normal   C/D Ratio  0.3   Macula  Disciform scar, [place], no exudates, no hemorrhage, no macular thickening, Mottling, Retinal pigment epithelial mottling, Atrophy, Retinal pigment epithelial atrophy, Age related macular degeneration, Early age related macular degeneration, Geographic atrophy near the FAZ, Pigmented atrophy, Hard drusen,   Vessels  Normal   Periphery  Normal, no holes            IMAGING AND PROCEDURES  Imaging and Procedures for 05/19/21  OCT, Retina - OU - Both Eyes       Right Eye Quality was borderline. Scan locations included subfoveal. Central Foveal Thickness: 417. Progression has been stable. Findings include abnormal foveal contour, pigment epithelial detachment.   Left Eye Quality was good. Scan locations included subfoveal. Central Foveal Thickness: 249. Progression has been stable. Findings include abnormal foveal contour.   Notes Much less overall thickening and subretinal fluid and edema and intraretinal  fluid post injection Avastin.  OD, only 4 weeks post most recent injection, slightly less active  OS, 6 week post injection still improving, yet with intraretinal fluid Temporal to the macula,      Intravitreal Injection, Pharmacologic Agent - OS - Left Eye       Time Out 05/19/2021. 3:43 PM. Confirmed correct patient, procedure, site, and patient consented.   Anesthesia Topical anesthesia was used. Anesthetic medications included Lidocaine 4%.   Procedure Preparation included Ofloxacin , 10% betadine to eyelids, 5% betadine to ocular surface. A supplied needle was used.   Injection: 2.5 mg bevacizumab 2.5 MG/0.1ML   Route: Intravitreal, Site: Left Eye   NDC: [phone], Lot: [id number]   Post-op Post injection exam found visual acuity of at least counting fingers. The patient tolerated the procedure well. There were no complications. The patient received written and verbal post procedure care education. Post injection medications included ocuflox.              ASSESSMENT/PLAN:  Exudative age-related macular degeneration of left eye with active choroidal neovascularization (HCC) OS, vastly improved with less intraretinal fluid yet still active at 6-week interval.  Repeat injection intravitreal Avastin today  Exudative age-related macular degeneration of right eye with active choroidal neovascularization (HCC) OD, improved at 4-week interval.  Follow-up as scheduled     ICD-10-CM   1. Exudative age-related macular degeneration of left eye with active choroidal neovascularization ([place])  H35.3221 OCT, Retina -  OU - Both Eyes    Intravitreal Injection, Pharmacologic Agent - OS - Left Eye    bevacizumab (AVASTIN) SOSY 2.5 mg    2. Exudative age-related macular degeneration of right eye with active choroidal neovascularization ([place])  H35.3211       1.  OS stabilized and improved on intravitreal Avastin currently at 6-week interval.  Repeated today and again examination left  eye in 6 weeks  2.  OD at 4 weeks follow-up today follow-up as scheduled  3.  Ophthalmic Meds Ordered this visit:  Meds ordered this encounter  Medications   bevacizumab (AVASTIN) SOSY 2.5 mg       Return in about 6 weeks (around 06/30/2021) for dilate, OS, AVASTIN OCT,, and follow-up OD as scheduled.  There are no Patient Instructions on file for this visit.   Explained the diagnoses, plan, and follow up with the patient and they expressed understanding.  Patient expressed understanding of the importance of proper follow up care.   Clent Demark Samarah Hogle M.D. Diseases & Surgery of the Retina and Vitreous Retina & Diabetic [place] 05/19/21     Abbreviations: M myopia (nearsighted); A astigmatism; H hyperopia (farsighted); P presbyopia; Mrx spectacle prescription;  CTL contact lenses; OD right eye; OS left eye; OU both eyes  XT exotropia; ET esotropia; PEK punctate epithelial keratitis; PEE punctate epithelial erosions; DES dry eye syndrome; MGD meibomian gland dysfunction; ATs artificial tears; PFAT's preservative free artificial tears; [place] nuclear sclerotic cataract; PSC posterior subcapsular cataract; ERM epi-retinal membrane; PVD posterior vitreous detachment; RD retinal detachment; DM diabetes mellitus; DR diabetic retinopathy; NPDR non-proliferative diabetic retinopathy; PDR proliferative diabetic retinopathy; CSME clinically significant macular edema; DME diabetic macular edema; dbh dot blot hemorrhages; CWS cotton wool spot; POAG primary open angle glaucoma; C/D cup-to-disc ratio; HVF humphrey visual field; GVF goldmann visual field; OCT optical coherence tomography; IOP intraocular pressure; BRVO Branch retinal vein occlusion; CRVO central retinal vein occlusion; CRAO central retinal artery occlusion; BRAO branch retinal artery occlusion; RT retinal tear; SB scleral buckle; PPV pars plana vitrectomy; VH Vitreous hemorrhage; PRP panretinal laser photocoagulation; IVK intravitreal  kenalog; VMT vitreomacular traction; MH Macular hole;  NVD neovascularization of the disc; NVE neovascularization elsewhere; AREDS age related eye disease study; ARMD age related macular degeneration; POAG primary open angle glaucoma; EBMD epithelial/anterior basement membrane dystrophy; ACIOL anterior chamber intraocular lens; IOL intraocular lens; PCIOL posterior chamber intraocular lens; Phaco/IOL phacoemulsification with intraocular lens placement; [place] photorefractive keratectomy; LASIK laser assisted in situ keratomileusis; HTN hypertension; DM diabetes mellitus; COPD chronic obstructive pulmonary disease

## 2021-05-19 NOTE — Assessment & Plan Note (Signed)
OD, improved at 4-week interval.  Follow-up as scheduled

## 2021-05-19 NOTE — Assessment & Plan Note (Signed)
OS, vastly improved with less intraretinal fluid yet still active at 6-week interval.  Repeat injection intravitreal Avastin today

## 2021-05-20 ENCOUNTER — Other Ambulatory Visit (INDEPENDENT_AMBULATORY_CARE_PROVIDER_SITE_OTHER): Payer: Self-pay | Admitting: Internal Medicine

## 2021-05-23 NOTE — Telephone Encounter (Signed)
07/13/2020 [address] Laural Golden

## 2021-05-26 ENCOUNTER — Other Ambulatory Visit: Payer: Self-pay

## 2021-05-26 ENCOUNTER — Ambulatory Visit (INDEPENDENT_AMBULATORY_CARE_PROVIDER_SITE_OTHER): Payer: PPO | Admitting: Ophthalmology

## 2021-05-26 ENCOUNTER — Encounter (INDEPENDENT_AMBULATORY_CARE_PROVIDER_SITE_OTHER): Payer: Self-pay | Admitting: Ophthalmology

## 2021-05-26 DIAGNOSIS — I4821 Permanent atrial fibrillation: Secondary | ICD-10-CM | POA: Diagnosis not present

## 2021-05-26 DIAGNOSIS — H353221 Exudative age-related macular degeneration, left eye, with active choroidal neovascularization: Secondary | ICD-10-CM | POA: Diagnosis not present

## 2021-05-26 DIAGNOSIS — I5032 Chronic diastolic (congestive) heart failure: Secondary | ICD-10-CM | POA: Diagnosis not present

## 2021-05-26 DIAGNOSIS — H353211 Exudative age-related macular degeneration, right eye, with active choroidal neovascularization: Secondary | ICD-10-CM | POA: Diagnosis not present

## 2021-05-26 DIAGNOSIS — Z79899 Other long term (current) drug therapy: Secondary | ICD-10-CM | POA: Diagnosis not present

## 2021-05-26 DIAGNOSIS — R7303 Prediabetes: Secondary | ICD-10-CM | POA: Diagnosis not present

## 2021-05-26 MED ORDER — BEVACIZUMAB 2.5 MG/0.1ML IZ SOSY
2.5000 mg | PREFILLED_SYRINGE | INTRAVITREAL | Status: AC | PRN
  Administered 2021-05-26: 15:00:00 2.5 mg via INTRAVITREAL

## 2021-05-26 NOTE — Assessment & Plan Note (Signed)
OS 1 week post most recent injection follow-up as scheduled

## 2021-05-26 NOTE — Assessment & Plan Note (Signed)
OD, with clinically present hemorrhage temporal portion of FAZ, currently at 5-week follow-up interval.  Repeat injection today and follow-up again in 5 weeks

## 2021-05-26 NOTE — Progress Notes (Signed)
05/26/2021     CHIEF COMPLAINT Patient presents for  Chief Complaint  Patient presents with   Retina Follow Up      HISTORY OF PRESENT ILLNESS: [name] is a 87 y.o. female who presents to the clinic today for:   HPI     Retina Follow Up   Patient presents with  Wet AMD.  In right eye.  This started 5 weeks ago.  Severity is mild.  Duration of 5 weeks.  Since onset it is stable.        Comments   5 week fu OD oct avastin OD. Patient states vision is stable and unchanged since last visit. Denies any new floaters or FOL. Pt uses dorz-tim qhs OU.      Last edited by Laurin Coder on 05/26/2021  2:24 PM.      Referring physician: Asencion Noble, MD [address] [place],  [place] [address]  HISTORICAL INFORMATION:   Selected notes from the MEDICAL RECORD NUMBER       CURRENT MEDICATIONS: Current Outpatient Medications (Ophthalmic Drugs)  Medication Sig   dorzolamide-timolol (COSOPT) 22.3-6.8 MG/ML ophthalmic solution Place 1 drop into the right eye 2 (two) times daily.    Latanoprostene Bunod 0.024 % SOLN Place 1 drop into the right eye at bedtime.    No current facility-administered medications for this visit. (Ophthalmic Drugs)   Current Outpatient Medications (Other)  Medication Sig   acetaminophen (TYLENOL) 500 MG tablet Take 500 mg by mouth every 6 (six) hours as needed. Patient reports that she may take 1 or 2 times a day for hip pain.   albuterol (VENTOLIN HFA) 108 (90 Base) MCG/ACT inhaler Inhale 2 puffs into the lungs every 4 (four) hours as needed.   carvedilol (COREG) 12.5 MG tablet Take 12.5 mg by mouth 2 (two) times daily with a meal.    ELIQUIS 5 MG TABS tablet TAKE (1) TABLET BY MOUTH TWICE DAILY FOR BLOOD THINNER.   furosemide (LASIX) 40 MG tablet TAKE ONE TABLET BY MOUTH ONCE DAILY.   losartan (COZAAR) 25 MG tablet Take 1 tablet (25 mg total) by mouth 2 (two) times daily.   Multiple Vitamins-Minerals (VISION FORMULA EYE HEALTH PO)  Take 1 tablet by mouth 2 (two) times daily.    pantoprazole (PROTONIX) 40 MG tablet TAKE ONE TABLET BY MOUTH DAILY BEFORE BREAKFAST FOR ACID REFLUX.   pramoxine-hydrocortisone (PROCTOCREAM-HC) 1-1 % rectal cream Place 1 application rectally 2 (two) times daily as needed for hemorrhoids or anal itching.   No current facility-administered medications for this visit. (Other)      REVIEW OF SYSTEMS:    ALLERGIES Allergies  Allergen Reactions   Atorvastatin Other (See Comments)    Myalgias   Hydrocodone Other (See Comments)    GI distress.    PAST MEDICAL HISTORY Past Medical History:  Diagnosis Date   Anxiety    Arteriosclerotic cardiovascular disease (ASCVD)     cath Feb '07- no obstructive dz - 20% proximal LAD only; EF 65%; possible coronary artery spasm   Atrial fibrillation (HCC)    Paroxysmal on event recorder in 2009; regular supraventricular tachycardia at a rate of 150 also identified on that study representing either atrial flutter or PSVT; onset of persistent AF in 03/2011   Carcinoma of breast ([place]) 2001   Left mastectomy with positive nodes   Chest pain    Long-standing and atypical   Chronic anticoagulation 04/12/2011   GERD (gastroesophageal reflux disease)  hiatal hernia   Herpes zoster    Hyperlipidemia    Lipid profile in 11/2008:282, 176, 64, 201   Hypertension    diastolic dysfunction; normal CMet and TSH in 11/2009; normal CBC in 04/2010   Malignant neoplasm of breast (female), unspecified site 05/27/2013   right breast lumpectomy with positive node   Peripheral neuropathy    [place] 1946   at age 5   Rectal bleed    Renal insufficiency    Shortness of breath dyspnea    Vitreous hemorrhage of right eye ([place]) 10/23/2019   Past Surgical History:  Procedure Laterality Date   BREAST LUMPECTOMY WITH NEEDLE LOCALIZATION AND AXILLARY SENTINEL LYMPH NODE BX Right 05/27/2013   Procedure: BREAST LUMPECTOMY WITH NEEDLE LOCALIZATION AND AXILLARY SENTINEL LYMPH  NODE BX;  Surgeon: Pedro Earls, MD;  Location: [facility];  Service: General;  Laterality: Right;   [place]   COLONOSCOPY  02/22/2011   Procedure: COLONOSCOPY;  Surgeon: Rogene Houston, MD;  [facility];  Service: Endoscopy;  Laterality: N/A;; performed for scant hematochezia   DILATION AND CURETTAGE OF UTERUS     ESOPHAGOGASTRODUODENOSCOPY  02/22/2011   Procedure: ESOPHAGOGASTRODUODENOSCOPY (EGD);  Surgeon: Rogene Houston, MD;  [facility];  Service: Endoscopy;  Laterality: N/A;   ESOPHAGOGASTRODUODENOSCOPY N/A 06/02/2014   Procedure: ESOPHAGOGASTRODUODENOSCOPY (EGD);  Surgeon: Rogene Houston, MD;  [facility];  Service: Endoscopy;  Laterality: N/A;  730   EYE SURGERY     both cataracts   MALONEY DILATION N/A 06/02/2014   Procedure: MALONEY DILATION;  Surgeon: Rogene Houston, MD;  [facility];  Service: Endoscopy;  Laterality: N/A;   MASTECTOMY  2001   Left;modified radical with lymph node dissection   PORT-A-CATH REMOVAL     pac in and out 2002    FAMILY HISTORY Family History  Problem Relation Age of Onset   Cancer Sister        lung   Cancer Brother        lipoma   Cancer Brother        kidney    SOCIAL HISTORY Social History   Tobacco Use   Smoking status: Never   Smokeless tobacco: Never  Vaping Use   Vaping Use: Never used  Substance Use Topics   Alcohol use: No    Alcohol/week: 0.0 standard drinks   Drug use: No         OPHTHALMIC EXAM:  Base Eye Exam     Visual Acuity (ETDRS)       Right Left   Dist [place] 20/70 -1 20/70 -2   Dist ph [place] 20/50 -2 20/50 -2         Tonometry (Tonopen, 2:29 PM)       Right Left   Pressure 7 9         Pupils       Pupils Dark Light APD   Right PERRL 5 4 None   Left PERRL 4 3 None         Extraocular Movement       Right Left    Full Full         Neuro/Psych     Oriented x3: Yes   Mood/Affect:  Normal         Dilation     Right eye: 1.0% Mydriacyl, 2.5% Phenylephrine @ 2:29 PM  Slit Lamp and Fundus Exam     External Exam       Right Left   External Normal Normal         Slit Lamp Exam       Right Left   Lids/Lashes Normal Normal   Conjunctiva/Sclera White and quiet White and quiet   Cornea Clear Clear   Anterior Chamber Deep and quiet Deep and quiet   Iris Round and reactive Round and reactive   Lens Posterior chamber intraocular lens, Open posterior capsule Posterior chamber intraocular lens   Anterior Vitreous Normal Normal         Fundus Exam       Right Left   Posterior Vitreous clear , vitrectomized    Disc Normal    C/D Ratio 0.35    Macula Geographic atrophy, Intermediate age related macular degeneration, Subretinal hemorrhage thin slightly smaller, paramacular    Vessels Normal    Periphery Normal, no holes             IMAGING AND PROCEDURES  Imaging and Procedures for 05/26/21  OCT, Retina - OU - Both Eyes       Right Eye Quality was borderline. Scan locations included subfoveal. Central Foveal Thickness: 278. Progression has been stable. Findings include abnormal foveal contour, pigment epithelial detachment.   Left Eye Quality was good. Scan locations included subfoveal. Central Foveal Thickness: 249. Progression has been stable. Findings include abnormal foveal contour.   Notes Much less overall thickening and subretinal fluid and edema and intraretinal fluid post injection Avastin.  OD, only 5 weeks post most recent injection, slightly less active, will need to continue with therapy today OD as there is hemorrhage clinically  OS, now 1 week postinjection OS     Intravitreal Injection, Pharmacologic Agent - OD - Right Eye       Time Out 05/26/2021. 2:51 PM. Confirmed correct patient, procedure, site, and patient consented.   Anesthesia Topical anesthesia was used. Anesthetic medications included Lidocaine  4%.   Procedure Preparation included Tobramycin 0.3%, 10% betadine to eyelids, Ofloxacin , 5% betadine to ocular surface. A 30 gauge needle was used.   Injection: 2.5 mg bevacizumab 2.5 MG/0.1ML   Route: Intravitreal, Site: Right Eye   NDC: [phone], Lot: [id number]   Post-op Post injection exam found visual acuity of at least counting fingers. The patient tolerated the procedure well. There were no complications. The patient received written and verbal post procedure care education. Post injection medications included ocuflox.              ASSESSMENT/PLAN:  Exudative age-related macular degeneration of right eye with active choroidal neovascularization (HCC) OD, with clinically present hemorrhage temporal portion of FAZ, currently at 5-week follow-up interval.  Repeat injection today and follow-up again in 5 weeks  Exudative age-related macular degeneration of left eye with active choroidal neovascularization (HCC) OS 1 week post most recent injection follow-up as scheduled      ICD-10-CM   1. Exudative age-related macular degeneration of right eye with active choroidal neovascularization (HCC)  H35.3211 OCT, Retina - OU - Both Eyes    Intravitreal Injection, Pharmacologic Agent - OD - Right Eye    bevacizumab (AVASTIN) SOSY 2.5 mg    2. Exudative age-related macular degeneration of left eye with active choroidal neovascularization ([place])  H35.3221       1.  OD, vastly improved yet still active CNVM will repeat Avastin OD today  2.  Dilate OU next the same day  and likely injection OU for wet AMD per patient request  3.  Ophthalmic Meds Ordered this visit:  Meds ordered this encounter  Medications   bevacizumab (AVASTIN) SOSY 2.5 mg       Return in about 5 weeks (around 06/30/2021) for OD, AVASTIN OCT, dilate,, and follow-up OS as scheduled.  There are no Patient Instructions on file for this visit.   Explained the diagnoses, plan, and follow up with the  patient and they expressed understanding.  Patient expressed understanding of the importance of proper follow up care.   Clent Demark Jama Krichbaum M.D. Diseases & Surgery of the Retina and Vitreous Retina [facility] Diabetic [place] 05/26/21     Abbreviations: M myopia (nearsighted); A astigmatism; H hyperopia (farsighted); P presbyopia; Mrx spectacle prescription;  CTL contact lenses; OD right eye; OS left eye; OU both eyes  XT exotropia; ET esotropia; PEK punctate epithelial keratitis; PEE punctate epithelial erosions; DES dry eye syndrome; MGD meibomian gland dysfunction; ATs artificial tears; PFAT's preservative free artificial tears; [place] nuclear sclerotic cataract; PSC posterior subcapsular cataract; ERM epi-retinal membrane; PVD posterior vitreous detachment; RD retinal detachment; DM diabetes mellitus; DR diabetic retinopathy; NPDR non-proliferative diabetic retinopathy; PDR proliferative diabetic retinopathy; CSME clinically significant macular edema; DME diabetic macular edema; dbh dot blot hemorrhages; CWS cotton wool spot; POAG primary open angle glaucoma; C/D cup-to-disc ratio; HVF humphrey visual field; GVF goldmann visual field; OCT optical coherence tomography; IOP intraocular pressure; BRVO Branch retinal vein occlusion; CRVO central retinal vein occlusion; CRAO central retinal artery occlusion; BRAO branch retinal artery occlusion; RT retinal tear; SB scleral buckle; PPV pars plana vitrectomy; VH Vitreous hemorrhage; PRP panretinal laser photocoagulation; IVK intravitreal kenalog; VMT vitreomacular traction; MH Macular hole;  NVD neovascularization of the disc; NVE neovascularization elsewhere; AREDS age related eye disease study; ARMD age related macular degeneration; POAG primary open angle glaucoma; EBMD epithelial/anterior basement membrane dystrophy; ACIOL anterior chamber intraocular lens; IOL intraocular lens; PCIOL posterior chamber intraocular lens; Phaco/IOL phacoemulsification with intraocular  lens placement; [place] photorefractive keratectomy; LASIK laser assisted in situ keratomileusis; HTN hypertension; DM diabetes mellitus; COPD chronic obstructive pulmonary disease

## 2021-06-13 DIAGNOSIS — N390 Urinary tract infection, site not specified: Secondary | ICD-10-CM | POA: Diagnosis not present

## 2021-06-13 DIAGNOSIS — I10 Essential (primary) hypertension: Secondary | ICD-10-CM | POA: Diagnosis not present

## 2021-06-13 DIAGNOSIS — I4821 Permanent atrial fibrillation: Secondary | ICD-10-CM | POA: Diagnosis not present

## 2021-06-13 DIAGNOSIS — H401112 Primary open-angle glaucoma, right eye, moderate stage: Secondary | ICD-10-CM | POA: Diagnosis not present

## 2021-06-13 DIAGNOSIS — E730 Congenital lactase deficiency: Secondary | ICD-10-CM | POA: Diagnosis not present

## 2021-06-13 DIAGNOSIS — R7309 Other abnormal glucose: Secondary | ICD-10-CM | POA: Diagnosis not present

## 2021-06-21 DIAGNOSIS — Z23 Encounter for immunization: Secondary | ICD-10-CM | POA: Diagnosis not present

## 2021-06-30 ENCOUNTER — Encounter (INDEPENDENT_AMBULATORY_CARE_PROVIDER_SITE_OTHER): Payer: PPO | Admitting: Ophthalmology

## 2021-07-05 ENCOUNTER — Encounter (INDEPENDENT_AMBULATORY_CARE_PROVIDER_SITE_OTHER): Payer: PPO | Admitting: Ophthalmology

## 2021-07-07 ENCOUNTER — Other Ambulatory Visit: Payer: Self-pay

## 2021-07-07 ENCOUNTER — Encounter (INDEPENDENT_AMBULATORY_CARE_PROVIDER_SITE_OTHER): Payer: Self-pay | Admitting: Ophthalmology

## 2021-07-07 ENCOUNTER — Ambulatory Visit (INDEPENDENT_AMBULATORY_CARE_PROVIDER_SITE_OTHER): Payer: PPO | Admitting: Ophthalmology

## 2021-07-07 DIAGNOSIS — H353221 Exudative age-related macular degeneration, left eye, with active choroidal neovascularization: Secondary | ICD-10-CM

## 2021-07-07 DIAGNOSIS — H353211 Exudative age-related macular degeneration, right eye, with active choroidal neovascularization: Secondary | ICD-10-CM

## 2021-07-07 MED ORDER — BEVACIZUMAB 2.5 MG/0.1ML IZ SOSY
2.5000 mg | PREFILLED_SYRINGE | INTRAVITREAL | Status: AC | PRN
  Administered 2021-07-07: 16:00:00 2.5 mg via INTRAVITREAL

## 2021-07-07 MED ORDER — BEVACIZUMAB 2.5 MG/0.1ML IZ SOSY
2.5000 mg | PREFILLED_SYRINGE | INTRAVITREAL | Status: AC | PRN
Start: 2021-07-07 — End: 2021-07-07
  Administered 2021-07-07: 16:00:00 2.5 mg via INTRAVITREAL

## 2021-07-07 NOTE — Assessment & Plan Note (Signed)
Overall improved yet still with intraretinal fluid temporally, at 7-week follow-up interval today.  Follow-up in 6 weeks OS as well after injection today

## 2021-07-07 NOTE — Progress Notes (Signed)
07/07/2021     CHIEF COMPLAINT Patient presents for  Chief Complaint  Patient presents with   Retina Follow Up      HISTORY OF PRESENT ILLNESS: [name] is a 87 y.o. female who presents to the clinic today for:   HPI     Retina Follow Up           Diagnosis: Wet AMD   Laterality: both eyes   Onset: 6 weeks ago   Severity: mild   Duration: 6 weeks   Course: stable         Comments   7 week fu os and avastin OS and 6 weeks Avastin OD Last injection OS: 05/19/2021  Last Injection OD:  05/26/2021  Pt states VA OU stable since last visit. Pt denies FOL, floaters, or ocular pain OU.         Last edited by Kendra Opitz, COA on 07/07/2021  3:16 PM.      Referring physician: Asencion Noble, MD [address] [place],  [place] [address]  HISTORICAL INFORMATION:   Selected notes from the MEDICAL RECORD NUMBER       CURRENT MEDICATIONS: Current Outpatient Medications (Ophthalmic Drugs)  Medication Sig   dorzolamide-timolol (COSOPT) 22.3-6.8 MG/ML ophthalmic solution Place 1 drop into the right eye 2 (two) times daily.    Latanoprostene Bunod 0.024 % SOLN Place 1 drop into the right eye at bedtime.    No current facility-administered medications for this visit. (Ophthalmic Drugs)   Current Outpatient Medications (Other)  Medication Sig   acetaminophen (TYLENOL) 500 MG tablet Take 500 mg by mouth every 6 (six) hours as needed. Patient reports that she may take 1 or 2 times a day for hip pain.   albuterol (VENTOLIN HFA) 108 (90 Base) MCG/ACT inhaler Inhale 2 puffs into the lungs every 4 (four) hours as needed.   carvedilol (COREG) 12.5 MG tablet Take 12.5 mg by mouth 2 (two) times daily with a meal.    ELIQUIS 5 MG TABS tablet TAKE (1) TABLET BY MOUTH TWICE DAILY FOR BLOOD THINNER.   furosemide (LASIX) 40 MG tablet TAKE ONE TABLET BY MOUTH ONCE DAILY.   losartan (COZAAR) 25 MG tablet Take 1 tablet (25 mg total) by mouth 2 (two) times daily.    Multiple Vitamins-Minerals (VISION FORMULA EYE HEALTH PO) Take 1 tablet by mouth 2 (two) times daily.    pantoprazole (PROTONIX) 40 MG tablet TAKE ONE TABLET BY MOUTH DAILY BEFORE BREAKFAST FOR ACID REFLUX.   pramoxine-hydrocortisone (PROCTOCREAM-HC) 1-1 % rectal cream Place 1 application rectally 2 (two) times daily as needed for hemorrhoids or anal itching.   No current facility-administered medications for this visit. (Other)      REVIEW OF SYSTEMS:    ALLERGIES Allergies  Allergen Reactions   Atorvastatin Other (See Comments)    Myalgias   Hydrocodone Other (See Comments)    GI distress.    PAST MEDICAL HISTORY Past Medical History:  Diagnosis Date   Anxiety    Arteriosclerotic cardiovascular disease (ASCVD)     cath Feb '07- no obstructive dz - 20% proximal LAD only; EF 65%; possible coronary artery spasm   Atrial fibrillation (HCC)    Paroxysmal on event recorder in 2009; regular supraventricular tachycardia at a rate of 150 also identified on that study representing either atrial flutter or PSVT; onset of persistent AF in 03/2011   Carcinoma of breast ([place]) 2001   Left mastectomy with positive nodes   Chest  pain    Long-standing and atypical   Chronic anticoagulation 04/12/2011   GERD (gastroesophageal reflux disease)    hiatal hernia   Herpes zoster    Hyperlipidemia    Lipid profile in 11/2008:282, 176, 64, 201   Hypertension    diastolic dysfunction; normal CMet and TSH in 11/2009; normal CBC in 04/2010   Malignant neoplasm of breast (female), unspecified site 05/27/2013   right breast lumpectomy with positive node   Peripheral neuropathy    [place] 1946   at age 69   Rectal bleed    Renal insufficiency    Shortness of breath dyspnea    Vitreous hemorrhage of right eye ([place]) 10/23/2019   Past Surgical History:  Procedure Laterality Date   BREAST LUMPECTOMY WITH NEEDLE LOCALIZATION AND AXILLARY SENTINEL LYMPH NODE BX Right 05/27/2013   Procedure: BREAST  LUMPECTOMY WITH NEEDLE LOCALIZATION AND AXILLARY SENTINEL LYMPH NODE BX;  Surgeon: Pedro Earls, MD;  Location: [facility];  Service: General;  Laterality: Right;   [place]   COLONOSCOPY  02/22/2011   Procedure: COLONOSCOPY;  Surgeon: Rogene Houston, MD;  [facility];  Service: Endoscopy;  Laterality: N/A;; performed for scant hematochezia   DILATION AND CURETTAGE OF UTERUS     ESOPHAGOGASTRODUODENOSCOPY  02/22/2011   Procedure: ESOPHAGOGASTRODUODENOSCOPY (EGD);  Surgeon: Rogene Houston, MD;  [facility];  Service: Endoscopy;  Laterality: N/A;   ESOPHAGOGASTRODUODENOSCOPY N/A 06/02/2014   Procedure: ESOPHAGOGASTRODUODENOSCOPY (EGD);  Surgeon: Rogene Houston, MD;  [facility];  Service: Endoscopy;  Laterality: N/A;  730   EYE SURGERY     both cataracts   MALONEY DILATION N/A 06/02/2014   Procedure: MALONEY DILATION;  Surgeon: Rogene Houston, MD;  [facility];  Service: Endoscopy;  Laterality: N/A;   MASTECTOMY  2001   Left;modified radical with lymph node dissection   PORT-A-CATH REMOVAL     pac in and out 2002    FAMILY HISTORY Family History  Problem Relation Age of Onset   Cancer Sister        lung   Cancer Brother        lipoma   Cancer Brother        kidney    SOCIAL HISTORY Social History   Tobacco Use   Smoking status: Never   Smokeless tobacco: Never  Vaping Use   Vaping Use: Never used  Substance Use Topics   Alcohol use: No    Alcohol/week: 0.0 standard drinks   Drug use: No         OPHTHALMIC EXAM:  Base Eye Exam     Visual Acuity (ETDRS)       Right Left   Dist [place] 20/100 20/80   Dist ph [place] 20/60 20/70 -2         Tonometry (Tonopen, 3:20 PM)       Right Left   Pressure 14 10         Pupils       Pupils APD   Right PERRL None   Left PERRL None         Visual Fields (Counting fingers)       Left Right    Full Full          Neuro/Psych     Oriented x3: Yes   Mood/Affect: Normal         Dilation     Both eyes:  1.0% Mydriacyl, 2.5% Phenylephrine @ 3:20 PM           Slit Lamp and Fundus Exam     External Exam       Right Left   External Normal Normal         Slit Lamp Exam       Right Left   Lids/Lashes Normal Normal   Conjunctiva/Sclera White and quiet White and quiet   Cornea Clear Clear   Anterior Chamber Deep and quiet Deep and quiet   Iris Round and reactive Round and reactive   Lens Posterior chamber intraocular lens, Open posterior capsule Posterior chamber intraocular lens   Anterior Vitreous Normal Normal         Fundus Exam       Right Left   Posterior Vitreous clear , vitrectomized Posterior vitreous detachment   Disc Normal Normal   C/D Ratio 0.35 0.3   Macula Geographic atrophy, Intermediate age related macular degeneration, Subretinal hemorrhage thin slightly smaller, paramacular Disciform scar, [place], no exudates, no hemorrhage, no macular thickening, Mottling, Retinal pigment epithelial mottling, Atrophy, Retinal pigment epithelial atrophy, Age related macular degeneration, Early age related macular degeneration, Geographic atrophy near the FAZ, Pigmented atrophy, Hard drusen,   Vessels Normal Normal   Periphery Normal, no holes Normal, no holes            IMAGING AND PROCEDURES  Imaging and Procedures for 07/07/21  OCT, Retina - OU - Both Eyes       Right Eye Quality was borderline. Scan locations included subfoveal. Central Foveal Thickness: 293. Progression has been stable. Findings include abnormal foveal contour, pigment epithelial detachment.   Left Eye Quality was good. Scan locations included subfoveal. Central Foveal Thickness: 320. Progression has been stable. Findings include abnormal foveal contour, cystoid macular edema, intraretinal fluid.   Notes Much less overall thickening and subretinal fluid and edema and intraretinal fluid post  injection Avastin.  OD, only 6 weeks post most recent injection, slightly less active, will need to continue with therapy today OD as there is hemorrhage clinically  OS, now 7 week postinjection OS repeat injection today     Intravitreal Injection, Pharmacologic Agent - OD - Right Eye       Time Out 07/07/2021. 4:16 PM. Confirmed correct patient, procedure, site, and patient consented.   Anesthesia Topical anesthesia was used. Anesthetic medications included Lidocaine 4%.   Procedure Preparation included Tobramycin 0.3%, 10% betadine to eyelids, Ofloxacin , 5% betadine to ocular surface. A 30 gauge needle was used.   Injection: 2.5 mg bevacizumab 2.5 MG/0.1ML   Route: Intravitreal, Site: Right Eye   NDC: [phone], Lot: [id number]   Post-op Post injection exam found visual acuity of at least counting fingers. The patient tolerated the procedure well. There were no complications. The patient received written and verbal post procedure care education. Post injection medications included ocuflox.      Intravitreal Injection, Pharmacologic Agent - OS - Left Eye       Time Out 07/07/2021. 4:16 PM. Confirmed correct patient, procedure, site, and patient consented.   Anesthesia Topical anesthesia was used. Anesthetic medications included Lidocaine 4%.   Procedure Preparation included Ofloxacin , 10% betadine to eyelids, 5% betadine to ocular surface. A supplied needle was used.   Injection: 2.5 mg bevacizumab 2.5 MG/0.1ML   Route: Intravitreal, Site: Left Eye   NDC: [phone], Lot: [id number]   Post-op Post injection exam found visual acuity of at least counting fingers. The patient tolerated the  procedure well. There were no complications. The patient received written and verbal post procedure care education. Post injection medications included ocuflox.   Notes [id number] number             ASSESSMENT/PLAN:  Exudative age-related macular degeneration of right eye  with active choroidal neovascularization ([place]) Less SR hemorrhage, as compared to last visit examination and injection 6 weeks previous.  Repeat injection today and follow-up in 6 weeks  Exudative age-related macular degeneration of left eye with active choroidal neovascularization ([place]) Overall improved yet still with intraretinal fluid temporally, at 7-week follow-up interval today.  Follow-up in 6 weeks OS as well after injection today     ICD-10-CM   1. Exudative age-related macular degeneration of right eye with active choroidal neovascularization (HCC)  H35.3211 OCT, Retina - OU - Both Eyes    Intravitreal Injection, Pharmacologic Agent - OD - Right Eye    bevacizumab (AVASTIN) SOSY 2.5 mg    2. Exudative age-related macular degeneration of left eye with active choroidal neovascularization (HCC)  H35.3221 OCT, Retina - OU - Both Eyes    Intravitreal Injection, Pharmacologic Agent - OS - Left Eye    bevacizumab (AVASTIN) SOSY 2.5 mg      1.  OU still active intraretinal fluid or subretinal hemorrhage from wet AMD.  Repeat injection Avastin today at 6-week interval maintain 6-week follow-up interval  2.  Late OU next likely Avastin OCT OU  3.  Ophthalmic Meds Ordered this visit:  Meds ordered this encounter  Medications   bevacizumab (AVASTIN) SOSY 2.5 mg   bevacizumab (AVASTIN) SOSY 2.5 mg       Return in about 6 weeks (around 08/18/2021) for DILATE OU, AVASTIN OCT, OD, OS.  There are no Patient Instructions on file for this visit.   Explained the diagnoses, plan, and follow up with the patient and they expressed understanding.  Patient expressed understanding of the importance of proper follow up care.   Clent Demark Pilar Corrales M.D. Diseases & Surgery of the Retina and Vitreous Retina & Diabetic [place] 07/07/21     Abbreviations: M myopia (nearsighted); A astigmatism; H hyperopia (farsighted); P presbyopia; Mrx spectacle prescription;  CTL contact lenses; OD right eye;  OS left eye; OU both eyes  XT exotropia; ET esotropia; PEK punctate epithelial keratitis; PEE punctate epithelial erosions; DES dry eye syndrome; MGD meibomian gland dysfunction; ATs artificial tears; PFAT's preservative free artificial tears; [place] nuclear sclerotic cataract; PSC posterior subcapsular cataract; ERM epi-retinal membrane; PVD posterior vitreous detachment; RD retinal detachment; DM diabetes mellitus; DR diabetic retinopathy; NPDR non-proliferative diabetic retinopathy; PDR proliferative diabetic retinopathy; CSME clinically significant macular edema; DME diabetic macular edema; dbh dot blot hemorrhages; CWS cotton wool spot; POAG primary open angle glaucoma; C/D cup-to-disc ratio; HVF humphrey visual field; GVF goldmann visual field; OCT optical coherence tomography; IOP intraocular pressure; BRVO Branch retinal vein occlusion; CRVO central retinal vein occlusion; CRAO central retinal artery occlusion; BRAO branch retinal artery occlusion; RT retinal tear; SB scleral buckle; PPV pars plana vitrectomy; VH Vitreous hemorrhage; PRP panretinal laser photocoagulation; IVK intravitreal kenalog; VMT vitreomacular traction; MH Macular hole;  NVD neovascularization of the disc; NVE neovascularization elsewhere; AREDS age related eye disease study; ARMD age related macular degeneration; POAG primary open angle glaucoma; EBMD epithelial/anterior basement membrane dystrophy; ACIOL anterior chamber intraocular lens; IOL intraocular lens; PCIOL posterior chamber intraocular lens; Phaco/IOL phacoemulsification with intraocular lens placement; [place] photorefractive keratectomy; LASIK laser assisted in situ keratomileusis; HTN hypertension; DM diabetes mellitus; COPD chronic obstructive  pulmonary disease

## 2021-07-07 NOTE — Assessment & Plan Note (Signed)
Less SR hemorrhage, as compared to last visit examination and injection 6 weeks previous.  Repeat injection today and follow-up in 6 weeks

## 2021-07-27 DIAGNOSIS — X32XXXD Exposure to sunlight, subsequent encounter: Secondary | ICD-10-CM | POA: Diagnosis not present

## 2021-07-27 DIAGNOSIS — L820 Inflamed seborrheic keratosis: Secondary | ICD-10-CM | POA: Diagnosis not present

## 2021-07-27 DIAGNOSIS — L570 Actinic keratosis: Secondary | ICD-10-CM | POA: Diagnosis not present

## 2021-08-02 DIAGNOSIS — H40002 Preglaucoma, unspecified, left eye: Secondary | ICD-10-CM | POA: Diagnosis not present

## 2021-08-02 DIAGNOSIS — H401112 Primary open-angle glaucoma, right eye, moderate stage: Secondary | ICD-10-CM | POA: Diagnosis not present

## 2021-08-10 DIAGNOSIS — D692 Other nonthrombocytopenic purpura: Secondary | ICD-10-CM | POA: Diagnosis not present

## 2021-08-10 DIAGNOSIS — J449 Chronic obstructive pulmonary disease, unspecified: Secondary | ICD-10-CM | POA: Diagnosis not present

## 2021-08-10 DIAGNOSIS — D6869 Other thrombophilia: Secondary | ICD-10-CM | POA: Diagnosis not present

## 2021-08-10 DIAGNOSIS — N183 Chronic kidney disease, stage 3 unspecified: Secondary | ICD-10-CM | POA: Diagnosis not present

## 2021-08-10 DIAGNOSIS — Z6831 Body mass index (BMI) 31.0-31.9, adult: Secondary | ICD-10-CM | POA: Diagnosis not present

## 2021-08-10 DIAGNOSIS — I480 Paroxysmal atrial fibrillation: Secondary | ICD-10-CM | POA: Diagnosis not present

## 2021-08-10 DIAGNOSIS — H353290 Exudative age-related macular degeneration, unspecified eye, stage unspecified: Secondary | ICD-10-CM | POA: Diagnosis not present

## 2021-08-10 DIAGNOSIS — Z7901 Long term (current) use of anticoagulants: Secondary | ICD-10-CM | POA: Diagnosis not present

## 2021-08-10 DIAGNOSIS — I739 Peripheral vascular disease, unspecified: Secondary | ICD-10-CM | POA: Diagnosis not present

## 2021-08-10 DIAGNOSIS — G620 Drug-induced polyneuropathy: Secondary | ICD-10-CM | POA: Diagnosis not present

## 2021-08-10 DIAGNOSIS — T451X5S Adverse effect of antineoplastic and immunosuppressive drugs, sequela: Secondary | ICD-10-CM | POA: Diagnosis not present

## 2021-08-10 DIAGNOSIS — I5020 Unspecified systolic (congestive) heart failure: Secondary | ICD-10-CM | POA: Diagnosis not present

## 2021-08-18 ENCOUNTER — Encounter (INDEPENDENT_AMBULATORY_CARE_PROVIDER_SITE_OTHER): Payer: Self-pay | Admitting: Ophthalmology

## 2021-08-18 ENCOUNTER — Other Ambulatory Visit: Payer: Self-pay

## 2021-08-18 ENCOUNTER — Ambulatory Visit (INDEPENDENT_AMBULATORY_CARE_PROVIDER_SITE_OTHER): Payer: PPO | Admitting: Ophthalmology

## 2021-08-18 DIAGNOSIS — H353221 Exudative age-related macular degeneration, left eye, with active choroidal neovascularization: Secondary | ICD-10-CM

## 2021-08-18 DIAGNOSIS — H353211 Exudative age-related macular degeneration, right eye, with active choroidal neovascularization: Secondary | ICD-10-CM

## 2021-08-18 MED ORDER — BEVACIZUMAB 2.5 MG/0.1ML IZ SOSY
2.5000 mg | PREFILLED_SYRINGE | INTRAVITREAL | Status: AC | PRN
  Administered 2021-08-18: 2.5 mg via INTRAVITREAL

## 2021-08-18 NOTE — Progress Notes (Signed)
08/18/2021     CHIEF COMPLAINT Patient presents for  Chief Complaint  Patient presents with   Macular Degeneration      HISTORY OF PRESENT ILLNESS: [name] is a 88 y.o. female who presents to the clinic today for:   HPI   Follow-up today at 6 weeks with a history of bilateral wet AMD.  Clinical hemorrhage noted in the right eye in the past.  Per patient stable acuity over the last 6 weeks. Last edited by Hurman Horn, MD on 08/18/2021  2:25 PM.      Referring physician: Asencion Noble, MD [address] [place],  [place] [address]  HISTORICAL INFORMATION:   Selected notes from the MEDICAL RECORD NUMBER       CURRENT MEDICATIONS: Current Outpatient Medications (Ophthalmic Drugs)  Medication Sig   dorzolamide-timolol (COSOPT) 22.3-6.8 MG/ML ophthalmic solution Place 1 drop into the right eye 2 (two) times daily.    Latanoprostene Bunod 0.024 % SOLN Place 1 drop into the right eye at bedtime.    No current facility-administered medications for this visit. (Ophthalmic Drugs)   Current Outpatient Medications (Other)  Medication Sig   acetaminophen (TYLENOL) 500 MG tablet Take 500 mg by mouth every 6 (six) hours as needed. Patient reports that she may take 1 or 2 times a day for hip pain.   albuterol (VENTOLIN HFA) 108 (90 Base) MCG/ACT inhaler Inhale 2 puffs into the lungs every 4 (four) hours as needed.   carvedilol (COREG) 12.5 MG tablet Take 12.5 mg by mouth 2 (two) times daily with a meal.    ELIQUIS 5 MG TABS tablet TAKE (1) TABLET BY MOUTH TWICE DAILY FOR BLOOD THINNER.   furosemide (LASIX) 40 MG tablet TAKE ONE TABLET BY MOUTH ONCE DAILY.   losartan (COZAAR) 25 MG tablet Take 1 tablet (25 mg total) by mouth 2 (two) times daily.   Multiple Vitamins-Minerals (VISION FORMULA EYE HEALTH PO) Take 1 tablet by mouth 2 (two) times daily.    pantoprazole (PROTONIX) 40 MG tablet TAKE ONE TABLET BY MOUTH DAILY BEFORE BREAKFAST FOR ACID REFLUX.    pramoxine-hydrocortisone (PROCTOCREAM-HC) 1-1 % rectal cream Place 1 application rectally 2 (two) times daily as needed for hemorrhoids or anal itching.   No current facility-administered medications for this visit. (Other)      REVIEW OF SYSTEMS: ROS   Negative for: Constitutional, Gastrointestinal, Neurological, Skin, Genitourinary, Musculoskeletal, HENT, Endocrine, Cardiovascular, Eyes, Respiratory, Psychiatric, Allergic/Imm, Heme/Lymph Last edited by Hurman Horn, MD on 08/18/2021  2:25 PM.       ALLERGIES Allergies  Allergen Reactions   Atorvastatin Other (See Comments)    Myalgias   Hydrocodone Other (See Comments)    GI distress.    PAST MEDICAL HISTORY Past Medical History:  Diagnosis Date   Anxiety    Arteriosclerotic cardiovascular disease (ASCVD)     cath Feb '07- no obstructive dz - 20% proximal LAD only; EF 65%; possible coronary artery spasm   Atrial fibrillation (HCC)    Paroxysmal on event recorder in 2009; regular supraventricular tachycardia at a rate of 150 also identified on that study representing either atrial flutter or PSVT; onset of persistent AF in 03/2011   Carcinoma of breast ([place]) 2001   Left mastectomy with positive nodes   Chest pain    Long-standing and atypical   Chronic anticoagulation 04/12/2011   GERD (gastroesophageal reflux disease)    hiatal hernia   Herpes zoster    Hyperlipidemia  Lipid profile in 11/2008:282, 176, 64, 201   Hypertension    diastolic dysfunction; normal CMet and TSH in 11/2009; normal CBC in 04/2010   Malignant neoplasm of breast (female), unspecified site 05/27/2013   right breast lumpectomy with positive node   Peripheral neuropathy    Polio 1946   at age 13   Rectal bleed    Renal insufficiency    Shortness of breath dyspnea    Vitreous hemorrhage of right eye ([place]) 10/23/2019   Past Surgical History:  Procedure Laterality Date   BREAST LUMPECTOMY WITH NEEDLE LOCALIZATION AND AXILLARY SENTINEL LYMPH NODE  BX Right 05/27/2013   Procedure: BREAST LUMPECTOMY WITH NEEDLE LOCALIZATION AND AXILLARY SENTINEL LYMPH NODE BX;  Surgeon: Pedro Earls, MD;  Location: [facility];  Service: General;  Laterality: Right;   [place]   COLONOSCOPY  02/22/2011   Procedure: COLONOSCOPY;  Surgeon: Rogene Houston, MD;  [facility];  Service: Endoscopy;  Laterality: N/A;; performed for scant hematochezia   DILATION AND CURETTAGE OF UTERUS     ESOPHAGOGASTRODUODENOSCOPY  02/22/2011   Procedure: ESOPHAGOGASTRODUODENOSCOPY (EGD);  Surgeon: Rogene Houston, MD;  [facility];  Service: Endoscopy;  Laterality: N/A;   ESOPHAGOGASTRODUODENOSCOPY N/A 06/02/2014   Procedure: ESOPHAGOGASTRODUODENOSCOPY (EGD);  Surgeon: Rogene Houston, MD;  [facility];  Service: Endoscopy;  Laterality: N/A;  730   EYE SURGERY     both cataracts   MALONEY DILATION N/A 06/02/2014   Procedure: MALONEY DILATION;  Surgeon: Rogene Houston, MD;  [facility];  Service: Endoscopy;  Laterality: N/A;   MASTECTOMY  2001   Left;modified radical with lymph node dissection   PORT-A-CATH REMOVAL     pac in and out 2002    FAMILY HISTORY Family History  Problem Relation Age of Onset   Cancer Sister        lung   Cancer Brother        lipoma   Cancer Brother        kidney    SOCIAL HISTORY Social History   Tobacco Use   Smoking status: Never   Smokeless tobacco: Never  Vaping Use   Vaping Use: Never used  Substance Use Topics   Alcohol use: No    Alcohol/week: 0.0 standard drinks   Drug use: No         OPHTHALMIC EXAM:  Base Eye Exam     Visual Acuity (ETDRS)       Right Left   Dist [place] 20/100 20/80   Dist ph [place] 20/80 20/50 -1         Tonometry (Tonopen, 2:05 PM)       Right Left   Pressure 9 11         Pupils       Pupils APD   Right PERRL None   Left PERRL          Visual Fields       Left Right    Full  Full         Neuro/Psych     Oriented x3: Yes   Mood/Affect: Normal         Dilation     Both eyes: 1.0% Mydriacyl, 2.5% Phenylephrine @ 2:05 PM           Slit Lamp and Fundus Exam     External Exam       Right Left  External Normal Normal         Slit Lamp Exam       Right Left   Lids/Lashes Normal Normal   Conjunctiva/Sclera White and quiet White and quiet   Cornea Clear Clear   Anterior Chamber Deep and quiet Deep and quiet   Iris Round and reactive Round and reactive   Lens Posterior chamber intraocular lens, Open posterior capsule Posterior chamber intraocular lens   Anterior Vitreous Normal Normal         Fundus Exam       Right Left   Posterior Vitreous clear , vitrectomized Posterior vitreous detachment   Disc Normal Normal   C/D Ratio 0.35 0.3   Macula Geographic atrophy, Intermediate age related macular degeneration, Subretinal hemorrhage thin slightly smaller, paramacular Disciform scar, [place], no exudates, no hemorrhage, no macular thickening, Mottling, Retinal pigment epithelial mottling, Atrophy, Retinal pigment epithelial atrophy, Age related macular degeneration, Early age related macular degeneration, Geographic atrophy near the FAZ, Pigmented atrophy, Hard drusen,   Vessels Normal Normal   Periphery Normal, no holes Normal, no holes            IMAGING AND PROCEDURES  Imaging and Procedures for 08/18/21  OCT, Retina - OU - Both Eyes       Right Eye Quality was borderline. Scan locations included subfoveal. Central Foveal Thickness: 313. Progression has been stable. Findings include abnormal foveal contour, pigment epithelial detachment.   Left Eye Quality was good. Scan locations included subfoveal. Central Foveal Thickness: 310. Progression has been stable. Findings include abnormal foveal contour, cystoid macular edema, intraretinal fluid.   Notes Much less overall thickening and subretinal fluid and edema and  intraretinal fluid post injection Avastin.  OD, only 6 weeks post most recent injection, slightly less active, will need to continue with therapy today OD as there is hemorrhage clinically  OS, now 6 week postinjection OS repeat injection today     Intravitreal Injection, Pharmacologic Agent - OD - Right Eye       Time Out 08/18/2021. 2:16 PM. Confirmed correct patient, procedure, site, and patient consented.   Anesthesia Topical anesthesia was used. Anesthetic medications included Lidocaine 4%.   Procedure Preparation included Tobramycin 0.3%, 10% betadine to eyelids, Ofloxacin , 5% betadine to ocular surface. A 30 gauge needle was used.   Injection: 2.5 mg bevacizumab 2.5 MG/0.1ML   Route: Intravitreal, Site: Right Eye   NDC: [phone], Lot: [id number]   Post-op Post injection exam found visual acuity of at least counting fingers. The patient tolerated the procedure well. There were no complications. The patient received written and verbal post procedure care education. Post injection medications included ocuflox.      Intravitreal Injection, Pharmacologic Agent - OS - Left Eye       Time Out 08/18/2021. 2:17 PM. Confirmed correct patient, procedure, site, and patient consented.   Anesthesia Topical anesthesia was used. Anesthetic medications included Lidocaine 4%.   Procedure Preparation included Ofloxacin , 10% betadine to eyelids, 5% betadine to ocular surface. A supplied needle was used.   Injection: 2.5 mg bevacizumab 2.5 MG/0.1ML   Route: Intravitreal, Site: Left Eye   NDC: [phone], Lot: [id number]   Post-op Post injection exam found visual acuity of at least counting fingers. The patient tolerated the procedure well. There were no complications. The patient received written and verbal post procedure care education. Post injection medications included ocuflox.   Notes [id number] number  ASSESSMENT/PLAN:  Exudative age-related macular  degeneration of right eye with active choroidal neovascularization (HCC) OD today at 6-week interval, visual acuity watch less intraretinal hemorrhage, stable acuity currently at 6-week interval  Exudative age-related macular degeneration of left eye with active choroidal neovascularization (HCC) OS today at 6 weeks postinjection, stable acuity     ICD-10-CM   1. Exudative age-related macular degeneration of right eye with active choroidal neovascularization (HCC)  H35.3211 OCT, Retina - OU - Both Eyes    Intravitreal Injection, Pharmacologic Agent - OD - Right Eye    bevacizumab (AVASTIN) SOSY 2.5 mg    2. Exudative age-related macular degeneration of left eye with active choroidal neovascularization (HCC)  H35.3221 OCT, Retina - OU - Both Eyes    Intravitreal Injection, Pharmacologic Agent - OS - Left Eye    bevacizumab (AVASTIN) SOSY 2.5 mg      1.  By OCT and clinical findings vastly improved macular condition on antivegF therapy currently at 6-week interval OU.  Repeat injection today and examination again in 6 weeks  2.  3.  Ophthalmic Meds Ordered this visit:  Meds ordered this encounter  Medications   bevacizumab (AVASTIN) SOSY 2.5 mg   bevacizumab (AVASTIN) SOSY 2.5 mg       Return in about 6 weeks (around 09/29/2021) for DILATE OU, AVASTIN OCT, OD, OS.  There are no Patient Instructions on file for this visit.   Explained the diagnoses, plan, and follow up with the patient and they expressed understanding.  Patient expressed understanding of the importance of proper follow up care.   Clent Demark Quenna Doepke M.D. Diseases & Surgery of the Retina and Vitreous [facility] [place] 08/18/21     Abbreviations: M myopia (nearsighted); A astigmatism; H hyperopia (farsighted); P presbyopia; Mrx spectacle prescription;  CTL contact lenses; OD right eye; OS left eye; OU both eyes  XT exotropia; ET esotropia; PEK punctate epithelial keratitis; PEE punctate epithelial  erosions; DES dry eye syndrome; MGD meibomian gland dysfunction; ATs artificial tears; PFAT's preservative free artificial tears; [place] nuclear sclerotic cataract; PSC posterior subcapsular cataract; ERM epi-retinal membrane; PVD posterior vitreous detachment; RD retinal detachment; DM diabetes mellitus; DR diabetic retinopathy; NPDR non-proliferative diabetic retinopathy; PDR proliferative diabetic retinopathy; CSME clinically significant macular edema; DME diabetic macular edema; dbh dot blot hemorrhages; CWS cotton wool spot; POAG primary open angle glaucoma; C/D cup-to-disc ratio; HVF humphrey visual field; GVF goldmann visual field; OCT optical coherence tomography; IOP intraocular pressure; BRVO Branch retinal vein occlusion; CRVO central retinal vein occlusion; CRAO central retinal artery occlusion; BRAO branch retinal artery occlusion; RT retinal tear; SB scleral buckle; PPV pars plana vitrectomy; VH Vitreous hemorrhage; PRP panretinal laser photocoagulation; IVK intravitreal kenalog; VMT vitreomacular traction; MH Macular hole;  NVD neovascularization of the disc; NVE neovascularization elsewhere; AREDS age related eye disease study; ARMD age related macular degeneration; POAG primary open angle glaucoma; EBMD epithelial/anterior basement membrane dystrophy; ACIOL anterior chamber intraocular lens; IOL intraocular lens; PCIOL posterior chamber intraocular lens; Phaco/IOL phacoemulsification with intraocular lens placement; [place] photorefractive keratectomy; LASIK laser assisted in situ keratomileusis; HTN hypertension; DM diabetes mellitus; COPD chronic obstructive pulmonary disease

## 2021-08-18 NOTE — Assessment & Plan Note (Signed)
OS today at 6 weeks postinjection, stable acuity

## 2021-08-18 NOTE — Assessment & Plan Note (Addendum)
OD today at 6-week interval, visual acuity watch less intraretinal hemorrhage, stable acuity currently at 6-week interval

## 2021-09-06 ENCOUNTER — Other Ambulatory Visit: Payer: Self-pay

## 2021-09-06 ENCOUNTER — Ambulatory Visit (INDEPENDENT_AMBULATORY_CARE_PROVIDER_SITE_OTHER): Payer: PPO | Admitting: Internal Medicine

## 2021-09-06 ENCOUNTER — Encounter: Payer: Self-pay | Admitting: Internal Medicine

## 2021-09-06 VITALS — BP 116/78 | HR 84 | Ht 60.0 in | Wt 164.0 lb

## 2021-09-06 DIAGNOSIS — I5032 Chronic diastolic (congestive) heart failure: Secondary | ICD-10-CM | POA: Diagnosis not present

## 2021-09-06 DIAGNOSIS — I4891 Unspecified atrial fibrillation: Secondary | ICD-10-CM | POA: Diagnosis not present

## 2021-09-06 NOTE — Patient Instructions (Signed)
Medication Instructions:  Your physician recommends that you continue on your current medications as directed. Please refer to the Current Medication list given to you today.  *If you need a refill on your cardiac medications before your next appointment, please call your pharmacy*   Lab Work: NONE   If you have labs (blood work) drawn today and your tests are completely normal, you will receive your results only by: . MyChart Message (if you have MyChart) OR . A paper copy in the mail If you have any lab test that is abnormal or we need to change your treatment, we will call you to review the results.   Testing/Procedures: NONE    Follow-Up: At [facility], you and your health needs are our priority.  As part of our continuing mission to provide you with exceptional heart care, we have created designated Provider Care Teams.  These Care Teams include your primary Cardiologist (physician) and Advanced Practice Providers (APPs -  Physician Assistants and Nurse Practitioners) who all work together to provide you with the care you need, when you need it.  We recommend signing up for the patient portal called "MyChart".  Sign up information is provided on this After Visit Summary.  MyChart is used to connect with patients for Virtual Visits (Telemedicine).  Patients are able to view lab/test results, encounter notes, upcoming appointments, etc.  Non-urgent messages can be sent to your provider as well.   To learn more about what you can do with MyChart, go to [email or web address].    Your next appointment:   1 year(s)  The format for your next appointment:   In Person  Provider:   Gregg Taylor, MD   Other Instructions Thank you for choosing [facility]!    

## 2021-09-06 NOTE — Progress Notes (Signed)
HPI [name] returns today for followup. She is a pleasant 88 yo woman with a h/o  HTN, chronic diastolic heart failure, and chronic atrial fib. She has class 2 symptoms. She has mild LV dysfunction. She takes her lasix 3 times a week but still complains about being unable to go out. She has not had syncope. No chest pain. She has mild class 2 peripheral edema. Allergies  Allergen Reactions   Atorvastatin Other (See Comments)    Myalgias   Hydrocodone Other (See Comments)    GI distress.     Current Outpatient Medications  Medication Sig Dispense Refill   acetaminophen (TYLENOL) 500 MG tablet Take 500 mg by mouth every 6 (six) hours as needed. Patient reports that she may take 1 or 2 times a day for hip pain.     albuterol (VENTOLIN HFA) 108 (90 Base) MCG/ACT inhaler Inhale 2 puffs into the lungs every 4 (four) hours as needed.     carvedilol (COREG) 12.5 MG tablet Take 12.5 mg by mouth 2 (two) times daily with a meal.      dorzolamide-timolol (COSOPT) 22.3-6.8 MG/ML ophthalmic solution Place 1 drop into the right eye 2 (two) times daily.      ELIQUIS 5 MG TABS tablet TAKE (1) TABLET BY MOUTH TWICE DAILY FOR BLOOD THINNER. 60 tablet 5   furosemide (LASIX) 40 MG tablet TAKE ONE TABLET BY MOUTH ONCE DAILY. 90 tablet 3   Latanoprostene Bunod 0.024 % SOLN Place 1 drop into the right eye at bedtime.      losartan (COZAAR) 25 MG tablet Take 1 tablet (25 mg total) by mouth 2 (two) times daily. 90 tablet 3   Multiple Vitamins-Minerals (VISION FORMULA EYE HEALTH PO) Take 1 tablet by mouth 2 (two) times daily.      pantoprazole (PROTONIX) 40 MG tablet TAKE ONE TABLET BY MOUTH DAILY BEFORE BREAKFAST FOR ACID REFLUX. 90 tablet 0   pramoxine-hydrocortisone (PROCTOCREAM-HC) 1-1 % rectal cream Place 1 application rectally 2 (two) times daily as needed for hemorrhoids or anal itching. 30 g 1   No current facility-administered medications for this visit.     Past Medical History:  Diagnosis  Date   Anxiety    Arteriosclerotic cardiovascular disease (ASCVD)     cath Feb '07- no obstructive dz - 20% proximal LAD only; EF 65%; possible coronary artery spasm   Atrial fibrillation (HCC)    Paroxysmal on event recorder in 2009; regular supraventricular tachycardia at a rate of 150 also identified on that study representing either atrial flutter or PSVT; onset of persistent AF in 03/2011   Carcinoma of breast ([place]) 2001   Left mastectomy with positive nodes   Chest pain    Long-standing and atypical   Chronic anticoagulation 04/12/2011   GERD (gastroesophageal reflux disease)    hiatal hernia   Herpes zoster    Hyperlipidemia    Lipid profile in 11/2008:282, 176, 64, 201   Hypertension    diastolic dysfunction; normal CMet and TSH in 11/2009; normal CBC in 04/2010   Malignant neoplasm of breast (female), unspecified site 05/27/2013   right breast lumpectomy with positive node   Peripheral neuropathy    Polio 1946   at age 54   Rectal bleed    Renal insufficiency    Shortness of breath dyspnea    Vitreous hemorrhage of right eye ([place]) 10/23/2019    ROS:   All systems reviewed and negative except as noted in the HPI.  Past Surgical History:  Procedure Laterality Date   BREAST LUMPECTOMY WITH NEEDLE LOCALIZATION AND AXILLARY SENTINEL LYMPH NODE BX Right 05/27/2013   Procedure: BREAST LUMPECTOMY WITH NEEDLE LOCALIZATION AND AXILLARY SENTINEL LYMPH NODE BX;  Surgeon: Pedro Earls, MD;  Location: [place];  Service: General;  Laterality: Right;   [place]   COLONOSCOPY  02/22/2011   Procedure: COLONOSCOPY;  Surgeon: Rogene Houston, MD;  [facility];  Service: Endoscopy;  Laterality: N/A;; performed for scant hematochezia   DILATION AND CURETTAGE OF UTERUS     ESOPHAGOGASTRODUODENOSCOPY  02/22/2011   Procedure: ESOPHAGOGASTRODUODENOSCOPY (EGD);  Surgeon: Rogene Houston, MD;  [facility];  Service:  Endoscopy;  Laterality: N/A;   ESOPHAGOGASTRODUODENOSCOPY N/A 06/02/2014   Procedure: ESOPHAGOGASTRODUODENOSCOPY (EGD);  Surgeon: Rogene Houston, MD;  [facility];  Service: Endoscopy;  Laterality: N/A;  730   EYE SURGERY     both cataracts   MALONEY DILATION N/A 06/02/2014   Procedure: MALONEY DILATION;  Surgeon: Rogene Houston, MD;  [facility];  Service: Endoscopy;  Laterality: N/A;   MASTECTOMY  2001   Left;modified radical with lymph node dissection   PORT-A-CATH REMOVAL     pac in and out 2002     Family History  Problem Relation Age of Onset   Cancer Sister        lung   Cancer Brother        lipoma   Cancer Brother        kidney     Social History   Socioeconomic History   Marital status: Widowed    Spouse name: Not on file   Number of children: 2   Years of education: Not on file   Highest education level: Not on file  Occupational History   Occupation: homemaker  Tobacco Use   Smoking status: Never   Smokeless tobacco: Never  Vaping Use   Vaping Use: Never used  Substance and Sexual Activity   Alcohol use: No    Alcohol/week: 0.0 standard drinks   Drug use: No   Sexual activity: Not Currently    Birth control/protection: Post-menopausal  Other Topics Concern   Not on file  Social History Narrative   Not on file   Social Determinants of Health   Financial Resource Strain: Not on file  Food Insecurity: Not on file  Transportation Needs: Not on file  Physical Activity: Not on file  Stress: Not on file  Social Connections: Not on file  Intimate Partner Violence: Not on file     Ht 5' (1.524 m)    Wt 164 lb (74.4 kg)    BMI 32.03 kg/m   Physical Exam:  Well appearing NAD HEENT: Unremarkable Neck:  No JVD, no thyromegally Lymphatics:  No adenopathy Back:  No CVA tenderness Lungs:  Clear with no wheezes HEART:  Regular rate rhythm, no murmurs, no rubs, no clicks Abd:  soft, positive bowel sounds, no organomegally,  no rebound, no guarding Ext:  2 plus pulses, no edema, no cyanosis, no clubbing Skin:  No rashes no nodules Neuro:  CN II through XII intact, motor grossly intact  Assess/Plan:  1. Chronic diastolic heart failure - her symptoms are class 2. I encouraged her to avoid salty food and increase her physical activity. I considered adding Entresto but will hold off for now.    2. Perm atrial fib - her VR is  well controlled. She denies palpitations.   3. HTN - her bp is well controlled. No change in meds.   4. Obesity - she has a bmi of 32. She is encouraged to lose weight another 15 lbs.   Shelby Overlie Jarren Para,MD

## 2021-09-12 ENCOUNTER — Other Ambulatory Visit (HOSPITAL_COMMUNITY): Payer: Self-pay | Admitting: Internal Medicine

## 2021-09-12 DIAGNOSIS — Z1231 Encounter for screening mammogram for malignant neoplasm of breast: Secondary | ICD-10-CM

## 2021-09-13 DIAGNOSIS — I5033 Acute on chronic diastolic (congestive) heart failure: Secondary | ICD-10-CM | POA: Diagnosis not present

## 2021-09-13 DIAGNOSIS — Z79899 Other long term (current) drug therapy: Secondary | ICD-10-CM | POA: Diagnosis not present

## 2021-09-13 DIAGNOSIS — I10 Essential (primary) hypertension: Secondary | ICD-10-CM | POA: Diagnosis not present

## 2021-09-13 DIAGNOSIS — I4821 Permanent atrial fibrillation: Secondary | ICD-10-CM | POA: Diagnosis not present

## 2021-09-13 DIAGNOSIS — R7303 Prediabetes: Secondary | ICD-10-CM | POA: Diagnosis not present

## 2021-09-15 DIAGNOSIS — I10 Essential (primary) hypertension: Secondary | ICD-10-CM | POA: Diagnosis not present

## 2021-09-15 DIAGNOSIS — I5032 Chronic diastolic (congestive) heart failure: Secondary | ICD-10-CM | POA: Diagnosis not present

## 2021-09-21 ENCOUNTER — Ambulatory Visit (HOSPITAL_COMMUNITY)
Admission: RE | Admit: 2021-09-21 | Discharge: 2021-09-21 | Disposition: A | Discharge status: Home / Self Care | Payer: PPO | Source: Ambulatory Visit | Attending: Internal Medicine | Admitting: Internal Medicine

## 2021-09-21 ENCOUNTER — Other Ambulatory Visit: Payer: Self-pay

## 2021-09-21 DIAGNOSIS — Z1231 Encounter for screening mammogram for malignant neoplasm of breast: Secondary | ICD-10-CM | POA: Insufficient documentation

## 2021-09-26 ENCOUNTER — Other Ambulatory Visit: Payer: Self-pay

## 2021-09-26 ENCOUNTER — Ambulatory Visit (INDEPENDENT_AMBULATORY_CARE_PROVIDER_SITE_OTHER): Payer: PPO | Admitting: Gastroenterology

## 2021-09-26 ENCOUNTER — Encounter (INDEPENDENT_AMBULATORY_CARE_PROVIDER_SITE_OTHER): Payer: Self-pay | Admitting: Gastroenterology

## 2021-09-26 VITALS — BP 118/63 | HR 84 | Temp 97.7°F | Ht 60.0 in | Wt 164.4 lb

## 2021-09-26 DIAGNOSIS — K449 Diaphragmatic hernia without obstruction or gangrene: Secondary | ICD-10-CM | POA: Diagnosis not present

## 2021-09-26 DIAGNOSIS — K219 Gastro-esophageal reflux disease without esophagitis: Secondary | ICD-10-CM | POA: Diagnosis not present

## 2021-09-26 DIAGNOSIS — K649 Unspecified hemorrhoids: Secondary | ICD-10-CM

## 2021-09-26 MED ORDER — HYDROCORTISONE (PERIANAL) 2.5 % EX CREA: 1.0000 "application " | TOPICAL_CREAM | Freq: Three times a day (TID) | CUTANEOUS | 1 refills | Status: AC

## 2021-09-26 MED ORDER — PANTOPRAZOLE SODIUM 40 MG PO TBEC: DELAYED_RELEASE_TABLET | ORAL | 3 refills | Status: DC

## 2021-09-26 NOTE — Patient Instructions (Signed)
Please continue your pantoprazole '40mg'$  once daily ?Continue to avoid greasy, spicy, tomato based foods, carbonated drinks and chocolate as these can worsen reflux symptoms, make sure you are staying upright 2-3 hours after eating, prior to lying down ?I am sending anusol cream for you to use three times a day on your hemorrhoids, you can do this for 2 weeks then use as needed after that. You can also use tucks or preparation H pads to help with itching. If you are able to do sitz baths, they will also help with discomfort. Please let me know if symptoms not improved in the next few weeks. ? ?Follow up 1 year ?

## 2021-09-26 NOTE — Progress Notes (Addendum)
? ?Referring Provider: Asencion Noble, MD ?Primary Care Physician:  Asencion Noble, MD ?Primary GI Physician: Laural Golden ? ?Chief Complaint  ?Patient presents with  ? Gastroesophageal Reflux  ?  One year follow up on GERD. Patient states she is having problems with rectal bleeding and hemorrhoids. Having rectal pain.   ? ?HPI:   ?[name] is a 88 y.o. female with past medical history of GERD, hiatal hernia.  ? ?Patient presenting today for follow up of GERD. Last seen 07/13/20. ? ?At last visit she was doing well on pantoprazole with tums needed on occasion, had decreased her eating to 5-6 small meals per day as she had some postprandial bloating, thought secondary to presence of large hiatal hernia.  ? ?Labs done with PCP on 09/13/21 with hgb of 13.2. ? ?Patient states that she some rectal bleeding, stinging and burning in her rectal area after having a BM. She reports that she has a small amount of stool that comes out almost every time she urinates. She reports that she has a normal BM upon waking in the morning. She has rectal bleeding on occasion, sometimes she will notice BRB on the paper after wiping and sometimes will notice blood dripping from her rectum. Reports this will usually stop after a few minutes. She states that she has sometimes noticed rectal bleeding even after drying her self off out of the shower. She denies any issues with constipation. Stools are usually softer but more formed. She denies any melena. She does eat smaller meals due to presence of large hiatal hernia. She has lost about 5 pounds over the past year. Does use preparation H with some relief. She tried proctocream in the past but felt that this caused burning.  ? ?GERD is well maintained as long she takes her PPI, denies any issues with dysphagia or odynophagia. Has some continued post prandial bloating.  ? ?Last Colonoscopy:2012Prep excellent. ?Normal terminal ileum ?Few small diverticula at sigmoid colon. ?Moderate size external  hemorrhoids ?Last Endoscopy:2015No evidence of erosive esophagitis or distal esophageal stricture as suspected based on esophagogram. Therefore esophagus was not dilated. ?Large sliding hiatal hernia with organoaxial rotation and difficulty encountered in passing scope from proximal into distal half of the stomach. No evidence of pyloric stenosis or peptic ulcer disease. ?  ?Recommendations:  ? ? ?Past Medical History:  ?Diagnosis Date  ? Anxiety   ? Arteriosclerotic cardiovascular disease (ASCVD)   ?  cath Feb '07- no obstructive dz - 20% proximal LAD only; EF 65%; possible coronary artery spasm  ? Atrial fibrillation ([place])   ? Paroxysmal on event recorder in 2009; regular supraventricular tachycardia at a rate of 150 also identified on that study representing either atrial flutter or PSVT; onset of persistent AF in 03/2011  ? Carcinoma of breast ([place]) 2001  ? Left mastectomy with positive nodes  ? Chest pain   ? Long-standing and atypical  ? Chronic anticoagulation 04/12/2011  ? GERD (gastroesophageal reflux disease)   ? hiatal hernia  ? Herpes zoster   ? Hyperlipidemia   ? Lipid profile in 11/2008:282, 176, 64, 201  ? Hypertension   ? diastolic dysfunction; normal CMet and TSH in 11/2009; normal CBC in 04/2010  ? Malignant neoplasm of breast (female), unspecified site 05/27/2013  ? right breast lumpectomy with positive node  ? Peripheral neuropathy   ? Polio 1946  ? at age 74  ? Rectal bleed   ? Renal insufficiency   ? Shortness of breath dyspnea   ?  Vitreous hemorrhage of right eye ([place]) 10/23/2019  ? ? ?Past Surgical History:  ?Procedure Laterality Date  ? BREAST LUMPECTOMY WITH NEEDLE LOCALIZATION AND AXILLARY SENTINEL LYMPH NODE BX Right 05/27/2013  ? Procedure: BREAST LUMPECTOMY WITH NEEDLE LOCALIZATION AND AXILLARY SENTINEL LYMPH NODE BX;  Surgeon: Pedro Earls, MD;  Location: [facility];  Service: General;  Laterality: Right;  ? BREAST SURGERY    ? CHOLECYSTECTOMY  1999  ? COLONOSCOPY   02/22/2011  ? Procedure: COLONOSCOPY;  Surgeon: Rogene Houston, MD;  [facility];  Service: Endoscopy;  Laterality: N/A;; performed for scant hematochezia  ? DILATION AND CURETTAGE OF UTERUS    ? ESOPHAGOGASTRODUODENOSCOPY  02/22/2011  ? Procedure: ESOPHAGOGASTRODUODENOSCOPY (EGD);  Surgeon: Rogene Houston, MD;  [facility];  Service: Endoscopy;  Laterality: N/A;  ? ESOPHAGOGASTRODUODENOSCOPY N/A 06/02/2014  ? Procedure: ESOPHAGOGASTRODUODENOSCOPY (EGD);  Surgeon: Rogene Houston, MD;  [facility];  Service: Endoscopy;  Laterality: N/A;  730  ? EYE SURGERY    ? both cataracts  ? MALONEY DILATION N/A 06/02/2014  ? Procedure: MALONEY DILATION;  Surgeon: Rogene Houston, MD;  [facility];  Service: Endoscopy;  Laterality: N/A;  ? MASTECTOMY  2001  ? Left;modified radical with lymph node dissection  ? PORT-A-CATH REMOVAL    ? pac in and out 2002  ? ? ?Current Outpatient Medications  ?Medication Sig Dispense Refill  ? acetaminophen (TYLENOL) 500 MG tablet Take 500 mg by mouth every 6 (six) hours as needed. Patient reports that she may take 1 or 2 times a day for hip pain.    ? albuterol (VENTOLIN HFA) 108 (90 Base) MCG/ACT inhaler Inhale 2 puffs into the lungs every 4 (four) hours as needed.    ? carvedilol (COREG) 12.5 MG tablet Take 12.5 mg by mouth 2 (two) times daily with a meal.     ? dorzolamide-timolol (COSOPT) 22.3-6.8 MG/ML ophthalmic solution Place 1 drop into the right eye 2 (two) times daily.     ? ELIQUIS 5 MG TABS tablet TAKE (1) TABLET BY MOUTH TWICE DAILY FOR BLOOD THINNER. 60 tablet 5  ? furosemide (LASIX) 40 MG tablet TAKE ONE TABLET BY MOUTH ONCE DAILY. 90 tablet 3  ? Latanoprostene Bunod 0.024 % SOLN Place 1 drop into the right eye at bedtime.     ? losartan (COZAAR) 25 MG tablet Take 1 tablet (25 mg total) by mouth 2 (two) times daily. 90 tablet 3  ? Multiple Vitamins-Minerals (VISION FORMULA EYE HEALTH PO) Take 1 tablet by mouth 2 (two) times daily.      ? pantoprazole (PROTONIX) 40 MG tablet TAKE ONE TABLET BY MOUTH DAILY BEFORE BREAKFAST FOR ACID REFLUX. 90 tablet 0  ? pramoxine-hydrocortisone (PROCTOCREAM-HC) 1-1 % rectal cream Place 1 application rectally 2 (two) times daily as needed for hemorrhoids or anal itching. 30 g 1  ? ?No current facility-administered medications for this visit.  ? ? ?Allergies as of 09/26/2021 - Review Complete 09/26/2021  ?Allergen Reaction Noted  ? Atorvastatin Other (See Comments) 05/08/2011  ? Hydrocodone Other (See Comments)   ? ? ?Family History  ?Problem Relation Age of Onset  ? Cancer Sister   ?     lung  ? Cancer Brother   ?     lipoma  ? Cancer Brother   ?     kidney  ? ? ?Social History  ? ?Socioeconomic History  ? Marital status: Widowed  ?  Spouse name:  Not on file  ? Number of children: 2  ? Years of education: Not on file  ? Highest education level: Not on file  ?Occupational History  ? Occupation: homemaker  ?Tobacco Use  ? Smoking status: Never  ?  Passive exposure: Never  ? Smokeless tobacco: Never  ?Vaping Use  ? Vaping Use: Never used  ?Substance and Sexual Activity  ? Alcohol use: No  ?  Alcohol/week: 0.0 standard drinks  ? Drug use: No  ? Sexual activity: Not Currently  ?  Birth control/protection: Post-menopausal  ?Other Topics Concern  ? Not on file  ?Social History Narrative  ? Not on file  ? ?Social Determinants of Health  ? ?Financial Resource Strain: Not on file  ?Food Insecurity: Not on file  ?Transportation Needs: Not on file  ?Physical Activity: Not on file  ?Stress: Not on file  ?Social Connections: Not on file  ? ?Review of systems ?General: negative for malaise, night sweats, fever, chills, weight loss ?Neck: Negative for lumps, goiter, pain and significant neck swelling ?Resp: Negative for cough, wheezing, dyspnea at rest ?CV: Negative for chest pain, leg swelling, palpitations, orthopnea ?GI: denies melena, nausea, vomiting, diarrhea, constipation, dysphagia, odyonophagia, early satiety or  unintentional weight loss. +hematochezia +rectal burning +rectal itching ?MSK: Negative for joint pain or swelling, back pain, and muscle pain. ?Derm: Negative for itching or rash ?Psych: Denies depression, anxiety, memor

## 2021-09-29 ENCOUNTER — Other Ambulatory Visit: Payer: Self-pay

## 2021-09-29 ENCOUNTER — Ambulatory Visit (INDEPENDENT_AMBULATORY_CARE_PROVIDER_SITE_OTHER): Payer: PPO | Admitting: Ophthalmology

## 2021-09-29 ENCOUNTER — Encounter (INDEPENDENT_AMBULATORY_CARE_PROVIDER_SITE_OTHER): Payer: Self-pay | Admitting: Ophthalmology

## 2021-09-29 DIAGNOSIS — H353211 Exudative age-related macular degeneration, right eye, with active choroidal neovascularization: Secondary | ICD-10-CM

## 2021-09-29 DIAGNOSIS — H353114 Nonexudative age-related macular degeneration, right eye, advanced atrophic with subfoveal involvement: Secondary | ICD-10-CM

## 2021-09-29 DIAGNOSIS — H353231 Exudative age-related macular degeneration, bilateral, with active choroidal neovascularization: Secondary | ICD-10-CM

## 2021-09-29 DIAGNOSIS — H353221 Exudative age-related macular degeneration, left eye, with active choroidal neovascularization: Secondary | ICD-10-CM | POA: Diagnosis not present

## 2021-09-29 MED ORDER — BEVACIZUMAB 2.5 MG/0.1ML IZ SOSY
2.5000 mg | PREFILLED_SYRINGE | INTRAVITREAL | Status: AC | PRN
  Administered 2021-09-29: 2.5 mg via INTRAVITREAL

## 2021-09-29 NOTE — Assessment & Plan Note (Signed)
OS vastly improved and now atrophic changes, at 6 weeks, repeat injection OS today to maintain and consider dilated examination again in 7-week ?

## 2021-09-29 NOTE — Assessment & Plan Note (Signed)
OD, with subfoveal pigment epithelial detachment yet still controlled vascularized PED and CNVM at 6 weeks.  Stable acuity. ?

## 2021-09-29 NOTE — Progress Notes (Signed)
? ? ?09/29/2021 ? ?  ? ?CHIEF COMPLAINT ?Patient presents for  ?Chief Complaint  ?Patient presents with  ? Macular Degeneration  ? ? ? ? ?HISTORY OF PRESENT ILLNESS: ?[name] is a 88 y.o. female who presents to the clinic today for:  ? ?HPI   ?6 weeks fu ou oct avastin ou. ?Pt states no changes in vision. ?Pt denies floaters and FOL but states sometimes sees a yellow star when shes looking out.  ?Last edited by Silvestre Moment on 09/29/2021  1:56 PM.  ?  ? ? ?Referring physician: ?Asencion Noble, MD ?[address] ?[place],  [place] [address] ? ?HISTORICAL INFORMATION:  ? ?Selected notes from the [facility] ?  ?   ? ?CURRENT MEDICATIONS: ?Current Outpatient Medications (Ophthalmic Drugs)  ?Medication Sig  ? dorzolamide-timolol (COSOPT) 22.3-6.8 MG/ML ophthalmic solution Place 1 drop into the right eye 2 (two) times daily.   ? Latanoprostene Bunod 0.024 % SOLN Place 1 drop into the right eye at bedtime.   ? ?No current facility-administered medications for this visit. (Ophthalmic Drugs)  ? ?Current Outpatient Medications (Other)  ?Medication Sig  ? acetaminophen (TYLENOL) 500 MG tablet Take 500 mg by mouth every 6 (six) hours as needed. Patient reports that she may take 1 or 2 times a day for hip pain.  ? albuterol (VENTOLIN HFA) 108 (90 Base) MCG/ACT inhaler Inhale 2 puffs into the lungs every 4 (four) hours as needed.  ? carvedilol (COREG) 12.5 MG tablet Take 12.5 mg by mouth 2 (two) times daily with a meal.   ? ELIQUIS 5 MG TABS tablet TAKE (1) TABLET BY MOUTH TWICE DAILY FOR BLOOD THINNER.  ? furosemide (LASIX) 40 MG tablet TAKE ONE TABLET BY MOUTH ONCE DAILY.  ? hydrocortisone (ANUSOL-HC) 2.5 % rectal cream Place 1 application. rectally 3 (three) times daily. USE THREE TIMES A DAY X2 WEEKS THEN AS NEEDED  ? losartan (COZAAR) 25 MG tablet Take 1 tablet (25 mg total) by mouth 2 (two) times daily.  ? Multiple Vitamins-Minerals (VISION FORMULA EYE HEALTH PO) Take 1 tablet by mouth 2 (two) times daily.   ?  pantoprazole (PROTONIX) 40 MG tablet TAKE ONE TABLET BY MOUTH DAILY BEFORE BREAKFAST FOR ACID REFLUX.  ? pramoxine-hydrocortisone (PROCTOCREAM-HC) 1-1 % rectal cream Place 1 application rectally 2 (two) times daily as needed for hemorrhoids or anal itching.  ? ?No current facility-administered medications for this visit. (Other)  ? ? ? ? ?REVIEW OF SYSTEMS: ?ROS   ?Negative for: Constitutional, Gastrointestinal, Neurological, Skin, Genitourinary, Musculoskeletal, HENT, Endocrine, Cardiovascular, Eyes, Respiratory, Psychiatric, Allergic/Imm, Heme/Lymph ?Last edited by Silvestre Moment on 09/29/2021  1:56 PM.  ?  ? ? ? ?ALLERGIES ?Allergies  ?Allergen Reactions  ? Atorvastatin Other (See Comments)  ?  Myalgias  ? Hydrocodone Other (See Comments)  ?  GI distress.  ? ? ?PAST MEDICAL HISTORY ?Past Medical History:  ?Diagnosis Date  ? Anxiety   ? Arteriosclerotic cardiovascular disease (ASCVD)   ?  cath Feb '07- no obstructive dz - 20% proximal LAD only; EF 65%; possible coronary artery spasm  ? Atrial fibrillation ([place])   ? Paroxysmal on event recorder in 2009; regular supraventricular tachycardia at a rate of 150 also identified on that study representing either atrial flutter or PSVT; onset of persistent AF in 03/2011  ? Carcinoma of breast ([place]) 2001  ? Left mastectomy with positive nodes  ? Chest pain   ? Long-standing and atypical  ? Chronic anticoagulation 04/12/2011  ? GERD (  gastroesophageal reflux disease)   ? hiatal hernia  ? Herpes zoster   ? Hyperlipidemia   ? Lipid profile in 11/2008:282, 176, 64, 201  ? Hypertension   ? diastolic dysfunction; normal CMet and TSH in 11/2009; normal CBC in 04/2010  ? Malignant neoplasm of breast (female), unspecified site 05/27/2013  ? right breast lumpectomy with positive node  ? Peripheral neuropathy   ? Polio 1946  ? at age 11  ? Rectal bleed   ? Renal insufficiency   ? Shortness of breath dyspnea   ? Vitreous hemorrhage of right eye ([place]) 10/23/2019  ? ?Past Surgical History:  ?Procedure  Laterality Date  ? BREAST LUMPECTOMY WITH NEEDLE LOCALIZATION AND AXILLARY SENTINEL LYMPH NODE BX Right 05/27/2013  ? Procedure: BREAST LUMPECTOMY WITH NEEDLE LOCALIZATION AND AXILLARY SENTINEL LYMPH NODE BX;  Surgeon: Pedro Earls, MD;  Location: [facility];  Service: General;  Laterality: Right;  ? BREAST SURGERY    ? CHOLECYSTECTOMY  1999  ? COLONOSCOPY  02/22/2011  ? Procedure: COLONOSCOPY;  Surgeon: Rogene Houston, MD;  [facility];  Service: Endoscopy;  Laterality: N/A;; performed for scant hematochezia  ? DILATION AND CURETTAGE OF UTERUS    ? ESOPHAGOGASTRODUODENOSCOPY  02/22/2011  ? Procedure: ESOPHAGOGASTRODUODENOSCOPY (EGD);  Surgeon: Rogene Houston, MD;  [facility];  Service: Endoscopy;  Laterality: N/A;  ? ESOPHAGOGASTRODUODENOSCOPY N/A 06/02/2014  ? Procedure: ESOPHAGOGASTRODUODENOSCOPY (EGD);  Surgeon: Rogene Houston, MD;  [facility];  Service: Endoscopy;  Laterality: N/A;  730  ? EYE SURGERY    ? both cataracts  ? MALONEY DILATION N/A 06/02/2014  ? Procedure: MALONEY DILATION;  Surgeon: Rogene Houston, MD;  [facility];  Service: Endoscopy;  Laterality: N/A;  ? MASTECTOMY  2001  ? Left;modified radical with lymph node dissection  ? PORT-A-CATH REMOVAL    ? pac in and out 2002  ? ? ?FAMILY HISTORY ?Family History  ?Problem Relation Age of Onset  ? Cancer Sister   ?     lung  ? Cancer Brother   ?     lipoma  ? Cancer Brother   ?     kidney  ? ? ?SOCIAL HISTORY ?Social History  ? ?Tobacco Use  ? Smoking status: Never  ?  Passive exposure: Never  ? Smokeless tobacco: Never  ?Vaping Use  ? Vaping Use: Never used  ?Substance Use Topics  ? Alcohol use: No  ?  Alcohol/week: 0.0 standard drinks  ? Drug use: No  ? ?  ? ?  ? ?OPHTHALMIC EXAM: ? ?Base Eye Exam   ? ? Visual Acuity (ETDRS)   ? ?   Right Left  ? Dist [place] 20/50 -1 20/100  ? Dist ph [place] NI NI  ? ?  ?  ? ? Tonometry (Tonopen, 2:04 PM)   ? ?   Right Left  ? Pressure 15 16  ? ?  ?  ? ?  Pupils   ? ?   Pupils APD  ? Right PERRL None  ? Left PERRL None  ? ?  ?  ? ? Visual Fields   ? ?   Left Right  ?  Full Full  ? ?  ?  ? ? Extraocular Movement   ? ?   Right Left  ?  Full Full  ? ?  ?  ? ? Neuro/Psych   ? ? Oriented x3: Yes  ? Mood/Affect: Normal  ? ?  ?  ? ?  Dilation   ? ? Both eyes: 1.0% Mydriacyl, 2.5% Phenylephrine @ 2:04 PM  ? ?  ?  ? ?  ? ?Slit Lamp and Fundus Exam   ? ? External Exam   ? ?   Right Left  ? External Normal Normal  ? ?  ?  ? ? Slit Lamp Exam   ? ?   Right Left  ? Lids/Lashes Normal Normal  ? Conjunctiva/Sclera White and quiet White and quiet  ? Cornea Clear Clear  ? Anterior Chamber Deep and quiet Deep and quiet  ? Iris Round and reactive Round and reactive  ? Lens Posterior chamber intraocular lens, Open posterior capsule Posterior chamber intraocular lens  ? Anterior Vitreous Normal Normal  ? ?  ?  ? ? Fundus Exam   ? ?   Right Left  ? Posterior Vitreous clear , vitrectomized Posterior vitreous detachment  ? Disc Normal Normal  ? C/D Ratio 0.35 0.3  ? Macula Geographic atrophy, Intermediate age related macular degeneration, Subretinal hemorrhage thin slightly smaller, paramacular Disciform scar, Drusen, no exudates, no hemorrhage, no macular thickening, Mottling, Retinal pigment epithelial mottling, Atrophy, Retinal pigment epithelial atrophy, Age related macular degeneration, Early age related macular degeneration, Geographic atrophy near the FAZ, Pigmented atrophy, Hard drusen,  ? Vessels Normal Normal  ? Periphery Normal, no holes Normal, no holes  ? ?  ?  ? ?  ? ? ?IMAGING AND PROCEDURES  ?Imaging and Procedures for 09/29/21 ? ?OCT, Retina - OU - Both Eyes   ? ?   ?Right Eye ?Quality was borderline. Scan locations included subfoveal. Central Foveal Thickness: 291. Progression has been stable. Findings include abnormal foveal contour, pigment epithelial detachment.  ? ?Left Eye ?Quality was good. Scan locations included subfoveal. Central Foveal Thickness: 365. Progression  has been stable. Findings include abnormal foveal contour, cystoid macular edema, intraretinal fluid.  ? ?Notes ?Much less overall thickening and subretinal fluid and edema and intraretinal fluid post inje

## 2021-09-29 NOTE — Assessment & Plan Note (Signed)
OD, limits acuity ?

## 2021-11-08 ENCOUNTER — Encounter (INDEPENDENT_AMBULATORY_CARE_PROVIDER_SITE_OTHER): Payer: Self-pay

## 2021-11-08 DIAGNOSIS — H40002 Preglaucoma, unspecified, left eye: Secondary | ICD-10-CM | POA: Diagnosis not present

## 2021-11-08 DIAGNOSIS — H401112 Primary open-angle glaucoma, right eye, moderate stage: Secondary | ICD-10-CM | POA: Diagnosis not present

## 2021-11-08 DIAGNOSIS — Z961 Presence of intraocular lens: Secondary | ICD-10-CM | POA: Diagnosis not present

## 2021-11-11 ENCOUNTER — Encounter: Payer: Self-pay | Admitting: Urology

## 2021-11-11 ENCOUNTER — Ambulatory Visit (INDEPENDENT_AMBULATORY_CARE_PROVIDER_SITE_OTHER): Payer: PPO | Admitting: Urology

## 2021-11-11 VITALS — BP 105/65 | HR 73

## 2021-11-11 DIAGNOSIS — R300 Dysuria: Secondary | ICD-10-CM

## 2021-11-11 DIAGNOSIS — N3281 Overactive bladder: Secondary | ICD-10-CM | POA: Diagnosis not present

## 2021-11-11 LAB — URINALYSIS, ROUTINE W REFLEX MICROSCOPIC
Bilirubin, UA: NEGATIVE
Glucose, UA: NEGATIVE
Ketones, UA: NEGATIVE
Nitrite, UA: NEGATIVE
RBC, UA: NEGATIVE
Specific Gravity, UA: 1.02 (ref 1.005–1.030)
Urobilinogen, Ur: 4 mg/dL — ABNORMAL HIGH (ref 0.2–1.0)
pH, UA: 7 (ref 5.0–7.5)

## 2021-11-11 LAB — MICROSCOPIC EXAMINATION
RBC, Urine: NONE SEEN /hpf (ref 0–2)
Renal Epithel, UA: NONE SEEN /hpf

## 2021-11-11 MED ORDER — GEMTESA 75 MG PO TABS: 1.0000 | ORAL_TABLET | Freq: Every day | ORAL | 0 refills | Status: DC

## 2021-11-11 NOTE — Progress Notes (Signed)
post void residual=6 

## 2021-11-11 NOTE — Patient Instructions (Signed)

## 2021-11-11 NOTE — Progress Notes (Signed)
? ?11/11/2021 ?12:08 PM  ? ?[name] ?February 16, 1932 ?[id number] ? ?Referring provider: Asencion Noble, MD ?[address] ?[place],  [place] [address] ? ?dysuria ? ? ?HPI: ?Ms [name] is a 88yo here for evaluation of dysuria. Starting 6 months she noted terminal dysuria. She denies any worsening urinary urgency, urinary frequency or straining to urinate. Urine stream strong. No hematuria. UA today shows few bacteria but no RBCs.  She has occasional suprapubic pressure. She takes lasix 3 times a week for her CHF. No tobacco abuse hx.  ? ?Here records from [facility] are as follows: ?I have pain or burning with urination. HPI: [name] is a 88 year-old female established patient who is here for pain or burning while urinating.??She does have pain or burning when she urinates. She first noticed the symptom approximately 08/08/2017. ??She does not urinate more frequently than once every 4 hours in the daytime. She does not have to strain or bear down to start her urinary stream. She does not dribble at the end of urination. She does have an abnormal sensation when needing to urinate. She usually gets up at night to urinate 4 times. ??She has not previously had an indwelling catheter in for more than two weeks at a time. ??08/29/2017: 3 weeks ago she developed dysuria with associated urgency, frequency and nocturia 4x. she took AZO which relieved the burning but it returns soon after stopping the AZO. ??10/10/2017: She notes no improvement in the dysuria since starting the ceftin. She uses AZO prn which improves the burning ??11/21/2017: Urine culture shows multiple species. AZO improved dysuria ??01/02/2018: NO dysuria in 4 weeks. She also was treated with 1 week of macrodantin ??06/26/2018: She has rare dysuria since last visit. Since starting '40mg'$  lasix she has noted worsening dysuria. ??CC: I have an overactive bladder. HPI: She is not on any medications forher over active bladder symptoms. She does have urgency. She does  have problems getting to the bathroom in time after she has the urge to urinate. She does not urinate more frequently than once every 4 hours in the daytime. ??She is not satisfied with the way she is voiding. She does wear protective pads. She wears 2-3 pads per day. She gets up at night to urinate 4 times. She is not having problems with emptying her bladder well. ??04/18/2017: She was tried on mirabegron '25mg'$  daily and noted a slight improvement in her urgency and nocturia. She has glaucoma. ??05/30/2017: She took mirabegron '50mg'$  for only a few days and then stopped it due issues starting a new BP drug. ??08/29/2017: She is doing well on mirabegron '50mg'$  daily. ??10/09/2017: She stopped the mirabegron and notes she has urgency, frequency. She has nocturia 3-4x. She has glaucoma. ??01/02/2018: She notes stable urgency and urge incontinence. She has failed mirabegron. she has glaucoma and cannot have anticholinergics. ??06/26/2018: Nocturia decreased to 2x since starting lasix '40mg'$  in the morning. ? ? ? ?PMH: ?Past Medical History:  ?Diagnosis Date  ? Anxiety   ? Arteriosclerotic cardiovascular disease (ASCVD)   ?  cath Feb '07- no obstructive dz - 20% proximal LAD only; EF 65%; possible coronary artery spasm  ? Atrial fibrillation ([place])   ? Paroxysmal on event recorder in 2009; regular supraventricular tachycardia at a rate of 150 also identified on that study representing either atrial flutter or PSVT; onset of persistent AF in 03/2011  ? Carcinoma of breast ([place]) 2001  ? Left mastectomy with positive nodes  ? Chest pain   ?  Long-standing and atypical  ? Chronic anticoagulation 04/12/2011  ? GERD (gastroesophageal reflux disease)   ? hiatal hernia  ? Herpes zoster   ? Hyperlipidemia   ? Lipid profile in 11/2008:282, 176, 64, 201  ? Hypertension   ? diastolic dysfunction; normal CMet and TSH in 11/2009; normal CBC in 04/2010  ? Malignant neoplasm of breast (female), unspecified site 05/27/2013  ? right breast lumpectomy  with positive node  ? Peripheral neuropathy   ? Polio 1946  ? at age 61  ? Rectal bleed   ? Renal insufficiency   ? Shortness of breath dyspnea   ? Vitreous hemorrhage of right eye ([place]) 10/23/2019  ? ? ?Surgical History: ?Past Surgical History:  ?Procedure Laterality Date  ? BREAST LUMPECTOMY WITH NEEDLE LOCALIZATION AND AXILLARY SENTINEL LYMPH NODE BX Right 05/27/2013  ? Procedure: BREAST LUMPECTOMY WITH NEEDLE LOCALIZATION AND AXILLARY SENTINEL LYMPH NODE BX;  Surgeon: Pedro Earls, MD;  Location: [facility];  Service: General;  Laterality: Right;  ? BREAST SURGERY    ? CHOLECYSTECTOMY  1999  ? COLONOSCOPY  02/22/2011  ? Procedure: COLONOSCOPY;  Surgeon: Rogene Houston, MD;  [facility];  Service: Endoscopy;  Laterality: N/A;; performed for scant hematochezia  ? DILATION AND CURETTAGE OF UTERUS    ? ESOPHAGOGASTRODUODENOSCOPY  02/22/2011  ? Procedure: ESOPHAGOGASTRODUODENOSCOPY (EGD);  Surgeon: Rogene Houston, MD;  [facility];  Service: Endoscopy;  Laterality: N/A;  ? ESOPHAGOGASTRODUODENOSCOPY N/A 06/02/2014  ? Procedure: ESOPHAGOGASTRODUODENOSCOPY (EGD);  Surgeon: Rogene Houston, MD;  [facility];  Service: Endoscopy;  Laterality: N/A;  730  ? EYE SURGERY    ? both cataracts  ? MALONEY DILATION N/A 06/02/2014  ? Procedure: MALONEY DILATION;  Surgeon: Rogene Houston, MD;  [facility];  Service: Endoscopy;  Laterality: N/A;  ? MASTECTOMY  2001  ? Left;modified radical with lymph node dissection  ? PORT-A-CATH REMOVAL    ? pac in and out 2002  ? ? ?Home Medications:  ?Allergies as of 11/11/2021   ? ?   Reactions  ? Atorvastatin Other (See Comments)  ? Myalgias  ? Hydrocodone Other (See Comments)  ? GI distress.  ? ?  ? ?  ?Medication List  ?  ? ?  ? Accurate as of Nov 11, 2021 12:08 PM. If you have any questions, ask your nurse or doctor.  ?  ?  ? ?  ? ?acetaminophen 500 MG tablet ?Commonly known as: TYLENOL ?Take 500 mg by mouth every 6 (six) hours  as needed. Patient reports that she may take 1 or 2 times a day for hip pain. ?  ?albuterol 108 (90 Base) MCG/ACT inhaler ?Commonly known as: VENTOLIN HFA ?Inhale 2 puffs into the lungs every 4 (four) hours as needed. ?  ?carvedilol 12.5 MG tablet ?Commonly known as: COREG ?Take 12.5 mg by mouth 2 (two) times daily with a meal. ?  ?dorzolamide-timolol 22.3-6.8 MG/ML ophthalmic solution ?Commonly known as: COSOPT ?Place 1 drop into the right eye 2 (two) times daily. ?  ?Eliquis 5 MG Tabs tablet ?Generic drug: apixaban ?TAKE (1) TABLET BY MOUTH TWICE DAILY FOR BLOOD THINNER. ?  ?furosemide 40 MG tablet ?Commonly known as: LASIX ?TAKE ONE TABLET BY MOUTH ONCE DAILY. ?  ?hydrocortisone 2.5 % rectal cream ?Commonly known as: ANUSOL-HC ?Place 1 application. rectally 3 (three) times daily. USE THREE TIMES A DAY X2 WEEKS THEN AS NEEDED ?  ?Latanoprostene Bunod 0.024 % Soln ?Place  1 drop into the right eye at bedtime. ?  ?losartan 25 MG tablet ?Commonly known as: COZAAR ?Take 1 tablet (25 mg total) by mouth 2 (two) times daily. ?  ?pantoprazole 40 MG tablet ?Commonly known as: PROTONIX ?TAKE ONE TABLET BY MOUTH DAILY BEFORE BREAKFAST FOR ACID REFLUX. ?  ?pramoxine-hydrocortisone 1-1 % rectal cream ?Commonly known as: PROCTOCREAM-HC ?Place 1 application rectally 2 (two) times daily as needed for hemorrhoids or anal itching. ?  ?VISION FORMULA EYE HEALTH PO ?Take 1 tablet by mouth 2 (two) times daily. ?  ? ?  ? ? ?Allergies:  ?Allergies  ?Allergen Reactions  ? Atorvastatin Other (See Comments)  ?  Myalgias  ? Hydrocodone Other (See Comments)  ?  GI distress.  ? ? ?Family History: ?Family History  ?Problem Relation Age of Onset  ? Cancer Sister   ?     lung  ? Cancer Brother   ?     lipoma  ? Cancer Brother   ?     kidney  ? ? ?Social History:  reports that she has never smoked. She has never been exposed to tobacco smoke. She has never used smokeless tobacco. She reports that she does not drink alcohol and does not use  drugs. ? ?ROS: ?All other review of systems were reviewed and are negative except what is noted above in HPI ? ?Physical Exam: ?BP 105/65   Pulse 73   ?Constitutional:  Alert and oriented, No acute distress. ?H

## 2021-11-14 LAB — CYTOLOGY, URINE

## 2021-11-17 ENCOUNTER — Ambulatory Visit (INDEPENDENT_AMBULATORY_CARE_PROVIDER_SITE_OTHER): Payer: PPO | Admitting: Ophthalmology

## 2021-11-17 ENCOUNTER — Encounter (INDEPENDENT_AMBULATORY_CARE_PROVIDER_SITE_OTHER): Payer: Self-pay | Admitting: Ophthalmology

## 2021-11-17 DIAGNOSIS — H353231 Exudative age-related macular degeneration, bilateral, with active choroidal neovascularization: Secondary | ICD-10-CM | POA: Diagnosis not present

## 2021-11-17 DIAGNOSIS — H353221 Exudative age-related macular degeneration, left eye, with active choroidal neovascularization: Secondary | ICD-10-CM

## 2021-11-17 DIAGNOSIS — H353211 Exudative age-related macular degeneration, right eye, with active choroidal neovascularization: Secondary | ICD-10-CM | POA: Diagnosis not present

## 2021-11-17 MED ORDER — BEVACIZUMAB 2.5 MG/0.1ML IZ SOSY
2.5000 mg | PREFILLED_SYRINGE | INTRAVITREAL | Status: AC | PRN
  Administered 2021-11-17: 2.5 mg via INTRAVITREAL

## 2021-11-17 NOTE — Assessment & Plan Note (Signed)
OS overall improved on therapy yet still persistent subretinal fluid intraretinal fluid temporal to FAZ at 7-week interval today, will need to repeat injection today and follow-up next in 6 weeks ?

## 2021-11-17 NOTE — Assessment & Plan Note (Signed)
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  Will need to maintain interval follow-up examination next in order to prevent progression in this eye with the best acuity. ?

## 2021-11-17 NOTE — Progress Notes (Signed)
? ? ?11/17/2021 ? ?  ? ?CHIEF COMPLAINT ?Patient presents for  ?Chief Complaint  ?Patient presents with  ? Macular Degeneration  ? ? ? ? ?HISTORY OF PRESENT ILLNESS: ?[name] is a 88 y.o. female who presents to the clinic today for:  ? ?HPI   ?7 weeks for DILATE OU, AVASTIN OCT, OD, OS. ?Pt states no changes in vision. ?Pt denies floaters and FOL. ? ?Last edited by Silvestre Moment on 11/17/2021  2:39 PM.  ?  ? ? ?Referring physician: ?Asencion Noble, MD ?[address] ?[place],  [place] [address] ? ?HISTORICAL INFORMATION:  ? ?Selected notes from the [facility] ?  ?   ? ?CURRENT MEDICATIONS: ?Current Outpatient Medications (Ophthalmic Drugs)  ?Medication Sig  ? dorzolamide-timolol (COSOPT) 22.3-6.8 MG/ML ophthalmic solution Place 1 drop into the right eye 2 (two) times daily.   ? Latanoprostene Bunod 0.024 % SOLN Place 1 drop into the right eye at bedtime.   ? ?No current facility-administered medications for this visit. (Ophthalmic Drugs)  ? ?Current Outpatient Medications (Other)  ?Medication Sig  ? acetaminophen (TYLENOL) 500 MG tablet Take 500 mg by mouth every 6 (six) hours as needed. Patient reports that she may take 1 or 2 times a day for hip pain.  ? albuterol (VENTOLIN HFA) 108 (90 Base) MCG/ACT inhaler Inhale 2 puffs into the lungs every 4 (four) hours as needed.  ? carvedilol (COREG) 12.5 MG tablet Take 12.5 mg by mouth 2 (two) times daily with a meal.   ? ELIQUIS 5 MG TABS tablet TAKE (1) TABLET BY MOUTH TWICE DAILY FOR BLOOD THINNER.  ? furosemide (LASIX) 40 MG tablet TAKE ONE TABLET BY MOUTH ONCE DAILY.  ? hydrocortisone (ANUSOL-HC) 2.5 % rectal cream Place 1 application. rectally 3 (three) times daily. USE THREE TIMES A DAY X2 WEEKS THEN AS NEEDED  ? losartan (COZAAR) 25 MG tablet Take 1 tablet (25 mg total) by mouth 2 (two) times daily.  ? Multiple Vitamins-Minerals (VISION FORMULA EYE HEALTH PO) Take 1 tablet by mouth 2 (two) times daily.   ? pantoprazole (PROTONIX) 40 MG tablet TAKE ONE TABLET  BY MOUTH DAILY BEFORE BREAKFAST FOR ACID REFLUX.  ? pramoxine-hydrocortisone (PROCTOCREAM-HC) 1-1 % rectal cream Place 1 application rectally 2 (two) times daily as needed for hemorrhoids or anal itching.  ? Vibegron (GEMTESA) 75 MG TABS Take 1 capsule by mouth daily.  ? ?No current facility-administered medications for this visit. (Other)  ? ? ? ? ?REVIEW OF SYSTEMS: ?ROS   ?Negative for: Constitutional, Gastrointestinal, Neurological, Skin, Genitourinary, Musculoskeletal, HENT, Endocrine, Cardiovascular, Eyes, Respiratory, Psychiatric, Allergic/Imm, Heme/Lymph ?Last edited by Silvestre Moment on 11/17/2021  2:39 PM.  ?  ? ? ? ?ALLERGIES ?Allergies  ?Allergen Reactions  ? Atorvastatin Other (See Comments)  ?  Myalgias  ? Hydrocodone Other (See Comments)  ?  GI distress.  ? ? ?PAST MEDICAL HISTORY ?Past Medical History:  ?Diagnosis Date  ? Anxiety   ? Arteriosclerotic cardiovascular disease (ASCVD)   ?  cath Feb '07- no obstructive dz - 20% proximal LAD only; EF 65%; possible coronary artery spasm  ? Atrial fibrillation ([facility])   ? Paroxysmal on event recorder in 2009; regular supraventricular tachycardia at a rate of 150 also identified on that study representing either atrial flutter or PSVT; onset of persistent AF in 03/2011  ? Carcinoma of breast ([facility]) 2001  ? Left mastectomy with positive nodes  ? Chest pain   ? Long-standing and atypical  ? Chronic anticoagulation  04/12/2011  ? GERD (gastroesophageal reflux disease)   ? hiatal hernia  ? Herpes zoster   ? Hyperlipidemia   ? Lipid profile in 11/2008:282, 176, 64, 201  ? Hypertension   ? diastolic dysfunction; normal CMet and TSH in 11/2009; normal CBC in 04/2010  ? Malignant neoplasm of breast (female), unspecified site 05/27/2013  ? right breast lumpectomy with positive node  ? Peripheral neuropathy   ? Polio 1946  ? at age 59  ? Rectal bleed   ? Renal insufficiency   ? Shortness of breath dyspnea   ? Vitreous hemorrhage of right eye ([facility]) 10/23/2019  ? ?Past Surgical History:   ?Procedure Laterality Date  ? BREAST LUMPECTOMY WITH NEEDLE LOCALIZATION AND AXILLARY SENTINEL LYMPH NODE BX Right 05/27/2013  ? Procedure: BREAST LUMPECTOMY WITH NEEDLE LOCALIZATION AND AXILLARY SENTINEL LYMPH NODE BX;  Surgeon: Pedro Earls, MD;  Location: [facility];  Service: General;  Laterality: Right;  ? BREAST SURGERY    ? CHOLECYSTECTOMY  1999  ? COLONOSCOPY  02/22/2011  ? Procedure: COLONOSCOPY;  Surgeon: Rogene Houston, MD;  [facility];  Service: Endoscopy;  Laterality: N/A;; performed for scant hematochezia  ? DILATION AND CURETTAGE OF UTERUS    ? ESOPHAGOGASTRODUODENOSCOPY  02/22/2011  ? Procedure: ESOPHAGOGASTRODUODENOSCOPY (EGD);  Surgeon: Rogene Houston, MD;  [facility];  Service: Endoscopy;  Laterality: N/A;  ? ESOPHAGOGASTRODUODENOSCOPY N/A 06/02/2014  ? Procedure: ESOPHAGOGASTRODUODENOSCOPY (EGD);  Surgeon: Rogene Houston, MD;  [facility];  Service: Endoscopy;  Laterality: N/A;  730  ? EYE SURGERY    ? both cataracts  ? MALONEY DILATION N/A 06/02/2014  ? Procedure: MALONEY DILATION;  Surgeon: Rogene Houston, MD;  [facility];  Service: Endoscopy;  Laterality: N/A;  ? MASTECTOMY  2001  ? Left;modified radical with lymph node dissection  ? PORT-A-CATH REMOVAL    ? pac in and out 2002  ? ? ?FAMILY HISTORY ?Family History  ?Problem Relation Age of Onset  ? Cancer Sister   ?     lung  ? Cancer Brother   ?     lipoma  ? Cancer Brother   ?     kidney  ? ? ?SOCIAL HISTORY ?Social History  ? ?Tobacco Use  ? Smoking status: Never  ?  Passive exposure: Never  ? Smokeless tobacco: Never  ?Vaping Use  ? Vaping Use: Never used  ?Substance Use Topics  ? Alcohol use: No  ?  Alcohol/week: 0.0 standard drinks  ? Drug use: No  ? ?  ? ?  ? ?OPHTHALMIC EXAM: ? ?Base Eye Exam   ? ? Visual Acuity (ETDRS)   ? ?   Right Left  ? Dist [place] 20/40 -2 20/150  ? Dist ph [place]  20/80  ? ?  ?  ? ? Tonometry (Tonopen, 2:45 PM)   ? ?   Right Left  ? Pressure 11 14   ? ?  ?  ? ? Pupils   ? ?   Pupils APD  ? Right PERRL None  ? Left PERRL None  ? ?  ?  ? ? Visual Fields   ? ?   Left Right  ?  Full Full  ? ?  ?  ? ? Extraocular Movement   ? ?   Right Left  ?  Full Full  ? ?  ?  ? ? Neuro/Psych   ? ? Oriented x3: Yes  ? Mood/Affect:  Normal  ? ?  ?  ? ? Dilation   ? ? Both eyes: 1.0% Mydriacyl, 2.5% Phenylephrine @ 2:45 PM  ? ?  ?  ? ?  ? ?Slit Lamp and Fundus Exam   ? ? External Exam   ? ?   Right Left  ? External Normal Normal  ? ?  ?  ? ? Slit Lamp Exam   ? ?   Right Left  ? Lids/Lashes Normal Normal  ? Conjunctiva/Sclera White and quiet White and quiet  ? Cornea Clear Clear  ? Anterior Chamber Deep and quiet Deep and quiet  ? Iris Round and reactive Round and reactive  ? Lens Posterior chamber intraocular lens, Open posterior capsule Posterior chamber intraocular lens  ? Anterior Vitreous Normal Normal  ? ?  ?  ? ? Fundus Exam   ? ?   Right Left  ? Posterior Vitreous clear , vitrectomized Posterior vitreous detachment  ? Disc Normal Normal  ? C/D Ratio 0.35 0.3  ? Macula Geographic atrophy, Intermediate age related macular degeneration, Subretinal hemorrhage thin slightly smaller, paramacular Disciform scar, Drusen, no exudates, no hemorrhage, no macular thickening, Mottling, Retinal pigment epithelial mottling, Atrophy, Retinal pigment epithelial atrophy, Age related macular degeneration, Early age related macular degeneration, Geographic atrophy near the FAZ, Pigmented atrophy, Hard drusen,  ? Vessels Normal Normal  ? Periphery Normal, no holes Normal, no holes  ? ?  ?  ? ?  ? ? ?IMAGING AND PROCEDURES  ?Imaging and Procedures for 11/17/21 ? ?OCT, Retina - OU - Both Eyes   ? ?   ?Right Eye ?Quality was borderline. Scan locations included subfoveal. Central Foveal Thickness: 291. Progression has been stable. Findings include abnormal foveal contour, pigment epithelial detachment.  ? ?Left Eye ?Quality was good. Scan locations included subfoveal. Central Foveal Thickness: 365.  Progression has been stable. Findings include abnormal foveal contour, cystoid macular edema, intraretinal fluid.  ? ?Notes ?Much less overall thickening and subretinal fluid and edema and intraretin

## 2021-11-25 ENCOUNTER — Ambulatory Visit (INDEPENDENT_AMBULATORY_CARE_PROVIDER_SITE_OTHER): Payer: PPO | Admitting: Urology

## 2021-11-25 VITALS — BP 122/80 | HR 64 | Ht 60.0 in | Wt 164.4 lb

## 2021-11-25 DIAGNOSIS — R300 Dysuria: Secondary | ICD-10-CM | POA: Diagnosis not present

## 2021-11-25 DIAGNOSIS — N3281 Overactive bladder: Secondary | ICD-10-CM

## 2021-11-25 LAB — MICROSCOPIC EXAMINATION
RBC, Urine: NONE SEEN /hpf (ref 0–2)
Renal Epithel, UA: NONE SEEN /hpf

## 2021-11-25 LAB — URINALYSIS, ROUTINE W REFLEX MICROSCOPIC
Bilirubin, UA: NEGATIVE
Glucose, UA: NEGATIVE
Ketones, UA: NEGATIVE
Nitrite, UA: NEGATIVE
RBC, UA: NEGATIVE
Specific Gravity, UA: 1.025 (ref 1.005–1.030)
Urobilinogen, Ur: 2 mg/dL — ABNORMAL HIGH (ref 0.2–1.0)
pH, UA: 6 (ref 5.0–7.5)

## 2021-11-25 MED ORDER — MIRABEGRON ER 25 MG PO TB24: 25.0000 mg | ORAL_TABLET | Freq: Every day | ORAL | 0 refills | Status: AC

## 2021-11-25 NOTE — Patient Instructions (Signed)

## 2021-11-28 NOTE — Progress Notes (Signed)
Sent via mail 

## 2021-11-30 ENCOUNTER — Encounter: Payer: Self-pay | Admitting: Urology

## 2021-11-30 NOTE — Progress Notes (Signed)
11/25/2021 3:01 PM   [name] October 07, 1931 [id number]  Referring provider: Asencion Noble, MD [address] [place],  [place] [address]  Followup dysuria and urinary frequency   HPI: [name] is a 88yo here for followup for dysuria and OAB. Her dysuira has resolved since last visit. She continue to have urinary frequency, urgency and occasional urge incontinence. Nocturia 2-3x. She uses 4-5 pads per day which are wet.    PMH: Past Medical History:  Diagnosis Date   Anxiety    Arteriosclerotic cardiovascular disease (ASCVD)     cath Feb '07- no obstructive dz - 20% proximal LAD only; EF 65%; possible coronary artery spasm   Atrial fibrillation (HCC)    Paroxysmal on event recorder in 2009; regular supraventricular tachycardia at a rate of 150 also identified on that study representing either atrial flutter or PSVT; onset of persistent AF in 03/2011   Carcinoma of breast ([place]) 2001   Left mastectomy with positive nodes   Chest pain    Long-standing and atypical   Chronic anticoagulation 04/12/2011   GERD (gastroesophageal reflux disease)    hiatal hernia   Herpes zoster    Hyperlipidemia    Lipid profile in 11/2008:282, 176, 64, 201   Hypertension    diastolic dysfunction; normal CMet and TSH in 11/2009; normal CBC in 04/2010   Malignant neoplasm of breast (female), unspecified site 05/27/2013   right breast lumpectomy with positive node   Peripheral neuropathy    Polio 1946   at age 13   Rectal bleed    Renal insufficiency    Shortness of breath dyspnea    Vitreous hemorrhage of right eye ([place]) 10/23/2019    Surgical History: Past Surgical History:  Procedure Laterality Date   BREAST LUMPECTOMY WITH NEEDLE LOCALIZATION AND AXILLARY SENTINEL LYMPH NODE BX Right 05/27/2013   Procedure: BREAST LUMPECTOMY WITH NEEDLE LOCALIZATION AND AXILLARY SENTINEL LYMPH NODE BX;  Surgeon: Pedro Earls, MD;  Location: [place];  Service: General;  Laterality:  Right;   [place]   COLONOSCOPY  02/22/2011   Procedure: COLONOSCOPY;  Surgeon: Rogene Houston, MD;  [facility];  Service: Endoscopy;  Laterality: N/A;; performed for scant hematochezia   DILATION AND CURETTAGE OF UTERUS     ESOPHAGOGASTRODUODENOSCOPY  02/22/2011   Procedure: ESOPHAGOGASTRODUODENOSCOPY (EGD);  Surgeon: Rogene Houston, MD;  [facility];  Service: Endoscopy;  Laterality: N/A;   ESOPHAGOGASTRODUODENOSCOPY N/A 06/02/2014   Procedure: ESOPHAGOGASTRODUODENOSCOPY (EGD);  Surgeon: Rogene Houston, MD;  [facility];  Service: Endoscopy;  Laterality: N/A;  730   EYE SURGERY     both cataracts   MALONEY DILATION N/A 06/02/2014   Procedure: MALONEY DILATION;  Surgeon: Rogene Houston, MD;  [facility];  Service: Endoscopy;  Laterality: N/A;   MASTECTOMY  2001   Left;modified radical with lymph node dissection   PORT-A-CATH REMOVAL     pac in and out 2002    Home Medications:  Allergies as of 11/25/2021       Reactions   Atorvastatin Other (See Comments)   Myalgias   Hydrocodone Other (See Comments)   GI distress.        Medication List        Accurate as of Nov 25, 2021 11:59 PM. If you have any questions, ask your nurse or doctor.          STOP taking  these medications    Gemtesa 75 MG Tabs Generic drug: Vibegron       TAKE these medications    acetaminophen 500 MG tablet Commonly known as: TYLENOL Take 500 mg by mouth every 6 (six) hours as needed. Patient reports that she may take 1 or 2 times a day for hip pain.   albuterol 108 (90 Base) MCG/ACT inhaler Commonly known as: VENTOLIN HFA Inhale 2 puffs into the lungs every 4 (four) hours as needed.   carvedilol 12.5 MG tablet Commonly known as: COREG Take 12.5 mg by mouth 2 (two) times daily with a meal.   dorzolamide-timolol 22.3-6.8 MG/ML ophthalmic solution Commonly known as: COSOPT Place 1 drop into the right eye  2 (two) times daily.   Eliquis 5 MG Tabs tablet Generic drug: apixaban TAKE (1) TABLET BY MOUTH TWICE DAILY FOR BLOOD THINNER.   furosemide 40 MG tablet Commonly known as: LASIX TAKE ONE TABLET BY MOUTH ONCE DAILY.   hydrocortisone 2.5 % rectal cream Commonly known as: ANUSOL-HC Place 1 application. rectally 3 (three) times daily. USE THREE TIMES A DAY X2 WEEKS THEN AS NEEDED   Latanoprostene Bunod 0.024 % Soln Place 1 drop into the right eye at bedtime.   losartan 25 MG tablet Commonly known as: COZAAR Take 1 tablet (25 mg total) by mouth 2 (two) times daily.   mirabegron ER 25 MG Tb24 tablet Commonly known as: MYRBETRIQ Take 1 tablet (25 mg total) by mouth daily.   pantoprazole 40 MG tablet Commonly known as: PROTONIX TAKE ONE TABLET BY MOUTH DAILY BEFORE BREAKFAST FOR ACID REFLUX.   pramoxine-hydrocortisone 1-1 % rectal cream Commonly known as: PROCTOCREAM-HC Place 1 application rectally 2 (two) times daily as needed for hemorrhoids or anal itching.   VISION FORMULA EYE HEALTH PO Take 1 tablet by mouth 2 (two) times daily.        Allergies:  Allergies  Allergen Reactions   Atorvastatin Other (See Comments)    Myalgias   Hydrocodone Other (See Comments)    GI distress.    Family History: Family History  Problem Relation Age of Onset   Cancer Sister        lung   Cancer Brother        lipoma   Cancer Brother        kidney    Social History:  reports that she has never smoked. She has never been exposed to tobacco smoke. She has never used smokeless tobacco. She reports that she does not drink alcohol and does not use drugs.  ROS: All other review of systems were reviewed and are negative except what is noted above in HPI  Physical Exam: BP 122/80   Pulse 64   Ht 5' (1.524 m)   Wt 164 lb 6.4 oz (74.6 kg)   BMI 32.11 kg/m   Constitutional:  Alert and oriented, No acute distress. HEENT: [place] AT, moist mucus membranes.  Trachea midline, no  masses. Cardiovascular: No clubbing, cyanosis, or edema. Respiratory: Normal respiratory effort, no increased work of breathing. GI: Abdomen is soft, nontender, nondistended, no abdominal masses GU: No CVA tenderness.  Lymph: No cervical or inguinal lymphadenopathy. Skin: No rashes, bruises or suspicious lesions. Neurologic: Grossly intact, no focal deficits, moving all 4 extremities. Psychiatric: Normal mood and affect.  Laboratory Data: Lab Results  Component Value Date   WBC 6.1 06/25/2018   HGB 13.4 06/25/2018   HCT 43.3 06/25/2018   MCV 95.8 06/25/2018   PLT 218 06/25/2018  Lab Results  Component Value Date   CREATININE 0.90 06/25/2018    No results found for: PSA  No results found for: TESTOSTERONE  No results found for: HGBA1C  Urinalysis    Component Value Date/Time   COLORURINE YELLOW 07/05/2017 2125   APPEARANCEUR Clear 11/25/2021 1117   LABSPEC 1.023 07/05/2017 2125   PHURINE 5.0 07/05/2017 2125   GLUCOSEU Negative 11/25/2021 1117   GLUCOSEU NEGATIVE 11/12/2008 1508   HGBUR NEGATIVE 07/05/2017 2125   BILIRUBINUR Negative 11/25/2021 1117   KETONESUR 20 (A) 07/05/2017 2125   PROTEINUR 2+ (A) 11/25/2021 1117   PROTEINUR NEGATIVE 07/05/2017 2125   UROBILINOGEN 0.2 11/12/2008 1508   NITRITE Negative 11/25/2021 1117   NITRITE NEGATIVE 07/05/2017 2125   LEUKOCYTESUR Trace (A) 11/25/2021 1117    Lab Results  Component Value Date   LABMICR See below: 11/25/2021   WBCUA 0-5 11/25/2021   LABEPIT 0-10 11/25/2021   MUCUS Present 11/25/2021   BACTERIA Few 11/25/2021    Pertinent Imaging:  No results found for this or any previous visit.  No results found for this or any previous visit.  No results found for this or any previous visit.  No results found for this or any previous visit.  No results found for this or any previous visit.  No results found for this or any previous visit.  No results found for this or any previous visit.  No results  found for this or any previous visit.   Assessment & Plan:    1. Burning with urination -resolved - Urinalysis, Routine w reflex microscopic  2. OAB (overactive bladder) -we will trial mirabegron '25mg'$  daily   No follow-ups on file.  Nicolette Bang, MD  [facility] [place]

## 2021-12-09 ENCOUNTER — Ambulatory Visit (INDEPENDENT_AMBULATORY_CARE_PROVIDER_SITE_OTHER): Payer: PPO | Admitting: Urology

## 2021-12-09 ENCOUNTER — Encounter: Payer: Self-pay | Admitting: Urology

## 2021-12-09 VITALS — BP 104/65 | HR 73

## 2021-12-09 DIAGNOSIS — B3731 Acute candidiasis of vulva and vagina: Secondary | ICD-10-CM

## 2021-12-09 DIAGNOSIS — N3281 Overactive bladder: Secondary | ICD-10-CM | POA: Diagnosis not present

## 2021-12-09 LAB — MICROSCOPIC EXAMINATION: Renal Epithel, UA: NONE SEEN /hpf

## 2021-12-09 LAB — URINALYSIS, ROUTINE W REFLEX MICROSCOPIC
Bilirubin, UA: NEGATIVE
Glucose, UA: NEGATIVE
Nitrite, UA: NEGATIVE
Specific Gravity, UA: 1.02 (ref 1.005–1.030)
Urobilinogen, Ur: 4 mg/dL — ABNORMAL HIGH (ref 0.2–1.0)
pH, UA: 7 (ref 5.0–7.5)

## 2021-12-09 MED ORDER — MONISTAT 1 COMBO PACK 1200 & 2 MG & % VA KIT: 1.0000 | PACK | Freq: Once | VAGINAL | 0 refills | Status: AC

## 2021-12-09 NOTE — Progress Notes (Signed)
12/09/2021 12:49 PM   [name] 04-04-1932 [id number]  Referring provider: Asencion Noble, MD [address] [place],  [place] [address]  Followup OAB   HPI: [name] is a 88yo here for followup for OAB and dysuria. She was given mirabegron '25mg'$  which failed to improve here urinary urgency, frequency or dysuria. She stopped eating acidic and spicy foods which has failed to improve her LUTS. She has intermittent dysuria with straining to urinate. Her urinary frequency is every hour. Nocturia 2-4x. No other complaints today   PMH: Past Medical History:  Diagnosis Date   Anxiety    Arteriosclerotic cardiovascular disease (ASCVD)     cath Feb '07- no obstructive dz - 20% proximal LAD only; EF 65%; possible coronary artery spasm   Atrial fibrillation (HCC)    Paroxysmal on event recorder in 2009; regular supraventricular tachycardia at a rate of 150 also identified on that study representing either atrial flutter or PSVT; onset of persistent AF in 03/2011   Carcinoma of breast ([place]) 2001   Left mastectomy with positive nodes   Chest pain    Long-standing and atypical   Chronic anticoagulation 04/12/2011   GERD (gastroesophageal reflux disease)    hiatal hernia   Herpes zoster    Hyperlipidemia    Lipid profile in 11/2008:282, 176, 64, 201   Hypertension    diastolic dysfunction; normal CMet and TSH in 11/2009; normal CBC in 04/2010   Malignant neoplasm of breast (female), unspecified site 05/27/2013   right breast lumpectomy with positive node   Peripheral neuropathy    Polio 1946   at age 88   Rectal bleed    Renal insufficiency    Shortness of breath dyspnea    Vitreous hemorrhage of right eye ([place]) 10/23/2019    Surgical History: Past Surgical History:  Procedure Laterality Date   BREAST LUMPECTOMY WITH NEEDLE LOCALIZATION AND AXILLARY SENTINEL LYMPH NODE BX Right 05/27/2013   Procedure: BREAST LUMPECTOMY WITH NEEDLE LOCALIZATION AND AXILLARY SENTINEL LYMPH NODE  BX;  Surgeon: Pedro Earls, MD;  Location: [place];  Service: General;  Laterality: Right;   [place]   COLONOSCOPY  02/22/2011   Procedure: COLONOSCOPY;  Surgeon: Rogene Houston, MD;  [facility];  Service: Endoscopy;  Laterality: N/A;; performed for scant hematochezia   DILATION AND CURETTAGE OF UTERUS     ESOPHAGOGASTRODUODENOSCOPY  02/22/2011   Procedure: ESOPHAGOGASTRODUODENOSCOPY (EGD);  Surgeon: Rogene Houston, MD;  [facility];  Service: Endoscopy;  Laterality: N/A;   ESOPHAGOGASTRODUODENOSCOPY N/A 06/02/2014   Procedure: ESOPHAGOGASTRODUODENOSCOPY (EGD);  Surgeon: Rogene Houston, MD;  [facility];  Service: Endoscopy;  Laterality: N/A;  730   EYE SURGERY     both cataracts   MALONEY DILATION N/A 06/02/2014   Procedure: MALONEY DILATION;  Surgeon: Rogene Houston, MD;  [facility];  Service: Endoscopy;  Laterality: N/A;   MASTECTOMY  2001   Left;modified radical with lymph node dissection   PORT-A-CATH REMOVAL     pac in and out 2002    Home Medications:  Allergies as of 12/09/2021       Reactions   Atorvastatin Other (See Comments)   Myalgias   Hydrocodone Other (See Comments)   GI distress.        Medication List        Accurate as of December 09, 2021 12:49 PM. If you have any questions,  ask your nurse or doctor.          acetaminophen 500 MG tablet Commonly known as: TYLENOL Take 500 mg by mouth every 6 (six) hours as needed. Patient reports that she may take 1 or 2 times a day for hip pain.   albuterol 108 (90 Base) MCG/ACT inhaler Commonly known as: VENTOLIN HFA Inhale 2 puffs into the lungs every 4 (four) hours as needed.   carvedilol 12.5 MG tablet Commonly known as: COREG Take 12.5 mg by mouth 2 (two) times daily with a meal.   dorzolamide-timolol 22.3-6.8 MG/ML ophthalmic solution Commonly known as: COSOPT Place 1 drop into the right eye 2 (two)  times daily.   Eliquis 5 MG Tabs tablet Generic drug: apixaban TAKE (1) TABLET BY MOUTH TWICE DAILY FOR BLOOD THINNER.   furosemide 40 MG tablet Commonly known as: LASIX TAKE ONE TABLET BY MOUTH ONCE DAILY.   hydrocortisone 2.5 % rectal cream Commonly known as: ANUSOL-HC Place 1 application. rectally 3 (three) times daily. USE THREE TIMES A DAY X2 WEEKS THEN AS NEEDED   Latanoprostene Bunod 0.024 % Soln Place 1 drop into the right eye at bedtime.   losartan 25 MG tablet Commonly known as: COZAAR Take 1 tablet (25 mg total) by mouth 2 (two) times daily.   mirabegron ER 25 MG Tb24 tablet Commonly known as: MYRBETRIQ Take 1 tablet (25 mg total) by mouth daily.   pantoprazole 40 MG tablet Commonly known as: PROTONIX TAKE ONE TABLET BY MOUTH DAILY BEFORE BREAKFAST FOR ACID REFLUX.   pramoxine-hydrocortisone 1-1 % rectal cream Commonly known as: PROCTOCREAM-HC Place 1 application rectally 2 (two) times daily as needed for hemorrhoids or anal itching.   VISION FORMULA EYE HEALTH PO Take 1 tablet by mouth 2 (two) times daily.        Allergies:  Allergies  Allergen Reactions   Atorvastatin Other (See Comments)    Myalgias   Hydrocodone Other (See Comments)    GI distress.    Family History: Family History  Problem Relation Age of Onset   Cancer Sister        lung   Cancer Brother        lipoma   Cancer Brother        kidney    Social History:  reports that she has never smoked. She has never been exposed to tobacco smoke. She has never used smokeless tobacco. She reports that she does not drink alcohol and does not use drugs.  ROS: All other review of systems were reviewed and are negative except what is noted above in HPI  Physical Exam: BP 104/65   Pulse 73   Constitutional:  Alert and oriented, No acute distress. HEENT: [place] AT, moist mucus membranes.  Trachea midline, no masses. Cardiovascular: No clubbing, cyanosis, or edema. Respiratory: Normal  respiratory effort, no increased work of breathing. GI: Abdomen is soft, nontender, nondistended, no abdominal masses GU: No CVA tenderness.  Lymph: No cervical or inguinal lymphadenopathy. Skin: No rashes, bruises or suspicious lesions. Neurologic: Grossly intact, no focal deficits, moving all 4 extremities. Psychiatric: Normal mood and affect.  Laboratory Data: Lab Results  Component Value Date   WBC 6.1 06/25/2018   HGB 13.4 06/25/2018   HCT 43.3 06/25/2018   MCV 95.8 06/25/2018   PLT 218 06/25/2018    Lab Results  Component Value Date   CREATININE 0.90 06/25/2018    No results found for: PSA  No results found for: TESTOSTERONE  No results  found for: HGBA1C  Urinalysis    Component Value Date/Time   COLORURINE YELLOW 07/05/2017 2125   APPEARANCEUR Clear 11/25/2021 1117   LABSPEC 1.023 07/05/2017 2125   PHURINE 5.0 07/05/2017 2125   GLUCOSEU Negative 11/25/2021 1117   GLUCOSEU NEGATIVE 11/12/2008 1508   HGBUR NEGATIVE 07/05/2017 2125   BILIRUBINUR Negative 11/25/2021 1117   KETONESUR 20 (A) 07/05/2017 2125   PROTEINUR 2+ (A) 11/25/2021 1117   PROTEINUR NEGATIVE 07/05/2017 2125   UROBILINOGEN 0.2 11/12/2008 1508   NITRITE Negative 11/25/2021 1117   NITRITE NEGATIVE 07/05/2017 2125   LEUKOCYTESUR Trace (A) 11/25/2021 1117    Lab Results  Component Value Date   LABMICR See below: 11/25/2021   WBCUA 0-5 11/25/2021   LABEPIT 0-10 11/25/2021   MUCUS Present 11/25/2021   BACTERIA Few 11/25/2021    Pertinent Imaging:  No results found for this or any previous visit.  No results found for this or any previous visit.  No results found for this or any previous visit.  No results found for this or any previous visit.  No results found for this or any previous visit.  No results found for this or any previous visit.  No results found for this or any previous visit.  No results found for this or any previous visit.   Assessment & Plan:    1. OAB  (overactive bladder) -We will trial gemtesa '75mg'$  daily  2. Vaginal candidiasis -monistat daily for 1 week.    No follow-ups on file.  Nicolette Bang, MD  [facility] [place]

## 2021-12-19 DIAGNOSIS — I10 Essential (primary) hypertension: Secondary | ICD-10-CM | POA: Diagnosis not present

## 2021-12-19 DIAGNOSIS — I5032 Chronic diastolic (congestive) heart failure: Secondary | ICD-10-CM | POA: Diagnosis not present

## 2021-12-19 DIAGNOSIS — Z79899 Other long term (current) drug therapy: Secondary | ICD-10-CM | POA: Diagnosis not present

## 2021-12-19 DIAGNOSIS — R7303 Prediabetes: Secondary | ICD-10-CM | POA: Diagnosis not present

## 2021-12-23 ENCOUNTER — Ambulatory Visit: Payer: PPO | Admitting: Physician Assistant

## 2021-12-26 ENCOUNTER — Ambulatory Visit (INDEPENDENT_AMBULATORY_CARE_PROVIDER_SITE_OTHER): Payer: PPO | Admitting: Gastroenterology

## 2021-12-26 ENCOUNTER — Encounter (INDEPENDENT_AMBULATORY_CARE_PROVIDER_SITE_OTHER): Payer: Self-pay | Admitting: Gastroenterology

## 2021-12-26 VITALS — BP 116/78 | HR 67 | Temp 98.0°F | Ht 60.0 in | Wt 166.3 lb

## 2021-12-26 DIAGNOSIS — I5032 Chronic diastolic (congestive) heart failure: Secondary | ICD-10-CM | POA: Diagnosis not present

## 2021-12-26 DIAGNOSIS — K625 Hemorrhage of anus and rectum: Secondary | ICD-10-CM

## 2021-12-26 DIAGNOSIS — K649 Unspecified hemorrhoids: Secondary | ICD-10-CM

## 2021-12-26 DIAGNOSIS — I10 Essential (primary) hypertension: Secondary | ICD-10-CM | POA: Diagnosis not present

## 2021-12-26 NOTE — H&P (View-Only) (Signed)
[name], M.D. Gastroenterology & Hepatology [name] [address] [place],  [address]  Primary Care Physician: Asencion Noble, MD [address] [place] [address]  I will communicate my assessment and recommendations to the referring MD via EMR.  Problems: Recurrent rectal bleeding External hemorrhoids  History of Present Illness: [name] is a 88 y.o. female with past medical history of external hemorrhoids, A-fib on anticoagulation, hypertension, peripheral neuropathy, hyperlipidemia, breast cancer, GERD, who presents for follow up of rectal bleeding.  The patient was last seen on 09/26/2021. At that time, the patient was advised to continue pantoprazole 40 mg every day and to use Preparation H wipes for hemorrhoids relief given abnormalities in rectal exam.  She was also given advice to use Anusol.  Patient reports possibly for the last 3 months she has presented intermittent rectal bleeding, which she describes as a large amount that is usually self limited. Due to this, she asked her PCP about the symptoms and was advised to stop her Eliquis for 2 days - bleeding subsided and she restarted the Eliquis yesterday. She has not seen any more bleeding but is still presenting itching in the anal area and some rectal pressure. States she used the Preparation H and Sitz baths without too much improvement. Usually has 2-3 soft Bms every day.  The patient denies having any nausea, vomiting, fever, chills, hematochezia, melena, hematemesis, abdominal distention, abdominal pain, diarrhea, jaundice, pruritus or weight loss.  I received the results of the most recent blood work-up performed on 09/13/2021 which showed a white blood cell count of 4.9, hemoglobin 13.2 and platelets of 190.  CMP showed AST of 18, ALT of 70, alkaline phosphatase 162, total bili 1.1, creatinine 0.93 and normal electrolytes.  Last  Colonoscopy:2012Prep excellent. Normal terminal ileum Few small diverticula at sigmoid colon. Moderate size external hemorrhoids Last Endoscopy:2015No evidence of erosive esophagitis or distal esophageal stricture as suspected based on esophagogram. Therefore esophagus was not dilated. Large sliding hiatal hernia with organoaxial rotation and difficulty encountered in passing scope from proximal into distal half of the stomach. No evidence of pyloric stenosis or peptic ulcer disease.  Past Medical History: Past Medical History:  Diagnosis Date   Anxiety    Arteriosclerotic cardiovascular disease (ASCVD)     cath Feb '07- no obstructive dz - 20% proximal LAD only; EF 65%; possible coronary artery spasm   Atrial fibrillation (HCC)    Paroxysmal on event recorder in 2009; regular supraventricular tachycardia at a rate of 150 also identified on that study representing either atrial flutter or PSVT; onset of persistent AF in 03/2011   Carcinoma of breast ([place]) 2001   Left mastectomy with positive nodes   Chest pain    Long-standing and atypical   Chronic anticoagulation 04/12/2011   GERD (gastroesophageal reflux disease)    hiatal hernia   Herpes zoster    Hyperlipidemia    Lipid profile in 11/2008:282, 176, 43, 201   Hypertension    diastolic dysfunction; normal CMet and TSH in 11/2009; normal CBC in 04/2010   Malignant neoplasm of breast (female), unspecified site 05/27/2013   right breast lumpectomy with positive node   Peripheral neuropathy    Polio 1946   at age 66   Rectal bleed    Renal insufficiency    Shortness of breath dyspnea    Vitreous hemorrhage of right eye ([place]) 10/23/2019    Past Surgical History: Past Surgical History:  Procedure Laterality  Date   BREAST LUMPECTOMY WITH NEEDLE LOCALIZATION AND AXILLARY SENTINEL LYMPH NODE BX Right 05/27/2013   Procedure: BREAST LUMPECTOMY WITH NEEDLE LOCALIZATION AND AXILLARY SENTINEL LYMPH NODE BX;  Surgeon: Pedro Earls, MD;   Location: [place];  Service: General;  Laterality: Right;   [place]   COLONOSCOPY  02/22/2011   Procedure: COLONOSCOPY;  Surgeon: Rogene Houston, MD;  [facility];  Service: Endoscopy;  Laterality: N/A;; performed for scant hematochezia   DILATION AND CURETTAGE OF UTERUS     ESOPHAGOGASTRODUODENOSCOPY  02/22/2011   Procedure: ESOPHAGOGASTRODUODENOSCOPY (EGD);  Surgeon: Rogene Houston, MD;  [facility];  Service: Endoscopy;  Laterality: N/A;   ESOPHAGOGASTRODUODENOSCOPY N/A 06/02/2014   Procedure: ESOPHAGOGASTRODUODENOSCOPY (EGD);  Surgeon: Rogene Houston, MD;  [facility];  Service: Endoscopy;  Laterality: N/A;  730   EYE SURGERY     both cataracts   MALONEY DILATION N/A 06/02/2014   Procedure: MALONEY DILATION;  Surgeon: Rogene Houston, MD;  [facility];  Service: Endoscopy;  Laterality: N/A;   MASTECTOMY  2001   Left;modified radical with lymph node dissection   PORT-A-CATH REMOVAL     pac in and out 2002    Family History: Family History  Problem Relation Age of Onset   Cancer Sister        lung   Cancer Brother        lipoma   Cancer Brother        kidney    Social History: Social History   Tobacco Use  Smoking Status Never   Passive exposure: Never  Smokeless Tobacco Never   Social History   Substance and Sexual Activity  Alcohol Use No   Alcohol/week: 0.0 standard drinks of alcohol   Social History   Substance and Sexual Activity  Drug Use No    Allergies: Allergies  Allergen Reactions   Atorvastatin Other (See Comments)    Myalgias   Hydrocodone Other (See Comments)    GI distress.    Medications: Current Outpatient Medications  Medication Sig Dispense Refill   acetaminophen (TYLENOL) 500 MG tablet Take 500 mg by mouth every 6 (six) hours as needed. Patient reports that she may take 1 or 2 times a day for hip pain.     albuterol (VENTOLIN HFA) 108  (90 Base) MCG/ACT inhaler Inhale 2 puffs into the lungs every 4 (four) hours as needed.     carvedilol (COREG) 12.5 MG tablet Take 12.5 mg by mouth 2 (two) times daily with a meal.      dorzolamide-timolol (COSOPT) 22.3-6.8 MG/ML ophthalmic solution Place 1 drop into the right eye 2 (two) times daily.      ELIQUIS 5 MG TABS tablet TAKE (1) TABLET BY MOUTH TWICE DAILY FOR BLOOD THINNER. 60 tablet 5   furosemide (LASIX) 40 MG tablet TAKE ONE TABLET BY MOUTH ONCE DAILY. 90 tablet 3   hydrocortisone (ANUSOL-HC) 2.5 % rectal cream Place 1 application. rectally 3 (three) times daily. USE THREE TIMES A DAY X2 WEEKS THEN AS NEEDED 70 g 1   Latanoprostene Bunod 0.024 % SOLN Place 1 drop into the right eye at bedtime.      losartan (COZAAR) 25 MG tablet Take 1 tablet (25 mg total) by mouth 2 (two) times daily. 90 tablet 3   Multiple Vitamins-Minerals (VISION FORMULA EYE HEALTH PO) Take 1 tablet by mouth 2 (two) times daily.  pantoprazole (PROTONIX) 40 MG tablet TAKE ONE TABLET BY MOUTH DAILY BEFORE BREAKFAST FOR ACID REFLUX. 90 tablet 3   RHOPRESSA 0.02 % SOLN SMARTSIG:1 Drop(s) Right Eye Every Evening     mirabegron ER (MYRBETRIQ) 25 MG TB24 tablet Take 1 tablet (25 mg total) by mouth daily. (Patient not taking: Reported on 12/26/2021) 30 tablet 0   pramoxine-hydrocortisone (PROCTOCREAM-HC) 1-1 % rectal cream Place 1 application rectally 2 (two) times daily as needed for hemorrhoids or anal itching. (Patient not taking: Reported on 12/26/2021) 30 g 1   No current facility-administered medications for this visit.    Review of Systems: GENERAL: negative for malaise, night sweats HEENT: No changes in hearing or vision, no nose bleeds or other nasal problems. NECK: Negative for lumps, goiter, pain and significant neck swelling RESPIRATORY: Negative for cough, wheezing CARDIOVASCULAR: Negative for chest pain, leg swelling, palpitations, orthopnea GI: SEE HPI MUSCULOSKELETAL: Negative for joint pain or  swelling, back pain, and muscle pain. SKIN: Negative for lesions, rash PSYCH: Negative for sleep disturbance, mood disorder and recent psychosocial stressors. HEMATOLOGY Negative for prolonged bleeding, bruising easily, and swollen nodes. ENDOCRINE: Negative for cold or heat intolerance, polyuria, polydipsia and goiter. NEURO: negative for tremor, gait imbalance, syncope and seizures. The remainder of the review of systems is noncontributory.   Physical Exam: BP 116/78 (BP Location: Right Arm, Patient Position: Sitting, Cuff Size: Large)   Pulse 67   Temp 98 F (36.7 C) (Oral)   Ht 5' (1.524 m)   Wt 166 lb 4.8 oz (75.4 kg)   BMI 32.48 kg/m  GENERAL: The patient is AO x3, in no acute distress. HEENT: Head is normocephalic and atraumatic. EOMI are intact. Mouth is well hydrated and without lesions. NECK: Supple. No masses LUNGS: Clear to auscultation. No presence of rhonchi/wheezing/rales. Adequate chest expansion HEART: RRR, normal s1 and s2. ABDOMEN: Soft, nontender, no guarding, no peritoneal signs, and nondistended. BS +. No masses. RECTAL EXAM: deferred EXTREMITIES: Without any cyanosis, clubbing, rash, lesions or edema. NEUROLOGIC: AOx3, no focal motor deficit. SKIN: no jaundice, no rashes  Imaging/Labs: as above  I personally reviewed and interpreted the available labs, imaging and endoscopic files.  Impression and Plan: [name] is a 88 y.o. female with past medical history of external hemorrhoids, A-fib on anticoagulation, hypertension, peripheral neuropathy, hyperlipidemia, breast cancer, GERD, who presents for follow up of rectal bleeding.  The patient has presented recurrent episodes of rectal bleeding, which given her most recent rectal exam and her possibly related to external hemorrhoidal bleeding.  We discussed about the possibility of performing a flexible sigmoidoscopy to evaluate for other reasons leading to the bleeding and sensation of rectal fullness she  is presenting.  We also discussed about the fact that she should try pharmacological management and topical therapy with sitz bath's as much as possible, as if this fails the next step is to proceed with surgical evaluation.  She we will try to use other topical agents as she would like to avoid surgery as much as possible which I find reasonable.  - Start witch hazel application every 8 hours - Continue Sitz baths - Schedule flexible sigmoidoscopy - will need to obtain clearance from cardiologist to stop Eliquis 2 days before the procedure  All questions were answered.      Harvel Quale, MD Gastroenterology and Hepatology [facility]

## 2021-12-26 NOTE — Patient Instructions (Addendum)
Start witch hazel application every 8 hours Continue Sitz baths Schedule flexible sigmoidoscopy will need to obtain clearance from cardiologist to stop Eliquis 2 days before the procedure

## 2021-12-26 NOTE — Progress Notes (Signed)
[name], M.D. Gastroenterology & Hepatology [facility] [address] [place], [place] [address]  Primary Care Physician: Asencion Noble, MD [address] [place] [address]  I will communicate my assessment and recommendations to the referring MD via EMR.  Problems: Recurrent rectal bleeding External hemorrhoids  History of Present Illness: [name] is a 88 y.o. female with past medical history of external hemorrhoids, A-fib on anticoagulation, hypertension, peripheral neuropathy, hyperlipidemia, breast cancer, GERD, who presents for follow up of rectal bleeding.  The patient was last seen on 09/26/2021. At that time, the patient was advised to continue pantoprazole 40 mg every day and to use Preparation H wipes for hemorrhoids relief given abnormalities in rectal exam.  She was also given advice to use Anusol.  Patient reports possibly for the last 3 months she has presented intermittent rectal bleeding, which she describes as a large amount that is usually self limited. Due to this, she asked her PCP about the symptoms and was advised to stop her Eliquis for 2 days - bleeding subsided and she restarted the Eliquis yesterday. She has not seen any more bleeding but is still presenting itching in the anal area and some rectal pressure. States she used the Preparation H and Sitz baths without too much improvement. Usually has 2-3 soft Bms every day.  The patient denies having any nausea, vomiting, fever, chills, hematochezia, melena, hematemesis, abdominal distention, abdominal pain, diarrhea, jaundice, pruritus or weight loss.  I received the results of the most recent blood work-up performed on 09/13/2021 which showed a white blood cell count of 4.9, hemoglobin 13.2 and platelets of 190.  CMP showed AST of 18, ALT of 70, alkaline phosphatase 162, total bili 1.1, creatinine 0.93 and normal electrolytes.  Last  Colonoscopy:2012Prep excellent. Normal terminal ileum Few small diverticula at sigmoid colon. Moderate size external hemorrhoids Last Endoscopy:2015No evidence of erosive esophagitis or distal esophageal stricture as suspected based on esophagogram. Therefore esophagus was not dilated. Large sliding hiatal hernia with organoaxial rotation and difficulty encountered in passing scope from proximal into distal half of the stomach. No evidence of pyloric stenosis or peptic ulcer disease.  Past Medical History: Past Medical History:  Diagnosis Date   Anxiety    Arteriosclerotic cardiovascular disease (ASCVD)     cath Feb '07- no obstructive dz - 20% proximal LAD only; EF 65%; possible coronary artery spasm   Atrial fibrillation (HCC)    Paroxysmal on event recorder in 2009; regular supraventricular tachycardia at a rate of 150 also identified on that study representing either atrial flutter or PSVT; onset of persistent AF in 03/2011   Carcinoma of breast ([place]) 2001   Left mastectomy with positive nodes   Chest pain    Long-standing and atypical   Chronic anticoagulation 04/12/2011   GERD (gastroesophageal reflux disease)    hiatal hernia   Herpes zoster    Hyperlipidemia    Lipid profile in 11/2008:282, 176, 24, 201   Hypertension    diastolic dysfunction; normal CMet and TSH in 11/2009; normal CBC in 04/2010   Malignant neoplasm of breast (female), unspecified site 05/27/2013   right breast lumpectomy with positive node   Peripheral neuropathy    Polio 1946   at age 32   Rectal bleed    Renal insufficiency    Shortness of breath dyspnea    Vitreous hemorrhage of right eye ([place]) 10/23/2019    Past Surgical History: Past Surgical History:  Procedure Laterality  Date   BREAST LUMPECTOMY WITH NEEDLE LOCALIZATION AND AXILLARY SENTINEL LYMPH NODE BX Right 05/27/2013   Procedure: BREAST LUMPECTOMY WITH NEEDLE LOCALIZATION AND AXILLARY SENTINEL LYMPH NODE BX;  Surgeon: Pedro Earls, MD;   Location: [place];  Service: General;  Laterality: Right;   [place]   COLONOSCOPY  02/22/2011   Procedure: COLONOSCOPY;  Surgeon: Rogene Houston, MD;  [facility];  Service: Endoscopy;  Laterality: N/A;; performed for scant hematochezia   DILATION AND CURETTAGE OF UTERUS     ESOPHAGOGASTRODUODENOSCOPY  02/22/2011   Procedure: ESOPHAGOGASTRODUODENOSCOPY (EGD);  Surgeon: Rogene Houston, MD;  [facility];  Service: Endoscopy;  Laterality: N/A;   ESOPHAGOGASTRODUODENOSCOPY N/A 06/02/2014   Procedure: ESOPHAGOGASTRODUODENOSCOPY (EGD);  Surgeon: Rogene Houston, MD;  [facility];  Service: Endoscopy;  Laterality: N/A;  730   EYE SURGERY     both cataracts   MALONEY DILATION N/A 06/02/2014   Procedure: MALONEY DILATION;  Surgeon: Rogene Houston, MD;  [facility];  Service: Endoscopy;  Laterality: N/A;   MASTECTOMY  2001   Left;modified radical with lymph node dissection   PORT-A-CATH REMOVAL     pac in and out 2002    Family History: Family History  Problem Relation Age of Onset   Cancer Sister        lung   Cancer Brother        lipoma   Cancer Brother        kidney    Social History: Social History   Tobacco Use  Smoking Status Never   Passive exposure: Never  Smokeless Tobacco Never   Social History   Substance and Sexual Activity  Alcohol Use No   Alcohol/week: 0.0 standard drinks of alcohol   Social History   Substance and Sexual Activity  Drug Use No    Allergies: Allergies  Allergen Reactions   Atorvastatin Other (See Comments)    Myalgias   Hydrocodone Other (See Comments)    GI distress.    Medications: Current Outpatient Medications  Medication Sig Dispense Refill   acetaminophen (TYLENOL) 500 MG tablet Take 500 mg by mouth every 6 (six) hours as needed. Patient reports that she may take 1 or 2 times a day for hip pain.     albuterol (VENTOLIN HFA) 108  (90 Base) MCG/ACT inhaler Inhale 2 puffs into the lungs every 4 (four) hours as needed.     carvedilol (COREG) 12.5 MG tablet Take 12.5 mg by mouth 2 (two) times daily with a meal.      dorzolamide-timolol (COSOPT) 22.3-6.8 MG/ML ophthalmic solution Place 1 drop into the right eye 2 (two) times daily.      ELIQUIS 5 MG TABS tablet TAKE (1) TABLET BY MOUTH TWICE DAILY FOR BLOOD THINNER. 60 tablet 5   furosemide (LASIX) 40 MG tablet TAKE ONE TABLET BY MOUTH ONCE DAILY. 90 tablet 3   hydrocortisone (ANUSOL-HC) 2.5 % rectal cream Place 1 application. rectally 3 (three) times daily. USE THREE TIMES A DAY X2 WEEKS THEN AS NEEDED 70 g 1   Latanoprostene Bunod 0.024 % SOLN Place 1 drop into the right eye at bedtime.      losartan (COZAAR) 25 MG tablet Take 1 tablet (25 mg total) by mouth 2 (two) times daily. 90 tablet 3   Multiple Vitamins-Minerals (VISION FORMULA EYE HEALTH PO) Take 1 tablet by mouth 2 (two) times daily.  pantoprazole (PROTONIX) 40 MG tablet TAKE ONE TABLET BY MOUTH DAILY BEFORE BREAKFAST FOR ACID REFLUX. 90 tablet 3   RHOPRESSA 0.02 % SOLN SMARTSIG:1 Drop(s) Right Eye Every Evening     mirabegron ER (MYRBETRIQ) 25 MG TB24 tablet Take 1 tablet (25 mg total) by mouth daily. (Patient not taking: Reported on 12/26/2021) 30 tablet 0   pramoxine-hydrocortisone (PROCTOCREAM-HC) 1-1 % rectal cream Place 1 application rectally 2 (two) times daily as needed for hemorrhoids or anal itching. (Patient not taking: Reported on 12/26/2021) 30 g 1   No current facility-administered medications for this visit.    Review of Systems: GENERAL: negative for malaise, night sweats HEENT: No changes in hearing or vision, no nose bleeds or other nasal problems. NECK: Negative for lumps, goiter, pain and significant neck swelling RESPIRATORY: Negative for cough, wheezing CARDIOVASCULAR: Negative for chest pain, leg swelling, palpitations, orthopnea GI: SEE HPI MUSCULOSKELETAL: Negative for joint pain or  swelling, back pain, and muscle pain. SKIN: Negative for lesions, rash PSYCH: Negative for sleep disturbance, mood disorder and recent psychosocial stressors. HEMATOLOGY Negative for prolonged bleeding, bruising easily, and swollen nodes. ENDOCRINE: Negative for cold or heat intolerance, polyuria, polydipsia and goiter. NEURO: negative for tremor, gait imbalance, syncope and seizures. The remainder of the review of systems is noncontributory.   Physical Exam: BP 116/78 (BP Location: Right Arm, Patient Position: Sitting, Cuff Size: Large)   Pulse 67   Temp 98 F (36.7 C) (Oral)   Ht 5' (1.524 m)   Wt 166 lb 4.8 oz (75.4 kg)   BMI 32.48 kg/m  GENERAL: The patient is AO x3, in no acute distress. HEENT: Head is normocephalic and atraumatic. EOMI are intact. Mouth is well hydrated and without lesions. NECK: Supple. No masses LUNGS: Clear to auscultation. No presence of rhonchi/wheezing/rales. Adequate chest expansion HEART: RRR, normal s1 and s2. ABDOMEN: Soft, nontender, no guarding, no peritoneal signs, and nondistended. BS +. No masses. RECTAL EXAM: deferred EXTREMITIES: Without any cyanosis, clubbing, rash, lesions or edema. NEUROLOGIC: AOx3, no focal motor deficit. SKIN: no jaundice, no rashes  Imaging/Labs: as above  I personally reviewed and interpreted the available labs, imaging and endoscopic files.  Impression and Plan: [name] is a 88 y.o. female with past medical history of external hemorrhoids, A-fib on anticoagulation, hypertension, peripheral neuropathy, hyperlipidemia, breast cancer, GERD, who presents for follow up of rectal bleeding.  The patient has presented recurrent episodes of rectal bleeding, which given her most recent rectal exam and her possibly related to external hemorrhoidal bleeding.  We discussed about the possibility of performing a flexible sigmoidoscopy to evaluate for other reasons leading to the bleeding and sensation of rectal fullness she  is presenting.  We also discussed about the fact that she should try pharmacological management and topical therapy with sitz bath's as much as possible, as if this fails the next step is to proceed with surgical evaluation.  She we will try to use other topical agents as she would like to avoid surgery as much as possible which I find reasonable.  - Start witch hazel application every 8 hours - Continue Sitz baths - Schedule flexible sigmoidoscopy - will need to obtain clearance from cardiologist to stop Eliquis 2 days before the procedure  All questions were answered.      Harvel Quale, MD Gastroenterology and Hepatology [facility]

## 2021-12-28 ENCOUNTER — Other Ambulatory Visit (INDEPENDENT_AMBULATORY_CARE_PROVIDER_SITE_OTHER): Payer: Self-pay

## 2021-12-29 ENCOUNTER — Encounter (INDEPENDENT_AMBULATORY_CARE_PROVIDER_SITE_OTHER): Payer: Self-pay

## 2021-12-29 ENCOUNTER — Telehealth: Payer: Self-pay

## 2021-12-29 ENCOUNTER — Ambulatory Visit: Payer: PPO | Admitting: Physician Assistant

## 2021-12-29 NOTE — Telephone Encounter (Signed)
Patient with diagnosis of afib on Eliquis for anticoagulation.    Procedure: flex sigmoidoscopy Date of procedure: 01/24/22  CHA2DS2-VASc Score = 6  This indicates a 9.7% annual risk of stroke. The patient's score is based upon: CHF History: 1 HTN History: 1 Diabetes History: 0 Stroke History: 0 Vascular Disease History: 1 Age Score: 2 Gender Score: 1  CrCl 57m/min using adjusted body weight due to obesity Platelet count 190K  Per office protocol, patient can hold Eliquis for 2 days prior to procedure.

## 2021-12-29 NOTE — Telephone Encounter (Signed)
   Name: [name]  DOB: September 07, 1931  MRN: [id number]   Primary Cardiologist: None  Chart reviewed as part of pre-operative protocol coverage.  [name] was last seen on 09/06/2021 by Dr. Lovena Le.  She was doing well at that time without any episodes of syncope, chest pain, and only mild peripheral edema.   Only pharmaceutical clearance was requested.  Per the pharmacy team she is able to hold her Eliquis for 2 days prior to her procedure.  CHA2DS2-VASc score of 6 with 9.7% annual risk of stroke.  I will route this recommendation to the requesting party via Epic fax function and remove from pre-op pool. Please call with questions.  Elgie Collard, PA-C 12/29/2021, 4:51 PM

## 2021-12-29 NOTE — Telephone Encounter (Signed)
   Pre-operative Risk Assessment    Patient Name: [name]  DOB: 17-Aug-1931 MRN: [id number]      Request for Surgical Clearance    Procedure:   Flex Sigmoidoscopy  Date of Surgery:  Clearance 01/24/22                                 Surgeon:  DR. DANIEL CASTANEDA Surgeon's Group or Practice Name:  [facility] Phone number:  [phone] Fax number:  [phone]   Type of Clearance Requested:   - Pharmacy:  Hold Apixaban (Eliquis) NEEDS INSTRUCTION   Type of Anesthesia:  MAC   Additional requests/questions:    SignedJacinta Shoe   12/29/2021, 3:42 PM

## 2022-01-02 ENCOUNTER — Encounter (INDEPENDENT_AMBULATORY_CARE_PROVIDER_SITE_OTHER): Payer: PPO | Admitting: Ophthalmology

## 2022-01-03 ENCOUNTER — Encounter (INDEPENDENT_AMBULATORY_CARE_PROVIDER_SITE_OTHER): Payer: Self-pay | Admitting: Ophthalmology

## 2022-01-03 ENCOUNTER — Ambulatory Visit (INDEPENDENT_AMBULATORY_CARE_PROVIDER_SITE_OTHER): Payer: PPO | Admitting: Ophthalmology

## 2022-01-03 DIAGNOSIS — H35721 Serous detachment of retinal pigment epithelium, right eye: Secondary | ICD-10-CM | POA: Diagnosis not present

## 2022-01-03 DIAGNOSIS — H353124 Nonexudative age-related macular degeneration, left eye, advanced atrophic with subfoveal involvement: Secondary | ICD-10-CM | POA: Diagnosis not present

## 2022-01-03 DIAGNOSIS — H353114 Nonexudative age-related macular degeneration, right eye, advanced atrophic with subfoveal involvement: Secondary | ICD-10-CM

## 2022-01-03 DIAGNOSIS — H353134 Nonexudative age-related macular degeneration, bilateral, advanced atrophic with subfoveal involvement: Secondary | ICD-10-CM | POA: Diagnosis not present

## 2022-01-03 DIAGNOSIS — H353211 Exudative age-related macular degeneration, right eye, with active choroidal neovascularization: Secondary | ICD-10-CM | POA: Diagnosis not present

## 2022-01-03 DIAGNOSIS — H353231 Exudative age-related macular degeneration, bilateral, with active choroidal neovascularization: Secondary | ICD-10-CM

## 2022-01-03 DIAGNOSIS — H353221 Exudative age-related macular degeneration, left eye, with active choroidal neovascularization: Secondary | ICD-10-CM | POA: Diagnosis not present

## 2022-01-03 MED ORDER — BEVACIZUMAB 2.5 MG/0.1ML IZ SOSY
2.5000 mg | PREFILLED_SYRINGE | INTRAVITREAL | Status: AC | PRN
  Administered 2022-01-03: 2.5 mg via INTRAVITREAL

## 2022-01-03 MED ORDER — FLUORESCEIN SODIUM 10 % IV SOLN
500.0000 mg | INTRAVENOUS | Status: AC | PRN
  Administered 2022-01-03: 500 mg via INTRAVENOUS

## 2022-01-03 NOTE — Assessment & Plan Note (Signed)
Accounts for acuity 

## 2022-01-03 NOTE — Assessment & Plan Note (Signed)
Perifoveal disease OD

## 2022-01-16 NOTE — Patient Instructions (Signed)
[name]  01/16/2022     '@PREFPERIOPPHARMACY'$ @   Your procedure is scheduled on  01/24/2022.   Report to [facility] at  1300 (1:00)  P.M.   Call this number if you have problems the morning of surgery:  [phone]   Remember:  Follow the diet and prep instructions given to you by the office.     Your last dose of eliquis should be on 01/21/2022.     Take these medicines the morning of surgery with A SIP OF WATER                                coreg, protonix.     Do not wear jewelry, make-up or nail polish.  Do not wear lotions, powders, or perfumes, or deodorant.  Do not shave 48 hours prior to surgery.  Men may shave face and neck.  Do not bring valuables to the hospital.  [facility] is not responsible for any belongings or valuables.  Contacts, dentures or bridgework may not be worn into surgery.  Leave your suitcase in the car.  After surgery it may be brought to your room.  For patients admitted to the hospital, discharge time will be determined by your treatment team.  Patients discharged the day of surgery will not be allowed to drive home and must have someone with them for 24 hours.    Special instructions:   DO NOT smoke tobacco or vape for 24 hours before your procedure.  Please read over the following fact sheets that you were given. Anesthesia Post-op Instructions and Care and Recovery After Surgery      Flexible Sigmoidoscopy, Care After This sheet gives you information about how to care for yourself after your procedure. Your health care provider may also give you more specific instructions. If you have problems or questions, contact your health care provider. What can I expect after the procedure? After the procedure, it is common to have: Cramping or pain in your abdomen. Bloating. A small amount of blood with your bowel movements. This may happen if a sample of tissue was removed for testing (biopsy). Follow these instructions at  home: Eating and drinking  Drink enough fluid to keep your urine pale yellow. Follow instructions from your health care provider about eating or drinking restrictions. Resume your normal diet as instructed by your health care provider. Avoid heavy or fried foods that are hard to digest. Activity  If you were given a medicine to help you relax (sedative) during the procedure, it can affect you for several hours. Do not drive or operate machinery until your health care provider says that it is safe. Rest as told by your health care provider. Return to your normal activities as told by your health care provider. Ask your health care provider what activities are safe for you. General instructions Take over-the-counter and prescription medicines only as told by your health care provider. Try walking around when you have cramps or feel bloated. Keep all follow-up visits as told by your health care provider. This is important. Contact a health care provider if: You have pain or cramping in your abdomen that gets worse or is not helped with medicine. You have a small amount of bleeding from your rectum that continues after 24 hours. You have nausea or vomiting. You feel weak or dizzy. You develop a fever. Get help right away  if: You pass large blood clots or see a large amount of blood in the toilet after having a bowel movement. You have severe pain in your abdomen. You have nausea or vomiting for more than 24 hours after the procedure. Summary After the procedure, you may have cramping or pain in your abdomen or you may have bloating. If you had a sample of tissue removed (biopsy), you may have a small amount of blood with your bowel movements. Resume your normal diet as instructed by your health care provider. Avoid heavy or fried foods that are hard to digest. Try walking around when you have cramps or feel bloated. Get help right away if you pass large blood clots or see a large amount of  blood in the toilet after having a bowel movement. This information is not intended to replace advice given to you by your health care provider. Make sure you discuss any questions you have with your health care provider. Document Revised: 06/23/2019 Document Reviewed: 06/23/2019 Elsevier Patient Education  [place] After This sheet gives you information about how to care for yourself after your procedure. Your health care provider may also give you more specific instructions. If you have problems or questions, contact your health care provider. What can I expect after the procedure? After the procedure, it is common to have: Tiredness. Forgetfulness about what happened after the procedure. Impaired judgment for important decisions. Nausea or vomiting. Some difficulty with balance. Follow these instructions at home: For the time period you were told by your health care provider:     Rest as needed. Do not participate in activities where you could fall or become injured. Do not drive or use machinery. Do not drink alcohol. Do not take sleeping pills or medicines that cause drowsiness. Do not make important decisions or sign legal documents. Do not take care of children on your own. Eating and drinking Follow the diet that is recommended by your health care provider. Drink enough fluid to keep your urine pale yellow. If you vomit: Drink water, juice, or soup when you can drink without vomiting. Make sure you have little or no nausea before eating solid foods. General instructions Have a responsible adult stay with you for the time you are told. It is important to have someone help care for you until you are awake and alert. Take over-the-counter and prescription medicines only as told by your health care provider. If you have sleep apnea, surgery and certain medicines can increase your risk for breathing problems. Follow instructions from  your health care provider about wearing your sleep device: Anytime you are sleeping, including during daytime naps. While taking prescription pain medicines, sleeping medicines, or medicines that make you drowsy. Avoid smoking. Keep all follow-up visits as told by your health care provider. This is important. Contact a health care provider if: You keep feeling nauseous or you keep vomiting. You feel light-headed. You are still sleepy or having trouble with balance after 24 hours. You develop a rash. You have a fever. You have redness or swelling around the IV site. Get help right away if: You have trouble breathing. You have new-onset confusion at home. Summary For several hours after your procedure, you may feel tired. You may also be forgetful and have poor judgment. Have a responsible adult stay with you for the time you are told. It is important to have someone help care for you until you are awake and alert. Rest as  told. Do not drive or operate machinery. Do not drink alcohol or take sleeping pills. Get help right away if you have trouble breathing, or if you suddenly become confused. This information is not intended to replace advice given to you by your health care provider. Make sure you discuss any questions you have with your health care provider. Document Revised: 05/31/2021 Document Reviewed: 05/29/2019 Elsevier Patient Education  [place].

## 2022-01-18 ENCOUNTER — Encounter (HOSPITAL_COMMUNITY)
Admission: RE | Admit: 2022-01-18 | Discharge: 2022-01-18 | Disposition: A | Discharge status: Home / Self Care | Payer: PPO | Source: Ambulatory Visit | Attending: Gastroenterology | Admitting: Gastroenterology

## 2022-01-18 ENCOUNTER — Encounter (INDEPENDENT_AMBULATORY_CARE_PROVIDER_SITE_OTHER): Payer: Self-pay

## 2022-01-18 ENCOUNTER — Encounter (HOSPITAL_COMMUNITY): Payer: Self-pay

## 2022-01-18 VITALS — BP 127/83 | HR 59 | Temp 97.7°F | Resp 18 | Ht 60.0 in | Wt 164.0 lb

## 2022-01-18 DIAGNOSIS — Z79899 Other long term (current) drug therapy: Secondary | ICD-10-CM

## 2022-01-18 DIAGNOSIS — Z01818 Encounter for other preprocedural examination: Secondary | ICD-10-CM | POA: Diagnosis not present

## 2022-01-18 DIAGNOSIS — I10 Essential (primary) hypertension: Secondary | ICD-10-CM

## 2022-01-18 HISTORY — DX: Cardiac arrhythmia, unspecified: I49.9

## 2022-01-18 HISTORY — DX: Heart failure, unspecified: I50.9

## 2022-01-18 HISTORY — DX: Other specified postprocedural states: Z98.890

## 2022-01-18 HISTORY — DX: Other specified postprocedural states: R11.2

## 2022-01-18 LAB — BASIC METABOLIC PANEL
Anion gap: 5 (ref 5–15)
BUN: 19 mg/dL (ref 8–23)
CO2: 26 mmol/L (ref 22–32)
Calcium: 9.4 mg/dL (ref 8.9–10.3)
Chloride: 108 mmol/L (ref 98–111)
Creatinine, Ser: 0.99 mg/dL (ref 0.44–1.00)
GFR, Estimated: 55 mL/min — ABNORMAL LOW (ref >60)
Glucose, Bld: 106 mg/dL — ABNORMAL HIGH (ref 70–99)
Potassium: 4.2 mmol/L (ref 3.5–5.1)
Sodium: 139 mmol/L (ref 135–145)

## 2022-01-24 ENCOUNTER — Other Ambulatory Visit: Payer: Self-pay

## 2022-01-24 ENCOUNTER — Encounter (HOSPITAL_COMMUNITY): Payer: Self-pay | Admitting: Gastroenterology

## 2022-01-24 ENCOUNTER — Encounter (HOSPITAL_COMMUNITY)
Admission: RE | Disposition: A | Discharge status: Home / Self Care | Payer: Self-pay | Source: Home / Self Care | Attending: Gastroenterology

## 2022-01-24 ENCOUNTER — Ambulatory Visit (HOSPITAL_BASED_OUTPATIENT_CLINIC_OR_DEPARTMENT_OTHER): Payer: PPO | Admitting: Anesthesiology

## 2022-01-24 ENCOUNTER — Ambulatory Visit (HOSPITAL_COMMUNITY): Payer: PPO | Admitting: Anesthesiology

## 2022-01-24 ENCOUNTER — Ambulatory Visit (HOSPITAL_COMMUNITY)
Admission: RE | Admit: 2022-01-24 | Discharge: 2022-01-24 | Disposition: A | Discharge status: Home / Self Care | Payer: PPO | Attending: Gastroenterology | Admitting: Gastroenterology

## 2022-01-24 DIAGNOSIS — Z79899 Other long term (current) drug therapy: Secondary | ICD-10-CM | POA: Diagnosis not present

## 2022-01-24 DIAGNOSIS — K648 Other hemorrhoids: Secondary | ICD-10-CM

## 2022-01-24 DIAGNOSIS — K219 Gastro-esophageal reflux disease without esophagitis: Secondary | ICD-10-CM | POA: Diagnosis not present

## 2022-01-24 DIAGNOSIS — I4891 Unspecified atrial fibrillation: Secondary | ICD-10-CM | POA: Diagnosis not present

## 2022-01-24 DIAGNOSIS — K644 Residual hemorrhoidal skin tags: Secondary | ICD-10-CM

## 2022-01-24 DIAGNOSIS — K449 Diaphragmatic hernia without obstruction or gangrene: Secondary | ICD-10-CM | POA: Diagnosis not present

## 2022-01-24 DIAGNOSIS — Z853 Personal history of malignant neoplasm of breast: Secondary | ICD-10-CM | POA: Insufficient documentation

## 2022-01-24 DIAGNOSIS — I4819 Other persistent atrial fibrillation: Secondary | ICD-10-CM | POA: Diagnosis not present

## 2022-01-24 DIAGNOSIS — M1990 Unspecified osteoarthritis, unspecified site: Secondary | ICD-10-CM | POA: Diagnosis not present

## 2022-01-24 DIAGNOSIS — I110 Hypertensive heart disease with heart failure: Secondary | ICD-10-CM | POA: Insufficient documentation

## 2022-01-24 DIAGNOSIS — I509 Heart failure, unspecified: Secondary | ICD-10-CM | POA: Diagnosis not present

## 2022-01-24 DIAGNOSIS — E785 Hyperlipidemia, unspecified: Secondary | ICD-10-CM | POA: Diagnosis not present

## 2022-01-24 DIAGNOSIS — I5022 Chronic systolic (congestive) heart failure: Secondary | ICD-10-CM | POA: Diagnosis not present

## 2022-01-24 DIAGNOSIS — G629 Polyneuropathy, unspecified: Secondary | ICD-10-CM | POA: Diagnosis not present

## 2022-01-24 DIAGNOSIS — K625 Hemorrhage of anus and rectum: Secondary | ICD-10-CM | POA: Insufficient documentation

## 2022-01-24 HISTORY — PX: FLEXIBLE SIGMOIDOSCOPY: SHX5431

## 2022-01-24 SURGERY — SIGMOIDOSCOPY, FLEXIBLE
Anesthesia: General

## 2022-01-24 MED ORDER — LIDOCAINE HCL (CARDIAC) PF 100 MG/5ML IV SOSY
PREFILLED_SYRINGE | INTRAVENOUS | Status: DC | PRN
  Administered 2022-01-24: 50 mg via INTRAVENOUS

## 2022-01-24 MED ORDER — PHENYLEPHRINE 80 MCG/ML (10ML) SYRINGE FOR IV PUSH (FOR BLOOD PRESSURE SUPPORT)
PREFILLED_SYRINGE | INTRAVENOUS | Status: DC | PRN
  Administered 2022-01-24: 80 ug via INTRAVENOUS

## 2022-01-24 MED ORDER — LACTATED RINGERS IV SOLN: INTRAVENOUS | Status: DC

## 2022-01-24 MED ORDER — PROPOFOL 10 MG/ML IV BOLUS
INTRAVENOUS | Status: DC | PRN
  Administered 2022-01-24: 80 mg via INTRAVENOUS

## 2022-01-24 NOTE — Interval H&P Note (Signed)
History and Physical Interval Note:  01/24/2022 1:49 PM  [name]  has presented today for surgery, with the diagnosis of Rectal bleeding.  The various methods of treatment have been discussed with the patient and family. After consideration of risks, benefits and other options for treatment, the patient has consented to  Procedure(s) with comments: FLEXIBLE SIGMOIDOSCOPY (N/A) - 245, moved up per [name] as a surgical intervention.  The patient's history has been reviewed, patient examined, no change in status, stable for surgery.  I have reviewed the patient's chart and labs.  Questions were answered to the patient's satisfaction.     [name]

## 2022-01-24 NOTE — Anesthesia Procedure Notes (Signed)
Date/Time: 01/24/2022 2:58 PM  Performed by: Orlie Dakin, CRNAPre-anesthesia Checklist: Patient identified, Emergency Drugs available, Suction available and Patient being monitored Patient Re-evaluated:Patient Re-evaluated prior to induction Oxygen Delivery Method: Nasal cannula Induction Type: IV induction Placement Confirmation: positive ETCO2

## 2022-01-24 NOTE — Discharge Instructions (Signed)
You are being discharged to home.  Use Preparation H every 12 hours. Use Miralax 2 capful every day to make bowel movements soft.

## 2022-01-24 NOTE — Anesthesia Postprocedure Evaluation (Signed)
Anesthesia Post Note  Patient: [name]  Procedure(s) Performed: [place]  Patient location during evaluation: Phase II Anesthesia Type: General Level of consciousness: awake and alert and oriented Pain management: pain level controlled Vital Signs Assessment: post-procedure vital signs reviewed and stable Respiratory status: spontaneous breathing, nonlabored ventilation and respiratory function stable Cardiovascular status: blood pressure returned to baseline and stable Postop Assessment: no apparent nausea or vomiting Anesthetic complications: no   No notable events documented.   Last Vitals:  Vitals:   01/24/22 1326 01/24/22 1506  BP: 117/80 120/62  Pulse: 77 63  Resp: 16 14  Temp: 36.7 C 36.5 C  SpO2: 96% 96%    Last Pain:  Vitals:   01/24/22 1506  TempSrc: Oral  PainSc: 0-No pain                 Wendell Nicoson C Enisa Runyan

## 2022-01-24 NOTE — Op Note (Signed)
[facility] Patient Name: [name] Procedure Date: 01/24/2022 2:46 PM MRN: [id number] Date of Birth: 1931/11/22 Attending MD: [name] ,  CSN: [id number] Age: 88 Admit Type: Outpatient Procedure:                Flexible Sigmoidoscopy Indications:              Rectal hemorrhage Providers:                [name], Crystal Page, Everardo Pacific,                            Aram Candela Referring MD:              Medicines:                Monitored Anesthesia Care Complications:            No immediate complications. Estimated Blood Loss:     Estimated blood loss: none. Procedure:                Pre-Anesthesia Assessment:                           - Prior to the procedure, a History and Physical                            was performed, and patient medications, allergies                            and sensitivities were reviewed. The patient's                            tolerance of previous anesthesia was reviewed.                           - The risks and benefits of the procedure and the                            sedation options and risks were discussed with the                            patient. All questions were answered and informed                            consent was obtained.                           - ASA Grade Assessment: III - A patient with severe                            systemic disease.                           After obtaining informed consent, the scope was                            passed under direct vision. The GIF-H190 ([id number])  scope was introduced through the anus and advanced                            to the the descending colon. The flexible                            sigmoidoscopy was accomplished without difficulty.                            The patient tolerated the procedure well. The                            quality of the bowel preparation was adequate. Scope In: 2:59:37 PM Scope Out:  3:01:46 PM Total Procedure Duration: 0 hours 2 minutes 9 seconds  Findings:      Both external and internal hemorrhoids were found on perianal exam. The       external hemorrhoids were medium in size. The internal hemorrhoids were       large in size but not indurated, they were reducible with rectal exam.      The colon (entire examined portion) appeared normal.      Non-bleeding internal hemorrhoids were found during retroflexion. The       hemorrhoids were large. Impression:               - Hemorrhoids found on perianal exam.                           - The entire examined colon is normal.                           - Non-bleeding internal hemorrhoids.                           - No specimens collected. Moderate Sedation:      Per Anesthesia Care Recommendation:           - Discharge patient to home (ambulatory).                           - Use Preparation H every 12 hours.                           - Use Miralax 2 capful every day to make bowel                            movements soft. Procedure Code(s):        --- Professional ---                           [phone], Sigmoidoscopy, flexible; diagnostic,                            including collection of specimen(s) by brushing or                            washing, when performed (separate procedure) Diagnosis Code(s):        ---  Professional ---                           K64.8, Other hemorrhoids                           K62.5, Hemorrhage of anus and rectum CPT copyright 2019 American Medical Association. All rights reserved. The codes documented in this report are preliminary and upon coder review may  be revised to meet current compliance requirements. [name], MD [name],  01/24/2022 3:12:53 PM This report has been signed electronically. Number of Addenda: 0

## 2022-01-24 NOTE — Anesthesia Preprocedure Evaluation (Signed)
Anesthesia Evaluation  Patient identified by MRN, date of birth, ID band Patient awake    Reviewed: Allergy & Precautions, NPO status , Patient's Chart, lab work & pertinent test results, reviewed documented beta blocker date and time   History of Anesthesia Complications (+) PONV and history of anesthetic complications  Airway Mallampati: II  TM Distance: >3 FB Neck ROM: Full    Dental  (+) Dental Advisory Given, Missing, Chipped   Pulmonary shortness of breath and with exertion, neg COPD,  COPD inhaler,    Pulmonary exam normal breath sounds clear to auscultation       Cardiovascular hypertension, Pt. on medications and Pt. on home beta blockers +CHF  + dysrhythmias Atrial Fibrillation and Supra Ventricular Tachycardia  Rhythm:Irregular Rate:Normal     Neuro/Psych Anxiety  Neuromuscular disease negative psych ROS   GI/Hepatic Neg liver ROS, hiatal hernia, GERD  Medicated,  Endo/Other  negative endocrine ROS  Renal/GU Renal disease  negative genitourinary   Musculoskeletal  (+) Arthritis ,   Abdominal   Peds negative pediatric ROS (+)  Hematology negative hematology ROS (+)   Anesthesia Other Findings Left breast cancer  Reproductive/Obstetrics negative OB ROS                            Anesthesia Physical Anesthesia Plan  ASA: 3  Anesthesia Plan: General   Post-op Pain Management: Minimal or no pain anticipated   Induction: Intravenous  PONV Risk Score and Plan: Propofol infusion  Airway Management Planned: Nasal Cannula and Natural Airway  Additional Equipment:   Intra-op Plan:   Post-operative Plan:   Informed Consent: I have reviewed the patients History and Physical, chart, labs and discussed the procedure including the risks, benefits and alternatives for the proposed anesthesia with the patient or authorized representative who has indicated his/her understanding and  acceptance.     Dental advisory given  Plan Discussed with: CRNA and Surgeon  Anesthesia Plan Comments:         Anesthesia Quick Evaluation

## 2022-01-24 NOTE — Transfer of Care (Signed)
Immediate Anesthesia Transfer of Care Note  Patient: [name]  Procedure(s) Performed: FLEXIBLE SIGMOIDOSCOPY  Patient Location: Short Stay  Anesthesia Type:General  Level of Consciousness: drowsy  Airway & Oxygen Therapy: Patient Spontanous Breathing  Post-op Assessment: Report given to RN and Post -op Vital signs reviewed and stable  Post vital signs: Reviewed and stable  Last Vitals:  Vitals Value Taken Time  BP    Temp    Pulse    Resp    SpO2      Last Pain:  Vitals:   01/24/22 1453  TempSrc:   PainSc: 0-No pain         Complications: No notable events documented.

## 2022-01-30 ENCOUNTER — Encounter (HOSPITAL_COMMUNITY): Payer: Self-pay | Admitting: Gastroenterology

## 2022-02-21 ENCOUNTER — Encounter (INDEPENDENT_AMBULATORY_CARE_PROVIDER_SITE_OTHER): Payer: Self-pay | Admitting: Ophthalmology

## 2022-02-21 ENCOUNTER — Ambulatory Visit (INDEPENDENT_AMBULATORY_CARE_PROVIDER_SITE_OTHER): Payer: PPO | Admitting: Ophthalmology

## 2022-02-21 DIAGNOSIS — H353124 Nonexudative age-related macular degeneration, left eye, advanced atrophic with subfoveal involvement: Secondary | ICD-10-CM

## 2022-02-21 DIAGNOSIS — H353221 Exudative age-related macular degeneration, left eye, with active choroidal neovascularization: Secondary | ICD-10-CM | POA: Diagnosis not present

## 2022-02-21 DIAGNOSIS — H353231 Exudative age-related macular degeneration, bilateral, with active choroidal neovascularization: Secondary | ICD-10-CM

## 2022-02-21 DIAGNOSIS — H353114 Nonexudative age-related macular degeneration, right eye, advanced atrophic with subfoveal involvement: Secondary | ICD-10-CM

## 2022-02-21 DIAGNOSIS — H353211 Exudative age-related macular degeneration, right eye, with active choroidal neovascularization: Secondary | ICD-10-CM

## 2022-02-21 MED ORDER — BEVACIZUMAB CHEMO INJECTION 1.25MG/0.05ML SYRINGE FOR KALEIDOSCOPE
1.2500 mg | INTRAVITREAL | Status: AC | PRN
  Administered 2022-02-21: 1.25 mg via INTRAVITREAL

## 2022-02-21 NOTE — Assessment & Plan Note (Signed)
Maintained stable vision and less active CNVM at week interval today.  We will also extend interval examination next OD

## 2022-02-21 NOTE — Assessment & Plan Note (Signed)
Extensive OS.  Patient encouraged to continue to attempt discontinuing smoking

## 2022-02-21 NOTE — Assessment & Plan Note (Signed)
OS doing well currently at 7-week follow-up interval.  Region temporally stable overall.  CME and intraretinal fluid is over region of large geographic atrophy.  Stable acuity

## 2022-02-21 NOTE — Progress Notes (Signed)
02/21/2022     CHIEF COMPLAINT Patient presents for  Chief Complaint  Patient presents with   Macular Degeneration      HISTORY OF PRESENT ILLNESS: [name] is a 88 y.o. female who presents to the clinic today for:   HPI   7 weeks for DILATE OU, AVASTIN OCT, OD, OS. Pt stated vision remained stable however, pt reported some pain during the previous injection in the left eye.  Last edited by Silvestre Moment on 02/21/2022  3:19 PM.      Referring physician: Asencion Noble, MD [address] [place],  [place] [address]  HISTORICAL INFORMATION:   Selected notes from the MEDICAL RECORD NUMBER       CURRENT MEDICATIONS: Current Outpatient Medications (Ophthalmic Drugs)  Medication Sig   dorzolamide-timolol (COSOPT) 22.3-6.8 MG/ML ophthalmic solution Place 1 drop into the right eye 2 (two) times daily.    Latanoprostene Bunod 0.024 % SOLN Place 1 drop into the right eye at bedtime.    RHOPRESSA 0.02 % SOLN Place 1 drop into both eyes at bedtime.   No current facility-administered medications for this visit. (Ophthalmic Drugs)   Current Outpatient Medications (Other)  Medication Sig   albuterol (VENTOLIN HFA) 108 (90 Base) MCG/ACT inhaler Inhale 2 puffs into the lungs every 4 (four) hours as needed for shortness of breath or wheezing.   carvedilol (COREG) 12.5 MG tablet Take 12.5 mg by mouth 2 (two) times daily with a meal.    ELIQUIS 5 MG TABS tablet TAKE (1) TABLET BY MOUTH TWICE DAILY FOR BLOOD THINNER.   furosemide (LASIX) 40 MG tablet TAKE ONE TABLET BY MOUTH ONCE DAILY. (Patient taking differently: Take 40 mg by mouth every Monday, Wednesday, and Friday.)   hydrocortisone (ANUSOL-HC) 2.5 % rectal cream Place 1 application. rectally 3 (three) times daily. USE THREE TIMES A DAY X2 WEEKS THEN AS NEEDED   losartan (COZAAR) 25 MG tablet Take 1 tablet (25 mg total) by mouth 2 (two) times daily.   mirabegron ER (MYRBETRIQ) 25 MG TB24 tablet Take 1 tablet (25 mg total) by mouth  daily. (Patient not taking: Reported on 12/26/2021)   Multiple Vitamins-Minerals (PRESERVISION AREDS 2 PO) Take 1 capsule by mouth in the morning and at bedtime.   pantoprazole (PROTONIX) 40 MG tablet TAKE ONE TABLET BY MOUTH DAILY BEFORE BREAKFAST FOR ACID REFLUX.   pramoxine-hydrocortisone (PROCTOCREAM-HC) 1-1 % rectal cream Place 1 application rectally 2 (two) times daily as needed for hemorrhoids or anal itching. (Patient not taking: Reported on 12/26/2021)   No current facility-administered medications for this visit. (Other)      REVIEW OF SYSTEMS: ROS   Negative for: Constitutional, Gastrointestinal, Neurological, Skin, Genitourinary, Musculoskeletal, HENT, Endocrine, Cardiovascular, Eyes, Respiratory, Psychiatric, Allergic/Imm, Heme/Lymph Last edited by Silvestre Moment on 02/21/2022  3:19 PM.       ALLERGIES Allergies  Allergen Reactions   Atorvastatin Other (See Comments)    Myalgias   Hydrocodone Other (See Comments)    GI distress.    PAST MEDICAL HISTORY Past Medical History:  Diagnosis Date   Anxiety    Arteriosclerotic cardiovascular disease (ASCVD)     cath Feb '07- no obstructive dz - 20% proximal LAD only; EF 65%; possible coronary artery spasm   Atrial fibrillation (HCC)    Paroxysmal on event recorder in 2009; regular supraventricular tachycardia at a rate of 150 also identified on that study representing either atrial flutter or PSVT; onset of persistent AF in 03/2011   Carcinoma of  breast ([place]) 2001   Left mastectomy with positive nodes   Chest pain    Long-standing and atypical   CHF (congestive heart failure) (HCC)    Chronic anticoagulation 04/12/2011   Dysrhythmia    GERD (gastroesophageal reflux disease)    hiatal hernia   Herpes zoster    Hyperlipidemia    Lipid profile in 11/2008:282, 176, 64, 201   Hypertension    diastolic dysfunction; normal CMet and TSH in 11/2009; normal CBC in 04/2010   Malignant neoplasm of breast (female), unspecified site  05/27/2013   right breast lumpectomy with positive node   Peripheral neuropathy    Polio 1946   at age 61   PONV (postoperative nausea and vomiting)    Rectal bleed    Renal insufficiency    Shortness of breath dyspnea    Vitreous hemorrhage of right eye ([place]) 10/23/2019   Past Surgical History:  Procedure Laterality Date   BREAST LUMPECTOMY WITH NEEDLE LOCALIZATION AND AXILLARY SENTINEL LYMPH NODE BX Right 05/27/2013   Procedure: BREAST LUMPECTOMY WITH NEEDLE LOCALIZATION AND AXILLARY SENTINEL LYMPH NODE BX;  Surgeon: Pedro Earls, MD;  Location: [facility];  Service: General;  Laterality: Right;   [place]   COLONOSCOPY  02/22/2011   Procedure: COLONOSCOPY;  Surgeon: Rogene Houston, MD;  [facility];  Service: Endoscopy;  Laterality: N/A;; performed for scant hematochezia   DILATION AND CURETTAGE OF UTERUS     ESOPHAGOGASTRODUODENOSCOPY  02/22/2011   Procedure: ESOPHAGOGASTRODUODENOSCOPY (EGD);  Surgeon: Rogene Houston, MD;  [facility];  Service: Endoscopy;  Laterality: N/A;   ESOPHAGOGASTRODUODENOSCOPY N/A 06/02/2014   Procedure: ESOPHAGOGASTRODUODENOSCOPY (EGD);  Surgeon: Rogene Houston, MD;  [facility];  Service: Endoscopy;  Laterality: N/A;  730   EYE SURGERY     both cataracts   FLEXIBLE SIGMOIDOSCOPY N/A 01/24/2022   Procedure: FLEXIBLE SIGMOIDOSCOPY;  Surgeon: Harvel Quale, MD;  [facility];  Service: Gastroenterology;  Laterality: N/A;  245, moved up per Sherrie Sport DILATION N/A 06/02/2014   Procedure: Venia Minks DILATION;  Surgeon: Rogene Houston, MD;  [facility];  Service: Endoscopy;  Laterality: N/A;   MASTECTOMY  2001   Left;modified radical with lymph node dissection   PORT-A-CATH REMOVAL     pac in and out 2002    FAMILY HISTORY Family History  Problem Relation Age of Onset   Cancer Sister        lung   Cancer Brother         lipoma   Cancer Brother        kidney    SOCIAL HISTORY Social History   Tobacco Use   Smoking status: Never    Passive exposure: Never   Smokeless tobacco: Never  Vaping Use   Vaping Use: Never used  Substance Use Topics   Alcohol use: No    Alcohol/week: 0.0 standard drinks of alcohol   Drug use: No         OPHTHALMIC EXAM:  Base Eye Exam     Visual Acuity (ETDRS)       Right Left   Dist [place] 20/60 -2 20/100 -2   Dist ph [place] NI NI         Tonometry (Tonopen, 3:23 PM)       Right Left   Pressure 10 12         Pupils  Pupils APD   Right PERRL None   Left PERRL None         Visual Fields       Left Right    Full    Restrictions  Partial outer superior nasal deficiency         Extraocular Movement       Right Left    Full Full         Neuro/Psych     Oriented x3: Yes   Mood/Affect: Normal         Dilation     Both eyes: 1.0% Mydriacyl, 2.5% Phenylephrine @ 3:23 PM           Slit Lamp and Fundus Exam     External Exam       Right Left   External Normal Normal         Slit Lamp Exam       Right Left   Lids/Lashes Normal Normal   Conjunctiva/Sclera White and quiet White and quiet   Cornea Clear Clear   Anterior Chamber Deep and quiet Deep and quiet   Iris Round and reactive Round and reactive   Lens Posterior chamber intraocular lens, Open posterior capsule Posterior chamber intraocular lens   Anterior Vitreous Normal Normal         Fundus Exam       Right Left   Posterior Vitreous clear , vitrectomized Posterior vitreous detachment   Disc Normal Normal   C/D Ratio 0.35 0.3   Macula Geographic atrophy, Intermediate age related macular degeneration, Subretinal hemorrhage thin slightly smaller, paramacular Disciform scar, [place], no exudates, no hemorrhage, no macular thickening, Mottling, Retinal pigment epithelial mottling, Atrophy, Retinal pigment epithelial atrophy, Age related macular degeneration,  Early age related macular degeneration, Geographic atrophy near the FAZ, Pigmented atrophy, Hard drusen   Vessels Normal Normal   Periphery Normal, no holes Normal, no holes            IMAGING AND PROCEDURES  Imaging and Procedures for 02/21/22  OCT, Retina - OU - Both Eyes       Right Eye Quality was borderline. Scan locations included subfoveal. Central Foveal Thickness: 332. Progression has been stable. Findings include abnormal foveal contour, pigment epithelial detachment.   Left Eye Quality was good. Scan locations included subfoveal. Central Foveal Thickness: 316. Progression has been stable. Findings include abnormal foveal contour, cystoid macular edema, intraretinal fluid.   Notes Much less overall thickening and subretinal fluid and edema and intraretinal fluid post injection Avastin.  OD, only 7 weeks post most recent injection, slightly less active, will need to continue with therapy today OD as there is hemorrhage clinically  OS, now 7 week postinjection OS repeat injection today, with persistent subretinal fluid temporal OS  OU we will extend interval examination next     Intravitreal Injection, Pharmacologic Agent - OD - Right Eye       Time Out 02/21/2022. 3:56 PM. Confirmed correct patient, procedure, site, and patient consented.   Anesthesia Topical anesthesia was used. Anesthetic medications included Lidocaine 4%.   Procedure Preparation included 5% betadine to ocular surface, 10% betadine to eyelids, Tobramycin 0.3%, Ofloxacin . A 30 gauge needle was used.   Injection: 1.25 mg Bevacizumab 1.'25mg'$ /0.70m   Route: Intravitreal, Site: Right Eye   NDC: 5H061816 Lot:: [id number] Expiration date: 05/12/2022   Post-op Post injection exam found visual acuity of at least counting fingers. The patient tolerated the procedure well. There were no complications. The patient  received written and verbal post procedure care education. Post injection  medications included ocuflox.      Intravitreal Injection, Pharmacologic Agent - OS - Left Eye       Time Out 02/21/2022. 3:55 PM. Confirmed correct patient, procedure, site, and patient consented.   Anesthesia Topical anesthesia was used. Anesthetic medications included Lidocaine 4%.   Procedure Preparation included 5% betadine to ocular surface, 10% betadine to eyelids, Ofloxacin . A supplied needle was used.   Injection: 1.25 mg Bevacizumab 1.'25mg'$ /0.72m   Route: Intravitreal, Site: Left Eye   NDC: 5H061816 Lot:: [id number] Expiration date: 05/12/2022   Post-op Post injection exam found visual acuity of at least counting fingers. The patient tolerated the procedure well. There were no complications. The patient received written and verbal post procedure care education. Post injection medications included ocuflox.   Notes Numbing and treatment delivered superotemporal quadrant to enhance comfort             ASSESSMENT/PLAN:  Exudative age-related macular degeneration of left eye with active choroidal neovascularization (HCC) OS doing well currently at 7-week follow-up interval.  Region temporally stable overall.  CME and intraretinal fluid is over region of large geographic atrophy.  Stable acuity  Exudative age-related macular degeneration of right eye with active choroidal neovascularization (HCC) Maintained stable vision and less active CNVM at week interval today.  We will also extend interval examination next OD  Advanced nonexudative age-related macular degeneration of left eye with subfoveal involvement Extensive OS.  Patient encouraged to continue to attempt discontinuing smoking  Advanced nonexudative age-related macular degeneration of right eye with subfoveal involvement Extensive perifoveal OD     ICD-10-CM   1. Exudative age-related macular degeneration of left eye with active choroidal neovascularization (HCC)  H35.3221 OCT, Retina - OU - Both  Eyes    Intravitreal Injection, Pharmacologic Agent - OS - Left Eye    Bevacizumab (AVASTIN) SOLN 1.25 mg    2. Exudative age-related macular degeneration of right eye with active choroidal neovascularization (HCC)  H35.3211 OCT, Retina - OU - Both Eyes    Intravitreal Injection, Pharmacologic Agent - OD - Right Eye    Bevacizumab (AVASTIN) SOLN 1.25 mg    3. Advanced nonexudative age-related macular degeneration of left eye with subfoveal involvement  H35.3124     4. Advanced nonexudative age-related macular degeneration of right eye with subfoveal involvement  H35.3114       1.  2.  3.  Ophthalmic Meds Ordered this visit:  Meds ordered this encounter  Medications   Bevacizumab (AVASTIN) SOLN 1.25 mg   Bevacizumab (AVASTIN) SOLN 1.25 mg       Return in about 10 weeks (around 05/02/2022) for DILATE OU, AVASTIN OCT, OD, OS.  There are no Patient Instructions on file for this visit.   Explained the diagnoses, plan, and follow up with the patient and they expressed understanding.  Patient expressed understanding of the importance of proper follow up care.   GClent DemarkRankin M.D. Diseases & Surgery of the Retina and Vitreous Retina & Diabetic EW[place]08/15/23     Abbreviations: M myopia (nearsighted); A astigmatism; H hyperopia (farsighted); P presbyopia; Mrx spectacle prescription;  CTL contact lenses; OD right eye; OS left eye; OU both eyes  XT exotropia; ET esotropia; PEK punctate epithelial keratitis; PEE punctate epithelial erosions; DES dry eye syndrome; MGD meibomian gland dysfunction; ATs artificial tears; PFAT's preservative free artificial tears; NNuckollsnuclear sclerotic cataract; PSC posterior subcapsular cataract; ERM epi-retinal membrane; PVD posterior vitreous  detachment; RD retinal detachment; DM diabetes mellitus; DR diabetic retinopathy; NPDR non-proliferative diabetic retinopathy; PDR proliferative diabetic retinopathy; CSME clinically significant macular  edema; DME diabetic macular edema; dbh dot blot hemorrhages; CWS cotton wool spot; POAG primary open angle glaucoma; C/D cup-to-disc ratio; HVF humphrey visual field; GVF goldmann visual field; OCT optical coherence tomography; IOP intraocular pressure; BRVO Branch retinal vein occlusion; CRVO central retinal vein occlusion; CRAO central retinal artery occlusion; BRAO branch retinal artery occlusion; RT retinal tear; SB scleral buckle; PPV pars plana vitrectomy; VH Vitreous hemorrhage; PRP panretinal laser photocoagulation; IVK intravitreal kenalog; VMT vitreomacular traction; MH Macular hole;  NVD neovascularization of the disc; NVE neovascularization elsewhere; AREDS age related eye disease study; ARMD age related macular degeneration; POAG primary open angle glaucoma; EBMD epithelial/anterior basement membrane dystrophy; ACIOL anterior chamber intraocular lens; IOL intraocular lens; PCIOL posterior chamber intraocular lens; Phaco/IOL phacoemulsification with intraocular lens placement; [place] photorefractive keratectomy; LASIK laser assisted in situ keratomileusis; HTN hypertension; DM diabetes mellitus; COPD chronic obstructive pulmonary disease

## 2022-02-21 NOTE — Assessment & Plan Note (Signed)
Extensive perifoveal OD

## 2022-03-21 DIAGNOSIS — Z79899 Other long term (current) drug therapy: Secondary | ICD-10-CM | POA: Diagnosis not present

## 2022-03-21 DIAGNOSIS — Z961 Presence of intraocular lens: Secondary | ICD-10-CM | POA: Diagnosis not present

## 2022-03-21 DIAGNOSIS — H40002 Preglaucoma, unspecified, left eye: Secondary | ICD-10-CM | POA: Diagnosis not present

## 2022-03-21 DIAGNOSIS — H401112 Primary open-angle glaucoma, right eye, moderate stage: Secondary | ICD-10-CM | POA: Diagnosis not present

## 2022-03-21 DIAGNOSIS — I10 Essential (primary) hypertension: Secondary | ICD-10-CM | POA: Diagnosis not present

## 2022-03-21 DIAGNOSIS — H353231 Exudative age-related macular degeneration, bilateral, with active choroidal neovascularization: Secondary | ICD-10-CM | POA: Diagnosis not present

## 2022-03-28 DIAGNOSIS — I480 Paroxysmal atrial fibrillation: Secondary | ICD-10-CM | POA: Diagnosis not present

## 2022-03-28 DIAGNOSIS — I5032 Chronic diastolic (congestive) heart failure: Secondary | ICD-10-CM | POA: Diagnosis not present

## 2022-03-28 DIAGNOSIS — N1831 Chronic kidney disease, stage 3a: Secondary | ICD-10-CM | POA: Diagnosis not present

## 2022-03-28 DIAGNOSIS — U071 COVID-19: Secondary | ICD-10-CM | POA: Diagnosis not present

## 2022-05-02 ENCOUNTER — Encounter (INDEPENDENT_AMBULATORY_CARE_PROVIDER_SITE_OTHER): Payer: PPO | Admitting: Ophthalmology

## 2022-05-11 DIAGNOSIS — H43812 Vitreous degeneration, left eye: Secondary | ICD-10-CM | POA: Diagnosis not present

## 2022-05-11 DIAGNOSIS — H35721 Serous detachment of retinal pigment epithelium, right eye: Secondary | ICD-10-CM | POA: Diagnosis not present

## 2022-05-11 DIAGNOSIS — H353231 Exudative age-related macular degeneration, bilateral, with active choroidal neovascularization: Secondary | ICD-10-CM | POA: Diagnosis not present

## 2022-05-11 DIAGNOSIS — H353113 Nonexudative age-related macular degeneration, right eye, advanced atrophic without subfoveal involvement: Secondary | ICD-10-CM | POA: Diagnosis not present

## 2022-05-11 DIAGNOSIS — H353124 Nonexudative age-related macular degeneration, left eye, advanced atrophic with subfoveal involvement: Secondary | ICD-10-CM | POA: Diagnosis not present

## 2022-05-22 ENCOUNTER — Telehealth (INDEPENDENT_AMBULATORY_CARE_PROVIDER_SITE_OTHER): Payer: Self-pay | Admitting: *Deleted

## 2022-05-22 NOTE — Telephone Encounter (Signed)
Please send a referral to [facility] for evaluation. Dx: internal and external hemorrhoids

## 2022-05-22 NOTE — Telephone Encounter (Signed)
Patient called in, said you mentioned she may need surgery and she's decided to proceed, wants to see Dr Hassell Done ([facility]), please advise

## 2022-06-22 DIAGNOSIS — K642 Third degree hemorrhoids: Secondary | ICD-10-CM | POA: Diagnosis not present

## 2022-06-22 DIAGNOSIS — H353221 Exudative age-related macular degeneration, left eye, with active choroidal neovascularization: Secondary | ICD-10-CM | POA: Diagnosis not present

## 2022-06-22 DIAGNOSIS — H353124 Nonexudative age-related macular degeneration, left eye, advanced atrophic with subfoveal involvement: Secondary | ICD-10-CM | POA: Diagnosis not present

## 2022-06-22 DIAGNOSIS — H353113 Nonexudative age-related macular degeneration, right eye, advanced atrophic without subfoveal involvement: Secondary | ICD-10-CM | POA: Diagnosis not present

## 2022-06-22 DIAGNOSIS — H353231 Exudative age-related macular degeneration, bilateral, with active choroidal neovascularization: Secondary | ICD-10-CM | POA: Diagnosis not present

## 2022-06-22 DIAGNOSIS — H43812 Vitreous degeneration, left eye: Secondary | ICD-10-CM | POA: Diagnosis not present

## 2022-06-22 DIAGNOSIS — H35721 Serous detachment of retinal pigment epithelium, right eye: Secondary | ICD-10-CM | POA: Diagnosis not present

## 2022-07-12 ENCOUNTER — Other Ambulatory Visit: Payer: Self-pay | Admitting: Internal Medicine

## 2022-07-24 DIAGNOSIS — I4821 Permanent atrial fibrillation: Secondary | ICD-10-CM | POA: Diagnosis not present

## 2022-07-24 DIAGNOSIS — N1831 Chronic kidney disease, stage 3a: Secondary | ICD-10-CM | POA: Diagnosis not present

## 2022-07-24 DIAGNOSIS — I5032 Chronic diastolic (congestive) heart failure: Secondary | ICD-10-CM | POA: Diagnosis not present

## 2022-07-24 DIAGNOSIS — Z79899 Other long term (current) drug therapy: Secondary | ICD-10-CM | POA: Diagnosis not present

## 2022-07-25 DIAGNOSIS — H353231 Exudative age-related macular degeneration, bilateral, with active choroidal neovascularization: Secondary | ICD-10-CM | POA: Diagnosis not present

## 2022-07-25 DIAGNOSIS — H401112 Primary open-angle glaucoma, right eye, moderate stage: Secondary | ICD-10-CM | POA: Diagnosis not present

## 2022-08-01 DIAGNOSIS — K625 Hemorrhage of anus and rectum: Secondary | ICD-10-CM | POA: Diagnosis not present

## 2022-08-01 DIAGNOSIS — I5033 Acute on chronic diastolic (congestive) heart failure: Secondary | ICD-10-CM | POA: Diagnosis not present

## 2022-08-03 DIAGNOSIS — H35721 Serous detachment of retinal pigment epithelium, right eye: Secondary | ICD-10-CM | POA: Diagnosis not present

## 2022-08-03 DIAGNOSIS — H43812 Vitreous degeneration, left eye: Secondary | ICD-10-CM | POA: Diagnosis not present

## 2022-08-03 DIAGNOSIS — H353231 Exudative age-related macular degeneration, bilateral, with active choroidal neovascularization: Secondary | ICD-10-CM | POA: Diagnosis not present

## 2022-08-03 DIAGNOSIS — H401130 Primary open-angle glaucoma, bilateral, stage unspecified: Secondary | ICD-10-CM | POA: Diagnosis not present

## 2022-08-08 DIAGNOSIS — I5033 Acute on chronic diastolic (congestive) heart failure: Secondary | ICD-10-CM | POA: Diagnosis not present

## 2022-08-10 DIAGNOSIS — K642 Third degree hemorrhoids: Secondary | ICD-10-CM | POA: Diagnosis not present

## 2022-08-17 DIAGNOSIS — B078 Other viral warts: Secondary | ICD-10-CM | POA: Diagnosis not present

## 2022-08-17 DIAGNOSIS — L570 Actinic keratosis: Secondary | ICD-10-CM | POA: Diagnosis not present

## 2022-08-17 DIAGNOSIS — X32XXXD Exposure to sunlight, subsequent encounter: Secondary | ICD-10-CM | POA: Diagnosis not present

## 2022-08-17 DIAGNOSIS — I781 Nevus, non-neoplastic: Secondary | ICD-10-CM | POA: Diagnosis not present

## 2022-08-17 DIAGNOSIS — C44622 Squamous cell carcinoma of skin of right upper limb, including shoulder: Secondary | ICD-10-CM | POA: Diagnosis not present

## 2022-08-22 DIAGNOSIS — I5033 Acute on chronic diastolic (congestive) heart failure: Secondary | ICD-10-CM | POA: Diagnosis not present

## 2022-09-08 ENCOUNTER — Other Ambulatory Visit: Payer: Self-pay | Admitting: Internal Medicine

## 2022-09-14 DIAGNOSIS — H353231 Exudative age-related macular degeneration, bilateral, with active choroidal neovascularization: Secondary | ICD-10-CM | POA: Diagnosis not present

## 2022-09-14 DIAGNOSIS — H35721 Serous detachment of retinal pigment epithelium, right eye: Secondary | ICD-10-CM | POA: Diagnosis not present

## 2022-09-14 DIAGNOSIS — H43812 Vitreous degeneration, left eye: Secondary | ICD-10-CM | POA: Diagnosis not present

## 2022-09-19 ENCOUNTER — Ambulatory Visit: Payer: PPO | Attending: Internal Medicine | Admitting: Internal Medicine

## 2022-09-19 ENCOUNTER — Encounter: Payer: Self-pay | Admitting: Internal Medicine

## 2022-09-19 ENCOUNTER — Other Ambulatory Visit: Payer: Self-pay | Admitting: *Deleted

## 2022-09-19 VITALS — BP 98/64 | HR 66 | Ht 60.0 in | Wt 167.0 lb

## 2022-09-19 DIAGNOSIS — I5033 Acute on chronic diastolic (congestive) heart failure: Secondary | ICD-10-CM

## 2022-09-19 MED ORDER — APIXABAN 2.5 MG PO TABS: 2.5000 mg | ORAL_TABLET | Freq: Two times a day (BID) | ORAL | 5 refills | Status: AC

## 2022-09-19 MED ORDER — ENTRESTO 24-26 MG PO TABS: 1.0000 | ORAL_TABLET | Freq: Two times a day (BID) | ORAL | 11 refills | Status: DC

## 2022-09-19 MED ORDER — CARVEDILOL 6.25 MG PO TABS: 6.2500 mg | ORAL_TABLET | Freq: Two times a day (BID) | ORAL | 3 refills | Status: DC

## 2022-09-19 NOTE — Progress Notes (Signed)
HPI [name] returns today for followup. She is a pleasant 89 yo woman with a h/o  HTN, chronic diastolic heart failure, and chronic atrial fib. She has class 2 symptoms. She has mild LV dysfunction. She takes her lasix 3 times a week but still complains about being unable to go out. She has not had syncope. No chest pain. She has mild class 2 peripheral edema.   Allergies  Allergen Reactions   Atorvastatin Other (See Comments)    Myalgias   Hydrocodone Other (See Comments)    GI distress.     Current Outpatient Medications  Medication Sig Dispense Refill   albuterol (VENTOLIN HFA) 108 (90 Base) MCG/ACT inhaler Inhale 2 puffs into the lungs every 4 (four) hours as needed for shortness of breath or wheezing.     carvedilol (COREG) 12.5 MG tablet Take 12.5 mg by mouth 2 (two) times daily with a meal.      dorzolamide-timolol (COSOPT) 22.3-6.8 MG/ML ophthalmic solution Place 1 drop into the right eye 2 (two) times daily.      ELIQUIS 5 MG TABS tablet TAKE (1) TABLET BY MOUTH TWICE DAILY FOR BLOOD THINNER. 60 tablet 5   furosemide (LASIX) 40 MG tablet TAKE ONE TABLET BY MOUTH ONCE DAILY. 30 tablet 0   hydrocortisone (ANUSOL-HC) 2.5 % rectal cream Place 1 application. rectally 3 (three) times daily. USE THREE TIMES A DAY X2 WEEKS THEN AS NEEDED 70 g 1   Latanoprostene Bunod 0.024 % SOLN Place 1 drop into the right eye at bedtime.      losartan (COZAAR) 25 MG tablet Take 1 tablet (25 mg total) by mouth 2 (two) times daily. 90 tablet 3   Multiple Vitamins-Minerals (PRESERVISION AREDS 2 PO) Take 1 capsule by mouth in the morning and at bedtime.     pantoprazole (PROTONIX) 40 MG tablet TAKE ONE TABLET BY MOUTH DAILY BEFORE BREAKFAST FOR ACID REFLUX. 90 tablet 3   RHOPRESSA 0.02 % SOLN Place 1 drop into both eyes at bedtime.     mirabegron ER (MYRBETRIQ) 25 MG TB24 tablet Take 1 tablet (25 mg total) by mouth daily. (Patient not taking: Reported on 12/26/2021) 30 tablet 0    pramoxine-hydrocortisone (PROCTOCREAM-HC) 1-1 % rectal cream Place 1 application rectally 2 (two) times daily as needed for hemorrhoids or anal itching. (Patient not taking: Reported on 12/26/2021) 30 g 1   No current facility-administered medications for this visit.     Past Medical History:  Diagnosis Date   Anxiety    Arteriosclerotic cardiovascular disease (ASCVD)     cath Feb '07- no obstructive dz - 20% proximal LAD only; EF 65%; possible coronary artery spasm   Atrial fibrillation (HCC)    Paroxysmal on event recorder in 2009; regular supraventricular tachycardia at a rate of 150 also identified on that study representing either atrial flutter or PSVT; onset of persistent AF in 03/2011   Carcinoma of breast ([place]) 2001   Left mastectomy with positive nodes   Chest pain    Long-standing and atypical   CHF (congestive heart failure) (HCC)    Chronic anticoagulation 04/12/2011   Dysrhythmia    GERD (gastroesophageal reflux disease)    hiatal hernia   Herpes zoster    Hyperlipidemia    Lipid profile in 11/2008:282, 176, 64, 201   Hypertension    diastolic dysfunction; normal CMet and TSH in 11/2009; normal CBC in 04/2010   Malignant neoplasm of breast (female), unspecified site 05/27/2013  right breast lumpectomy with positive node   Peripheral neuropathy    Polio 1946   at age 55   PONV (postoperative nausea and vomiting)    Rectal bleed    Renal insufficiency    Shortness of breath dyspnea    Vitreous hemorrhage of right eye ([place]) 10/23/2019    ROS:   All systems reviewed and negative except as noted in the HPI.   Past Surgical History:  Procedure Laterality Date   BREAST LUMPECTOMY WITH NEEDLE LOCALIZATION AND AXILLARY SENTINEL LYMPH NODE BX Right 05/27/2013   Procedure: BREAST LUMPECTOMY WITH NEEDLE LOCALIZATION AND AXILLARY SENTINEL LYMPH NODE BX;  Surgeon: Pedro Earls, MD;  Location: [facility];  Service: General;  Laterality: Right;   [place]   COLONOSCOPY  02/22/2011   Procedure: COLONOSCOPY;  Surgeon: Rogene Houston, MD;  [facility];  Service: Endoscopy;  Laterality: N/A;; performed for scant hematochezia   DILATION AND CURETTAGE OF UTERUS     ESOPHAGOGASTRODUODENOSCOPY  02/22/2011   Procedure: ESOPHAGOGASTRODUODENOSCOPY (EGD);  Surgeon: Rogene Houston, MD;  [facility];  Service: Endoscopy;  Laterality: N/A;   ESOPHAGOGASTRODUODENOSCOPY N/A 06/02/2014   Procedure: ESOPHAGOGASTRODUODENOSCOPY (EGD);  Surgeon: Rogene Houston, MD;  [facility];  Service: Endoscopy;  Laterality: N/A;  730   EYE SURGERY     both cataracts   FLEXIBLE SIGMOIDOSCOPY N/A 01/24/2022   Procedure: FLEXIBLE SIGMOIDOSCOPY;  Surgeon: Harvel Quale, MD;  [facility];  Service: Gastroenterology;  Laterality: N/A;  245, moved up per Sherrie Sport DILATION N/A 06/02/2014   Procedure: Venia Minks DILATION;  Surgeon: Rogene Houston, MD;  [facility];  Service: Endoscopy;  Laterality: N/A;   MASTECTOMY  2001   Left;modified radical with lymph node dissection   PORT-A-CATH REMOVAL     pac in and out 2002     Family History  Problem Relation Age of Onset   Cancer Sister        lung   Cancer Brother        lipoma   Cancer Brother        kidney     Social History   Socioeconomic History   Marital status: Widowed    Spouse name: Not on file   Number of children: 2   Years of education: Not on file   Highest education level: Not on file  Occupational History   Occupation: homemaker  Tobacco Use   Smoking status: Never    Passive exposure: Never   Smokeless tobacco: Never  Vaping Use   Vaping Use: Never used  Substance and Sexual Activity   Alcohol use: No    Alcohol/week: 0.0 standard drinks of alcohol   Drug use: No   Sexual activity: Not Currently    Birth control/protection: Post-menopausal  Other Topics Concern   Not on file   Social History Narrative   Not on file   Social Determinants of Health   Financial Resource Strain: Not on file  Food Insecurity: Not on file  Transportation Needs: Not on file  Physical Activity: Not on file  Stress: Not on file  Social Connections: Not on file  Intimate Partner Violence: Not on file     BP 98/64   Pulse 66   Ht 5' (1.524 m)   Wt 167 lb (75.8 kg)   SpO2 92%   BMI 32.61 kg/m  Physical Exam:  Well appearing NAD HEENT: Unremarkable Neck:  No JVD, no thyromegally Lymphatics:  No adenopathy Back:  No CVA tenderness Lungs:  Clear HEART:  Regular rate rhythm, no murmurs, no rubs, no clicks Abd:  soft, positive bowel sounds, no organomegally, no rebound, no guarding Ext:  2 plus pulses, no edema, no cyanosis, no clubbing Skin:  No rashes no nodules Neuro:  CN II through XII intact, motor grossly intact   DEVICE  Normal device function.  See PaceArt for details.   Assess/Plan:  1. Chronic diastolic heart failure - her symptoms are class 3. I encouraged her to avoid salty food and increase her physical activity. I have asked her to stop the losartan and start entresto 24/26. Also she will take her lasix daily, and decrease the coreg down to 6.25 bid.   2. Perm atrial fib - her VR is well controlled. She denies palpitations.   3. HTN - her bp is well controlled. No change in meds.   4. Obesity - she has a bmi of 32. She is encouraged to lose weight another 15 lbs. 5. Bleeding -I asked her to reduce her eliquis to 2.5 bid as she has had some GI bleeding and is very close to being on 2.5 bid of eliquis any way.   Carleene Overlie Camryn Quesinberry,MD

## 2022-09-19 NOTE — Telephone Encounter (Signed)
Prescription refill request for Eliquis received. Indication: AF Last office visit: 09/19/22  Beckie Salts MD Scr: 0.99 on 01/18/22 Age: 89 Weight: 75.8kg  Eliquis decreased to 2.'5mg'$  twice daily at today's office visit by Dr Lovena Le.  Was in hospital recently with rectal bleeding.  New Rx sent in.

## 2022-09-19 NOTE — Patient Instructions (Signed)
Medication Instructions:  Your physician has recommended you make the following change in your medication:   Decrease Coreg to 6.25 mg Two Times Daily  Stop Taking Losartan  Start Taking Entresto 24-26 mg Two Times Daily  Decrease Eliquis to 2.5 mg Two Times Daily   *If you need a refill on your cardiac medications before your next appointment, please call your pharmacy*   Lab Work: NONE   If you have labs (blood work) drawn today and your tests are completely normal, you will receive your results only by: [place] (if you have MyChart) OR A paper copy in the mail If you have any lab test that is abnormal or we need to change your treatment, we will call you to review the results.   Testing/Procedures: NONE    Follow-Up: At [facility], you and your health needs are our priority.  As part of our continuing mission to provide you with exceptional heart care, we have created designated Provider Care Teams.  These Care Teams include your primary Cardiologist (physician) and Advanced Practice Providers (APPs -  Physician Assistants and Nurse Practitioners) who all work together to provide you with the care you need, when you need it.  We recommend signing up for the patient portal called "MyChart".  Sign up information is provided on this After Visit Summary.  MyChart is used to connect with patients for Virtual Visits (Telemedicine).  Patients are able to view lab/test results, encounter notes, upcoming appointments, etc.  Non-urgent messages can be sent to your provider as well.   To learn more about what you can do with MyChart, go to [email or web address].    Your next appointment:   3 month(s)  Provider:   Cristopher Peru, MD    Other Instructions Thank you for choosing [facility]!

## 2022-09-22 DIAGNOSIS — I5032 Chronic diastolic (congestive) heart failure: Secondary | ICD-10-CM | POA: Diagnosis not present

## 2022-09-22 DIAGNOSIS — N1831 Chronic kidney disease, stage 3a: Secondary | ICD-10-CM | POA: Diagnosis not present

## 2022-09-28 ENCOUNTER — Ambulatory Visit (INDEPENDENT_AMBULATORY_CARE_PROVIDER_SITE_OTHER): Payer: PPO | Admitting: Gastroenterology

## 2022-09-28 ENCOUNTER — Telehealth (INDEPENDENT_AMBULATORY_CARE_PROVIDER_SITE_OTHER): Payer: Self-pay | Admitting: Gastroenterology

## 2022-09-28 ENCOUNTER — Encounter (INDEPENDENT_AMBULATORY_CARE_PROVIDER_SITE_OTHER): Payer: Self-pay | Admitting: Gastroenterology

## 2022-09-28 DIAGNOSIS — D485 Neoplasm of uncertain behavior of skin: Secondary | ICD-10-CM | POA: Diagnosis not present

## 2022-09-28 DIAGNOSIS — C4441 Basal cell carcinoma of skin of scalp and neck: Secondary | ICD-10-CM | POA: Diagnosis not present

## 2022-09-28 DIAGNOSIS — L910 Hypertrophic scar: Secondary | ICD-10-CM | POA: Diagnosis not present

## 2022-09-28 NOTE — Telephone Encounter (Signed)
Granddaughter called stated patient was unaware of appointment today-shows where a message was left on her answering machine-stated patient is being seen by another doctor. Thanked her for calling us.

## 2022-10-05 DIAGNOSIS — N1832 Chronic kidney disease, stage 3b: Secondary | ICD-10-CM | POA: Diagnosis not present

## 2022-10-05 DIAGNOSIS — I10 Essential (primary) hypertension: Secondary | ICD-10-CM | POA: Diagnosis not present

## 2022-10-05 DIAGNOSIS — I5033 Acute on chronic diastolic (congestive) heart failure: Secondary | ICD-10-CM | POA: Diagnosis not present

## 2022-10-12 DIAGNOSIS — R1031 Right lower quadrant pain: Secondary | ICD-10-CM | POA: Diagnosis not present

## 2022-10-12 DIAGNOSIS — I5033 Acute on chronic diastolic (congestive) heart failure: Secondary | ICD-10-CM | POA: Diagnosis not present

## 2022-11-02 DIAGNOSIS — H43812 Vitreous degeneration, left eye: Secondary | ICD-10-CM | POA: Diagnosis not present

## 2022-11-02 DIAGNOSIS — H353124 Nonexudative age-related macular degeneration, left eye, advanced atrophic with subfoveal involvement: Secondary | ICD-10-CM | POA: Diagnosis not present

## 2022-11-02 DIAGNOSIS — H35721 Serous detachment of retinal pigment epithelium, right eye: Secondary | ICD-10-CM | POA: Diagnosis not present

## 2022-11-02 DIAGNOSIS — H353231 Exudative age-related macular degeneration, bilateral, with active choroidal neovascularization: Secondary | ICD-10-CM | POA: Diagnosis not present

## 2022-11-02 DIAGNOSIS — H401130 Primary open-angle glaucoma, bilateral, stage unspecified: Secondary | ICD-10-CM | POA: Diagnosis not present

## 2022-11-02 DIAGNOSIS — H353113 Nonexudative age-related macular degeneration, right eye, advanced atrophic without subfoveal involvement: Secondary | ICD-10-CM | POA: Diagnosis not present

## 2022-11-08 DIAGNOSIS — I4821 Permanent atrial fibrillation: Secondary | ICD-10-CM | POA: Diagnosis not present

## 2022-11-08 DIAGNOSIS — Z79899 Other long term (current) drug therapy: Secondary | ICD-10-CM | POA: Diagnosis not present

## 2022-11-08 DIAGNOSIS — N1831 Chronic kidney disease, stage 3a: Secondary | ICD-10-CM | POA: Diagnosis not present

## 2022-11-08 DIAGNOSIS — I5032 Chronic diastolic (congestive) heart failure: Secondary | ICD-10-CM | POA: Diagnosis not present

## 2022-11-13 DIAGNOSIS — K625 Hemorrhage of anus and rectum: Secondary | ICD-10-CM | POA: Diagnosis not present

## 2022-11-13 DIAGNOSIS — I5022 Chronic systolic (congestive) heart failure: Secondary | ICD-10-CM | POA: Diagnosis not present

## 2022-12-06 ENCOUNTER — Other Ambulatory Visit (INDEPENDENT_AMBULATORY_CARE_PROVIDER_SITE_OTHER): Payer: Self-pay | Admitting: Gastroenterology

## 2022-12-06 ENCOUNTER — Telehealth (INDEPENDENT_AMBULATORY_CARE_PROVIDER_SITE_OTHER): Payer: Self-pay | Admitting: Gastroenterology

## 2022-12-21 ENCOUNTER — Ambulatory Visit: Payer: PPO | Attending: Internal Medicine | Admitting: Internal Medicine

## 2022-12-21 ENCOUNTER — Other Ambulatory Visit: Payer: Self-pay

## 2022-12-21 ENCOUNTER — Encounter: Payer: Self-pay | Admitting: Internal Medicine

## 2022-12-21 VITALS — BP 116/74 | HR 74 | Ht 60.0 in | Wt 153.4 lb

## 2022-12-21 DIAGNOSIS — I4891 Unspecified atrial fibrillation: Secondary | ICD-10-CM | POA: Diagnosis not present

## 2022-12-21 MED ORDER — ENTRESTO 24-26 MG PO TABS: 1.0000 | ORAL_TABLET | Freq: Two times a day (BID) | ORAL | 3 refills | Status: DC

## 2022-12-21 MED ORDER — FUROSEMIDE 40 MG PO TABS: 40.0000 mg | ORAL_TABLET | Freq: Every day | ORAL | 3 refills | Status: DC

## 2022-12-21 MED ORDER — CARVEDILOL 6.25 MG PO TABS: 6.2500 mg | ORAL_TABLET | Freq: Two times a day (BID) | ORAL | 3 refills | Status: DC

## 2022-12-21 NOTE — Progress Notes (Signed)
HPI [name] returns today for followup. She is a pleasant 89 yo woman with a h/o  HTN, chronic diastolic heart failure, and chronic atrial fib. She has class 2 symptoms. She has mild LV dysfunction. She takes her lasix 3 times a week or more but still complains about being unable to go out. She has not had syncope. No chest pain. She has mild class 2 peripheral edema. When I saw her last, she c/o worsening sob and I had her start entresto and lasix. We reduced her dose of coreg. Since then she notes she feels better. She has had some hemorrhoidal bleeding and stopped her eliquis about a month ago. Allergies  Allergen Reactions   Atorvastatin Other (See Comments)    Myalgias   Hydrocodone Other (See Comments)    GI distress.     Current Outpatient Medications  Medication Sig Dispense Refill   albuterol (VENTOLIN HFA) 108 (90 Base) MCG/ACT inhaler Inhale 2 puffs into the lungs every 4 (four) hours as needed for shortness of breath or wheezing.     apixaban (ELIQUIS) 2.5 MG TABS tablet Take 1 tablet (2.5 mg total) by mouth 2 (two) times daily. 60 tablet 5   carvedilol (COREG) 6.25 MG tablet Take 1 tablet (6.25 mg total) by mouth 2 (two) times daily. 180 tablet 3   dorzolamide-timolol (COSOPT) 22.3-6.8 MG/ML ophthalmic solution Place 1 drop into the right eye 2 (two) times daily.      furosemide (LASIX) 40 MG tablet TAKE ONE TABLET BY MOUTH ONCE DAILY. 30 tablet 0   hydrocortisone (ANUSOL-HC) 2.5 % rectal cream Place 1 application. rectally 3 (three) times daily. USE THREE TIMES A DAY X2 WEEKS THEN AS NEEDED 70 g 1   Latanoprostene Bunod 0.024 % SOLN Place 1 drop into the right eye at bedtime.      mirabegron ER (MYRBETRIQ) 25 MG TB24 tablet Take 1 tablet (25 mg total) by mouth daily. 30 tablet 0   Multiple Vitamins-Minerals (PRESERVISION AREDS 2 PO) Take 1 capsule by mouth in the morning and at bedtime.     pantoprazole (PROTONIX) 40 MG tablet TAKE ONE TABLET BY MOUTH DAILY BEFORE  BREAKFAST FOR ACID REFLUX. 90 tablet 0   pramoxine-hydrocortisone (PROCTOCREAM-HC) 1-1 % rectal cream Place 1 application rectally 2 (two) times daily as needed for hemorrhoids or anal itching. 30 g 1   RHOPRESSA 0.02 % SOLN Place 1 drop into both eyes at bedtime.     sacubitril-valsartan (ENTRESTO) 24-26 MG Take 1 tablet by mouth 2 (two) times daily. 60 tablet 11   No current facility-administered medications for this visit.     Past Medical History:  Diagnosis Date   Anxiety    Arteriosclerotic cardiovascular disease (ASCVD)     cath Feb '07- no obstructive dz - 20% proximal LAD only; EF 65%; possible coronary artery spasm   Atrial fibrillation (HCC)    Paroxysmal on event recorder in 2009; regular supraventricular tachycardia at a rate of 150 also identified on that study representing either atrial flutter or PSVT; onset of persistent AF in 03/2011   Carcinoma of breast (HCC) 2001   Left mastectomy with positive nodes   Chest pain    Long-standing and atypical   CHF (congestive heart failure) (HCC)    Chronic anticoagulation 04/12/2011   Dysrhythmia    GERD (gastroesophageal reflux disease)    hiatal hernia   Herpes zoster    Hyperlipidemia    Lipid profile in 11/2008:282, 176, 64,  201   Hypertension    diastolic dysfunction; normal CMet and TSH in 11/2009; normal CBC in 04/2010   Malignant neoplasm of breast (female), unspecified site 05/27/2013   right breast lumpectomy with positive node   Peripheral neuropathy    Polio 1946   at age 3   PONV (postoperative nausea and vomiting)    Rectal bleed    Renal insufficiency    Shortness of breath dyspnea    Vitreous hemorrhage of right eye (HCC) 10/23/2019    ROS:   All systems reviewed and negative except as noted in the HPI.   Past Surgical History:  Procedure Laterality Date   BREAST LUMPECTOMY WITH NEEDLE LOCALIZATION AND AXILLARY SENTINEL LYMPH NODE BX Right 05/27/2013   Procedure: BREAST LUMPECTOMY WITH NEEDLE  LOCALIZATION AND AXILLARY SENTINEL LYMPH NODE BX;  Surgeon: Valarie Merino, MD;  Location: [facility];  Service: General;  Laterality: Right;   BREAST SURGERY     CHOLECYSTECTOMY  1999   COLONOSCOPY  02/22/2011   Procedure: COLONOSCOPY;  Surgeon: Malissa Hippo, MD;  [facility];  Service: Endoscopy;  Laterality: N/A;; performed for scant hematochezia   DILATION AND CURETTAGE OF UTERUS     ESOPHAGOGASTRODUODENOSCOPY  02/22/2011   Procedure: ESOPHAGOGASTRODUODENOSCOPY (EGD);  Surgeon: Malissa Hippo, MD;  [facility];  Service: Endoscopy;  Laterality: N/A;   ESOPHAGOGASTRODUODENOSCOPY N/A 06/02/2014   Procedure: ESOPHAGOGASTRODUODENOSCOPY (EGD);  Surgeon: Malissa Hippo, MD;  [facility];  Service: Endoscopy;  Laterality: N/A;  730   EYE SURGERY     both cataracts   FLEXIBLE SIGMOIDOSCOPY N/A 01/24/2022   Procedure: FLEXIBLE SIGMOIDOSCOPY;  Surgeon: Dolores Frame, MD;  [facility];  Service: Gastroenterology;  Laterality: N/A;  245, moved up per Doree Fudge DILATION N/A 06/02/2014   Procedure: Elease Hashimoto DILATION;  Surgeon: Malissa Hippo, MD;  [facility];  Service: Endoscopy;  Laterality: N/A;   MASTECTOMY  2001   Left;modified radical with lymph node dissection   PORT-A-CATH REMOVAL     pac in and out 2002     Family History  Problem Relation Age of Onset   Cancer Sister        lung   Cancer Brother        lipoma   Cancer Brother        kidney     Social History   Socioeconomic History   Marital status: Widowed    Spouse name: Not on file   Number of children: 2   Years of education: Not on file   Highest education level: Not on file  Occupational History   Occupation: homemaker  Tobacco Use   Smoking status: Never    Passive exposure: Never   Smokeless tobacco: Never  Vaping Use   Vaping Use: Never used  Substance and Sexual Activity   Alcohol use: No    Alcohol/week:  0.0 standard drinks of alcohol   Drug use: No   Sexual activity: Not Currently    Birth control/protection: Post-menopausal  Other Topics Concern   Not on file  Social History Narrative   Not on file   Social Determinants of Health   Financial Resource Strain: Not on file  Food Insecurity: Not on file  Transportation Needs: Not on file  Physical Activity: Not on file  Stress: Not on file  Social Connections: Not on file  Intimate Partner Violence: Not on file  BP 116/74   Pulse 74   Ht 5' (1.524 m)   Wt 153 lb 6.4 oz (69.6 kg)   SpO2 93%   BMI 29.96 kg/m   Physical Exam:  Well appearing elderly woman, NAD HEENT: Unremarkable Neck:  No JVD, no thyromegally Lymphatics:  No adenopathy Back:  No CVA tenderness Lungs:  Clear with no wheezes HEART:  IRegular rate rhythm, no murmurs, no rubs, no clicks Abd:  soft, positive bowel sounds, no organomegally, no rebound, no guarding Ext:  2 plus pulses, no edema, no cyanosis, no clubbing Skin:  No rashes no nodules Neuro:  CN II through XII intact, motor grossly intact  EKG - atrial fib with a CVR   Assess/Plan:  1. Chronic diastolic heart failure - her symptoms are class 2 on optimal medical therapy. I encouraged her to avoid salty food and increase her physical activity. I have asked her to continue entresto 24/26. Also she will take her lasix daily, and continue the coreg at 6.25 bid.   2. Perm atrial fib - her VR is well controlled. She denies palpitations.   3. HTN - her bp is well controlled. No change in meds.   4. Obesity - she has continued to lose weight. 5. Bleeding -I asked her to restart eliquis 2.5 bid.   Sharlot Gowda Eilidh Marcano,MD

## 2022-12-21 NOTE — Patient Instructions (Signed)
Medication Instructions:  Your physician recommends that you continue on your current medications as directed. Please refer to the Current Medication list given to you today.  *If you need a refill on your cardiac medications before your next appointment, please call your pharmacy*   Lab Work: NONE   If you have labs (blood work) drawn today and your tests are completely normal, you will receive your results only by: MyChart Message (if you have MyChart) OR A paper copy in the mail If you have any lab test that is abnormal or we need to change your treatment, we will call you to review the results.   Testing/Procedures: NONE    Follow-Up: At [facility], you and your health needs are our priority.  As part of our continuing mission to provide you with exceptional heart care, we have created designated Provider Care Teams.  These Care Teams include your primary Cardiologist (physician) and Advanced Practice Providers (APPs -  Physician Assistants and Nurse Practitioners) who all work together to provide you with the care you need, when you need it.  We recommend signing up for the patient portal called "MyChart".  Sign up information is provided on this After Visit Summary.  MyChart is used to connect with patients for Virtual Visits (Telemedicine).  Patients are able to view lab/test results, encounter notes, upcoming appointments, etc.  Non-urgent messages can be sent to your provider as well.   To learn more about what you can do with MyChart, go to [email or web address].    Your next appointment:   1 year(s)  Provider:   Gregg Taylor, MD    Other Instructions Thank you for choosing [facility]!    

## 2022-12-28 DIAGNOSIS — H35721 Serous detachment of retinal pigment epithelium, right eye: Secondary | ICD-10-CM | POA: Diagnosis not present

## 2022-12-28 DIAGNOSIS — H43812 Vitreous degeneration, left eye: Secondary | ICD-10-CM | POA: Diagnosis not present

## 2022-12-28 DIAGNOSIS — H353231 Exudative age-related macular degeneration, bilateral, with active choroidal neovascularization: Secondary | ICD-10-CM | POA: Diagnosis not present

## 2022-12-28 DIAGNOSIS — H353124 Nonexudative age-related macular degeneration, left eye, advanced atrophic with subfoveal involvement: Secondary | ICD-10-CM | POA: Diagnosis not present

## 2022-12-28 DIAGNOSIS — H353113 Nonexudative age-related macular degeneration, right eye, advanced atrophic without subfoveal involvement: Secondary | ICD-10-CM | POA: Diagnosis not present

## 2023-01-08 DIAGNOSIS — I5032 Chronic diastolic (congestive) heart failure: Secondary | ICD-10-CM | POA: Diagnosis not present

## 2023-01-08 DIAGNOSIS — I4821 Permanent atrial fibrillation: Secondary | ICD-10-CM | POA: Diagnosis not present

## 2023-01-09 ENCOUNTER — Ambulatory Visit (INDEPENDENT_AMBULATORY_CARE_PROVIDER_SITE_OTHER): Payer: PPO | Admitting: Gastroenterology

## 2023-01-23 DIAGNOSIS — H353231 Exudative age-related macular degeneration, bilateral, with active choroidal neovascularization: Secondary | ICD-10-CM | POA: Diagnosis not present

## 2023-01-23 DIAGNOSIS — H401112 Primary open-angle glaucoma, right eye, moderate stage: Secondary | ICD-10-CM | POA: Diagnosis not present

## 2023-02-19 DIAGNOSIS — Z79899 Other long term (current) drug therapy: Secondary | ICD-10-CM | POA: Diagnosis not present

## 2023-02-19 DIAGNOSIS — L820 Inflamed seborrheic keratosis: Secondary | ICD-10-CM | POA: Diagnosis not present

## 2023-02-19 DIAGNOSIS — I5032 Chronic diastolic (congestive) heart failure: Secondary | ICD-10-CM | POA: Diagnosis not present

## 2023-02-19 DIAGNOSIS — L570 Actinic keratosis: Secondary | ICD-10-CM | POA: Diagnosis not present

## 2023-02-19 DIAGNOSIS — I4821 Permanent atrial fibrillation: Secondary | ICD-10-CM | POA: Diagnosis not present

## 2023-02-19 DIAGNOSIS — X32XXXD Exposure to sunlight, subsequent encounter: Secondary | ICD-10-CM | POA: Diagnosis not present

## 2023-02-26 ENCOUNTER — Other Ambulatory Visit (INDEPENDENT_AMBULATORY_CARE_PROVIDER_SITE_OTHER): Payer: Self-pay | Admitting: Gastroenterology

## 2023-02-26 DIAGNOSIS — D509 Iron deficiency anemia, unspecified: Secondary | ICD-10-CM | POA: Diagnosis not present

## 2023-03-13 DIAGNOSIS — D509 Iron deficiency anemia, unspecified: Secondary | ICD-10-CM | POA: Diagnosis not present

## 2023-03-15 DIAGNOSIS — H401130 Primary open-angle glaucoma, bilateral, stage unspecified: Secondary | ICD-10-CM | POA: Diagnosis not present

## 2023-03-15 DIAGNOSIS — H43812 Vitreous degeneration, left eye: Secondary | ICD-10-CM | POA: Diagnosis not present

## 2023-03-15 DIAGNOSIS — H353221 Exudative age-related macular degeneration, left eye, with active choroidal neovascularization: Secondary | ICD-10-CM | POA: Diagnosis not present

## 2023-03-15 DIAGNOSIS — H353211 Exudative age-related macular degeneration, right eye, with active choroidal neovascularization: Secondary | ICD-10-CM | POA: Diagnosis not present

## 2023-03-15 DIAGNOSIS — H35721 Serous detachment of retinal pigment epithelium, right eye: Secondary | ICD-10-CM | POA: Diagnosis not present

## 2023-03-15 DIAGNOSIS — H353231 Exudative age-related macular degeneration, bilateral, with active choroidal neovascularization: Secondary | ICD-10-CM | POA: Diagnosis not present

## 2023-03-15 DIAGNOSIS — H353113 Nonexudative age-related macular degeneration, right eye, advanced atrophic without subfoveal involvement: Secondary | ICD-10-CM | POA: Diagnosis not present

## 2023-03-15 DIAGNOSIS — H353124 Nonexudative age-related macular degeneration, left eye, advanced atrophic with subfoveal involvement: Secondary | ICD-10-CM | POA: Diagnosis not present

## 2023-03-16 DIAGNOSIS — D509 Iron deficiency anemia, unspecified: Secondary | ICD-10-CM | POA: Diagnosis not present

## 2023-03-16 DIAGNOSIS — U071 COVID-19: Secondary | ICD-10-CM | POA: Diagnosis not present

## 2023-04-05 DIAGNOSIS — D509 Iron deficiency anemia, unspecified: Secondary | ICD-10-CM | POA: Diagnosis not present

## 2023-04-13 DIAGNOSIS — I4821 Permanent atrial fibrillation: Secondary | ICD-10-CM | POA: Diagnosis not present

## 2023-04-13 DIAGNOSIS — D509 Iron deficiency anemia, unspecified: Secondary | ICD-10-CM | POA: Diagnosis not present

## 2023-04-13 DIAGNOSIS — R051 Acute cough: Secondary | ICD-10-CM | POA: Diagnosis not present

## 2023-05-14 DIAGNOSIS — D509 Iron deficiency anemia, unspecified: Secondary | ICD-10-CM | POA: Diagnosis not present

## 2023-05-14 DIAGNOSIS — Z79899 Other long term (current) drug therapy: Secondary | ICD-10-CM | POA: Diagnosis not present

## 2023-05-14 DIAGNOSIS — I5022 Chronic systolic (congestive) heart failure: Secondary | ICD-10-CM | POA: Diagnosis not present

## 2023-05-14 DIAGNOSIS — I4821 Permanent atrial fibrillation: Secondary | ICD-10-CM | POA: Diagnosis not present

## 2023-05-24 DIAGNOSIS — H401130 Primary open-angle glaucoma, bilateral, stage unspecified: Secondary | ICD-10-CM | POA: Diagnosis not present

## 2023-05-24 DIAGNOSIS — H353124 Nonexudative age-related macular degeneration, left eye, advanced atrophic with subfoveal involvement: Secondary | ICD-10-CM | POA: Diagnosis not present

## 2023-05-24 DIAGNOSIS — H353231 Exudative age-related macular degeneration, bilateral, with active choroidal neovascularization: Secondary | ICD-10-CM | POA: Diagnosis not present

## 2023-05-24 DIAGNOSIS — H35721 Serous detachment of retinal pigment epithelium, right eye: Secondary | ICD-10-CM | POA: Diagnosis not present

## 2023-05-24 DIAGNOSIS — H353113 Nonexudative age-related macular degeneration, right eye, advanced atrophic without subfoveal involvement: Secondary | ICD-10-CM | POA: Diagnosis not present

## 2023-05-24 DIAGNOSIS — H43812 Vitreous degeneration, left eye: Secondary | ICD-10-CM | POA: Diagnosis not present

## 2023-07-24 DIAGNOSIS — H401112 Primary open-angle glaucoma, right eye, moderate stage: Secondary | ICD-10-CM | POA: Diagnosis not present

## 2023-07-24 DIAGNOSIS — H353231 Exudative age-related macular degeneration, bilateral, with active choroidal neovascularization: Secondary | ICD-10-CM | POA: Diagnosis not present

## 2023-07-25 DIAGNOSIS — N1832 Chronic kidney disease, stage 3b: Secondary | ICD-10-CM | POA: Diagnosis not present

## 2023-07-25 DIAGNOSIS — I5032 Chronic diastolic (congestive) heart failure: Secondary | ICD-10-CM | POA: Diagnosis not present

## 2023-07-25 DIAGNOSIS — D509 Iron deficiency anemia, unspecified: Secondary | ICD-10-CM | POA: Diagnosis not present

## 2023-07-25 DIAGNOSIS — I4821 Permanent atrial fibrillation: Secondary | ICD-10-CM | POA: Diagnosis not present

## 2023-08-23 DIAGNOSIS — H401130 Primary open-angle glaucoma, bilateral, stage unspecified: Secondary | ICD-10-CM | POA: Diagnosis not present

## 2023-08-23 DIAGNOSIS — H353113 Nonexudative age-related macular degeneration, right eye, advanced atrophic without subfoveal involvement: Secondary | ICD-10-CM | POA: Diagnosis not present

## 2023-08-23 DIAGNOSIS — H353124 Nonexudative age-related macular degeneration, left eye, advanced atrophic with subfoveal involvement: Secondary | ICD-10-CM | POA: Diagnosis not present

## 2023-08-23 DIAGNOSIS — H35721 Serous detachment of retinal pigment epithelium, right eye: Secondary | ICD-10-CM | POA: Diagnosis not present

## 2023-08-23 DIAGNOSIS — H43812 Vitreous degeneration, left eye: Secondary | ICD-10-CM | POA: Diagnosis not present

## 2023-08-23 DIAGNOSIS — H353231 Exudative age-related macular degeneration, bilateral, with active choroidal neovascularization: Secondary | ICD-10-CM | POA: Diagnosis not present

## 2023-09-06 ENCOUNTER — Other Ambulatory Visit: Payer: Self-pay | Admitting: Internal Medicine

## 2023-09-06 NOTE — Telephone Encounter (Signed)
 This is a [place] pt.

## 2023-10-05 DIAGNOSIS — C50919 Malignant neoplasm of unspecified site of unspecified female breast: Secondary | ICD-10-CM | POA: Diagnosis not present

## 2023-10-11 DIAGNOSIS — C50919 Malignant neoplasm of unspecified site of unspecified female breast: Secondary | ICD-10-CM | POA: Diagnosis not present

## 2023-10-18 DIAGNOSIS — D509 Iron deficiency anemia, unspecified: Secondary | ICD-10-CM | POA: Diagnosis not present

## 2023-10-18 DIAGNOSIS — I4821 Permanent atrial fibrillation: Secondary | ICD-10-CM | POA: Diagnosis not present

## 2023-10-18 DIAGNOSIS — I5032 Chronic diastolic (congestive) heart failure: Secondary | ICD-10-CM | POA: Diagnosis not present

## 2023-10-18 DIAGNOSIS — Z79899 Other long term (current) drug therapy: Secondary | ICD-10-CM | POA: Diagnosis not present

## 2023-10-18 DIAGNOSIS — N1832 Chronic kidney disease, stage 3b: Secondary | ICD-10-CM | POA: Diagnosis not present

## 2023-10-25 DIAGNOSIS — N1832 Chronic kidney disease, stage 3b: Secondary | ICD-10-CM | POA: Diagnosis not present

## 2023-10-25 DIAGNOSIS — D509 Iron deficiency anemia, unspecified: Secondary | ICD-10-CM | POA: Diagnosis not present

## 2023-10-25 DIAGNOSIS — I5032 Chronic diastolic (congestive) heart failure: Secondary | ICD-10-CM | POA: Diagnosis not present

## 2023-10-30 DIAGNOSIS — H401112 Primary open-angle glaucoma, right eye, moderate stage: Secondary | ICD-10-CM | POA: Diagnosis not present

## 2023-10-30 DIAGNOSIS — H353231 Exudative age-related macular degeneration, bilateral, with active choroidal neovascularization: Secondary | ICD-10-CM | POA: Diagnosis not present

## 2023-11-20 DIAGNOSIS — H353124 Nonexudative age-related macular degeneration, left eye, advanced atrophic with subfoveal involvement: Secondary | ICD-10-CM | POA: Diagnosis not present

## 2023-11-20 DIAGNOSIS — H35721 Serous detachment of retinal pigment epithelium, right eye: Secondary | ICD-10-CM | POA: Diagnosis not present

## 2023-11-20 DIAGNOSIS — H353113 Nonexudative age-related macular degeneration, right eye, advanced atrophic without subfoveal involvement: Secondary | ICD-10-CM | POA: Diagnosis not present

## 2023-11-20 DIAGNOSIS — H353231 Exudative age-related macular degeneration, bilateral, with active choroidal neovascularization: Secondary | ICD-10-CM | POA: Diagnosis not present

## 2023-11-20 DIAGNOSIS — H401130 Primary open-angle glaucoma, bilateral, stage unspecified: Secondary | ICD-10-CM | POA: Diagnosis not present

## 2023-11-20 DIAGNOSIS — H43812 Vitreous degeneration, left eye: Secondary | ICD-10-CM | POA: Diagnosis not present

## 2023-12-10 ENCOUNTER — Other Ambulatory Visit: Payer: Self-pay | Admitting: Internal Medicine

## 2024-01-03 ENCOUNTER — Other Ambulatory Visit: Payer: Self-pay | Admitting: Internal Medicine

## 2024-01-03 ENCOUNTER — Ambulatory Visit: Payer: PPO | Attending: Internal Medicine | Admitting: Internal Medicine

## 2024-01-03 ENCOUNTER — Encounter: Payer: Self-pay | Admitting: Internal Medicine

## 2024-01-03 VITALS — BP 116/58 | HR 73 | Ht 60.0 in | Wt 136.6 lb

## 2024-01-03 DIAGNOSIS — R9431 Abnormal electrocardiogram [ECG] [EKG]: Secondary | ICD-10-CM | POA: Diagnosis not present

## 2024-01-03 DIAGNOSIS — I4891 Unspecified atrial fibrillation: Secondary | ICD-10-CM

## 2024-01-03 NOTE — Patient Instructions (Signed)
 Medication Instructions:  Your physician recommends that you continue on your current medications as directed. Please refer to the Current Medication list given to you today.  *If you need a refill on your cardiac medications before your next appointment, please call your pharmacy*  Lab Work: NONE   If you have labs (blood work) drawn today and your tests are completely normal, you will receive your results only by: MyChart Message (if you have MyChart) OR A paper copy in the mail If you have any lab test that is abnormal or we need to change your treatment, we will call you to review the results.  Testing/Procedures: NONE   Follow-Up: At [facility], you and your health needs are our priority.  As part of our continuing mission to provide you with exceptional heart care, our providers are all part of one team.  This team includes your primary Cardiologist (physician) and Advanced Practice Providers or APPs (Physician Assistants and Nurse Practitioners) who all work together to provide you with the care you need, when you need it.  Your next appointment:   1 year(s)  Provider:   [place]     We recommend signing up for the patient portal called MyChart.  Sign up information is provided on this After Visit Summary.  MyChart is used to connect with patients for Virtual Visits (Telemedicine).  Patients are able to view lab/test results, encounter notes, upcoming appointments, etc.  Non-urgent messages can be sent to your provider as well.   To learn more about what you can do with MyChart, go to [email or web address].   Other Instructions Thank you for choosing [facility]!

## 2024-01-03 NOTE — Progress Notes (Signed)
 HPI [name] returns today for followup. She is a pleasant almost 89 yo woman with a h/o  HTN, chronic diastolic heart failure, and chronic atrial fib. She has class 2 symptoms. She has mild LV dysfunction. She takes her lasix  3 times a week or more but still complains about being unable to go out as she gets sob quickly with walking. She is about to have a 89 yo birthday party. She has not had syncope. No chest pain. She has mild class 2 peripheral edema. When I saw her last, she c/o worsening sob and I had her start entresto  and lasix . She is better. She has had some hemorrhoidal bleeding and some pain. No more bleeding.  Allergies  Allergen Reactions   Atorvastatin Other (See Comments)    Myalgias   Hydrocodone Other (See Comments)    GI distress.     Current Outpatient Medications  Medication Sig Dispense Refill   albuterol  (VENTOLIN  HFA) 108 (90 Base) MCG/ACT inhaler Inhale 2 puffs into the lungs every 4 (four) hours as needed for shortness of breath or wheezing.     apixaban  (ELIQUIS ) 2.5 MG TABS tablet Take 1 tablet (2.5 mg total) by mouth 2 (two) times daily. 60 tablet 5   carvedilol  (COREG ) 6.25 MG tablet TAKE (1) TABLET BY MOUTH TWICE DAILY. 180 tablet 3   dorzolamide-timolol  (COSOPT) 22.3-6.8 MG/ML ophthalmic solution Place 1 drop into the right eye 2 (two) times daily.      furosemide  (LASIX ) 40 MG tablet TAKE ONE TABLET BY MOUTH EVERY DAY 90 tablet 0   hydrocortisone  (ANUSOL -HC) 2.5 % rectal cream Place 1 application. rectally 3 (three) times daily. USE THREE TIMES A DAY X2 WEEKS THEN AS NEEDED 70 g 1   Latanoprostene Bunod  0.024 % SOLN Place 1 drop into the right eye at bedtime.      mirabegron  ER (MYRBETRIQ ) 25 MG TB24 tablet Take 1 tablet (25 mg total) by mouth daily. 30 tablet 0   Multiple Vitamins-Minerals (PRESERVISION AREDS 2 PO) Take 1 capsule by mouth in the morning and at bedtime.     pantoprazole  (PROTONIX ) 40 MG tablet TAKE ONE TABLET BY MOUTH DAILY BEFORE  BREAKFAST FOR ACID REFLUX. 90 tablet 0   pramoxine-hydrocortisone  (PROCTOCREAM-HC) 1-1 % rectal cream Place 1 application rectally 2 (two) times daily as needed for hemorrhoids or anal itching. 30 g 1   RHOPRESSA 0.02 % SOLN Place 1 drop into both eyes at bedtime.     sacubitril-valsartan (ENTRESTO ) 24-26 MG Take 1 tablet by mouth 2 (two) times daily. 180 tablet 3   No current facility-administered medications for this visit.     Past Medical History:  Diagnosis Date   Anxiety    Arteriosclerotic cardiovascular disease (ASCVD)     cath Feb '07- no obstructive dz - 20% proximal LAD only; EF 65%; possible coronary artery spasm   Atrial fibrillation (HCC)    Paroxysmal on event recorder in 2009; regular supraventricular tachycardia at a rate of 150 also identified on that study representing either atrial flutter or PSVT; onset of persistent AF in 03/2011   Carcinoma of breast (HCC) 2001   Left mastectomy with positive nodes   Chest pain    Long-standing and atypical   CHF (congestive heart failure) (HCC)    Chronic anticoagulation 04/12/2011   Dysrhythmia    GERD (gastroesophageal reflux disease)    hiatal hernia   Herpes zoster    Hyperlipidemia    Lipid profile in 11/2008:282,  176, 64, 201   Hypertension    diastolic dysfunction; normal CMet and TSH in 11/2009; normal CBC in 04/2010   Malignant neoplasm of breast (female), unspecified site 05/27/2013   right breast lumpectomy with positive node   Peripheral neuropathy    Polio 1946   at age 60   PONV (postoperative nausea and vomiting)    Rectal bleed    Renal insufficiency    Shortness of breath dyspnea    Vitreous hemorrhage of right eye (HCC) 10/23/2019    ROS:   All systems reviewed and negative except as noted in the HPI.   Past Surgical History:  Procedure Laterality Date   BREAST LUMPECTOMY WITH NEEDLE LOCALIZATION AND AXILLARY SENTINEL LYMPH NODE BX Right 05/27/2013   Procedure: BREAST LUMPECTOMY WITH NEEDLE  LOCALIZATION AND AXILLARY SENTINEL LYMPH NODE BX;  Surgeon: Donnice KATHEE Lunger, MD;  Location: [facility];  Service: General;  Laterality: Right;   BREAST SURGERY     CHOLECYSTECTOMY  1999   COLONOSCOPY  02/22/2011   Procedure: COLONOSCOPY;  Surgeon: Claudis RAYMOND Rivet, MD;  [facility];  Service: Endoscopy;  Laterality: N/A;; performed for scant hematochezia   DILATION AND CURETTAGE OF UTERUS     ESOPHAGOGASTRODUODENOSCOPY  02/22/2011   Procedure: ESOPHAGOGASTRODUODENOSCOPY (EGD);  Surgeon: Claudis RAYMOND Rivet, MD;  [facility];  Service: Endoscopy;  Laterality: N/A;   ESOPHAGOGASTRODUODENOSCOPY N/A 06/02/2014   Procedure: ESOPHAGOGASTRODUODENOSCOPY (EGD);  Surgeon: Claudis RAYMOND Rivet, MD;  [facility];  Service: Endoscopy;  Laterality: N/A;  730   EYE SURGERY     both cataracts   FLEXIBLE SIGMOIDOSCOPY N/A 01/24/2022   Procedure: FLEXIBLE SIGMOIDOSCOPY;  Surgeon: Eartha Angelia Sieving, MD;  [facility];  Service: Gastroenterology;  Laterality: N/A;  245, moved up per Anette Jenkins STAI DILATION N/A 06/02/2014   Procedure: STAI DILATION;  Surgeon: Claudis RAYMOND Rivet, MD;  [facility];  Service: Endoscopy;  Laterality: N/A;   MASTECTOMY  2001   Left;modified radical with lymph node dissection   PORT-A-CATH REMOVAL     pac in and out 2002     Family History  Problem Relation Age of Onset   Cancer Sister        lung   Cancer Brother        lipoma   Cancer Brother        kidney     Social History   Socioeconomic History   Marital status: Widowed    Spouse name: Not on file   Number of children: 2   Years of education: Not on file   Highest education level: Not on file  Occupational History   Occupation: homemaker  Tobacco Use   Smoking status: Never    Passive exposure: Never   Smokeless tobacco: Never  Vaping Use   Vaping status: Never Used  Substance and Sexual Activity   Alcohol use: No     Alcohol/week: 0.0 standard drinks of alcohol   Drug use: No   Sexual activity: Not Currently    Birth control/protection: Post-menopausal  Other Topics Concern   Not on file  Social History Narrative   Not on file   Social Drivers of Health   Financial Resource Strain: Not on file  Food Insecurity: Not on file  Transportation Needs: Not on file  Physical Activity: Not on file  Stress: Not on file  Social Connections: Not on file  Intimate Partner Violence: Not on file  BP (!) 116/58 (BP Location: Right Arm, Patient Position: Sitting, Cuff Size: Normal)   Pulse 73   Ht 5' (1.524 m)   Wt 136 lb 9.6 oz (62 kg)   SpO2 95%   BMI 26.68 kg/m   Physical Exam:  Well appearing NAD HEENT: Unremarkable Neck:  No JVD, no thyromegally Lymphatics:  No adenopathy Back:  No CVA tenderness Lungs:  Clear HEART:  Regular rate rhythm, no murmurs, no rubs, no clicks Abd:  soft, positive bowel sounds, no organomegally, no rebound, no guarding Ext:  2 plus pulses, no edema, no cyanosis, no clubbing Skin:  No rashes no nodules Neuro:  CN II through XII intact, motor grossly intact  EKG - afib with RBBB   Assess/Plan:  Chronic diastolic heart failure - her symptoms are class 2 on optimal medical therapy. I encouraged her to avoid salty food and increase her physical activity. I have asked her to continue entresto  24/26. Also she will take her lasix  daily, and continue the coreg  at 6.25 bid.   2. Perm atrial fib - her VR is well controlled. She denies palpitations.   3. HTN - her bp is well controlled. No change in meds.   4. Bleeding - she has had none despite going back on eliquis .   Danelle Waddell COME

## 2024-01-24 DIAGNOSIS — I4821 Permanent atrial fibrillation: Secondary | ICD-10-CM | POA: Diagnosis not present

## 2024-01-24 DIAGNOSIS — Z79899 Other long term (current) drug therapy: Secondary | ICD-10-CM | POA: Diagnosis not present

## 2024-01-24 DIAGNOSIS — D509 Iron deficiency anemia, unspecified: Secondary | ICD-10-CM | POA: Diagnosis not present

## 2024-01-24 DIAGNOSIS — N1832 Chronic kidney disease, stage 3b: Secondary | ICD-10-CM | POA: Diagnosis not present

## 2024-01-24 DIAGNOSIS — I5032 Chronic diastolic (congestive) heart failure: Secondary | ICD-10-CM | POA: Diagnosis not present

## 2024-01-29 DIAGNOSIS — H401112 Primary open-angle glaucoma, right eye, moderate stage: Secondary | ICD-10-CM | POA: Diagnosis not present

## 2024-01-31 DIAGNOSIS — I4821 Permanent atrial fibrillation: Secondary | ICD-10-CM | POA: Diagnosis not present

## 2024-01-31 DIAGNOSIS — N1832 Chronic kidney disease, stage 3b: Secondary | ICD-10-CM | POA: Diagnosis not present

## 2024-01-31 DIAGNOSIS — I5032 Chronic diastolic (congestive) heart failure: Secondary | ICD-10-CM | POA: Diagnosis not present

## 2024-01-31 DIAGNOSIS — D649 Anemia, unspecified: Secondary | ICD-10-CM | POA: Diagnosis not present

## 2024-02-14 DIAGNOSIS — L570 Actinic keratosis: Secondary | ICD-10-CM | POA: Diagnosis not present

## 2024-02-14 DIAGNOSIS — X32XXXD Exposure to sunlight, subsequent encounter: Secondary | ICD-10-CM | POA: Diagnosis not present

## 2024-02-19 DIAGNOSIS — H43812 Vitreous degeneration, left eye: Secondary | ICD-10-CM | POA: Diagnosis not present

## 2024-02-19 DIAGNOSIS — H35721 Serous detachment of retinal pigment epithelium, right eye: Secondary | ICD-10-CM | POA: Diagnosis not present

## 2024-02-19 DIAGNOSIS — H353124 Nonexudative age-related macular degeneration, left eye, advanced atrophic with subfoveal involvement: Secondary | ICD-10-CM | POA: Diagnosis not present

## 2024-02-19 DIAGNOSIS — H353113 Nonexudative age-related macular degeneration, right eye, advanced atrophic without subfoveal involvement: Secondary | ICD-10-CM | POA: Diagnosis not present

## 2024-02-19 DIAGNOSIS — H353231 Exudative age-related macular degeneration, bilateral, with active choroidal neovascularization: Secondary | ICD-10-CM | POA: Diagnosis not present

## 2024-03-01 ENCOUNTER — Other Ambulatory Visit (INDEPENDENT_AMBULATORY_CARE_PROVIDER_SITE_OTHER): Payer: Self-pay | Admitting: Gastroenterology

## 2024-03-01 ENCOUNTER — Other Ambulatory Visit: Payer: Self-pay | Admitting: Internal Medicine

## 2024-03-03 NOTE — Telephone Encounter (Signed)
 Last seen 2023. Needs office visit for further refills.

## 2024-04-21 DIAGNOSIS — X32XXXD Exposure to sunlight, subsequent encounter: Secondary | ICD-10-CM | POA: Diagnosis not present

## 2024-04-21 DIAGNOSIS — S51812A Laceration without foreign body of left forearm, initial encounter: Secondary | ICD-10-CM | POA: Diagnosis not present

## 2024-04-21 DIAGNOSIS — L570 Actinic keratosis: Secondary | ICD-10-CM | POA: Diagnosis not present

## 2024-04-24 DIAGNOSIS — N1832 Chronic kidney disease, stage 3b: Secondary | ICD-10-CM | POA: Diagnosis not present

## 2024-04-24 DIAGNOSIS — I5032 Chronic diastolic (congestive) heart failure: Secondary | ICD-10-CM | POA: Diagnosis not present

## 2024-04-24 DIAGNOSIS — Z79899 Other long term (current) drug therapy: Secondary | ICD-10-CM | POA: Diagnosis not present

## 2024-05-01 DIAGNOSIS — N1832 Chronic kidney disease, stage 3b: Secondary | ICD-10-CM | POA: Diagnosis not present

## 2024-05-01 DIAGNOSIS — I5032 Chronic diastolic (congestive) heart failure: Secondary | ICD-10-CM | POA: Diagnosis not present

## 2024-05-01 DIAGNOSIS — D649 Anemia, unspecified: Secondary | ICD-10-CM | POA: Diagnosis not present

## 2024-05-20 DIAGNOSIS — H353231 Exudative age-related macular degeneration, bilateral, with active choroidal neovascularization: Secondary | ICD-10-CM | POA: Diagnosis not present

## 2024-05-20 DIAGNOSIS — H35721 Serous detachment of retinal pigment epithelium, right eye: Secondary | ICD-10-CM | POA: Diagnosis not present

## 2024-05-20 DIAGNOSIS — H353113 Nonexudative age-related macular degeneration, right eye, advanced atrophic without subfoveal involvement: Secondary | ICD-10-CM | POA: Diagnosis not present

## 2024-05-20 DIAGNOSIS — H43812 Vitreous degeneration, left eye: Secondary | ICD-10-CM | POA: Diagnosis not present

## 2024-05-20 DIAGNOSIS — H401130 Primary open-angle glaucoma, bilateral, stage unspecified: Secondary | ICD-10-CM | POA: Diagnosis not present

## 2024-05-20 DIAGNOSIS — H353124 Nonexudative age-related macular degeneration, left eye, advanced atrophic with subfoveal involvement: Secondary | ICD-10-CM | POA: Diagnosis not present
